# Patient Record
Sex: Male | Born: 1980
Health system: Southern US, Community
[De-identification: ages and names within clinical notes are randomized; demographics above are authoritative.]

## PROBLEM LIST (undated history)

## (undated) DIAGNOSIS — F909 Attention-deficit hyperactivity disorder, unspecified type: Secondary | ICD-10-CM

## (undated) DIAGNOSIS — E785 Hyperlipidemia, unspecified: Secondary | ICD-10-CM

## (undated) DIAGNOSIS — F410 Panic disorder [episodic paroxysmal anxiety] without agoraphobia: Secondary | ICD-10-CM

## (undated) DIAGNOSIS — I83813 Varicose veins of bilateral lower extremities with pain: Secondary | ICD-10-CM

## (undated) DIAGNOSIS — F32A Depression, unspecified: Secondary | ICD-10-CM

## (undated) DIAGNOSIS — E119 Type 2 diabetes mellitus without complications: Secondary | ICD-10-CM

## (undated) DIAGNOSIS — E669 Obesity, unspecified: Secondary | ICD-10-CM

## (undated) DIAGNOSIS — F319 Bipolar disorder, unspecified: Secondary | ICD-10-CM

## (undated) HISTORY — DX: Depression, unspecified: F32.A

## (undated) HISTORY — DX: Varicose veins of bilateral lower extremities with pain: I83.813

## (undated) HISTORY — DX: Type 2 diabetes mellitus without complications: E11.9

## (undated) HISTORY — DX: Hyperlipidemia, unspecified: E78.5

## (undated) HISTORY — PX: OTHER SURGICAL HISTORY: SHX169

---
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
## 1999-01-04 ENCOUNTER — Encounter: Admission: RE | Admit: 1999-01-04 | Discharge: 1999-04-04 | Payer: Self-pay

## 2001-07-16 ENCOUNTER — Emergency Department (HOSPITAL_COMMUNITY): Admission: EM | Admit: 2001-07-16 | Discharge: 2001-07-16 | Payer: Self-pay | Admitting: Emergency Medicine

## 2001-07-19 ENCOUNTER — Emergency Department (HOSPITAL_COMMUNITY): Admission: EM | Admit: 2001-07-19 | Discharge: 2001-07-19 | Payer: Self-pay | Admitting: Emergency Medicine

## 2002-05-29 ENCOUNTER — Emergency Department (HOSPITAL_COMMUNITY): Admission: EM | Admit: 2002-05-29 | Discharge: 2002-05-29 | Payer: Self-pay | Admitting: Emergency Medicine

## 2003-02-08 ENCOUNTER — Emergency Department (HOSPITAL_COMMUNITY): Admission: EM | Admit: 2003-02-08 | Discharge: 2003-02-08 | Payer: Self-pay | Admitting: Emergency Medicine

## 2003-06-30 ENCOUNTER — Emergency Department (HOSPITAL_COMMUNITY): Admission: EM | Admit: 2003-06-30 | Discharge: 2003-06-30 | Payer: Self-pay | Admitting: Emergency Medicine

## 2003-07-28 ENCOUNTER — Emergency Department (HOSPITAL_COMMUNITY): Admission: EM | Admit: 2003-07-28 | Discharge: 2003-07-28 | Payer: Self-pay | Admitting: Emergency Medicine

## 2003-09-18 ENCOUNTER — Emergency Department (HOSPITAL_COMMUNITY): Admission: AD | Admit: 2003-09-18 | Discharge: 2003-09-18 | Payer: Self-pay | Admitting: Family Medicine

## 2003-12-07 ENCOUNTER — Emergency Department (HOSPITAL_COMMUNITY): Admission: EM | Admit: 2003-12-07 | Discharge: 2003-12-07 | Payer: Self-pay | Admitting: Family Medicine

## 2004-12-03 ENCOUNTER — Emergency Department (HOSPITAL_COMMUNITY): Admission: EM | Admit: 2004-12-03 | Discharge: 2004-12-03 | Payer: Self-pay | Admitting: Family Medicine

## 2005-02-12 ENCOUNTER — Emergency Department (HOSPITAL_COMMUNITY): Admission: EM | Admit: 2005-02-12 | Discharge: 2005-02-12 | Payer: Self-pay | Admitting: Emergency Medicine

## 2006-05-31 ENCOUNTER — Emergency Department (HOSPITAL_COMMUNITY): Admission: EM | Admit: 2006-05-31 | Discharge: 2006-05-31 | Payer: Self-pay | Admitting: Emergency Medicine

## 2007-08-11 ENCOUNTER — Emergency Department (HOSPITAL_COMMUNITY): Admission: EM | Admit: 2007-08-11 | Discharge: 2007-08-11 | Payer: Self-pay | Admitting: Emergency Medicine

## 2010-02-02 ENCOUNTER — Emergency Department (HOSPITAL_COMMUNITY): Admission: EM | Admit: 2010-02-02 | Discharge: 2010-02-02 | Payer: Self-pay | Admitting: Emergency Medicine

## 2011-08-06 ENCOUNTER — Emergency Department (INDEPENDENT_AMBULATORY_CARE_PROVIDER_SITE_OTHER)
Admission: EM | Admit: 2011-08-06 | Discharge: 2011-08-06 | Disposition: A | Discharge status: Home / Self Care | Payer: Medicare Other | Source: Home / Self Care | Attending: Family Medicine | Admitting: Family Medicine

## 2011-08-06 DIAGNOSIS — M533 Sacrococcygeal disorders, not elsewhere classified: Secondary | ICD-10-CM

## 2011-08-06 DIAGNOSIS — S90129A Contusion of unspecified lesser toe(s) without damage to nail, initial encounter: Secondary | ICD-10-CM

## 2011-08-06 MED ORDER — PREDNISONE (PAK) 10 MG PO TABS: ORAL_TABLET | ORAL | Status: AC

## 2011-08-06 NOTE — ED Notes (Signed)
States he accidentally struck his 5th toe on furniture about 10 days ago, and it is not improving in spite of his home care efforts; also c/o pain in back for past yr after trying to lift a chair, flare up past 6 months

## 2011-08-06 NOTE — ED Provider Notes (Signed)
History     CSN: [id number] Arrival date & time: 08/06/2011  6:12 PM   First MD Initiated Contact with Patient 08/06/11 1846      Chief Complaint  Patient presents with  . Foot Pain    (Consider location/radiation/quality/duration/timing/severity/associated sxs/prior treatment) HPI Comments: Bradley [name] presents for evaluation after stubbing his RIGHT fifth toe on a piece of furniture several days ago; he is just concerned if it is infected or not. He also complains of chronic low back pain. Mostly on his LEFT side, after lifting a recliner a year ago. He states that the ibuprofen no longer helps.   Patient is a 30 y.o. male presenting with foot injury. The history is provided by the patient.  Foot Injury  The incident occurred 2 days ago. The incident occurred at home. The injury mechanism was a direct blow. The pain is present in the right toes. The quality of the pain is described as aching. The pain is mild. Pertinent negatives include no numbness and no tingling. The symptoms are aggravated by nothing.    History reviewed. No pertinent past medical history.  History reviewed. No pertinent past surgical history.  History reviewed. No pertinent family history.  History  Substance Use Topics  . Smoking status: Current Everyday Smoker -- 1.0 packs/day    Types: Cigarettes  . Smokeless tobacco: Not on file  . Alcohol Use: No      Review of Systems  Constitutional: Negative.   HENT: Negative.   Eyes: Negative.   Respiratory: Negative.   Gastrointestinal: Negative.   Musculoskeletal:       RIGHT fifth toe pain  Neurological: Negative for tingling and numbness.    Allergies  Review of patient's allergies indicates no known allergies.  Home Medications   Current Outpatient Rx  Name Route Sig Dispense Refill  . ATOMOXETINE HCL 10 MG PO CAPS Oral Take 10 mg by mouth daily.      . TOPIRAMATE 100 MG PO TABS Oral Take 100 mg by mouth 2 (two) times daily.        BP 118/70   Pulse 99  Temp(Src) 98 F (36.7 C) (Oral)  Resp 20  SpO2 100%  Physical Exam  Constitutional: He is oriented to person, place, and time. He appears well-developed and well-nourished.  HENT:  Head: Normocephalic and atraumatic.  Eyes: EOM are normal.  Neck: Normal range of motion.  Pulmonary/Chest: Effort normal and breath sounds normal.  Musculoskeletal:       Lumbar back: He exhibits tenderness and pain.       Right foot: He exhibits tenderness. He exhibits no bony tenderness, normal capillary refill and no laceration.       Feet:       Tenderness to palpation of RIGHT fifth digit on foot, with nail avulsion, but no signs of infection or deformity  Tenderness to palpation over LEFT SI joint  Neurological: He is alert and oriented to person, place, and time.  Skin: Skin is warm and dry.    ED Course  Procedures (including critical care time)  Labs Reviewed - No data to display No results found.   No diagnosis found.    MDM          Richardo Priest, MD 08/06/11 [phone]

## 2012-02-03 ENCOUNTER — Emergency Department (HOSPITAL_COMMUNITY)
Admission: EM | Admit: 2012-02-03 | Discharge: 2012-02-04 | Disposition: A | Discharge status: Home / Self Care | Payer: Medicare Other | Attending: Emergency Medicine | Admitting: Emergency Medicine

## 2012-02-03 ENCOUNTER — Encounter (HOSPITAL_COMMUNITY): Payer: Self-pay | Admitting: *Deleted

## 2012-02-03 DIAGNOSIS — M549 Dorsalgia, unspecified: Secondary | ICD-10-CM | POA: Diagnosis not present

## 2012-02-03 NOTE — ED Notes (Signed)
Back pain since Saturday am .  No known injury.  History of back problems

## 2012-02-04 ENCOUNTER — Encounter (HOSPITAL_COMMUNITY): Payer: Self-pay

## 2012-02-04 ENCOUNTER — Emergency Department (INDEPENDENT_AMBULATORY_CARE_PROVIDER_SITE_OTHER)
Admission: EM | Admit: 2012-02-04 | Discharge: 2012-02-04 | Disposition: A | Discharge status: Home / Self Care | Payer: Medicare Other | Source: Home / Self Care | Attending: Family Medicine | Admitting: Family Medicine

## 2012-02-04 DIAGNOSIS — S39012A Strain of muscle, fascia and tendon of lower back, initial encounter: Secondary | ICD-10-CM

## 2012-02-04 DIAGNOSIS — IMO0002 Reserved for concepts with insufficient information to code with codable children: Secondary | ICD-10-CM | POA: Diagnosis not present

## 2012-02-04 DIAGNOSIS — X58XXXA Exposure to other specified factors, initial encounter: Secondary | ICD-10-CM | POA: Diagnosis not present

## 2012-02-04 MED ORDER — IBUPROFEN 800 MG PO TABS: 800.0000 mg | ORAL_TABLET | Freq: Three times a day (TID) | ORAL | Status: AC

## 2012-02-04 MED ORDER — CYCLOBENZAPRINE HCL 10 MG PO TABS
10.0000 mg | ORAL_TABLET | Freq: Three times a day (TID) | ORAL | Status: AC | PRN
Start: 2012-02-04 — End: 2012-02-14

## 2012-02-04 NOTE — ED Notes (Signed)
Reports pain in mid back w radiation into scapular area and into knees; NAD; seen in main ED yesterday and LWBS after 2.5 H wait

## 2012-02-04 NOTE — ED Provider Notes (Signed)
History     CSN: [id number]  Arrival date & time 02/04/12  7829   First MD Initiated Contact with Patient 02/04/12 1959      Chief Complaint  Patient presents with  . Back Pain    (Consider location/radiation/quality/duration/timing/severity/associated sxs/prior treatment) Patient is a 31 y.o. male presenting with back pain. The history is provided by the patient.  Back Pain  This is a recurrent problem. The current episode started more than 2 days ago. The problem has been gradually worsening. Associated with: spasm getting oob on sat, similar sx prev. The pain is present in the lumbar spine. The quality of the pain is described as stabbing. The pain does not radiate. The pain is moderate. The symptoms are aggravated by certain positions. Pertinent negatives include no chest pain, no fever, no abdominal pain, no bowel incontinence, no bladder incontinence, no dysuria, no leg pain, no paresthesias, no tingling and no weakness. He has tried NSAIDs for the symptoms. The treatment provided mild relief. Risk factors include obesity and lack of exercise.    History reviewed. No pertinent past medical history.  History reviewed. No pertinent past surgical history.  History reviewed. No pertinent family history.  History  Substance Use Topics  . Smoking status: Current Everyday Smoker -- 1.0 packs/day    Types: Cigarettes  . Smokeless tobacco: Not on file  . Alcohol Use: No      Review of Systems  Constitutional: Negative for fever.  Cardiovascular: Negative for chest pain.  Gastrointestinal: Negative.  Negative for abdominal pain and bowel incontinence.  Genitourinary: Negative.  Negative for bladder incontinence and dysuria.  Musculoskeletal: Positive for back pain. Negative for gait problem.  Neurological: Negative for tingling, weakness and paresthesias.    Allergies  Review of patient's allergies indicates no known allergies.  Home Medications   Current Outpatient Rx    Name Route Sig Dispense Refill  . ACETAMINOPHEN 325 MG PO TABS Oral Take 650 mg by mouth every 8 (eight) hours as needed. For pain.    Marland Kitchen ATOMOXETINE HCL 10 MG PO CAPS Oral Take 10 mg by mouth daily.      . IBUPROFEN 200 MG PO TABS Oral Take 400 mg by mouth every 8 (eight) hours as needed. For pain    . TOPIRAMATE 100 MG PO TABS Oral Take 100 mg by mouth 2 (two) times daily.        BP 111/58  Pulse 102  Temp(Src) 97.6 F (36.4 C) (Oral)  Resp 24  SpO2 99%  Physical Exam  Nursing note and vitals reviewed. Constitutional: He is oriented to person, place, and time. He appears well-developed and well-nourished.  Abdominal: Bowel sounds are normal. There is no tenderness.  Musculoskeletal: He exhibits tenderness.       Arms: Neurological: He is alert and oriented to person, place, and time.  Skin: Skin is warm and dry.    ED Course  Procedures (including critical care time)  Labs Reviewed - No data to display No results found.   No diagnosis found.    MDM          Linna Hoff, MD 02/04/12 [phone]

## 2012-02-04 NOTE — Discharge Instructions (Signed)
Heat stretch medications as needed, exercise as tolerated.

## 2013-04-09 ENCOUNTER — Emergency Department (HOSPITAL_COMMUNITY)
Admission: EM | Admit: 2013-04-09 | Discharge: 2013-04-09 | Disposition: A | Discharge status: Home / Self Care | Payer: Medicare Other | Attending: Emergency Medicine | Admitting: Emergency Medicine

## 2013-04-09 ENCOUNTER — Encounter (HOSPITAL_COMMUNITY): Payer: Self-pay | Admitting: *Deleted

## 2013-04-09 ENCOUNTER — Emergency Department (INDEPENDENT_AMBULATORY_CARE_PROVIDER_SITE_OTHER)
Admission: EM | Admit: 2013-04-09 | Discharge: 2013-04-09 | Disposition: A | Discharge status: Nursing Home (Includes ICF) | Payer: Medicare Other | Source: Home / Self Care | Attending: Emergency Medicine | Admitting: Emergency Medicine

## 2013-04-09 DIAGNOSIS — R3589 Other polyuria: Secondary | ICD-10-CM

## 2013-04-09 DIAGNOSIS — R369 Urethral discharge, unspecified: Secondary | ICD-10-CM | POA: Diagnosis not present

## 2013-04-09 DIAGNOSIS — L03119 Cellulitis of unspecified part of limb: Secondary | ICD-10-CM | POA: Diagnosis not present

## 2013-04-09 DIAGNOSIS — E119 Type 2 diabetes mellitus without complications: Secondary | ICD-10-CM

## 2013-04-09 DIAGNOSIS — F909 Attention-deficit hyperactivity disorder, unspecified type: Secondary | ICD-10-CM | POA: Diagnosis not present

## 2013-04-09 DIAGNOSIS — R631 Polydipsia: Secondary | ICD-10-CM

## 2013-04-09 DIAGNOSIS — B3749 Other urogenital candidiasis: Secondary | ICD-10-CM | POA: Diagnosis not present

## 2013-04-09 DIAGNOSIS — E1169 Type 2 diabetes mellitus with other specified complication: Secondary | ICD-10-CM

## 2013-04-09 DIAGNOSIS — R000 Tachycardia, unspecified: Secondary | ICD-10-CM | POA: Insufficient documentation

## 2013-04-09 DIAGNOSIS — R350 Frequency of micturition: Secondary | ICD-10-CM | POA: Insufficient documentation

## 2013-04-09 DIAGNOSIS — B372 Candidiasis of skin and nail: Secondary | ICD-10-CM

## 2013-04-09 DIAGNOSIS — R7309 Other abnormal glucose: Secondary | ICD-10-CM

## 2013-04-09 DIAGNOSIS — L02419 Cutaneous abscess of limb, unspecified: Secondary | ICD-10-CM | POA: Diagnosis not present

## 2013-04-09 DIAGNOSIS — R5381 Other malaise: Secondary | ICD-10-CM | POA: Diagnosis not present

## 2013-04-09 DIAGNOSIS — L03116 Cellulitis of left lower limb: Secondary | ICD-10-CM

## 2013-04-09 DIAGNOSIS — F319 Bipolar disorder, unspecified: Secondary | ICD-10-CM | POA: Diagnosis not present

## 2013-04-09 DIAGNOSIS — R739 Hyperglycemia, unspecified: Secondary | ICD-10-CM

## 2013-04-09 DIAGNOSIS — Z79899 Other long term (current) drug therapy: Secondary | ICD-10-CM | POA: Insufficient documentation

## 2013-04-09 DIAGNOSIS — E669 Obesity, unspecified: Secondary | ICD-10-CM | POA: Insufficient documentation

## 2013-04-09 DIAGNOSIS — F17200 Nicotine dependence, unspecified, uncomplicated: Secondary | ICD-10-CM | POA: Diagnosis not present

## 2013-04-09 DIAGNOSIS — L0390 Cellulitis, unspecified: Secondary | ICD-10-CM

## 2013-04-09 DIAGNOSIS — R5383 Other fatigue: Secondary | ICD-10-CM | POA: Insufficient documentation

## 2013-04-09 DIAGNOSIS — R358 Other polyuria: Secondary | ICD-10-CM

## 2013-04-09 DIAGNOSIS — B3742 Candidal balanitis: Secondary | ICD-10-CM

## 2013-04-09 DIAGNOSIS — R300 Dysuria: Secondary | ICD-10-CM | POA: Insufficient documentation

## 2013-04-09 DIAGNOSIS — B356 Tinea cruris: Secondary | ICD-10-CM | POA: Insufficient documentation

## 2013-04-09 HISTORY — DX: Morbid (severe) obesity due to excess calories: E66.01

## 2013-04-09 HISTORY — DX: Attention-deficit hyperactivity disorder, unspecified type: F90.9

## 2013-04-09 HISTORY — DX: Bipolar disorder, unspecified: F31.9

## 2013-04-09 LAB — COMPREHENSIVE METABOLIC PANEL
ALT: 68 U/L — ABNORMAL HIGH (ref 0–53)
BUN: 7 mg/dL (ref 6–23)
CO2: 23 mEq/L (ref 19–32)
Calcium: 9.1 mg/dL (ref 8.4–10.5)
Creatinine, Ser: 0.61 mg/dL (ref 0.50–1.35)
GFR calc Af Amer: >90 mL/min (ref >90)
GFR calc non Af Amer: >90 mL/min (ref >90)
Glucose, Bld: 534 mg/dL — ABNORMAL HIGH (ref 70–99)
Sodium: 127 mEq/L — ABNORMAL LOW (ref 135–145)
Total Protein: 7.3 g/dL (ref 6.0–8.3)

## 2013-04-09 LAB — POCT URINALYSIS DIP (DEVICE)
Bilirubin Urine: NEGATIVE
Hgb urine dipstick: NEGATIVE
Leukocytes, UA: NEGATIVE
Nitrite: NEGATIVE
Protein, ur: NEGATIVE mg/dL
Urobilinogen, UA: 0.2 mg/dL (ref 0.0–1.0)
pH: 5 (ref 5.0–8.0)

## 2013-04-09 LAB — CBC
HCT: 45.5 % (ref 39.0–52.0)
Hemoglobin: 16.1 g/dL (ref 13.0–17.0)
MCHC: 35.4 g/dL (ref 30.0–36.0)
WBC: 8.4 10*3/uL (ref 4.0–10.5)

## 2013-04-09 LAB — URINALYSIS, ROUTINE W REFLEX MICROSCOPIC
Bilirubin Urine: NEGATIVE
Glucose, UA: >1000 mg/dL — AB
Hgb urine dipstick: NEGATIVE
Ketones, ur: NEGATIVE mg/dL
Protein, ur: NEGATIVE mg/dL
Urobilinogen, UA: 0.2 mg/dL (ref 0.0–1.0)

## 2013-04-09 LAB — URINE MICROSCOPIC-ADD ON

## 2013-04-09 LAB — GLUCOSE, CAPILLARY: Glucose-Capillary: 285 mg/dL — ABNORMAL HIGH (ref 70–99)

## 2013-04-09 MED ORDER — CLINDAMYCIN HCL 300 MG PO CAPS: 300.0000 mg | ORAL_CAPSULE | Freq: Four times a day (QID) | ORAL | Status: DC

## 2013-04-09 MED ORDER — INSULIN ASPART 100 UNIT/ML ~~LOC~~ SOLN
10.0000 [IU] | Freq: Once | SUBCUTANEOUS | Status: AC
  Administered 2013-04-09: 10 [IU] via INTRAVENOUS
  Filled 2013-04-09: qty 1

## 2013-04-09 MED ORDER — CLINDAMYCIN PHOSPHATE 600 MG/50ML IV SOLN
600.0000 mg | Freq: Once | INTRAVENOUS | Status: AC
  Administered 2013-04-09: 600 mg via INTRAVENOUS
  Filled 2013-04-09: qty 50

## 2013-04-09 MED ORDER — SODIUM CHLORIDE 0.9 % IV BOLUS (SEPSIS)
2000.0000 mL | Freq: Once | INTRAVENOUS | Status: AC
  Administered 2013-04-09: 2000 mL via INTRAVENOUS

## 2013-04-09 MED ORDER — CLOTRIMAZOLE 1 % EX CREA: TOPICAL_CREAM | CUTANEOUS | Status: DC

## 2013-04-09 MED ORDER — METFORMIN HCL 500 MG PO TABS: 500.0000 mg | ORAL_TABLET | Freq: Two times a day (BID) | ORAL | Status: DC

## 2013-04-09 NOTE — ED Notes (Signed)
Report called to Lupita Leash, ED First Nurse.

## 2013-04-09 NOTE — ED Notes (Signed)
The pt does not have ketones on his breath.  He has cellulitis of his rt thigh also.  Just diagnosed today

## 2013-04-09 NOTE — ED Provider Notes (Signed)
Medical screening examination/treatment/procedure(s) were performed by non-physician practitioner and as supervising physician I was immediately available for consultation/collaboration.  Nickalaus Crooke   Sharnell Knight, MD 04/09/13 2041 

## 2013-04-09 NOTE — ED Provider Notes (Signed)
History    CSN: [id number] Arrival date & time 04/09/13  1843  First MD Initiated Contact with Patient 04/09/13 1907     Chief Complaint  Patient presents with  . Penile Discharge  . Flank Pain  . Leg Pain   (Consider location/radiation/quality/duration/timing/severity/associated sxs/prior Treatment) HPI Comments: Morbidly obese 32 year old male presents with persistent itching in the groin as well as a scant penile discharge. Is also complaining of urinary frequency and dysuria. 4 days ago he noticed erythema to the right lower extremity below the knee in approximately 12 x 6 cm. It is exquisitely tender. Following that within 24 hours he developed a small annular erythematous nodule to his right lateral thigh. Over the past couple of days the erythematous as migrated in all directions of the right lateral and anterior proximal thigh. The area is exquisitely tender with underlying induration. Most of the erythema is cutaneous.  Past Medical History  Diagnosis Date  . Morbid obesity   . ADHD (attention deficit hyperactivity disorder)   . Bipolar disorder    Past Surgical History  Procedure Laterality Date  . Surgery on meatus as a child     No family history on file. History  Substance Use Topics  . Smoking status: Current Every Day Smoker -- 1.00 packs/day    Types: Cigarettes  . Smokeless tobacco: Not on file  . Alcohol Use: Yes     Comment: Rarely    Review of Systems  Constitutional: Negative.   HENT: Negative.   Respiratory: Positive for shortness of breath.        States has chronic, intermittent mild shortness of breath due to severe obesity and smoking.  Cardiovascular: Negative for chest pain.  Gastrointestinal: Negative.   Genitourinary:       See history of present illness  Neurological: Negative.     Allergies  Review of patient's allergies indicates no known allergies.  Home Medications   Current Outpatient Rx  Name  Route  Sig  Dispense  Refill   . atomoxetine (STRATTERA) 10 MG capsule   Oral   Take 10 mg by mouth daily.           Marland Kitchen topiramate (TOPAMAX) 100 MG tablet   Oral   Take 100 mg by mouth 2 (two) times daily.           Marland Kitchen acetaminophen (TYLENOL) 325 MG tablet   Oral   Take 650 mg by mouth every 8 (eight) hours as needed. For pain.         Marland Kitchen ibuprofen (ADVIL,MOTRIN) 200 MG tablet   Oral   Take 400 mg by mouth every 8 (eight) hours as needed. For pain          BP 140/87  Pulse 115  Temp(Src) 98.2 F (36.8 C) (Oral)  Resp 27  SpO2 96% Physical Exam  Nursing note and vitals reviewed. Constitutional: He is oriented to person, place, and time.  Severely obese.  Eyes: Conjunctivae and EOM are normal.  Neck: Normal range of motion. Neck supple.  Cardiovascular: Normal rate and regular rhythm.   Pulmonary/Chest: Effort normal and breath sounds normal. No respiratory distress.  Genitourinary: Penile tenderness present.  There is a red rash involving the penis and the foreskin. It also involves the scrotum and inguinal folds. The testicles descended and nontender. No tenderness in the upper dental apparatus.  Musculoskeletal: He exhibits tenderness.  Neurological: He is alert and oriented to person, place, and time.  Skin: Skin is warm  and dry. There is erythema.  Erythema with induration to the right proximal thigh as well as erythema without induration to the right lower extremity.    ED Course  Procedures (including critical care time) Labs Reviewed  GLUCOSE, CAPILLARY - Abnormal; Notable for the following:    Glucose-Capillary 557 (*)    All other components within normal limits  POCT URINALYSIS DIP (DEVICE) - Abnormal; Notable for the following:    Glucose, UA 500 (*)    All other components within normal limits   No results found. 1. Diabetes mellitus type 2 in obese   2. Hyperglycemia   3. Cellulitis of leg, left   4. Candidal balanitis   5. Candidal dermatitis   6. Polyuria   7. Polydipsia    8. Morbid obesity, BMI unknown     MDM  This patient has newly diagnosed diabetes with hyperglycemia. Also has cellulitis in 2 areas of the right lower extremity. He is being transferred to the emergency department primarily for these 2 reasons however he does have other diagnoses for this visit to include morbid obesity, Candida balanitis, polyuria, polydipsia and suspected electrolyte imbalance.     Hayden Rasmussen, NP 04/09/13 2000

## 2013-04-09 NOTE — ED Notes (Signed)
The pt was sent down here from [facility] with high blood sugar and a yeats infection.  He has been drinking  Gallons of liquid for a long time.  His bld sugar was 500 at [facility]

## 2013-04-09 NOTE — ED Notes (Signed)
C/O meatal irritation and pruritis x 1 month - felt he may initially have a UTI, so has been drinking cranberry juice; now c/o right flank pain x 1 wk while feeling "very hot", but unsure if fevers.  Decreased appetite per S.O.; c/o nausea.  C/O crusted yellow/brown discharge when scratching his penis.  C/O frequent urination and nocturia.  Also c/o very painful, red, large lump to right lateral thigh, and slightly tender discoloration to right medial lower leg.  Ambulating with limp.  Pt appears SOB - states this is occasionally normal for him with exertion.  Instructed on providing both a dirty and clean catch urine specimen.

## 2013-04-09 NOTE — ED Provider Notes (Signed)
History    CSN: [id number] Arrival date & time 04/09/13  2013  First MD Initiated Contact with Patient 04/09/13 2107     Chief Complaint  Patient presents with  . Hyperglycemia   (Consider location/radiation/quality/duration/timing/severity/associated sxs/prior Treatment) The history is provided by the patient.  Bradley [name] is a 32 y.o. male history of obesity, bipolar here presenting with cellulitis and urinary frequency for weakness. Urinary frequency for the last month as well as some dysuria. Also penile discharge but no history of STD and not sexually active. He also feels diffusely weak as well. Those noticed some white rash in his groin area. The last 4 days he noticed some erythema in his right thigh. Went to urgent care was diagnosed with yeast infection in his groin as well as cellulitis of his leg and had a CBG of 500 was sent in for evaluation for new onset diabetes. Denies fever chills or vomiting or abdominal pain.   Past Medical History  Diagnosis Date  . Morbid obesity   . ADHD (attention deficit hyperactivity disorder)   . Bipolar disorder    Past Surgical History  Procedure Laterality Date  . Surgery on meatus as a child     No family history on file. History  Substance Use Topics  . Smoking status: Current Every Day Smoker -- 1.00 packs/day    Types: Cigarettes  . Smokeless tobacco: Not on file  . Alcohol Use: Yes     Comment: Rarely    Review of Systems  Genitourinary: Positive for frequency and discharge.  Neurological: Positive for weakness.  All other systems reviewed and are negative.    Allergies  Review of patient's allergies indicates no known allergies.  Home Medications   Current Outpatient Rx  Name  Route  Sig  Dispense  Refill  . acetaminophen (TYLENOL) 325 MG tablet   Oral   Take 650 mg by mouth every 8 (eight) hours as needed. For pain.         Marland Kitchen atomoxetine (STRATTERA) 10 MG capsule   Oral   Take 10 mg by mouth at  bedtime.          . topiramate (TOPAMAX) 100 MG tablet   Oral   Take 100 mg by mouth 2 (two) times daily.           . traMADol (ULTRAM) 50 MG tablet   Oral   Take 50 mg by mouth every 6 (six) hours as needed for pain.          BP 149/94  Pulse 88  Temp(Src) 98.4 F (36.9 C)  Resp 14  SpO2 100% Physical Exam  Nursing note and vitals reviewed. Constitutional: He is oriented to person, place, and time.  Obese, NAD   HENT:  Head: Normocephalic.  Mouth/Throat: Oropharynx is clear and moist.  Eyes: Conjunctivae are normal. Pupils are equal, round, and reactive to light.  Neck: Normal range of motion. Neck supple.  Cardiovascular: Regular rhythm and normal heart sounds.   tachy  Pulmonary/Chest: Effort normal and breath sounds normal. No respiratory distress. He has no wheezes. He has no rales.  Abdominal: Soft. Bowel sounds are normal. He exhibits no distension. There is no tenderness. There is no rebound and no guarding.  Erythema involving penis and foreskin and below pannus, testicles nontender  Musculoskeletal: Normal range of motion.  Neurological: He is alert and oriented to person, place, and time.  Skin: Skin is warm and dry.  Erythema and warmth R  thigh area and R anterior shin. No fluctuance   Psychiatric: He has a normal mood and affect. His behavior is normal. Judgment and thought content normal.    ED Course  Procedures (including critical care time) Labs Reviewed  URINALYSIS, ROUTINE W REFLEX MICROSCOPIC - Abnormal; Notable for the following:    Color, Urine STRAW (*)    Specific Gravity, Urine 1.040 (*)    Glucose, UA >1000 (*)    All other components within normal limits  COMPREHENSIVE METABOLIC PANEL - Abnormal; Notable for the following:    Sodium 127 (*)    Chloride 94 (*)    Glucose, Bld 534 (*)    Albumin 3.4 (*)    AST 60 (*)    ALT 68 (*)    All other components within normal limits  CBC - Abnormal; Notable for the following:    Platelets  121 (*)    All other components within normal limits  URINE MICROSCOPIC-ADD ON - Abnormal; Notable for the following:    Squamous Epithelial / LPF FEW (*)    All other components within normal limits  GLUCOSE, CAPILLARY - Abnormal; Notable for the following:    Glucose-Capillary 285 (*)    All other components within normal limits   No results found. No diagnosis found.  MDM  Bradley [name] is a 32 y.o. male here with new onset diabetes with yeast infection and R leg cellulitis. Will get labs and control blood sugar. Will get UA to r/o infection.   11:11 PM UA showed no UTI. CBG 285 on d/c after IVf and insulin. No AG on CMP. Will d/c on metformin and clinda and clotrimazole.    Bradley Canal, MD 04/09/13 [phone]

## 2013-04-12 LAB — POCT URINALYSIS DIP (DEVICE)
Glucose, UA: 500 mg/dL — AB
Hgb urine dipstick: NEGATIVE
Nitrite: NEGATIVE
Protein, ur: NEGATIVE mg/dL
Specific Gravity, Urine: <=1.005 (ref 1.005–1.030)
Urobilinogen, UA: 0.2 mg/dL (ref 0.0–1.0)

## 2013-04-26 ENCOUNTER — Ambulatory Visit: Payer: Medicare Other | Attending: Family Medicine | Admitting: Internal Medicine

## 2013-04-26 ENCOUNTER — Encounter: Payer: Self-pay | Admitting: Family Medicine

## 2013-04-26 VITALS — BP 156/89 | HR 98 | Temp 97.8°F | Resp 17 | Wt 393.8 lb

## 2013-04-26 DIAGNOSIS — E119 Type 2 diabetes mellitus without complications: Secondary | ICD-10-CM | POA: Diagnosis not present

## 2013-04-26 DIAGNOSIS — E1310 Other specified diabetes mellitus with ketoacidosis without coma: Secondary | ICD-10-CM

## 2013-04-26 DIAGNOSIS — I10 Essential (primary) hypertension: Secondary | ICD-10-CM | POA: Diagnosis not present

## 2013-04-26 DIAGNOSIS — F319 Bipolar disorder, unspecified: Secondary | ICD-10-CM | POA: Diagnosis not present

## 2013-04-26 DIAGNOSIS — E1110 Type 2 diabetes mellitus with ketoacidosis without coma: Secondary | ICD-10-CM

## 2013-04-26 LAB — GLUCOSE, POCT (MANUAL RESULT ENTRY): POC Glucose: 169 mg/dl — AB (ref 70–99)

## 2013-04-26 LAB — CBC
HCT: 45 % (ref 39.0–52.0)
Hemoglobin: 15.6 g/dL (ref 13.0–17.0)
MCH: 28.2 pg (ref 26.0–34.0)
MCHC: 34.7 g/dL (ref 30.0–36.0)
RDW: 13.9 % (ref 11.5–15.5)

## 2013-04-26 MED ORDER — LISINOPRIL 10 MG PO TABS: 10.0000 mg | ORAL_TABLET | Freq: Every day | ORAL | Status: DC

## 2013-04-26 MED ORDER — FREESTYLE SYSTEM KIT: 1.0000 | PACK | Freq: Three times a day (TID) | Status: DC

## 2013-04-26 MED ORDER — METFORMIN HCL 1000 MG PO TABS: 1000.0000 mg | ORAL_TABLET | Freq: Two times a day (BID) | ORAL | Status: DC

## 2013-04-26 NOTE — Patient Instructions (Signed)
Accuchecks 4 times/day, Once in AM empty stomach and then before each meal. Log in all results and show them to your Prim.MD in 3 days. If any glucose reading is under 80 or above 300 call your Prim MD immidiately. Follow Low glucose instructions for glucose under 80 as instructed.   Exercise 30 minutes a day 5-6 times a week. Come back in a month with your glucose check logbook

## 2013-04-26 NOTE — Progress Notes (Signed)
Patient here to establish care Recently diagnosed with DM

## 2013-04-26 NOTE — Progress Notes (Signed)
Patient ID: LAVERT MATOUSEK, male   DOB: [birth year], 33 y.o.   MRN: [id number] Patient Demographics  Wesson Stith, is a 32 y.o. male  CSN: [id number]  MRN: [id number]  DOB - April 30, 1981  Outpatient Primary MD for the patient is Standley Dakins, MD   With History of -  Past Medical History  Diagnosis Date  . Morbid obesity   . ADHD (attention deficit hyperactivity disorder)   . Bipolar disorder   . Diabetes mellitus without complication       Past Surgical History  Procedure Laterality Date  . Surgery on meatus as a child      in for   Chief Complaint  Patient presents with  . Diabetes     HPI  Hiroki Wint  is a 32 y.o. male, history of bipolar disorder, ADHD, morbid obesity was recently diagnosed with type 2 diabetes mellitus and was started on Glucophage few weeks ago comes here to establish care. He currently has no subjective complaints, he has lost about 30 pounds of weight, he is been exercising on regular basis since his new diagnosis of diabetes mellitus. His mental issues are stable he follows with her local mental health clinic on a regular basis.    Review of Systems    In addition to the HPI above,   No Fever-chills, No Headache, No changes with Vision or hearing, No problems swallowing food or Liquids, No Chest pain, Cough or Shortness of Breath, No Abdominal pain, No Nausea or Vommitting, Bowel movements are regular, No Blood in stool or Urine, No dysuria, No new skin rashes or bruises, No new joints pains-aches,  No new weakness, tingling, numbness in any extremity, No recent weight gain or loss, No polyuria, polydypsia or polyphagia, No significant Mental Stressors.  A full 10 point Review of Systems was done, except as stated above, all other Review of Systems were negative.   Social History History  Substance Use Topics  . Smoking status: Current Every Day Smoker -- 1.00 packs/day    Types: Cigarettes  . Smokeless tobacco: Not on  file  . Alcohol Use: Yes     Comment: Rarely      Family History Type 2 diabetes mellitus in his father  Prior to Admission medications   Medication Sig Start Date End Date Taking? Authorizing Provider  acetaminophen (TYLENOL) 325 MG tablet Take 650 mg by mouth every 8 (eight) hours as needed. For pain.    Historical Provider, MD  atomoxetine (STRATTERA) 10 MG capsule Take 10 mg by mouth at bedtime.     Historical Provider, MD  clotrimazole (LOTRIMIN) 1 % cream Apply to affected area 2 times daily 04/09/13   Richardean Canal, MD  glucose monitoring kit (FREESTYLE) monitoring kit 1 each by Does not apply route 4 (four) times daily - after meals and at bedtime. 1 month Diabetic Testing Supplies for QAC-QHS accuchecks. 04/26/13   Leroy Sea, MD  metFORMIN (GLUCOPHAGE) 500 MG tablet Take 1 tablet (500 mg total) by mouth 2 (two) times daily with a meal. 04/09/13   Richardean Canal, MD  topiramate (TOPAMAX) 100 MG tablet Take 100 mg by mouth 2 (two) times daily.      Historical Provider, MD  traMADol (ULTRAM) 50 MG tablet Take 50 mg by mouth every 6 (six) hours as needed for pain.    Historical Provider, MD    No Known Allergies  Physical Exam  Vitals  Blood pressure 156/89, pulse 98, temperature 97.8 F (36.6  C), resp. rate 17, weight 393 lb 12.8 oz (178.627 kg), SpO2 99.00%.   1. General Young morbidly obese white male sitting on clinic examination table in no apparent distress,   2. Normal affect and insight, Not Suicidal or Homicidal, Awake Alert, Oriented X 3.  3. No F.N deficits, ALL C.Nerves Intact, Strength 5/5 all 4 extremities, Sensation intact all 4 extremities, Plantars down going.  4. Ears and Eyes appear Normal, Conjunctivae clear, PERRLA. Moist Oral Mucosa.  5. Supple Neck, No JVD, No cervical lymphadenopathy appriciated, No Carotid Bruits.  6. Symmetrical Chest wall movement, Good air movement bilaterally, CTAB.  7. RRR, No Gallops, Rubs or Murmurs, No Parasternal  Heave.  8. Positive Bowel Sounds, Abdomen Soft, Non tender, No organomegaly appriciated,No rebound -guarding or rigidity.  9.  No Cyanosis, Normal Skin Turgor, No Skin Rash or Bruise.  10. Good muscle tone,  joints appear normal , no effusions, Normal ROM.  11. No Palpable Lymph Nodes in Neck or Axillae     Data Review  CBC No results found for this basename: WBC, HGB, HCT, PLT, MCV, MCH, MCHC, RDW, NEUTRABS, LYMPHSABS, MONOABS, EOSABS, BASOSABS, BANDABS, BANDSABD,  in the last 168 hours ------------------------------------------------------------------------------------------------------------------  Chemistries  No results found for this basename: NA, K, CL, CO2, GLUCOSE, BUN, CREATININE, GFRCGP, CALCIUM, MG, AST, ALT, ALKPHOS, BILITOT,  in the last 168 hours ------------------------------------------------------------------------------------------------------------------ CrCl is unknown because there is no height on file for the current visit. ------------------------------------------------------------------------------------------------------------------ No results found for this basename: TSH, T4TOTAL, FREET3, T3FREE, THYROIDAB,  in the last 72 hours   Coagulation profile No results found for this basename: INR, PROTIME,  in the last 168 hours ------------------------------------------------------------------------------------------------------------------- No results found for this basename: DDIMER,  in the last 72 hours -------------------------------------------------------------------------------------------------------------------  Cardiac Enzymes No results found for this basename: CK, CKMB, TROPONINI, MYOGLOBIN,  in the last 168 hours ------------------------------------------------------------------------------------------------------------------ No components found with this basename: POCBNP,     ---------------------------------------------------------------------------------------------------------------    Assessment and plan.   Diabetes mellitus type 2. Recently diagnosed, A1c is 10.1, he's been started on Glucophage few weeks ago, he's taking his sugars 3 times a week and they're running between 120- 160, I will increase his Glucophage to thousand twice a day, have given him testing supplies and requested him to continue checking his sugars a.c. At bedtime and bring his logbook next visit in a month. We'll refer him to ophthalmologist for eye exam.     Hypertension. We'll start him on low-dose lisinopril. He will come back in a month for followup.    Morbid obesity. Counseled on diet and exercise.     Bipolar disorder, ADHD. Continue home medications and close followup with his primary psychiatrist which [facility].      Routine health maintenance.  Screening labs. CBC, CMP, TSH, A1c, lipid panel    Immunizations tetanus shot next visit, clinic is out of tetanus shot        Leroy Sea M.D on 04/26/2013 at 11:29 AM

## 2013-04-27 LAB — COMPLETE METABOLIC PANEL WITH GFR
AST: 32 U/L (ref 0–37)
Alkaline Phosphatase: 64 U/L (ref 39–117)
BUN: 7 mg/dL (ref 6–23)
Creat: 0.65 mg/dL (ref 0.50–1.35)
GFR, Est Non African American: >89 mL/min
Glucose, Bld: 174 mg/dL — ABNORMAL HIGH (ref 70–99)
Total Bilirubin: 0.8 mg/dL (ref 0.3–1.2)

## 2013-04-27 LAB — TSH: TSH: 2.153 u[IU]/mL (ref 0.350–4.500)

## 2013-04-28 ENCOUNTER — Other Ambulatory Visit: Payer: Medicare Other

## 2013-04-29 ENCOUNTER — Ambulatory Visit: Payer: Medicare Other | Attending: Family Medicine

## 2013-04-29 DIAGNOSIS — E785 Hyperlipidemia, unspecified: Secondary | ICD-10-CM

## 2013-04-29 LAB — LIPID PANEL
Cholesterol: 106 mg/dL (ref 0–200)
HDL: 34 mg/dL — ABNORMAL LOW (ref >39)
Triglycerides: 94 mg/dL (ref <150)

## 2013-05-01 NOTE — Progress Notes (Signed)
Quick Note:  Please inform patient that lipid panel came back OK exept that HDL cholesterol is on the low side. Pt can raise HDL levels with regular physical exercise and physical activities.   Rodney Langton, MD, CDE, FAAFP [facility] [place], [place]   ______

## 2013-05-02 ENCOUNTER — Telehealth: Payer: Self-pay | Admitting: *Deleted

## 2013-05-02 NOTE — Telephone Encounter (Signed)
05/02/13 Spoke with patient and made aware that lipid panel came back OK except that HDL cholesterol is on the low side. Patient  Instructed to start regular physical exercise  and physical activities to raise HDL  Per Dr. Laural Benes. P.Manpreet Kemmer,RN BSN [facility]

## 2013-05-12 ENCOUNTER — Telehealth: Payer: Self-pay | Admitting: Family Medicine

## 2013-05-12 NOTE — Telephone Encounter (Signed)
Pt was here on 04/26/13 and says forgot to mention he needs freestyle meter and test strips. Please f/u with pt.

## 2013-05-27 ENCOUNTER — Ambulatory Visit: Payer: Medicare Other | Attending: Internal Medicine | Admitting: Internal Medicine

## 2013-05-27 VITALS — BP 144/91 | HR 78 | Temp 97.8°F | Resp 16 | Wt 378.0 lb

## 2013-05-27 DIAGNOSIS — E119 Type 2 diabetes mellitus without complications: Secondary | ICD-10-CM | POA: Diagnosis not present

## 2013-05-27 NOTE — Patient Instructions (Signed)

## 2013-05-27 NOTE — Progress Notes (Signed)
Patient ID: Bradley [name], male   DOB: [birth year], 32 y.o.   MRN: [id number]   CC: Regular followup  HPI: Patient is 32 year old male with morbid obesity, diabetes that was newly diagnosed a few months ago. He is here for followup, he reports losing over 50 pounds in the past several months by changing his diet to smaller portions and exercising. He denies chest pain or shortness of breath, he reports checking sugar levels regularly and the numbers are from 130 to 170. He continues taking metformin as prescribed. He also reports checking blood pressure regularly and the numbers are less than 130/80.  No Known Allergies Past Medical History  Diagnosis Date  . Morbid obesity   . ADHD (attention deficit hyperactivity disorder)   . Bipolar disorder   . Diabetes mellitus without complication    Current Outpatient Prescriptions on File Prior to Visit  Medication Sig Dispense Refill  . acetaminophen (TYLENOL) 325 MG tablet Take 650 mg by mouth every 8 (eight) hours as needed. For pain.      Marland Kitchen atomoxetine (STRATTERA) 10 MG capsule Take 10 mg by mouth at bedtime.       . clotrimazole (LOTRIMIN) 1 % cream Apply to affected area 2 times daily  15 g  0  . glucose monitoring kit (FREESTYLE) monitoring kit 1 each by Does not apply route 4 (four) times daily - after meals and at bedtime. 1 month Diabetic Testing Supplies for QAC-QHS accuchecks.  1 each  1  . lisinopril (PRINIVIL,ZESTRIL) 10 MG tablet Take 1 tablet (10 mg total) by mouth daily.  30 tablet  0  . metFORMIN (GLUCOPHAGE) 1000 MG tablet Take 1 tablet (1,000 mg total) by mouth 2 (two) times daily with a meal.  60 tablet  3  . topiramate (TOPAMAX) 100 MG tablet Take 100 mg by mouth 2 (two) times daily.        . traMADol (ULTRAM) 50 MG tablet Take 50 mg by mouth every 6 (six) hours as needed for pain.       No current facility-administered medications on file prior to visit.   Family history of diabetes History   Social History  . Marital  Status: Single    Spouse Name: N/A    Number of Children: N/A  . Years of Education: N/A   Occupational History  . Not on file.   Social History Main Topics  . Smoking status: Current Every Day Smoker -- 1.00 packs/day    Types: Cigarettes  . Smokeless tobacco: Not on file  . Alcohol Use: Yes     Comment: Rarely  . Drug Use: No  . Sexual Activity: Not on file   Other Topics Concern  . Not on file   Social History Narrative  . No narrative on file    Review of Systems  Constitutional: Negative for fever, chills, diaphoresis, activity change, appetite change and fatigue.  HENT: Negative for ear pain, nosebleeds, congestion, facial swelling, rhinorrhea, neck pain, neck stiffness and ear discharge.   Eyes: Negative for pain, discharge, redness, itching and visual disturbance.  Respiratory: Negative for cough, choking, chest tightness, shortness of breath, wheezing and stridor.   Cardiovascular: Negative for chest pain, palpitations and leg swelling.  Gastrointestinal: Negative for abdominal distention.  Genitourinary: Negative for dysuria, urgency, frequency, hematuria, flank pain, decreased urine volume, difficulty urinating and dyspareunia.  Musculoskeletal: Negative for back pain, joint swelling, arthralgias and gait problem.  Neurological: Negative for dizziness, tremors, seizures, syncope, facial asymmetry, speech difficulty,  weakness, light-headedness, numbness and headaches.  Hematological: Negative for adenopathy. Does not bruise/bleed easily.  Psychiatric/Behavioral: Negative for hallucinations, behavioral problems, confusion, dysphoric mood, decreased concentration and agitation.    Objective:   Filed Vitals:   05/27/13 1016  BP: 144/91  Pulse: 78  Temp: 97.8 F (36.6 C)  Resp: 16    Physical Exam  Constitutional: Appears well-developed and well-nourished. No distress.  Neck: Normal ROM. Neck supple. No JVD. No tracheal deviation. No thyromegaly.  CVS: RRR,  S1/S2 +, no murmurs, no gallops, no carotid bruit.  Pulmonary: Effort and breath sounds normal, no stridor, rhonchi, wheezes, rales.  Abdominal: Soft. BS +,  no distension, tenderness, rebound or guarding.   Lab Results  Component Value Date   WBC 8.0 04/26/2013   HGB 15.6 04/26/2013   HCT 45.0 04/26/2013   MCV 81.2 04/26/2013   PLT 138* 04/26/2013   Lab Results  Component Value Date   CREATININE 0.65 04/26/2013   BUN 7 04/26/2013   NA 141 04/26/2013   K 4.1 04/26/2013   CL 102 04/26/2013   CO2 28 04/26/2013    Lab Results  Component Value Date   HGBA1C 10.5% 04/26/2013   Lipid Panel     Component Value Date/Time   CHOL 106 04/29/2013 0915   TRIG 94 04/29/2013 0915   HDL 34* 04/29/2013 0915   CHOLHDL 3.1 04/29/2013 0915   VLDL 19 04/29/2013 0915   LDLCALC 53 04/29/2013 0915       Assessment and plan:   Patient Active Problem List   Diagnosis Date Noted  . DM2 (diabetes mellitus, type 2) - I have advised patient to bring the log book to the next visit, next A1c scheduled for 07/27/2013. We have discussed significance of A1c and continuing compliance with recommended diet and exercise. I have also advised patient to continue checking his sugar levels regularly and to perform daily feet examination and to call us back if he notices any changes, ulcers or wounds.  04/26/2013

## 2013-05-27 NOTE — Progress Notes (Signed)
Patient here for follow up appt-DM

## 2013-07-27 ENCOUNTER — Ambulatory Visit: Payer: Medicare Other | Attending: Internal Medicine | Admitting: Internal Medicine

## 2013-07-27 VITALS — BP 119/79 | HR 92 | Temp 98.0°F | Resp 68 | Wt 370.0 lb

## 2013-07-27 DIAGNOSIS — I10 Essential (primary) hypertension: Secondary | ICD-10-CM | POA: Insufficient documentation

## 2013-07-27 DIAGNOSIS — F3162 Bipolar disorder, current episode mixed, moderate: Secondary | ICD-10-CM | POA: Insufficient documentation

## 2013-07-27 DIAGNOSIS — E119 Type 2 diabetes mellitus without complications: Secondary | ICD-10-CM

## 2013-07-27 LAB — POCT GLYCOSYLATED HEMOGLOBIN (HGB A1C): Hemoglobin A1C: 6

## 2013-07-27 MED ORDER — METFORMIN HCL 1000 MG PO TABS: 1000.0000 mg | ORAL_TABLET | Freq: Two times a day (BID) | ORAL | Status: DC

## 2013-07-27 MED ORDER — FREESTYLE SYSTEM KIT: 1.0000 | PACK | Freq: Three times a day (TID) | Status: DC

## 2013-07-27 NOTE — Progress Notes (Unsigned)
Patient here for follow up on DM.

## 2013-07-27 NOTE — Progress Notes (Unsigned)
Patient ID: Bradley [name], male   DOB: 24-Sep-1981, 32 y.o.   MRN: [id number]  Patient Demographics  Bradley [name], is a 32 y.o. male  WUJ:[id number]  [id number]  DOB - 1981/09/27  Chief Complaint  Patient presents with  . Follow-up        Subjective:   Bradley [name] with History of type 2 diabetes mellitus who is on Glucophage is here for routine followup visit, he has no subjective complaints, no headache no chest pain no belly pain, no cough shortness of breath. No focal weakness.  Denies any subjective complaints except as above, no active headache, no chest abdominal pain at this time, not short of breath. No focal weakness which is new.    Objective:    Patient Active Problem List   Diagnosis Date Noted  . HTN (hypertension) 07/27/2013  . Morbid obesity 07/27/2013  . Bipolar 1 disorder, mixed, moderate 07/27/2013  . DM2 (diabetes mellitus, type 2) 04/26/2013     Filed Vitals:   07/27/13 1628  BP: 119/79  Pulse: 92  Temp: 98 F (36.7 C)  Resp: 68  Weight: 370 lb (167.831 kg)  SpO2: 100%     Exam   Awake Alert, Oriented X 3, No new F.N deficits, Normal affect Haring.AT,PERRAL Supple Neck,No JVD, No cervical lymphadenopathy appriciated.  Symmetrical Chest wall movement, Good air movement bilaterally, CTAB RRR,No Gallops,Rubs or new Murmurs, No Parasternal Heave +ve B.Sounds, Abd Soft, Non tender, No organomegaly appriciated, No rebound - guarding or rigidity. No Cyanosis, Clubbing or edema, No new Rash or bruise       Data Review   Lab Results  Component Value Date   WBC 8.0 04/26/2013   HGB 15.6 04/26/2013   HCT 45.0 04/26/2013   MCV 81.2 04/26/2013   PLT 138* 04/26/2013      Chemistry      Component Value Date/Time   NA 141 04/26/2013 1114   K 4.1 04/26/2013 1114   CL 102 04/26/2013 1114   CO2 28 04/26/2013 1114   BUN 7 04/26/2013 1114   CREATININE 0.65 04/26/2013 1114   CREATININE 0.61 04/09/2013 2030      Component Value Date/Time   CALCIUM 9.1 04/26/2013 1114   ALKPHOS 64 04/26/2013 1114   AST 32 04/26/2013 1114   ALT 40 04/26/2013 1114   BILITOT 0.8 04/26/2013 1114       Lab Results  Component Value Date   HGBA1C 10.5% 04/26/2013    Lab Results  Component Value Date   CHOL 106 04/29/2013   HDL 34* 04/29/2013   LDLCALC 53 04/29/2013   TRIG 94 04/29/2013   CHOLHDL 3.1 04/29/2013    Lab Results  Component Value Date   TSH 2.153 04/26/2013    No results found for this basename: PSA      Prior to Admission medications   Medication Sig Start Date End Date Taking? Authorizing Provider  acetaminophen (TYLENOL) 325 MG tablet Take 650 mg by mouth every 8 (eight) hours as needed. For pain.    Historical Provider, MD  atomoxetine (STRATTERA) 10 MG capsule Take 10 mg by mouth at bedtime.     Historical Provider, MD  clotrimazole (LOTRIMIN) 1 % cream Apply to affected area 2 times daily 04/09/13   Richardean Canal, MD  glucose monitoring kit (FREESTYLE) monitoring kit 1 each by Does not apply route 4 (four) times daily - after meals and at bedtime. 1 month Diabetic Testing Supplies for QAC-QHS accuchecks. 04/26/13   Kataleyah Carducci K  Thedore Mins, MD  lisinopril (PRINIVIL,ZESTRIL) 10 MG tablet Take 1 tablet (10 mg total) by mouth daily. 04/26/13   Leroy Sea, MD  metFORMIN (GLUCOPHAGE) 1000 MG tablet Take 1 tablet (1,000 mg total) by mouth 2 (two) times daily with a meal. 04/26/13   Leroy Sea, MD  topiramate (TOPAMAX) 100 MG tablet Take 100 mg by mouth 2 (two) times daily.      Historical Provider, MD  traMADol (ULTRAM) 50 MG tablet Take 50 mg by mouth every 6 (six) hours as needed for pain.    Historical Provider, MD     Assessment & Plan    Diabetes mellitus type 2. Glycemic control is stable on Glucophage, currently CBG is 102,   Hypertension stable on present dose lisinopril   Morbid obesity. Counseled on diet and exercise   Bipolar disorder stable on present regimen continue. Not suicidal homicidal.     Routine health  maintenance.  Is up-to-date on flu and tetanus shot per patient   Leroy Sea M.D on 07/27/2013 at 5:21 PM

## 2013-10-03 ENCOUNTER — Ambulatory Visit: Payer: Medicare Other | Attending: Internal Medicine | Admitting: Internal Medicine

## 2013-10-03 ENCOUNTER — Encounter: Payer: Self-pay | Admitting: Internal Medicine

## 2013-10-03 VITALS — BP 142/81 | HR 105 | Temp 98.0°F | Resp 18 | Ht 72.0 in | Wt 379.0 lb

## 2013-10-03 DIAGNOSIS — E119 Type 2 diabetes mellitus without complications: Secondary | ICD-10-CM

## 2013-10-03 LAB — POCT GLYCOSYLATED HEMOGLOBIN (HGB A1C): HEMOGLOBIN A1C: 6.2

## 2013-10-03 MED ORDER — LISINOPRIL 10 MG PO TABS: 10.0000 mg | ORAL_TABLET | Freq: Every day | ORAL | Status: DC

## 2013-10-03 MED ORDER — METFORMIN HCL 1000 MG PO TABS: 1000.0000 mg | ORAL_TABLET | Freq: Two times a day (BID) | ORAL | Status: DC

## 2013-10-03 MED ORDER — HYDROCHLOROTHIAZIDE 25 MG PO TABS: 25.0000 mg | ORAL_TABLET | Freq: Every day | ORAL | Status: DC

## 2013-10-03 NOTE — Progress Notes (Unsigned)
Pt here to f/u diabetes with oral medication Need refill Metformin Not taking bp med since June 2014 due to side effects Bp 142/81 105  CBG this am-146

## 2013-10-03 NOTE — Progress Notes (Unsigned)
Patient ID: Bradley [name], male   DOB: May 08, 1981, 33 y.o.   MRN: [id number]   CC:  HPI:  33 year old male here for followup of his type 2 diabetes, hypertension. The patient has not taken his lisinopril since July. It just made him feel abnormal no specific complaints. Her CBGs have been running from 100 -180. No other focal symptoms or complaints . He is on disability for mental health issues He has gained about 10 pounds and the holidays  No Known Allergies Past Medical History  Diagnosis Date  . Morbid obesity   . ADHD (attention deficit hyperactivity disorder)   . Bipolar disorder   . Diabetes mellitus without complication    Current Outpatient Prescriptions on File Prior to Visit  Medication Sig Dispense Refill  . topiramate (TOPAMAX) 100 MG tablet Take 100 mg by mouth 2 (two) times daily.        . traMADol (ULTRAM) 50 MG tablet Take 50 mg by mouth every 6 (six) hours as needed for pain.      Marland Kitchen acetaminophen (TYLENOL) 325 MG tablet Take 650 mg by mouth every 8 (eight) hours as needed. For pain.      Marland Kitchen atomoxetine (STRATTERA) 10 MG capsule Take 10 mg by mouth at bedtime.       . clotrimazole (LOTRIMIN) 1 % cream Apply to affected area 2 times daily  15 g  0  . glucose monitoring kit (FREESTYLE) monitoring kit 1 each by Does not apply route 4 (four) times daily - after meals and at bedtime. 1 month Diabetic Testing Supplies for QAC-QHS accuchecks.  1 each  1   No current facility-administered medications on file prior to visit.   History reviewed. No pertinent family history. History   Social History  . Marital Status: Single    Spouse Name: N/A    Number of Children: N/A  . Years of Education: N/A   Occupational History  . Not on file.   Social History Main Topics  . Smoking status: Current Every Day Smoker -- 1.00 packs/day    Types: Cigarettes  . Smokeless tobacco: Not on file  . Alcohol Use: Yes     Comment: Rarely  . Drug Use: No  . Sexual Activity: Not on  file   Other Topics Concern  . Not on file   Social History Narrative  . No narrative on file    Review of Systems  Constitutional: As in history of present illness HENT: Negative for ear pain, nosebleeds, congestion, facial swelling, rhinorrhea, neck pain, neck stiffness and ear discharge.   Eyes: Negative for pain, discharge, redness, itching and visual disturbance.  Respiratory: Negative for cough, choking, chest tightness, shortness of breath, wheezing and stridor.   Cardiovascular: Negative for chest pain, palpitations and leg swelling.  Gastrointestinal: Negative for abdominal distention.  Genitourinary: Negative for dysuria, urgency, frequency, hematuria, flank pain, decreased urine volume, difficulty urinating and dyspareunia.  Musculoskeletal: Negative for back pain, joint swelling, arthralgias and gait problem.  Neurological: Negative for dizziness, tremors, seizures, syncope, facial asymmetry, speech difficulty, weakness, light-headedness, numbness and headaches.  Hematological: Negative for adenopathy. Does not bruise/bleed easily.  Psychiatric/Behavioral: Negative for hallucinations, behavioral problems, confusion, dysphoric mood, decreased concentration and agitation.    Objective:   Filed Vitals:   10/03/13 1745  BP: 142/81  Pulse: 105  Temp: 98 F (36.7 C)  Resp: 18    Physical Exam  Constitutional: Appears well-developed and well-nourished. No distress.  HENT: Normocephalic. External right and left  ear normal. Oropharynx is clear and moist.  Eyes: Conjunctivae and EOM are normal. PERRLA, no scleral icterus.  Neck: Normal ROM. Neck supple. No JVD. No tracheal deviation. No thyromegaly.  CVS: RRR, S1/S2 +, no murmurs, no gallops, no carotid bruit.  Pulmonary: Effort and breath sounds normal, no stridor, rhonchi, wheezes, rales.  Abdominal: Soft. BS +,  no distension, tenderness, rebound or guarding.  Musculoskeletal: Normal range of motion. No edema and no  tenderness.  Lymphadenopathy: No lymphadenopathy noted, cervical, inguinal. Neuro: Alert. Normal reflexes, muscle tone coordination. No cranial nerve deficit. Skin: Skin is warm and dry. No rash noted. Not diaphoretic. No erythema. No pallor.  Psychiatric: Normal mood and affect. Behavior, judgment, thought content normal.   Lab Results  Component Value Date   WBC 8.0 04/26/2013   HGB 15.6 04/26/2013   HCT 45.0 04/26/2013   MCV 81.2 04/26/2013   PLT 138* 04/26/2013   Lab Results  Component Value Date   CREATININE 0.65 04/26/2013   BUN 7 04/26/2013   NA 141 04/26/2013   K 4.1 04/26/2013   CL 102 04/26/2013   CO2 28 04/26/2013    Lab Results  Component Value Date   HGBA1C 6.0 07/27/2013   Lipid Panel     Component Value Date/Time   CHOL 106 04/29/2013 0915   TRIG 94 04/29/2013 0915   HDL 34* 04/29/2013 0915   CHOLHDL 3.1 04/29/2013 0915   VLDL 19 04/29/2013 0915   LDLCALC 53 04/29/2013 0915       Assessment and plan:   Patient Active Problem List   Diagnosis Date Noted  . HTN (hypertension) 07/27/2013  . Morbid obesity 07/27/2013  . Bipolar 1 disorder, mixed, moderate 07/27/2013  . DM2 (diabetes mellitus, type 2) 04/26/2013       Diabetes type 2 Metformin refill A1c today Lipid panel   Hypertension Patient started on HCTZ as he did not tolerate lisinopril Renal panel  Follow up in 3 months  The patient was given clear instructions to go to ER or return to medical center if symptoms don't improve, worsen or new problems develop. The patient verbalized understanding. The patient was told to call to get any lab results if not heard anything in the next week.

## 2013-10-04 LAB — COMPLETE METABOLIC PANEL WITH GFR
ALT: 22 U/L (ref 0–53)
AST: 19 U/L (ref 0–37)
Albumin: 4 g/dL (ref 3.5–5.2)
Alkaline Phosphatase: 51 U/L (ref 39–117)
BILIRUBIN TOTAL: 0.6 mg/dL (ref 0.3–1.2)
BUN: 8 mg/dL (ref 6–23)
CO2: 29 mEq/L (ref 19–32)
CREATININE: 0.75 mg/dL (ref 0.50–1.35)
Calcium: 8.7 mg/dL (ref 8.4–10.5)
Chloride: 102 mEq/L (ref 96–112)
GFR, Est Non African American: >89 mL/min
Glucose, Bld: 139 mg/dL — ABNORMAL HIGH (ref 70–99)
Potassium: 3.8 mEq/L (ref 3.5–5.3)
Sodium: 138 mEq/L (ref 135–145)
Total Protein: 6.6 g/dL (ref 6.0–8.3)

## 2013-10-04 LAB — LIPID PANEL
CHOLESTEROL: 116 mg/dL (ref 0–200)
HDL: 35 mg/dL — AB (ref >39)
LDL CALC: 57 mg/dL (ref 0–99)
TRIGLYCERIDES: 118 mg/dL (ref <150)
Total CHOL/HDL Ratio: 3.3 Ratio
VLDL: 24 mg/dL (ref 0–40)

## 2013-10-04 LAB — TSH: TSH: 1.675 u[IU]/mL (ref 0.350–4.500)

## 2013-10-05 ENCOUNTER — Telehealth: Payer: Self-pay | Admitting: *Deleted

## 2013-10-05 NOTE — Telephone Encounter (Signed)
Message copied by Alabama Doig, [place] on Wed Oct 05, 2013  3:37 PM ------      Message from: Susie CassetteABROL MD, [facility]NAYANA      Created: Wed Oct 05, 2013  3:22 PM       Currently notify patient of the patient's labs were normal with the exception of hemoglobin A1c which is 6.2. ------

## 2013-10-05 NOTE — Telephone Encounter (Signed)
Left a voicemail for pt to give us a call back. 

## 2014-01-02 ENCOUNTER — Ambulatory Visit: Payer: Medicare Other | Admitting: Internal Medicine

## 2014-01-12 ENCOUNTER — Ambulatory Visit: Payer: Medicare Other | Admitting: Pharmacist

## 2014-08-17 ENCOUNTER — Encounter (HOSPITAL_COMMUNITY): Payer: Self-pay | Admitting: Emergency Medicine

## 2014-08-17 ENCOUNTER — Emergency Department (INDEPENDENT_AMBULATORY_CARE_PROVIDER_SITE_OTHER)
Admission: EM | Admit: 2014-08-17 | Discharge: 2014-08-17 | Disposition: A | Discharge status: Home / Self Care | Payer: Medicare Other | Source: Home / Self Care | Attending: Emergency Medicine | Admitting: Emergency Medicine

## 2014-08-17 DIAGNOSIS — L03116 Cellulitis of left lower limb: Secondary | ICD-10-CM | POA: Diagnosis not present

## 2014-08-17 MED ORDER — CLINDAMYCIN HCL 300 MG PO CAPS: 300.0000 mg | ORAL_CAPSULE | Freq: Four times a day (QID) | ORAL | Status: DC

## 2014-08-17 MED ORDER — CEFTRIAXONE SODIUM 1 G IJ SOLR
1.0000 g | Freq: Once | INTRAMUSCULAR | Status: AC
  Administered 2014-08-17: 1 g via INTRAMUSCULAR

## 2014-08-17 MED ORDER — CEFTRIAXONE SODIUM 1 G IJ SOLR
INTRAMUSCULAR | Status: AC
  Filled 2014-08-17: qty 10

## 2014-08-17 MED ORDER — LIDOCAINE HCL (PF) 1 % IJ SOLN
INTRAMUSCULAR | Status: AC
  Filled 2014-08-17: qty 5

## 2014-08-17 NOTE — Discharge Instructions (Signed)
Monitor the cellulitis areas. If they are getting much bigger, go to the ER for more help. Otherwise, follow up with your doctor to make sure it is getting better and your blood sugar is well controlled.    Cellulitis Cellulitis is an infection of the skin and the tissue beneath it. The infected area is usually red and tender. Cellulitis occurs most often in the arms and lower legs.  CAUSES  Cellulitis is caused by bacteria that enter the skin through cracks or cuts in the skin. The most common types of bacteria that cause cellulitis are staphylococci and streptococci. SIGNS AND SYMPTOMS   Redness and warmth.  Swelling.  Tenderness or pain.  Fever. DIAGNOSIS  Your health care provider can usually determine what is wrong based on a physical exam. Blood tests may also be done. TREATMENT  Treatment usually involves taking an antibiotic medicine. HOME CARE INSTRUCTIONS   Take your antibiotic medicine as directed by your health care provider. Finish the antibiotic even if you start to feel better.  Keep the infected arm or leg elevated to reduce swelling.  Apply a warm cloth to the affected area up to 4 times per day to relieve pain.  Take medicines only as directed by your health care provider.  Keep all follow-up visits as directed by your health care provider. SEEK MEDICAL CARE IF:   You notice red streaks coming from the infected area.  Your red area gets larger or turns dark in color.  Your bone or joint underneath the infected area becomes painful after the skin has healed.  Your infection returns in the same area or another area.  You notice a swollen bump in the infected area.  You develop new symptoms.  You have a fever. SEEK IMMEDIATE MEDICAL CARE IF:   You feel very sleepy.  You develop vomiting or diarrhea.  You have a general ill feeling (malaise) with muscle aches and pains. MAKE SURE YOU:   Understand these instructions.  Will watch your  condition.  Will get help right away if you are not doing well or get worse. Document Released: 06/25/2005 Document Revised: 01/30/2014 Document Reviewed: 12/01/2011 [facility] Patient Information 2015 [place], [place]. This information is not intended to replace advice given to you by your health care provider. Make sure you discuss any questions you have with your health care provider.

## 2014-08-17 NOTE — ED Provider Notes (Signed)
CSN: [id number]     Arrival date & time 08/17/14  1920 History   First MD Initiated Contact with Patient 08/17/14 2027     Chief Complaint  Patient presents with  . Leg Pain   (Consider location/radiation/quality/duration/timing/severity/associated sxs/prior Treatment) HPI Comments: Pt reports he has cellulitis. Had similar sx in 2014, treated in ER. Sx started only about 5 hours ago. He reports his blood sugars have been "good" in 120-140's range, but that he did eat a large chocolate bar today.   Patient is a 33 y.o. male presenting with leg pain. The history is provided by the patient.  Leg Pain Location:  Leg Leg location:  L upper leg Pain details:    Quality:  Aching   Radiates to:  Does not radiate   Severity:  Moderate   Onset quality:  Gradual   Duration:  5 hours   Timing:  Constant   Progression:  Worsening Chronicity:  New Relieved by:  None tried Worsened by:  Nothing tried Ineffective treatments:  None tried Associated symptoms: no fever     Past Medical History  Diagnosis Date  . Morbid obesity   . ADHD (attention deficit hyperactivity disorder)   . Bipolar disorder   . Diabetes mellitus without complication    Past Surgical History  Procedure Laterality Date  . Surgery on meatus as a child     History reviewed. No pertinent family history. History  Substance Use Topics  . Smoking status: Current Every Day Smoker -- 1.00 packs/day    Types: Cigarettes  . Smokeless tobacco: Not on file  . Alcohol Use: Yes     Comment: Rarely    Review of Systems  Constitutional: Negative for fever and chills.  Skin: Positive for rash.    Allergies  Review of patient's allergies indicates no known allergies.  Home Medications   Prior to Admission medications   Medication Sig Start Date End Date Taking? Authorizing Provider  acetaminophen (TYLENOL) 325 MG tablet Take 650 mg by mouth every 8 (eight) hours as needed. For pain.   Yes Historical Provider, MD    atomoxetine (STRATTERA) 10 MG capsule Take 10 mg by mouth at bedtime.    Yes Historical Provider, MD  hydrochlorothiazide (HYDRODIURIL) 25 MG tablet Take 1 tablet (25 mg total) by mouth daily. 10/03/13  Yes Reyne Dumas, MD  metFORMIN (GLUCOPHAGE) 1000 MG tablet Take 1 tablet (1,000 mg total) by mouth 2 (two) times daily with a meal. 10/03/13  Yes Reyne Dumas, MD  topiramate (TOPAMAX) 100 MG tablet Take 100 mg by mouth 2 (two) times daily.     Yes Historical Provider, MD  traMADol (ULTRAM) 50 MG tablet Take 50 mg by mouth every 6 (six) hours as needed for pain.   Yes Historical Provider, MD  clindamycin (CLEOCIN) 300 MG capsule Take 1 capsule (300 mg total) by mouth 4 (four) times daily. 08/17/14   Carvel Getting, NP  clotrimazole (LOTRIMIN) 1 % cream Apply to affected area 2 times daily 04/09/13   Wandra Arthurs, MD  glucose monitoring kit (FREESTYLE) monitoring kit 1 each by Does not apply route 4 (four) times daily - after meals and at bedtime. 1 month Diabetic Testing Supplies for QAC-QHS accuchecks. 07/27/13   Thurnell Lose, MD   BP 142/91 mmHg  Pulse 101  Temp(Src) 98.1 F (36.7 C) (Oral)  Resp 20  SpO2 96% Physical Exam  Constitutional: He appears well-developed and well-nourished. No distress.  Musculoskeletal:  Legs: Skin: Skin is warm, dry and intact. There is erythema.    ED Course  Procedures (including critical care time) Labs Review Labs Reviewed - No data to display  Imaging Review No results found.   MDM   1. Cellulitis of left lower extremity   Pt given rocephin 1gm IM here at [facility]. Rx clindamycin 36m QID #28. Margins of cellulitis marked and pt to monitor. If progresses and becomes worse, pt to go to ER for help. Otherwise to f/u with pcp.     ACarvel Getting NP 08/17/14 2036

## 2014-08-17 NOTE — ED Notes (Signed)
C/o left leg pain.  On set about 3 to 4 hours ago.  Upper left and mid thigh.  Two red raised areas noted.  States pain gradually getting worse.  Pt has a hx of recurrent cellulitis.

## 2015-06-08 ENCOUNTER — Emergency Department (INDEPENDENT_AMBULATORY_CARE_PROVIDER_SITE_OTHER)
Admission: EM | Admit: 2015-06-08 | Discharge: 2015-06-08 | Disposition: A | Discharge status: Home / Self Care | Payer: Medicare Other | Source: Home / Self Care

## 2015-06-08 ENCOUNTER — Encounter (HOSPITAL_COMMUNITY): Payer: Self-pay | Admitting: Emergency Medicine

## 2015-06-08 DIAGNOSIS — S838X2A Sprain of other specified parts of left knee, initial encounter: Secondary | ICD-10-CM

## 2015-06-08 DIAGNOSIS — S8392XA Sprain of unspecified site of left knee, initial encounter: Secondary | ICD-10-CM

## 2015-06-08 NOTE — ED Notes (Signed)
C/o left knee pain onset this am  Reports he was walking his dog and he felt a "crunch" Pain increases when he bends knee Alert and oriented x4... No acute distress.

## 2015-06-08 NOTE — Discharge Instructions (Signed)
You have likely injured the meniscus of your left leg. This was hopefully a minimal injury and your symptoms will resolve over the next several days to weeks. Please remain active with regular aerobic exercise. Please use ibuprofen 600 mg to 800 mg every 6-8 hours. Please consider using a neoprene compression sleeve for your knee to provide additional support. Please consider following with orthopedic surgery if her symptoms do not improve as you may need surgical intervention to repair the cartilage.

## 2015-06-08 NOTE — ED Provider Notes (Signed)
CSN: [id number]     Arrival date & time 06/08/15  1923 History   None    Chief Complaint  Patient presents with  . Knee Pain   (Consider location/radiation/quality/duration/timing/severity/associated sxs/prior Treatment) HPI   L knee pain. Started this morning.  Heard a crunch Minimally painful. Getting worse Now feels like on fire Advil 400 w/o improvement.  Ice w/ iminiam improvement.  Minimal swelling   Past Medical History  Diagnosis Date  . Morbid obesity   . ADHD (attention deficit hyperactivity disorder)   . Bipolar disorder   . Diabetes mellitus without complication    Past Surgical History  Procedure Laterality Date  . Surgery on meatus as a child     No family history on file. Social History  Substance Use Topics  . Smoking status: Current Every Day Smoker -- 1.00 packs/day    Types: Cigarettes  . Smokeless tobacco: None  . Alcohol Use: Yes     Comment: Rarely    Review of Systems Per HPI with all other pertinent systems negative.   Allergies  Review of patient's allergies indicates no known allergies.  Home Medications   Prior to Admission medications   Medication Sig Start Date End Date Taking? Authorizing Provider  atomoxetine (STRATTERA) 10 MG capsule Take 10 mg by mouth at bedtime.    Yes Historical Provider, MD  metFORMIN (GLUCOPHAGE) 1000 MG tablet Take 1 tablet (1,000 mg total) by mouth 2 (two) times daily with a meal. 10/03/13  Yes Reyne Dumas, MD  topiramate (TOPAMAX) 100 MG tablet Take 100 mg by mouth 2 (two) times daily.     Yes Historical Provider, MD  acetaminophen (TYLENOL) 325 MG tablet Take 650 mg by mouth every 8 (eight) hours as needed. For pain.    Historical Provider, MD  clindamycin (CLEOCIN) 300 MG capsule Take 1 capsule (300 mg total) by mouth 4 (four) times daily. 08/17/14   Carvel Getting, NP  clotrimazole (LOTRIMIN) 1 % cream Apply to affected area 2 times daily 04/09/13   Wandra Arthurs, MD  glucose monitoring kit (FREESTYLE)  monitoring kit 1 each by Does not apply route 4 (four) times daily - after meals and at bedtime. 1 month Diabetic Testing Supplies for QAC-QHS accuchecks. 07/27/13   Thurnell Lose, MD  hydrochlorothiazide (HYDRODIURIL) 25 MG tablet Take 1 tablet (25 mg total) by mouth daily. 10/03/13   Reyne Dumas, MD  traMADol (ULTRAM) 50 MG tablet Take 50 mg by mouth every 6 (six) hours as needed for pain.    Historical Provider, MD   Meds Ordered and Administered this Visit  Medications - No data to display  BP 125/81 mmHg  Pulse 88  Temp(Src) 97.3 F (36.3 C) (Oral)  Resp 16  SpO2 96% No data found.   Physical Exam Physical Exam  Constitutional: oriented to person, place, and time. appears well-developed and well-nourished. No distress.  HENT:  Head: Normocephalic and atraumatic.  Eyes: EOMI. PERRL.  Neck: Normal range of motion.  Cardiovascular: RRR, no m/r/g, 2+ distal pulses,  Pulmonary/Chest: Effort normal and breath sounds normal. No respiratory distress.  Abdominal: Soft. Bowel sounds are normal. NonTTP, no distension.  Musculoskeletal: Left knee with full range of motion, no effusion, Lachman's negative, valgus stresses with tenderness. Varus stresses without tenderness. Medial joint line tenderness. No crepitus.  Neurological: alert and oriented to person, place, and time.  Skin: Skin is warm. No rash noted. non diaphoretic.  Psychiatric: normal mood and affect. behavior is normal. Judgment  and thought content normal.   ED Course  Procedures (including critical care time)  Labs Review Labs Reviewed - No data to display  Imaging Review No results found.   Visual Acuity Review  Right Eye Distance:   Left Eye Distance:   Bilateral Distance:    Right Eye Near:   Left Eye Near:    Bilateral Near:         MDM   1. Meniscal injury, left, initial encounter    Likely with meniscal injury of the left knee. Start Advil 800 mg every 8 hours, decompression sleeve for  comfort and protection, leg strengthening exercises, follow-up with Ortho if not improving.    Waldemar Dickens, MD 06/08/15 2157

## 2016-02-23 ENCOUNTER — Encounter (HOSPITAL_COMMUNITY): Payer: Self-pay | Admitting: Emergency Medicine

## 2016-02-23 ENCOUNTER — Ambulatory Visit (HOSPITAL_COMMUNITY)
Admission: EM | Admit: 2016-02-23 | Discharge: 2016-02-23 | Disposition: A | Discharge status: Home / Self Care | Payer: Medicare Other | Attending: Emergency Medicine | Admitting: Emergency Medicine

## 2016-02-23 DIAGNOSIS — J45909 Unspecified asthma, uncomplicated: Secondary | ICD-10-CM

## 2016-02-23 DIAGNOSIS — R739 Hyperglycemia, unspecified: Secondary | ICD-10-CM | POA: Diagnosis not present

## 2016-02-23 LAB — POCT I-STAT, CHEM 8
BUN: 5 mg/dL — ABNORMAL LOW (ref 6–20)
CALCIUM ION: 1.08 mmol/L — AB (ref 1.12–1.23)
Chloride: 98 mmol/L — ABNORMAL LOW (ref 101–111)
Creatinine, Ser: 0.6 mg/dL — ABNORMAL LOW (ref 0.61–1.24)
GLUCOSE: 409 mg/dL — AB (ref 65–99)
HCT: 50 % (ref 39.0–52.0)
Hemoglobin: 17 g/dL (ref 13.0–17.0)
Potassium: 4.1 mmol/L (ref 3.5–5.1)
SODIUM: 137 mmol/L (ref 135–145)
TCO2: 25 mmol/L (ref 0–100)

## 2016-02-23 LAB — GLUCOSE, CAPILLARY
GLUCOSE-CAPILLARY: 431 mg/dL — AB (ref 65–99)
Glucose-Capillary: 427 mg/dL — ABNORMAL HIGH (ref 65–99)

## 2016-02-23 LAB — POCT URINALYSIS DIP (DEVICE)
Bilirubin Urine: NEGATIVE
GLUCOSE, UA: 500 mg/dL — AB
Hgb urine dipstick: NEGATIVE
Ketones, ur: NEGATIVE mg/dL
LEUKOCYTES UA: NEGATIVE
NITRITE: NEGATIVE
PROTEIN: NEGATIVE mg/dL
Specific Gravity, Urine: <=1.005 (ref 1.005–1.030)
Urobilinogen, UA: 0.2 mg/dL (ref 0.0–1.0)
pH: 5.5 (ref 5.0–8.0)

## 2016-02-23 MED ORDER — INSULIN ASPART 100 UNIT/ML ~~LOC~~ SOLN
SUBCUTANEOUS | Status: AC
  Filled 2016-02-23: qty 1

## 2016-02-23 MED ORDER — CEPHALEXIN 500 MG PO CAPS: 500.0000 mg | ORAL_CAPSULE | Freq: Four times a day (QID) | ORAL | Status: DC

## 2016-02-23 MED ORDER — INSULIN ASPART 100 UNIT/ML ~~LOC~~ SOLN
30.0000 [IU] | Freq: Once | SUBCUTANEOUS | Status: AC
  Administered 2016-02-23: 30 [IU] via SUBCUTANEOUS

## 2016-02-23 MED ORDER — ALBUTEROL SULFATE HFA 108 (90 BASE) MCG/ACT IN AERS: 1.0000 | INHALATION_SPRAY | Freq: Four times a day (QID) | RESPIRATORY_TRACT | Status: DC | PRN

## 2016-02-23 MED ORDER — ALBUTEROL SULFATE (2.5 MG/3ML) 0.083% IN NEBU
INHALATION_SOLUTION | RESPIRATORY_TRACT | Status: AC
  Filled 2016-02-23: qty 3

## 2016-02-23 MED ORDER — ALBUTEROL SULFATE (2.5 MG/3ML) 0.083% IN NEBU
2.5000 mg | INHALATION_SOLUTION | Freq: Once | RESPIRATORY_TRACT | Status: AC
  Administered 2016-02-23: 2.5 mg via RESPIRATORY_TRACT

## 2016-02-23 NOTE — ED Provider Notes (Signed)
CSN: [id number]     Arrival date & time 02/23/16  1644 History   First MD Initiated Contact with Patient 02/23/16 1735     Chief Complaint  Patient presents with  . URI  . Hyperglycemia   (Consider location/radiation/quality/duration/timing/severity/associated sxs/prior Treatment) HPI History obtained from patient:  Pt presents with the cc of:  Wheezing Duration of symptoms: Several days Treatment prior to arrival: None Context: Patient has been ill for several days with wheezing Other symptoms include: Elevated blood sugar Pain score: None FAMILY HISTORY: Diabetes in family    Past Medical History  Diagnosis Date  . Morbid obesity ([place])   . ADHD (attention deficit hyperactivity disorder)   . Bipolar disorder ([place])   . Diabetes mellitus without complication [facility])    Past Surgical History  Procedure Laterality Date  . Surgery on meatus as a child     No family history on file. Social History  Substance Use Topics  . Smoking status: Current Every Day Smoker -- 1.00 packs/day    Types: Cigarettes  . Smokeless tobacco: None  . Alcohol Use: Yes     Comment: Rarely    Review of Systems  Denies: HEADACHE, NAUSEA, ABDOMINAL PAIN, CHEST PAIN, CONGESTION, DYSURIA, SHORTNESS OF BREATH  Allergies  Review of patient's allergies indicates no known allergies.  Home Medications   Prior to Admission medications   Medication Sig Start Date End Date Taking? Authorizing Provider  atomoxetine (STRATTERA) 10 MG capsule Take 10 mg by mouth at bedtime.    Yes Historical Provider, MD  metFORMIN (GLUCOPHAGE) 1000 MG tablet Take 1 tablet (1,000 mg total) by mouth 2 (two) times daily with a meal. 10/03/13  Yes Reyne Dumas, MD  acetaminophen (TYLENOL) 325 MG tablet Take 650 mg by mouth every 8 (eight) hours as needed. For pain.    Historical Provider, MD  albuterol (PROVENTIL HFA;VENTOLIN HFA) 108 (90 Base) MCG/ACT inhaler Inhale 1-2 puffs into the lungs every 6 (six) hours as needed for  wheezing or shortness of breath. 02/23/16   Konrad Felix, PA  cephALEXin (KEFLEX) 500 MG capsule Take 1 capsule (500 mg total) by mouth 4 (four) times daily. 02/23/16   Konrad Felix, PA  clindamycin (CLEOCIN) 300 MG capsule Take 1 capsule (300 mg total) by mouth 4 (four) times daily. 08/17/14   Carvel Getting, NP  clotrimazole (LOTRIMIN) 1 % cream Apply to affected area 2 times daily 04/09/13   Wandra Arthurs, MD  glucose monitoring kit (FREESTYLE) monitoring kit 1 each by Does not apply route 4 (four) times daily - after meals and at bedtime. 1 month Diabetic Testing Supplies for QAC-QHS accuchecks. 07/27/13   Thurnell Lose, MD  hydrochlorothiazide (HYDRODIURIL) 25 MG tablet Take 1 tablet (25 mg total) by mouth daily. 10/03/13   Reyne Dumas, MD  topiramate (TOPAMAX) 100 MG tablet Take 100 mg by mouth 2 (two) times daily.      Historical Provider, MD  traMADol (ULTRAM) 50 MG tablet Take 50 mg by mouth every 6 (six) hours as needed for pain.    Historical Provider, MD   Meds Ordered and Administered this Visit   Medications  albuterol (PROVENTIL) (2.5 MG/3ML) 0.083% nebulizer solution 2.5 mg (2.5 mg Nebulization Given 02/23/16 1831)  insulin aspart (novoLOG) injection 30 Units (30 Units Subcutaneous Given 02/23/16 1916)  Improved breathing after nebulizer treatment to the point the patient states he is ready to go smoke again. Also glucose did fall just a bit with regular insulin  30 units subcutaneous.  BP 134/87 mmHg  Pulse 91  Temp(Src) 98 F (36.7 C) (Oral)  Resp 18  SpO2 97% No data found.   Physical Exam NURSES NOTES AND VITAL SIGNS REVIEWED. CONSTITUTIONAL: Well developed, well nourished, no acute distress HEENT: normocephalic, atraumatic EYES: Conjunctiva normal NECK:normal ROM, supple, no adenopathy PULMONARY:No respiratory distress, normal effort, diffuse wheezing throughout the lung fields. No crackles or evidence of consolidation noted. ABDOMINAL: Soft, ND, NT BS+, No  CVAT MUSCULOSKELETAL: Normal ROM of all extremities,  SKIN: warm and dry without rash PSYCHIATRIC: Mood and affect, behavior are normal  ED Course  Procedures (including critical care time)  Labs Review Labs Reviewed  GLUCOSE, CAPILLARY - Abnormal; Notable for the following:    Glucose-Capillary 431 (*)    All other components within normal limits  GLUCOSE, CAPILLARY - Abnormal; Notable for the following:    Glucose-Capillary 427 (*)    All other components within normal limits  POCT I-STAT, CHEM 8 - Abnormal; Notable for the following:    Chloride 98 (*)    BUN 5 (*)    Creatinine, Ser 0.60 (*)    Glucose, Bld 409 (*)    Calcium, Ion 1.08 (*)    All other components within normal limits  POCT URINALYSIS DIP (DEVICE) - Abnormal; Notable for the following:    Glucose, UA 500 (*)    All other components within normal limits  Laboratory data has been evaluated and discussed with patient.  Imaging Review No results found.   Visual Acuity Review  Right Eye Distance:   Left Eye Distance:   Bilateral Distance:    Right Eye Near:   Left Eye Near:    Bilateral Near:      Prescriptions for albuterol and Keflex provided to the patient. Patient is advised that he needs to follow-up his hyperglycemia with his primary care provider first part of the week. There is no emergent indication at this time the patient needs to start insulin. He does not have any ketones in his urine. I-STAT 8 is normal.   MDM   1. Hyperglycemia   2. Asthmatic bronchitis, unspecified asthma severity, uncomplicated     Patient is reassured that there are no issues that require transfer to higher level of care at this time or additional tests. Patient is advised to continue home symptomatic treatment. Patient is advised that if there are new or worsening symptoms to attend the emergency department, contact primary care provider, or return to UC. Instructions of care provided discharged home in stable  condition.    THIS NOTE WAS GENERATED USING A VOICE RECOGNITION SOFTWARE PROGRAM. ALL REASONABLE EFFORTS  WERE MADE TO PROOFREAD THIS DOCUMENT FOR ACCURACY.  I have verbally reviewed the discharge instructions with the patient. A printed AVS was given to the patient.  All questions were answered prior to discharge.      Konrad Felix, PA 02/23/16 2047

## 2016-02-23 NOTE — ED Notes (Signed)
Pt c/o cold sx onset x3 days associated w/cough, congestion, wheezing, and SOB Also reports hyperglycemia... Sugar level last night was 450 and this am was 329 A&O x4... No acute distress.. Playing on his phone.... Friend at bedside

## 2016-02-23 NOTE — Discharge Instructions (Signed)

## 2016-04-21 ENCOUNTER — Encounter (HOSPITAL_COMMUNITY): Payer: Self-pay

## 2016-04-21 ENCOUNTER — Encounter (HOSPITAL_COMMUNITY): Payer: Self-pay | Admitting: Emergency Medicine

## 2016-04-21 ENCOUNTER — Ambulatory Visit (HOSPITAL_COMMUNITY)
Admission: EM | Admit: 2016-04-21 | Discharge: 2016-04-21 | Disposition: A | Discharge status: Nursing Home (Includes ICF) | Payer: Medicare Other | Attending: Emergency Medicine | Admitting: Emergency Medicine

## 2016-04-21 DIAGNOSIS — L03116 Cellulitis of left lower limb: Secondary | ICD-10-CM | POA: Diagnosis not present

## 2016-04-21 DIAGNOSIS — I10 Essential (primary) hypertension: Secondary | ICD-10-CM | POA: Insufficient documentation

## 2016-04-21 DIAGNOSIS — Z79899 Other long term (current) drug therapy: Secondary | ICD-10-CM | POA: Insufficient documentation

## 2016-04-21 DIAGNOSIS — E119 Type 2 diabetes mellitus without complications: Secondary | ICD-10-CM | POA: Diagnosis not present

## 2016-04-21 DIAGNOSIS — F17210 Nicotine dependence, cigarettes, uncomplicated: Secondary | ICD-10-CM | POA: Insufficient documentation

## 2016-04-21 DIAGNOSIS — M79605 Pain in left leg: Secondary | ICD-10-CM | POA: Diagnosis not present

## 2016-04-21 DIAGNOSIS — M7989 Other specified soft tissue disorders: Secondary | ICD-10-CM | POA: Diagnosis not present

## 2016-04-21 DIAGNOSIS — Z7984 Long term (current) use of oral hypoglycemic drugs: Secondary | ICD-10-CM | POA: Insufficient documentation

## 2016-04-21 LAB — CBC WITH DIFFERENTIAL/PLATELET
BASOS ABS: 0 10*3/uL (ref 0.0–0.1)
Basophils Relative: 0 %
Eosinophils Absolute: 0.5 10*3/uL (ref 0.0–0.7)
Eosinophils Relative: 4 %
HCT: 50.5 % (ref 39.0–52.0)
Hemoglobin: 17.3 g/dL — ABNORMAL HIGH (ref 13.0–17.0)
Lymphocytes Relative: 23 %
Lymphs Abs: 2.7 10*3/uL (ref 0.7–4.0)
MCH: 28.4 pg (ref 26.0–34.0)
MCHC: 34.3 g/dL (ref 30.0–36.0)
MCV: 82.9 fL (ref 78.0–100.0)
MONO ABS: 0.6 10*3/uL (ref 0.1–1.0)
MONOS PCT: 5 %
NEUTROS ABS: 8.1 10*3/uL — AB (ref 1.7–7.7)
Neutrophils Relative %: 68 %
Platelets: 132 10*3/uL — ABNORMAL LOW (ref 150–400)
RBC: 6.09 MIL/uL — ABNORMAL HIGH (ref 4.22–5.81)
RDW: 12.8 % (ref 11.5–15.5)
WBC: 12 10*3/uL — ABNORMAL HIGH (ref 4.0–10.5)

## 2016-04-21 LAB — BASIC METABOLIC PANEL
Anion gap: 10 (ref 5–15)
BUN: 5 mg/dL — ABNORMAL LOW (ref 6–20)
CALCIUM: 9.1 mg/dL (ref 8.9–10.3)
CO2: 25 mmol/L (ref 22–32)
Chloride: 99 mmol/L — ABNORMAL LOW (ref 101–111)
Creatinine, Ser: 0.78 mg/dL (ref 0.61–1.24)
GFR calc Af Amer: >60 mL/min (ref >60)
GFR calc non Af Amer: >60 mL/min (ref >60)
Glucose, Bld: 356 mg/dL — ABNORMAL HIGH (ref 65–99)
POTASSIUM: 4 mmol/L (ref 3.5–5.1)
Sodium: 134 mmol/L — ABNORMAL LOW (ref 135–145)

## 2016-04-21 LAB — PROTIME-INR
INR: 1.1 (ref 0.00–1.49)
Prothrombin Time: 14.4 s (ref 11.6–15.2)

## 2016-04-21 MED ORDER — MORPHINE SULFATE (PF) 2 MG/ML IV SOLN
2.0000 mg | Freq: Once | INTRAVENOUS | Status: AC
  Administered 2016-04-21: 2 mg via INTRAMUSCULAR

## 2016-04-21 MED ORDER — MORPHINE SULFATE (PF) 2 MG/ML IV SOLN
INTRAVENOUS | Status: AC
  Filled 2016-04-21: qty 1

## 2016-04-21 NOTE — ED Notes (Signed)
Called report to [place], rn in the emergency department

## 2016-04-21 NOTE — ED Triage Notes (Signed)
Pt complaining of L lower leg pain. States feels swollen. Seen by urgent care told to come here for possible clot. Left leg markedly swollen. No obvious redness. Pt complaining of pain behind L knee.

## 2016-04-21 NOTE — ED Provider Notes (Signed)
[place]    CSN: [id number] Arrival date & time: 04/21/16  1646  First Provider Contact:  None       History   Chief Complaint Chief Complaint  Patient presents with  . Leg Pain    HPI Bradley [name] is a 35 y.o. male.   He is a 35 year old man here for evaluation of left leg pain. This started about 2 days ago after doing some mild to moderate walking or playing Pokmon go. It has gradually worsened until today when it has been quite unbearable. Initially, it would respond to Advil, but doesn't anymore. His girlfriend denies any change in swelling. No change in skin color. He does not remember injuring it in any way. It tends to feel better when it is propped up. He describes the pain as a tightness. It is present and has calf, but most prominent in the anterior and medial thigh. He does have a history of diabetes and obesity. He also has a history of cellulitis in his legs.    Past Medical History:  Diagnosis Date  . ADHD (attention deficit hyperactivity disorder)   . Bipolar disorder ([place])   . Diabetes mellitus without complication ([place])   . Morbid obesity [facility])     Patient Active Problem List   Diagnosis Date Noted  . HTN (hypertension) 07/27/2013  . Morbid obesity ([place]) 07/27/2013  . Bipolar 1 disorder, mixed, moderate ([place]) 07/27/2013  . DM2 (diabetes mellitus, type 2) ([place]) 04/26/2013    Past Surgical History:  Procedure Laterality Date  . surgery on meatus as a child         Home Medications    Prior to Admission medications   Medication Sig Start Date End Date Taking? Authorizing Provider  acetaminophen (TYLENOL) 325 MG tablet Take 650 mg by mouth every 8 (eight) hours as needed. For pain.    Historical Provider, MD  albuterol (PROVENTIL HFA;VENTOLIN HFA) 108 (90 Base) MCG/ACT inhaler Inhale 1-2 puffs into the lungs every 6 (six) hours as needed for wheezing or shortness of breath. 02/23/16   Konrad Felix, PA  atomoxetine (STRATTERA)  10 MG capsule Take 10 mg by mouth at bedtime.     Historical Provider, MD  cephALEXin (KEFLEX) 500 MG capsule Take 1 capsule (500 mg total) by mouth 4 (four) times daily. Patient not taking: Reported on 04/21/2016 02/23/16   Konrad Felix, PA  clindamycin (CLEOCIN) 300 MG capsule Take 1 capsule (300 mg total) by mouth 4 (four) times daily. Patient not taking: Reported on 04/21/2016 08/17/14   Carvel Getting, NP  clotrimazole (LOTRIMIN) 1 % cream Apply to affected area 2 times daily 04/09/13   Drenda Freeze, MD  glucose monitoring kit (FREESTYLE) monitoring kit 1 each by Does not apply route 4 (four) times daily - after meals and at bedtime. 1 month Diabetic Testing Supplies for QAC-QHS accuchecks. 07/27/13   Thurnell Lose, MD  hydrochlorothiazide (HYDRODIURIL) 25 MG tablet Take 1 tablet (25 mg total) by mouth daily. Patient not taking: Reported on 04/21/2016 10/03/13   Reyne Dumas, MD  metFORMIN (GLUCOPHAGE) 1000 MG tablet Take 1 tablet (1,000 mg total) by mouth 2 (two) times daily with a meal. 10/03/13   Reyne Dumas, MD  topiramate (TOPAMAX) 100 MG tablet Take 100 mg by mouth 2 (two) times daily.      Historical Provider, MD  traMADol (ULTRAM) 50 MG tablet Take 50 mg by mouth every 6 (six) hours as needed for pain.  Historical Provider, MD    Family History No family history on file.  Social History Social History  Substance Use Topics  . Smoking status: Current Every Day Smoker    Packs/day: 1.00    Types: Cigarettes  . Smokeless tobacco: Never Used  . Alcohol use Yes     Comment: Rarely     Allergies   Review of patient's allergies indicates no known allergies.   Review of Systems Review of Systems  Constitutional: Negative for fever.  Musculoskeletal: Positive for gait problem and myalgias.  Skin: Negative for color change.     Physical Exam Triage Vital Signs ED Triage Vitals [04/21/16 1834]  Enc Vitals Group     BP      Pulse      Resp      Temp      Temp  src      SpO2      Weight      Height      Head Circumference      Peak Flow      Pain Score 6     Pain Loc      Pain Edu?      Excl. in [place]?    No data found.   Updated Vital Signs BP 123/87 (BP Location: Left Arm)   Pulse 107   Temp 97.5 F (36.4 C) (Oral)   Resp 26   SpO2 98%   Visual Acuity Right Eye Distance:   Left Eye Distance:   Bilateral Distance:    Right Eye Near:   Left Eye Near:    Bilateral Near:     Physical Exam  Constitutional: He is oriented to person, place, and time. He appears well-developed and well-nourished. He appears distressed (looks uncomfortable).  Morbidly obese  Cardiovascular:  Mild tachycardia  Pulmonary/Chest: Effort normal.  Musculoskeletal:  Left leg: He has trace edema to the knee. This is symmetric when compared to the right. He also has superficial varicose veins noted, again this is bilateral. He is exquisitely tender along the shin and the distal medial thigh. No warmth or erythema to suggest infection. No palpable cords. Negative Homans sign. 1+ DP pulse.  Neurological: He is alert and oriented to person, place, and time.     UC Treatments / Results  Labs (all labs ordered are listed, but only abnormal results are displayed) Labs Reviewed - No data to display  EKG  EKG Interpretation None       Radiology No results found.  Procedures Procedures (including critical care time)  Medications Ordered in UC Medications  morphine 2 MG/ML injection 2 mg (2 mg Intramuscular Given 04/21/16 1910)     Initial Impression / Assessment and Plan / UC Course  I have reviewed the triage vital signs and the nursing notes.  Pertinent labs & imaging results that were available during my care of the patient were reviewed by me and considered in my medical decision making (see chart for details).  Clinical Course    Given degree of pain and lack of definitive clinical findings, will transfer to [facility] ED for additional  evaluation. Differential includes musculoskeletal pain, DVT, pain from varicose veins, other vascular etiologies. We'll give morphine 2 mg IM prior to transfer.  Final Clinical Impressions(s) / UC Diagnoses   Final diagnoses:  Left leg pain    New Prescriptions New Prescriptions   No medications on file     Melony Overly, MD 04/21/16 1916

## 2016-04-21 NOTE — ED Triage Notes (Signed)
Left leg pain for 2 days, but has significantly worsened over the past 12 to 14 hours.  Patient reports the pain started after walking a lot .  Lateral lower leg pain that traveled to behind knee and then to upper leg.

## 2016-04-22 ENCOUNTER — Emergency Department (HOSPITAL_COMMUNITY)
Admission: EM | Admit: 2016-04-22 | Discharge: 2016-04-22 | Disposition: A | Discharge status: Home / Self Care | Payer: Medicare Other | Attending: Emergency Medicine | Admitting: Emergency Medicine

## 2016-04-22 DIAGNOSIS — L03116 Cellulitis of left lower limb: Secondary | ICD-10-CM

## 2016-04-22 DIAGNOSIS — M7989 Other specified soft tissue disorders: Secondary | ICD-10-CM

## 2016-04-22 MED ORDER — OXYCODONE-ACETAMINOPHEN 5-325 MG PO TABS
ORAL_TABLET | ORAL | Status: AC
  Filled 2016-04-22: qty 1

## 2016-04-22 MED ORDER — OXYCODONE-ACETAMINOPHEN 5-325 MG PO TABS
1.0000 | ORAL_TABLET | ORAL | Status: DC | PRN
  Administered 2016-04-22: 1 via ORAL

## 2016-04-22 MED ORDER — SULFAMETHOXAZOLE-TRIMETHOPRIM 800-160 MG PO TABS
1.0000 | ORAL_TABLET | Freq: Two times a day (BID) | ORAL | 0 refills | Status: AC
Start: 2016-04-22 — End: 2016-04-29

## 2016-04-22 NOTE — ED Provider Notes (Signed)
[facility]EMERGENCY DEPT Provider Note   CSN: [id number] Arrival date & time: 04/21/16  1947  First Provider Contact:  None     By signing my name below, I, Octavia Heir, attest that this documentation has been prepared under the direction and in the presence of Derwood Kaplan, MD.  Electronically Signed: Octavia Heir, ED Scribe. 04/22/16. 4:08 AM.   History   Chief Complaint Chief Complaint  Patient presents with  . Leg Pain     The history is provided by the patient. No language interpreter was used.   HPI Comments: Bradley [name] is a 35 y.o. male who has a PMhx of DM, cellulitis, DM,  presents to the Emergency Department complaining of intermittent, gradual worsening, moderate left leg pain onset a few days ago. He reports having associated swelling and redness to his left leg. Pt reports he was outside playing a game and later on that night his left leg began to hurt. Pt notes taking advil to alleviate his pain with relief. He states a few days later, he began to have constant left leg pain. He was seen by UC tonight and was told to come to the ED for further evaluation for possible DVT. He denies any other complaints.   Past Medical History:  Diagnosis Date  . ADHD (attention deficit hyperactivity disorder)   . Bipolar disorder (HCC)   . Diabetes mellitus without complication (HCC)   . Morbid obesity [facility])     Patient Active Problem List   Diagnosis Date Noted  . HTN (hypertension) 07/27/2013  . Morbid obesity (HCC) 07/27/2013  . Bipolar 1 disorder, mixed, moderate (HCC) 07/27/2013  . DM2 (diabetes mellitus, type 2) (HCC) 04/26/2013    Past Surgical History:  Procedure Laterality Date  . surgery on meatus as a child         Home Medications    Prior to Admission medications   Medication Sig Start Date End Date Taking? Authorizing Provider  acetaminophen (TYLENOL) 325 MG tablet Take 650 mg by mouth every 8 (eight) hours as needed. For pain.    Historical  Provider, MD  albuterol (PROVENTIL HFA;VENTOLIN HFA) 108 (90 Base) MCG/ACT inhaler Inhale 1-2 puffs into the lungs every 6 (six) hours as needed for wheezing or shortness of breath. 02/23/16   Tharon Aquas, PA  atomoxetine (STRATTERA) 10 MG capsule Take 10 mg by mouth at bedtime.     Historical Provider, MD  cephALEXin (KEFLEX) 500 MG capsule Take 1 capsule (500 mg total) by mouth 4 (four) times daily. Patient not taking: Reported on 04/21/2016 02/23/16   Tharon Aquas, PA  clindamycin (CLEOCIN) 300 MG capsule Take 1 capsule (300 mg total) by mouth 4 (four) times daily. Patient not taking: Reported on 04/21/2016 08/17/14   Cathlyn Parsons, NP  clotrimazole (LOTRIMIN) 1 % cream Apply to affected area 2 times daily 04/09/13   Charlynne Pander, MD  glucose monitoring kit (FREESTYLE) monitoring kit 1 each by Does not apply route 4 (four) times daily - after meals and at bedtime. 1 month Diabetic Testing Supplies for QAC-QHS accuchecks. 07/27/13   Leroy Sea, MD  hydrochlorothiazide (HYDRODIURIL) 25 MG tablet Take 1 tablet (25 mg total) by mouth daily. Patient not taking: Reported on 04/21/2016 10/03/13   Richarda Overlie, MD  metFORMIN (GLUCOPHAGE) 1000 MG tablet Take 1 tablet (1,000 mg total) by mouth 2 (two) times daily with a meal. 10/03/13   Richarda Overlie, MD  sulfamethoxazole-trimethoprim (BACTRIM DS,SEPTRA DS) 800-160 MG tablet  Take 1 tablet by mouth 2 (two) times daily. 04/22/16 04/29/16  Varney Biles, MD  topiramate (TOPAMAX) 100 MG tablet Take 100 mg by mouth 2 (two) times daily.      Historical Provider, MD  traMADol (ULTRAM) 50 MG tablet Take 50 mg by mouth every 6 (six) hours as needed for pain.    Historical Provider, MD    Family History History reviewed. No pertinent family history.  Social History Social History  Substance Use Topics  . Smoking status: Current Every Day Smoker    Packs/day: 1.00    Types: Cigarettes  . Smokeless tobacco: Never Used  . Alcohol use Yes     Comment:  Rarely     Allergies   Review of patient's allergies indicates no known allergies.   Review of Systems Review of Systems  10 Systems reviewed and are negative for acute change except as noted in the HPI.  Physical Exam Updated Vital Signs BP 119/76 (BP Location: Right Arm)   Pulse 108   Temp 98.1 F (36.7 C) (Oral)   Resp 18   Ht 6' (1.829 m)   Wt (!) 310 lb 6.4 oz (140.8 kg)   SpO2 97%   BMI 42.10 kg/m   Physical Exam  Constitutional: He is oriented to person, place, and time. He appears well-developed and well-nourished.  HENT:  Head: Normocephalic.  Eyes: EOM are normal.  Neck: Normal range of motion.  Cardiovascular:  2+ DP pulses  Pulmonary/Chest: Effort normal.  Abdominal: He exhibits no distension.  Musculoskeletal: Normal range of motion.  Edema, erythema, and warmth to touch with calf tenderness in LLE  Neurological: He is alert and oriented to person, place, and time.  Psychiatric: He has a normal mood and affect.  Nursing note and vitals reviewed.    ED Treatments / Results  DIAGNOSTIC STUDIES: Oxygen Saturation is 97% on RA, normal by my interpretation.  COORDINATION OF CARE:  3:50 AM Discussed treatment plan which includes schedule [place] of left leg with pt at bedside and pt agreed to plan.  Labs (all labs ordered are listed, but only abnormal results are displayed) Labs Reviewed  CBC WITH DIFFERENTIAL/PLATELET - Abnormal; Notable for the following:       Result Value   WBC 12.0 (*)    RBC 6.09 (*)    Hemoglobin 17.3 (*)    Platelets 132 (*)    Neutro Abs 8.1 (*)    All other components within normal limits  BASIC METABOLIC PANEL - Abnormal; Notable for the following:    Sodium 134 (*)    Chloride 99 (*)    Glucose, Bld 356 (*)    BUN 5 (*)    All other components within normal limits  PROTIME-INR    EKG  EKG Interpretation None       Radiology No results found.  Procedures Procedures (including critical care  time)  Medications Ordered in ED Medications  oxyCODONE-acetaminophen (PERCOCET/ROXICET) 5-325 MG per tablet 1 tablet (1 tablet Oral Given 04/22/16 0222)  oxyCODONE-acetaminophen (PERCOCET/ROXICET) 5-325 MG per tablet (not administered)     Initial Impression / Assessment and Plan / ED Course  I have reviewed the triage vital signs and the nursing notes.  Pertinent labs & imaging results that were available during my care of the patient were reviewed by me and considered in my medical decision making (see chart for details).  Clinical Course      Final Clinical Impressions(s) / ED Diagnoses   Final diagnoses:  Cellulitis of left lower extremity  Leg swelling   I personally performed the services described in this documentation, which was scribed in my presence. The recorded information has been reviewed and is accurate.  Pt comes in with LLE swelling. There is calf pain, and the leg is swollen, red and warm. Cellulitis vs. DVT - former more likely, but due to calf pain, we have ordered outpatient Korea. Well score > 1, so not a dimer candidate. Pt aware of the plan - if the Korea is neg, he will start antibiotics. If the [place] is + for clots, he will return to the ER.  New Prescriptions New Prescriptions   SULFAMETHOXAZOLE-TRIMETHOPRIM (BACTRIM DS,SEPTRA DS) 800-160 MG TABLET    Take 1 tablet by mouth 2 (two) times daily.     Varney Biles, MD 04/22/16 [phone]

## 2016-04-22 NOTE — Discharge Instructions (Signed)
Expect a call later for an ultrasound in the leg. If the ultrasound is normal  - start antibiotics. If the ultrasound is positive for clot - come back to the ER.  Concerns for cellulitis vs. DVT.

## 2016-04-22 NOTE — ED Notes (Signed)
Patient c/o pain to the left calf and behind the knee.  States he walks a lot playing Pokemon and walking all over the city

## 2016-05-22 ENCOUNTER — Encounter (HOSPITAL_COMMUNITY): Payer: Self-pay | Admitting: Emergency Medicine

## 2016-05-22 ENCOUNTER — Ambulatory Visit (HOSPITAL_COMMUNITY)
Admission: EM | Admit: 2016-05-22 | Discharge: 2016-05-22 | Disposition: A | Discharge status: Nursing Home (Includes ICF) | Payer: Medicare Other | Attending: Emergency Medicine | Admitting: Emergency Medicine

## 2016-05-22 ENCOUNTER — Emergency Department (HOSPITAL_COMMUNITY)
Admission: EM | Admit: 2016-05-22 | Discharge: 2016-05-22 | Disposition: A | Discharge status: Home / Self Care | Payer: Medicare Other | Attending: Emergency Medicine | Admitting: Emergency Medicine

## 2016-05-22 ENCOUNTER — Encounter (HOSPITAL_COMMUNITY): Payer: Self-pay | Admitting: *Deleted

## 2016-05-22 ENCOUNTER — Emergency Department (HOSPITAL_BASED_OUTPATIENT_CLINIC_OR_DEPARTMENT_OTHER): Admit: 2016-05-22 | Discharge: 2016-05-22 | Disposition: A | Discharge status: Home / Self Care | Payer: Medicare Other

## 2016-05-22 DIAGNOSIS — M79609 Pain in unspecified limb: Secondary | ICD-10-CM | POA: Diagnosis not present

## 2016-05-22 DIAGNOSIS — M7989 Other specified soft tissue disorders: Secondary | ICD-10-CM | POA: Diagnosis not present

## 2016-05-22 DIAGNOSIS — Z7984 Long term (current) use of oral hypoglycemic drugs: Secondary | ICD-10-CM | POA: Diagnosis not present

## 2016-05-22 DIAGNOSIS — F17210 Nicotine dependence, cigarettes, uncomplicated: Secondary | ICD-10-CM | POA: Diagnosis not present

## 2016-05-22 DIAGNOSIS — F909 Attention-deficit hyperactivity disorder, unspecified type: Secondary | ICD-10-CM | POA: Insufficient documentation

## 2016-05-22 DIAGNOSIS — M79605 Pain in left leg: Secondary | ICD-10-CM | POA: Diagnosis not present

## 2016-05-22 DIAGNOSIS — I10 Essential (primary) hypertension: Secondary | ICD-10-CM | POA: Insufficient documentation

## 2016-05-22 DIAGNOSIS — E119 Type 2 diabetes mellitus without complications: Secondary | ICD-10-CM | POA: Diagnosis not present

## 2016-05-22 DIAGNOSIS — Z7901 Long term (current) use of anticoagulants: Secondary | ICD-10-CM | POA: Insufficient documentation

## 2016-05-22 DIAGNOSIS — I82412 Acute embolism and thrombosis of left femoral vein: Secondary | ICD-10-CM | POA: Diagnosis not present

## 2016-05-22 MED ORDER — RIVAROXABAN 15 MG PO TABS
15.0000 mg | ORAL_TABLET | Freq: Two times a day (BID) | ORAL | Status: DC
  Administered 2016-05-22: 15 mg via ORAL
  Filled 2016-05-22: qty 1

## 2016-05-22 MED ORDER — RIVAROXABAN (XARELTO) EDUCATION KIT FOR DVT/PE PATIENTS
PACK | Freq: Once | Status: AC
  Administered 2016-05-22: 20:00:00
  Filled 2016-05-22: qty 1

## 2016-05-22 MED ORDER — RIVAROXABAN (XARELTO) VTE STARTER PACK (15 & 20 MG): ORAL_TABLET | ORAL | 0 refills | Status: DC

## 2016-05-22 MED ORDER — RIVAROXABAN 20 MG PO TABS: 20.0000 mg | ORAL_TABLET | Freq: Every day | ORAL | Status: DC

## 2016-05-22 NOTE — ED Notes (Signed)
MD at bedside. 

## 2016-05-22 NOTE — ED Triage Notes (Signed)
Pt  Reports  Pain  And  s welling  To  Left  Leg     X  3  Days   With   Pain   And  Increase   Around  The  l  upper  Inner  Thigh   Near  Groin    Pt  denys  Any  specefic  Injury    He  Was   Seen  For  Similar  Episode   approx  1  Month  Ago          Pt   denys  Any  specefic  Traumatic     Event

## 2016-05-22 NOTE — ED Provider Notes (Signed)
[facility] DEPT Provider Note   CSN: [id number] Arrival date & time: 05/22/16  1241     History   Chief Complaint Chief Complaint  Patient presents with  . Leg Pain  . Leg Swelling    HPI Bradley [name] is a 35 y.o. male.  He presents for evaluation of left leg pain, swelling and redness which started 3 days ago. He presents for evaluation from an urgent care, to be screened for DVT. He denies chest pain, shortness of breath, fever, chills, nausea, vomiting, weakness or dizziness. There are no other known modifying factors.  HPI  Past Medical History:  Diagnosis Date  . ADHD (attention deficit hyperactivity disorder)   . Bipolar disorder ([place])   . Diabetes mellitus without complication ([place])   . Morbid obesity [facility])     Patient Active Problem List   Diagnosis Date Noted  . HTN (hypertension) 07/27/2013  . Morbid obesity ([place]) 07/27/2013  . Bipolar 1 disorder, mixed, moderate ([place]) 07/27/2013  . DM2 (diabetes mellitus, type 2) ([place]) 04/26/2013    Past Surgical History:  Procedure Laterality Date  . surgery on meatus as a child         Home Medications    Prior to Admission medications   Medication Sig Start Date End Date Taking? Authorizing Provider  acetaminophen (TYLENOL) 325 MG tablet Take 650 mg by mouth every 8 (eight) hours as needed. For pain.    Historical Provider, MD  albuterol (PROVENTIL HFA;VENTOLIN HFA) 108 (90 Base) MCG/ACT inhaler Inhale 1-2 puffs into the lungs every 6 (six) hours as needed for wheezing or shortness of breath. 02/23/16   Konrad Felix, PA  atomoxetine (STRATTERA) 10 MG capsule Take 10 mg by mouth at bedtime.     Historical Provider, MD  cephALEXin (KEFLEX) 500 MG capsule Take 1 capsule (500 mg total) by mouth 4 (four) times daily. Patient not taking: Reported on 04/21/2016 02/23/16   Konrad Felix, PA  clindamycin (CLEOCIN) 300 MG capsule Take 1 capsule (300 mg total) by mouth 4 (four) times daily. Patient not taking:  Reported on 04/21/2016 08/17/14   Carvel Getting, NP  clotrimazole (LOTRIMIN) 1 % cream Apply to affected area 2 times daily 04/09/13   Drenda Freeze, MD  glucose monitoring kit (FREESTYLE) monitoring kit 1 each by Does not apply route 4 (four) times daily - after meals and at bedtime. 1 month Diabetic Testing Supplies for QAC-QHS accuchecks. 07/27/13   Thurnell Lose, MD  hydrochlorothiazide (HYDRODIURIL) 25 MG tablet Take 1 tablet (25 mg total) by mouth daily. Patient not taking: Reported on 04/21/2016 10/03/13   Reyne Dumas, MD  metFORMIN (GLUCOPHAGE) 1000 MG tablet Take 1 tablet (1,000 mg total) by mouth 2 (two) times daily with a meal. 10/03/13   Reyne Dumas, MD  Rivaroxaban 15 & 20 MG TBPK Take as directed on package: Start with one 65m tablet by mouth twice a day with food. On Day 22, switch to one 257mtablet once a day with food. 05/22/16   ElDaleen BoMD  topiramate (TOPAMAX) 100 MG tablet Take 100 mg by mouth 2 (two) times daily.      Historical Provider, MD  traMADol (ULTRAM) 50 MG tablet Take 50 mg by mouth every 6 (six) hours as needed for pain.    Historical Provider, MD    Family History History reviewed. No pertinent family history.  Social History Social History  Substance Use Topics  . Smoking status: Current Every Day Smoker  Packs/day: 1.00    Types: Cigarettes  . Smokeless tobacco: Never Used  . Alcohol use Yes     Comment: Rarely     Allergies   Review of patient's allergies indicates no known allergies.   Review of Systems Review of Systems  All other systems reviewed and are negative.    Physical Exam Updated Vital Signs BP 110/82   Pulse 93   Temp 97.4 F (36.3 C) (Oral)   Resp 18   Ht 6' (1.829 m)   Wt (!) 310 lb (140.6 kg)   SpO2 100%   BMI 42.04 kg/m   Physical Exam  Constitutional: He is oriented to person, place, and time. He appears well-developed and well-nourished.  HENT:  Head: Normocephalic and atraumatic.  Right Ear:  External ear normal.  Left Ear: External ear normal.  Eyes: Conjunctivae and EOM are normal. Pupils are equal, round, and reactive to light.  Neck: Normal range of motion and phonation normal. Neck supple.  Cardiovascular: Normal rate.   Pulmonary/Chest: Effort normal. He exhibits no bony tenderness.  Abdominal: Soft. There is no tenderness.  Musculoskeletal: Normal range of motion.  Left leg, tender and swollen with mild erythema of the left medial thigh. Normal distal sensation, circulation in the toes of the left foot.  Neurological: He is alert and oriented to person, place, and time. No cranial nerve deficit or sensory deficit. He exhibits normal muscle tone. Coordination normal.  Skin: Skin is warm, dry and intact.  Psychiatric: He has a normal mood and affect. His behavior is normal. Judgment and thought content normal.  Nursing note and vitals reviewed.    ED Treatments / Results  Labs (all labs ordered are listed, but only abnormal results are displayed) Labs Reviewed - No data to display  EKG  EKG Interpretation None       Radiology No results found.  Procedures Procedures (including critical care time)  Medications Ordered in ED Medications  rivaroxaban [employer]) Education Kit for DVT/PE patients ( Does not apply Given 05/22/16 1949)     Initial Impression / Assessment and Plan / ED Course  I have reviewed the triage vital signs and the nursing notes.  Pertinent labs & imaging results that were available during my care of the patient were reviewed by me and considered in my medical decision making (see chart for details).  Clinical Course    Medications  rivaroxaban [employer] Re[employer]) Education Kit for DVT/PE patients ( Does not apply Given 05/22/16 1949)    Patient Vitals for the past 24 hrs:  BP Temp Temp src Pulse Resp SpO2 Height Weight  05/22/16 1915 110/82 - - 93 - 100 % - -  05/22/16 1900 120/73 - - 85 - 100 % - -  05/22/16 1845 120/74 - - 90 - 100 % - -   05/22/16 1830 119/74 - - 92 - 99 % - -  05/22/16 1815 116/80 - - 94 - 100 % - -  05/22/16 1806 126/97 - - 97 18 98 % - -  05/22/16 1706 - - - - - 97 % - -  05/22/16 1511 128/79 - - 97 18 100 % - -  05/22/16 1249 127/82 97.4 F (36.3 C) Oral 100 18 98 % 6' (1.829 m) (!) 310 lb (140.6 kg)    Venous Doppler ordered, performed and reveals left leg clot, femoral-popliteal. Unable to assess for left calf clot. No apparent right-sided clot.  At discharge- Reevaluation with update and discussion. After initial assessment  and treatment, an updated evaluation reveals he is comfortable. He has spoken with pharmacy for teaching of use of xarelto. All questions answered.Daleen Bo L    Final Clinical Impressions(s) / ED Diagnoses   Final diagnoses:  Acute deep vein thrombosis (DVT) of femoral vein of left lower extremity ([place])    New Prescriptions Discharge Medication List as of 05/22/2016  7:33 PM    START taking these medications   Details  Rivaroxaban 15 & 20 MG TBPK Take as directed on package: Start with one 13m tablet by mouth twice a day with food. On Day 22, switch to one 224mtablet once a day with food., Print         ElDaleen BoMD 05/22/16 [phone]

## 2016-05-22 NOTE — ED Notes (Signed)
Patient transported to Ultrasound 

## 2016-05-22 NOTE — ED Provider Notes (Signed)
HPI  SUBJECTIVE:  Bradley [name] is a 35 y.o. male who presents with 3 days of left lower extremity swelling, "poking, sharp" intermittent left hip and left upper thigh pain and he states that he feels a "lump" in his upper inner thigh yesterday, which is new for him. He reports calf pain, which is also new for him also over the past 3 days. Symptoms are worse with palpation, standing up, no alleviating factors. He has tried ice packs, elevation, Advil 400 mg and 2 pills of an unknown antibiotic. He denies chest pain, shortness of breath, wheezing, hemoptysis, syncope. No fevers. No trauma to the leg. No known insect bite. No surgery in the past 4 weeks, prolonged immobilization, trauma, lower extremity paralysis, 3 days of being bedridden.Pt has a  past medical history of cellulitis, hypertension, obesity. He is a diabetic, states his glucose is been running within normal limits for him. He is also a smoker and has varicose veins. No history DVT, PE, cancer, hypercoagulability, MRSA, abscess.  Patient has a past medical history cellulitis of left lower extremity, left meniscal injury. Was seen here at  this facility on 7/24 for left leg pain, was transferred to the ED to rule out DVT and other vascular etiologies. He was thought to have to have cellulitis. An outpatient ultrasound was ordered, but patient states that he was told that in order was not put in, so he never had this done.  he states all of his symptoms resolved with the antibiotics.   Past Medical History:  Diagnosis Date  . ADHD (attention deficit hyperactivity disorder)   . Bipolar disorder ([place])   . Diabetes mellitus without complication ([place])   . Morbid obesity ([place])     Past Surgical History:  Procedure Laterality Date  . surgery on meatus as a child      History reviewed. No pertinent family history.  Social History  Substance Use Topics  . Smoking status: Current Every Day Smoker    Packs/day: 1.00    Types:  Cigarettes  . Smokeless tobacco: Never Used  . Alcohol use Yes     Comment: Rarely    No current facility-administered medications for this encounter.   Current Outpatient Prescriptions:  .  acetaminophen (TYLENOL) 325 MG tablet, Take 650 mg by mouth every 8 (eight) hours as needed. For pain., Disp: , Rfl:  .  albuterol (PROVENTIL HFA;VENTOLIN HFA) 108 (90 Base) MCG/ACT inhaler, Inhale 1-2 puffs into the lungs every 6 (six) hours as needed for wheezing or shortness of breath., Disp: 1 Inhaler, Rfl: 0 .  atomoxetine (STRATTERA) 10 MG capsule, Take 10 mg by mouth at bedtime. , Disp: , Rfl:  .  cephALEXin (KEFLEX) 500 MG capsule, Take 1 capsule (500 mg total) by mouth 4 (four) times daily. (Patient not taking: Reported on 04/21/2016), Disp: 20 capsule, Rfl: 0 .  clindamycin (CLEOCIN) 300 MG capsule, Take 1 capsule (300 mg total) by mouth 4 (four) times daily. (Patient not taking: Reported on 04/21/2016), Disp: 28 capsule, Rfl: 0 .  clotrimazole (LOTRIMIN) 1 % cream, Apply to affected area 2 times daily, Disp: 15 g, Rfl: 0 .  glucose monitoring kit (FREESTYLE) monitoring kit, 1 each by Does not apply route 4 (four) times daily - after meals and at bedtime. 1 month Diabetic Testing Supplies for QAC-QHS accuchecks., Disp: 1 each, Rfl: 1 .  hydrochlorothiazide (HYDRODIURIL) 25 MG tablet, Take 1 tablet (25 mg total) by mouth daily. (Patient not taking: Reported on 04/21/2016), Disp:  90 tablet, Rfl: 3 .  metFORMIN (GLUCOPHAGE) 1000 MG tablet, Take 1 tablet (1,000 mg total) by mouth 2 (two) times daily with a meal., Disp: 60 tablet, Rfl: 6 .  topiramate (TOPAMAX) 100 MG tablet, Take 100 mg by mouth 2 (two) times daily.  , Disp: , Rfl:  .  traMADol (ULTRAM) 50 MG tablet, Take 50 mg by mouth every 6 (six) hours as needed for pain., Disp: , Rfl:   No Known Allergies   ROS  As noted in HPI.   Physical Exam  BP 123/76 (BP Location: Right Arm)   Pulse 92   Temp 98.3 F (36.8 C) (Oral)   Resp 16    SpO2 99%   Constitutional: Well developed, well nourished, no acute distress Eyes:  EOMI, conjunctiva normal bilaterally HENT: Normocephalic, atraumatic,mucus membranes moist Respiratory: Normal inspiratory effort Cardiovascular: Normal rate GI: nondistended skin: No rash, skin intact Musculoskeletal:Left calf 58 cm, right calf 48 cm. 2+ edema left lower extremity. None RLE.  Positive L calf, popliteal, medial thigh tenderness. No appreciable area of increased temperature or induration. Skin intact. Neurologic: Alert & oriented x 3, no focal neuro deficits Psychiatric: Speech and behavior appropriate   ED Course   Medications - No data to display  No orders of the defined types were placed in this encounter.   No results found for this or any previous visit (from the past 24 hour(s)). No results found.  ED Clinical Impression  Left leg swelling  Pain of left lower extremity   ED Assessment/Plan  Previous records reviewed. As noted in history of present illness.  Wells score 2-4 out of 9.  Presentation most concerning for DVT. Also in differential is cellulitis versus another vascular process.  Patient declined pain medications prior to transfer. Discussed rationale for transfer with patient. He agrees with plan.   *This clinic note was created using Dragon dictation software. Therefore, there may be occasional mistakes despite careful proofreading.  ?   Melynda Ripple, MD 05/22/16 1234

## 2016-05-22 NOTE — Progress Notes (Signed)
VASCULAR LAB PRELIMINARY  PRELIMINARY  PRELIMINARY  PRELIMINARY  Bilateral lower extremity venous duplex completed.    Preliminary report:  There is acute, occlusive DVT noted in the left common femoral, femoral, and popliteal veins as well as thrombus noted at the saphenofemoral junction and superficial thrombosis noted in the proximal greater saphenous vein.  I am unable to insonate the left calf veins secondary to extreme, tight edema.  There is no propagation of DVT to the right lower extremity.   Called report to Dr. Georgana CurioWentz  Eidan Muellner, [facility] CenterCANDACE, RVT 05/22/2016, 5:43 PM

## 2016-05-22 NOTE — Discharge Instructions (Signed)
Information on my medicine - XARELTO (rivaroxaban)  This medication education was reviewed with me or my healthcare representative as part of my discharge preparation.    WHY WAS XARELTO PRESCRIBED FOR YOU? Xarelto was prescribed to treat blood clots that may have been found in the veins of your legs (deep vein thrombosis) or in your lungs (pulmonary embolism) and to reduce the risk of them occurring again.  What do you need to know about Xarelto? The starting dose is one 15 mg tablet taken TWICE daily with food for the FIRST 21 DAYS then on 06/13/2016 the dose is changed to one 20 mg tablet taken ONCE A DAY with your evening meal.  DO NOT stop taking Xarelto without talking to the health care provider who prescribed the medication.  Refill your prescription for 20 mg tablets before you run out.  After discharge, you should have regular check-up appointments with your healthcare provider that is prescribing your Xarelto.  In the future your dose may need to be changed if your kidney function changes by a significant amount.  What do you do if you miss a dose? If you are taking Xarelto TWICE DAILY and you miss a dose, take it as soon as you remember. You may take two 15 mg tablets (total 30 mg) at the same time then resume your regularly scheduled 15 mg twice daily the next day.  If you are taking Xarelto ONCE DAILY and you miss a dose, take it as soon as you remember on the same day then continue your regularly scheduled once daily regimen the next day. Do not take two doses of Xarelto at the same time.   Important Safety Information Xarelto is a blood thinner medicine that can cause bleeding. You should call your healthcare provider right away if you experience any of the following: ? Bleeding from an injury or your nose that does not stop. ? Unusual colored urine (red or dark brown) or unusual colored stools (red or black). ? Unusual bruising for unknown reasons. ? A serious fall  or if you hit your head (even if there is no bleeding).  Some medicines may interact with Xarelto and might increase your risk of bleeding while on Xarelto. To help avoid this, consult your healthcare provider or pharmacist prior to using any new prescription or non-prescription medications, including herbals, vitamins, non-steroidal anti-inflammatory drugs (NSAIDs) and supplements.  This website has more information on Xarelto: [email or web address].xarelto.com.

## 2016-05-22 NOTE — ED Notes (Signed)
Pharmacy tech at bedside reviewing the new medication, Bradley [name], that pt is being d/c home with

## 2016-05-22 NOTE — ED Notes (Signed)
Report  Phoned  To    Nurse  First

## 2016-05-22 NOTE — ED Triage Notes (Signed)
Pt here with significant left leg swelling and "large knot" on inner thigh. [facility] and was sent here for r/o DVT. Pt reports pain to same leg. HX diabetes, denies hx of blood clots.

## 2016-05-22 NOTE — ED Notes (Signed)
Pt d/c home via w/c.

## 2016-06-19 ENCOUNTER — Ambulatory Visit: Payer: Medicare Other | Attending: Internal Medicine | Admitting: Internal Medicine

## 2016-06-19 ENCOUNTER — Encounter: Payer: Self-pay | Admitting: Internal Medicine

## 2016-06-19 VITALS — BP 112/70 | HR 84 | Temp 97.9°F | Resp 16 | Wt 320.6 lb

## 2016-06-19 DIAGNOSIS — M7989 Other specified soft tissue disorders: Secondary | ICD-10-CM | POA: Diagnosis not present

## 2016-06-19 DIAGNOSIS — F17200 Nicotine dependence, unspecified, uncomplicated: Secondary | ICD-10-CM | POA: Insufficient documentation

## 2016-06-19 DIAGNOSIS — F319 Bipolar disorder, unspecified: Secondary | ICD-10-CM | POA: Insufficient documentation

## 2016-06-19 DIAGNOSIS — I82402 Acute embolism and thrombosis of unspecified deep veins of left lower extremity: Secondary | ICD-10-CM | POA: Diagnosis not present

## 2016-06-19 DIAGNOSIS — Z7901 Long term (current) use of anticoagulants: Secondary | ICD-10-CM | POA: Diagnosis not present

## 2016-06-19 DIAGNOSIS — Z7984 Long term (current) use of oral hypoglycemic drugs: Secondary | ICD-10-CM | POA: Insufficient documentation

## 2016-06-19 DIAGNOSIS — I824Z2 Acute embolism and thrombosis of unspecified deep veins of left distal lower extremity: Secondary | ICD-10-CM | POA: Diagnosis not present

## 2016-06-19 DIAGNOSIS — Z6841 Body Mass Index (BMI) 40.0 and over, adult: Secondary | ICD-10-CM | POA: Diagnosis not present

## 2016-06-19 DIAGNOSIS — F909 Attention-deficit hyperactivity disorder, unspecified type: Secondary | ICD-10-CM | POA: Insufficient documentation

## 2016-06-19 DIAGNOSIS — F17210 Nicotine dependence, cigarettes, uncomplicated: Secondary | ICD-10-CM | POA: Insufficient documentation

## 2016-06-19 DIAGNOSIS — Z9119 Patient's noncompliance with other medical treatment and regimen: Secondary | ICD-10-CM | POA: Insufficient documentation

## 2016-06-19 DIAGNOSIS — I10 Essential (primary) hypertension: Secondary | ICD-10-CM | POA: Diagnosis not present

## 2016-06-19 DIAGNOSIS — E119 Type 2 diabetes mellitus without complications: Secondary | ICD-10-CM | POA: Diagnosis not present

## 2016-06-19 LAB — POCT GLYCOSYLATED HEMOGLOBIN (HGB A1C): HEMOGLOBIN A1C: 12.3

## 2016-06-19 LAB — GLUCOSE, POCT (MANUAL RESULT ENTRY): POC Glucose: 310 mg/dl — AB (ref 70–99)

## 2016-06-19 MED ORDER — RIVAROXABAN 20 MG PO TABS: 20.0000 mg | ORAL_TABLET | Freq: Every day | ORAL | 4 refills | Status: DC

## 2016-06-19 MED ORDER — METFORMIN HCL 1000 MG PO TABS: 1000.0000 mg | ORAL_TABLET | Freq: Two times a day (BID) | ORAL | 3 refills | Status: DC

## 2016-06-19 NOTE — Progress Notes (Signed)
Bradley [name], is a 35 y.o. male  FHL:[id number]  HTD:[id number]  DOB - March 28, 1981  Chief Complaint  Patient presents with  . Hospitalization Follow-up  . Diabetes      Subjective:   Bradley [name] is a 35 y.o. male with history of Hypertension and type 2 diabetes mellitus with poor follow-up, lost to follow-up since January 2015, recently diagnosed with DVT and started on anticoagulant here today for a hospital discharge follow up visit. Patient has not been compliant with any of his medications and even exercise and diet regimen. He has not been on any medication for over 6 months. Today he did not want to talk about his diabetes or hypertension, he said he is just here for follow-up of his DVT and needs prescription assistance for Xarelto. He request free samples because he has no income and will not be able to afford any prescription. Luckily blood pressure is controlled without medications. He also has a history of ADHD and bipolar disorder as well as morbid obesity. He smokes about one pack of cigarettes per day. He is mostly sedentary and this may have been the trigger for his DVT because he has no recent air travel or prolonged sitting on a road travel. No known family history of clotting disorder. His left leg swelling is much improved, no redness. No fever. Patient has No headache, No chest pain, No abdominal pain - No Nausea, No new weakness tingling or numbness, No Cough - SOB.  Problem  Dvt (Deep Venous Thrombosis), Left  Smoking Addiction   ALLERGIES: No Known Allergies  PAST MEDICAL HISTORY: Past Medical History:  Diagnosis Date  . ADHD (attention deficit hyperactivity disorder)   . Bipolar disorder ([place])   . Diabetes mellitus without complication ([place])   . Morbid obesity ([place])     MEDICATIONS AT HOME: Prior to Admission medications   Medication Sig Start Date End Date Taking? Authorizing Provider  acetaminophen (TYLENOL) 325 MG tablet Take 650 mg by mouth every  8 (eight) hours as needed. For pain.   Yes Historical Provider, MD  albuterol (PROVENTIL HFA;VENTOLIN HFA) 108 (90 Base) MCG/ACT inhaler Inhale 1-2 puffs into the lungs every 6 (six) hours as needed for wheezing or shortness of breath. 02/23/16  Yes Konrad Felix, PA  atomoxetine (STRATTERA) 10 MG capsule Take 10 mg by mouth at bedtime.    Yes Historical Provider, MD  clotrimazole (LOTRIMIN) 1 % cream Apply to affected area 2 times daily 04/09/13  Yes Drenda Freeze, MD  glucose monitoring kit (FREESTYLE) monitoring kit 1 each by Does not apply route 4 (four) times daily - after meals and at bedtime. 1 month Diabetic Testing Supplies for QAC-QHS accuchecks. 07/27/13  Yes Thurnell Lose, MD  metFORMIN (GLUCOPHAGE) 1000 MG tablet Take 1 tablet (1,000 mg total) by mouth 2 (two) times daily with a meal. 06/19/16  Yes Narmeen Kerper E Doreene Burke, MD  topiramate (TOPAMAX) 100 MG tablet Take 100 mg by mouth 2 (two) times daily.     Yes Historical Provider, MD  traMADol (ULTRAM) 50 MG tablet Take 50 mg by mouth every 6 (six) hours as needed for pain.   Yes Historical Provider, MD  cephALEXin (KEFLEX) 500 MG capsule Take 1 capsule (500 mg total) by mouth 4 (four) times daily. 02/23/16   Konrad Felix, PA  clindamycin (CLEOCIN) 300 MG capsule Take 1 capsule (300 mg total) by mouth 4 (four) times daily. Patient not taking: Reported on 04/21/2016 08/17/14   Dionne Bucy  Kabbe, NP  hydrochlorothiazide (HYDRODIURIL) 25 MG tablet Take 1 tablet (25 mg total) by mouth daily. Patient not taking: Reported on 06/19/2016 10/03/13   Reyne Dumas, MD  rivaroxaban (XARELTO) 20 MG TABS tablet Take 1 tablet (20 mg total) by mouth daily with supper. 06/19/16   Tresa Garter, MD     Objective:   Vitals:   06/19/16 1543  BP: 112/70  Pulse: 84  Resp: 16  Temp: 97.9 F (36.6 C)  TempSrc: Oral  SpO2: 99%  Weight: (!) 320 lb 9.6 oz (145.4 kg)   Exam General appearance : Awake, alert, not in any distress. Speech Clear. Not  toxic looking, morbidly obese HEENT: Atraumatic and Normocephalic, pupils equally reactive to light and accomodation Neck: Supple, no JVD. No cervical lymphadenopathy.  Chest: Good air entry bilaterally, no added sounds  CVS: S1 S2 regular, no murmurs.  Abdomen: Bowel sounds present, Non tender and not distended with no gaurding, rigidity or rebound. Extremities: Swollen and edematous left leg up to the thigh. Both legs are warm to touch, pedal pulses ++ Neurology: Awake alert, and oriented X 3, CN II-XII intact, Non focal  Data Review Lab Results  Component Value Date   HGBA1C 12.3 06/19/2016   HGBA1C 6.2 10/03/2013   HGBA1C 6.0 07/27/2013     Assessment & Plan   1. Type 2 diabetes mellitus without complication, without long-term current use of insulin (HCC) Patient is non-adherent with medication, has not taken metformin in over 6 months - Glucose (CBG) - HgB A1c - Microalbumin/Creatinine Ratio, Urine Restart - metFORMIN (GLUCOPHAGE) 1000 MG tablet; Take 1 tablet (1,000 mg total) by mouth 2 (two) times daily with a meal.  Dispense: 180 tablet; Refill: 3  Aim for 30 minutes of exercise most days. Rethink what you drink. Water is great! Aim for 2-3 Carb Choices per meal (30-45 grams) +/- 1 either way  Aim for 0-15 Carbs per snack if hungry  Include protein in moderation with your meals and snacks  Consider reading food labels for Total Carbohydrate and Fat Grams of foods  Consider checking BG at alternate times per day  Continue taking medication as directed Be mindful about how much sugar you are adding to beverages and other foods. Fruit Punch - find one with no sugar  Measure and decrease portions of carbohydrate foods  Make your plate and don't go back for seconds  2. DVT (deep venous thrombosis), left: He will need anticoagulation for 6 months total.  - rivaroxaban (XARELTO) 20 MG TABS tablet; Take 1 tablet (20 mg total) by mouth daily with supper.  Dispense: 30  tablet; Refill: 4  3. Smoking addiction  Bradley [name] was counseled on the dangers of tobacco use, and was advised to quit. Reviewed strategies to maximize success, including removing cigarettes and smoking materials from environment, stress management and support of family/friends.  Patient have been counseled extensively about nutrition and exercise  Return in about 3 months (around 09/18/2016) for Hemoglobin A1C and Follow up, DM, DVT.  The patient was given clear instructions to go to ER or return to medical center if symptoms don't improve, worsen or new problems develop. The patient verbalized understanding. The patient was told to call to get lab results if they haven't heard anything in the next week.   This note has been created with Surveyor, quantity. Any transcriptional errors are unintentional.    Tehya Leath, MD, MHA, FACP, FAAP, [place] [facility]  [place], [place]   06/19/2016, 4:42 PM

## 2016-06-19 NOTE — Progress Notes (Signed)
Pt is in today for a hospital follow up  Pt has blood clot in left leg around the upper inner thigh  Pt has not eaten today  Pt has had medications today  Pt declines flu and tdap today

## 2016-06-19 NOTE — Patient Instructions (Signed)
Deep Vein Thrombosis A deep vein thrombosis (DVT) is a blood clot (thrombus) that usually occurs in a deep, larger vein of the lower leg or the pelvis, or in an upper extremity such as the arm. These are dangerous and can lead to serious and even life-threatening complications if the clot travels to the lungs. A DVT can damage the valves in your leg veins so that instead of flowing upward, the blood pools in the lower leg. This is called post-thrombotic syndrome, and it can result in pain, swelling, discoloration, and sores on the leg. CAUSES A DVT is caused by the formation of a blood clot in your leg, pelvis, or arm. Usually, several things contribute to the formation of blood clots. A clot may develop when:  Your blood flow slows down.  Your vein becomes damaged in some way.  You have a condition that makes your blood clot more easily. RISK FACTORS A DVT is more likely to develop in:  People who are older, especially over 57 years of age.  People who are overweight (obese).  People who sit or lie still for a long time, such as during long-distance travel (over 4 hours), bed rest, hospitalization, or during recovery from certain medical conditions like a stroke.  People who do not engage in much physical activity (sedentary lifestyle).  People who have chronic breathing disorders.  People who have a personal or family history of blood clots or blood clotting disease.  People who have peripheral vascular disease (PVD), diabetes, or some types of cancer.  People who have heart disease, especially if the person had a recent heart attack or has congestive heart failure.  People who have neurological diseases that affect the legs (leg paresis).  People who have had a traumatic injury, such as breaking a hip or leg.  People who have recently had major or lengthy surgery, especially on the hip, knee, or abdomen.  People who have had a central line placed inside a large vein.  People  who take medicines that contain the hormone estrogen. These include birth control pills and hormone replacement therapy.  Pregnancy or during childbirth or the postpartum period.  Long plane flights (over 8 hours). SIGNS AND SYMPTOMS Symptoms of a DVT can include:   Swelling of your leg or arm, especially if one side is much worse.  Warmth and redness of your leg or arm, especially if one side is much worse.  Pain in your arm or leg. If the clot is in your leg, symptoms may be more noticeable or worse when you stand or walk.  A feeling of pins and needles, if the clot is in the arm. The symptoms of a DVT that has traveled to the lungs (pulmonary embolism, PE) usually start suddenly and include:  Shortness of breath while active or at rest.  Coughing or coughing up blood or blood-tinged mucus.  Chest pain that is often worse with deep breaths.  Rapid or irregular heartbeat.  Feeling light-headed or dizzy.  Fainting.  Feeling anxious.  Sweating. There may also be pain and swelling in a leg if that is where the blood clot started. These symptoms may represent a serious problem that is an emergency. Do not wait to see if the symptoms will go away. Get medical help right away. Call your local emergency services (911 in the [place] Do not drive yourself to the hospital. DIAGNOSIS Your health care provider will take a medical history and perform a physical exam. You may also  have other tests, including:  Blood tests to assess the clotting properties of your blood.  Imaging tests, such as CT, ultrasound, MRI, X-ray, and other tests to see if you have clots anywhere in your body. TREATMENT After a DVT is identified, it can be treated. The type of treatment that you receive depends on many factors, such as the cause of your DVT, your risk for bleeding or developing more clots, and other medical conditions that you have. Sometimes, a combination of treatments is necessary. Treatment  options may be combined and include:  Monitoring the blood clot with ultrasound.  Taking medicines by mouth, such as newer blood thinners (anticoagulants), thrombolytics, or warfarin.  Taking anticoagulant medicine by injection or through an IV tube.  Wearing compression stockings or using different types ofdevices.  Surgery (rare) to remove the blood clot or to place a filter in your abdomen to stop the blood clot from traveling to your lungs. Treatments for a DVT are often divided into immediate treatment and long-term treatment (up to 3 months after DVT). You can work with your health care provider to choose the treatment program that is best for you. HOME CARE INSTRUCTIONS If you are taking a newer oral anticoagulant:  Take the medicine every single day at the same time each day.  Understand what foods and drugs interact with this medicine.  Understand that there are no regular blood tests required when using this medicine.  Understand the side effects of this medicine, including excessive bruising or bleeding. Ask your health care provider or pharmacist about other possible side effects. If you are taking warfarin:  Understand how to take warfarin and know which foods can affect how warfarin works in Veterinary surgeon.  Understand that it is dangerous to take too much or too little warfarin. Too much warfarin increases the risk of bleeding. Too little warfarin continues to allow the risk for blood clots.  Follow your PT and INR blood testing schedule. The PT and INR results allow your health care provider to adjust your dose of warfarin. It is very important that you have your PT and INR tested as often as told by your health care provider.  Avoid major changes in your diet, or tell your health care provider before you change your diet. Arrange a visit with a registered dietitian to answer your questions. Many foods, especially foods that are high in vitamin K, can interfere with warfarin  and affect the PT and INR results. Eat a consistent amount of foods that are high in vitamin K, such as:  Spinach, kale, broccoli, cabbage, collard greens, turnip greens, Brussels sprouts, peas, cauliflower, seaweed, and parsley.  Beef liver and pork liver.  Green tea.  Soybean oil.  Tell your health care provider about any and all medicines, vitamins, and supplements that you take, including aspirin and other over-the-counter anti-inflammatory medicines. Be especially cautious with aspirin and anti-inflammatory medicines. Do not take those before you ask your health care provider if it is safe to do so. This is important because many medicines can interfere with warfarin and affect the PT and INR results.  Do not start or stop taking any over-the-counter or prescription medicine unless your health care provider or pharmacist tells you to do so. If you take warfarin, you will also need to do these things:  Hold pressure over cuts for longer than usual.  Tell your dentist and other health care providers that you are taking warfarin before you have any procedures in which  bleeding may occur.  Avoid alcohol or drink very small amounts. Tell your health care provider if you change your alcohol intake.  Do not use tobacco products, including cigarettes, chewing tobacco, and e-cigarettes. If you need help quitting, ask your health care provider.  Avoid contact sports. General Instructions  Take over-the-counter and prescription medicines only as told by your health care provider. Anticoagulant medicines can have side effects, including easy bruising and difficulty stopping bleeding. If you are prescribed an anticoagulant, you will also need to do these things:  Hold pressure over cuts for longer than usual.  Tell your dentist and other health care providers that you are taking anticoagulants before you have any procedures in which bleeding may occur.  Avoid contact sports.  Wear a medical  alert bracelet or carry a medical alert card that says you have had a PE.  Ask your health care provider how soon you can go back to your normal activities. Stay active to prevent new blood clots from forming.  Make sure to exercise while traveling or when you have been sitting or standing for a long period of time. It is very important to exercise. Exercise your legs by walking or by tightening and relaxing your leg muscles often. Take frequent walks.  Wear compression stockings as told by your health care provider to help prevent more blood clots from forming.  Do not use tobacco products, including cigarettes, chewing tobacco, and e-cigarettes. If you need help quitting, ask your health care provider.  Keep all follow-up appointments with your health care provider. This is important. PREVENTION Take these actions to decrease your risk of developing another DVT:  Exercise regularly. For at least 30 minutes every day, engage in:  Activity that involves moving your arms and legs.  Activity that encourages good blood flow through your body by increasing your heart rate.  Exercise your arms and legs every hour during long-distance travel (over 4 hours). Drink plenty of water and avoid drinking alcohol while traveling.  Avoid sitting or lying in bed for long periods of time without moving your legs.  Maintain a weight that is appropriate for your height. Ask your health care provider what weight is healthy for you.  If you are a woman who is over 11 years of age, avoid unnecessary use of medicines that contain estrogen. These include birth control pills.  Do not smoke, especially if you take estrogen medicines. If you need help quitting, ask your health care provider. If you are hospitalized, prevention measures may include:  Early walking after surgery, as soon as your health care provider says that it is safe.  Receiving anticoagulants to prevent blood clots.If you cannot take  anticoagulants, other options may be available, such as wearing compression stockings or using different types of devices. SEEK IMMEDIATE MEDICAL CARE IF:  You have new or increased pain, swelling, or redness in an arm or leg.  You have numbness or tingling in an arm or leg.  You have shortness of breath while active or at rest.  You have chest pain.  You have a rapid or irregular heartbeat.  You feel light-headed or dizzy.  You cough up blood.  You notice blood in your vomit, bowel movement, or urine. These symptoms may represent a serious problem that is an emergency. Do not wait to see if the symptoms will go away. Get medical help right away. Call your local emergency services (911 in the [place] Do not drive yourself to the hospital.  This information is not intended to replace advice given to you by your health care provider. Make sure you discuss any questions you have with your health care provider.   Document Released: 09/15/2005 Document Revised: 06/06/2015 Document Reviewed: 01/10/2015 Elsevier Interactive Patient Education 2016 [employer]. Diabetes and Exercise Exercising regularly is important. It is not just about losing weight. It has many health benefits, such as:  Improving your overall fitness, flexibility, and endurance.  Increasing your bone density.  Helping with weight control.  Decreasing your body fat.  Increasing your muscle strength.  Reducing stress and tension.  Improving your overall health. People with diabetes who exercise gain additional benefits because exercise:  Reduces appetite.  Improves the body's use of blood sugar (glucose).  Helps lower or control blood glucose.  Decreases blood pressure.  Helps control blood lipids (such as cholesterol and triglycerides).  Improves the body's use of the hormone insulin by:  Increasing the body's insulin sensitivity.  Reducing the body's insulin needs.  Decreases the risk for heart  disease because exercising:  Lowers cholesterol and triglycerides levels.  Increases the levels of good cholesterol (such as high-density lipoproteins [HDL]) in the body.  Lowers blood glucose levels. YOUR ACTIVITY PLAN  Choose an activity that you enjoy, and set realistic goals. To exercise safely, you should begin practicing any new physical activity slowly, and gradually increase the intensity of the exercise over time. Your health care provider or diabetes educator can help create an activity plan that works for you. General recommendations include:  Encouraging children to engage in at least 60 minutes of physical activity each day.  Stretching and performing strength training exercises, such as yoga or weight lifting, at least 2 times per week.  Performing a total of at least 150 minutes of moderate-intensity exercise each week, such as brisk walking or water aerobics.  Exercising at least 3 days per week, making sure you allow no more than 2 consecutive days to pass without exercising.  Avoiding long periods of inactivity (90 minutes or more). When you have to spend an extended period of time sitting down, take frequent breaks to walk or stretch. RECOMMENDATIONS FOR EXERCISING WITH TYPE 1 OR TYPE 2 DIABETES   Check your blood glucose before exercising. If blood glucose levels are greater than 240 mg/dL, check for urine ketones. Do not exercise if ketones are present.  Avoid injecting insulin into areas of the body that are going to be exercised. For example, avoid injecting insulin into:  The arms when playing tennis.  The legs when jogging.  Keep a record of:  Food intake before and after you exercise.  Expected peak times of insulin action.  Blood glucose levels before and after you exercise.  The type and amount of exercise you have done.  Review your records with your health care provider. Your health care provider will help you to develop guidelines for adjusting  food intake and insulin amounts before and after exercising.  If you take insulin or oral hypoglycemic agents, watch for signs and symptoms of hypoglycemia. They include:  Dizziness.  Shaking.  Sweating.  Chills.  Confusion.  Drink plenty of water while you exercise to prevent dehydration or heat stroke. Body water is lost during exercise and must be replaced.  Talk to your health care provider before starting an exercise program to make sure it is safe for you. Remember, almost any type of activity is better than none.   This information is not intended to replace  advice given to you by your health care provider. Make sure you discuss any questions you have with your health care provider.   Document Released: 12/06/2003 Document Revised: 01/30/2015 Document Reviewed: 02/22/2013 Elsevier Interactive Patient Education 2016 Elsevier Inc. Basic Carbohydrate Counting for Diabetes Mellitus Carbohydrate counting is a method for keeping track of the amount of carbohydrates you eat. Eating carbohydrates naturally increases the level of sugar (glucose) in your blood, so it is important for you to know the amount that is okay for you to have in every meal. Carbohydrate counting helps keep the level of glucose in your blood within normal limits. The amount of carbohydrates allowed is different for every person. A dietitian can help you calculate the amount that is right for you. Once you know the amount of carbohydrates you can have, you can count the carbohydrates in the foods you want to eat. Carbohydrates are found in the following foods:  Grains, such as breads and cereals.  Dried beans and soy products.  Starchy vegetables, such as potatoes, peas, and corn.  Fruit and fruit juices.  Milk and yogurt.  Sweets and snack foods, such as cake, cookies, candy, chips, soft drinks, and fruit drinks. CARBOHYDRATE COUNTING There are two ways to count the carbohydrates in your food. You can use  either of the methods or a combination of both. Reading the "Nutrition Facts" on Packaged Food The "Nutrition Facts" is an area that is included on the labels of almost all packaged food and beverages in the [place]. It includes the serving size of that food or beverage and information about the nutrients in each serving of the food, including the grams (g) of carbohydrate per serving.  Decide the number of servings of this food or beverage that you will be able to eat or drink. Multiply that number of servings by the number of grams of carbohydrate that is listed on the label for that serving. The total will be the amount of carbohydrates you will be having when you eat or drink this food or beverage. Learning Standard Serving Sizes of Food When you eat food that is not packaged or does not include "Nutrition Facts" on the label, you need to measure the servings in order to count the amount of carbohydrates.A serving of most carbohydrate-rich foods contains about 15 g of carbohydrates. The following list includes serving sizes of carbohydrate-rich foods that provide 15 g ofcarbohydrate per serving:   1 slice of bread (1 oz) or 1 six-inch tortilla.    of a hamburger bun or English muffin.  4-6 crackers.   cup unsweetened dry cereal.    cup hot cereal.   cup rice or pasta.    cup mashed potatoes or  of a large baked potato.  1 cup fresh fruit or one small piece of fruit.    cup canned or frozen fruit or fruit juice.  1 cup milk.   cup plain fat-free yogurt or yogurt sweetened with artificial sweeteners.   cup cooked dried beans or starchy vegetable, such as peas, corn, or potatoes.  Decide the number of standard-size servings that you will eat. Multiply that number of servings by 15 (the grams of carbohydrates in that serving). For example, if you eat 2 cups of strawberries, you will have eaten 2 servings and 30 g of carbohydrates (2 servings x 15 g = 30 g). For  foods such as soups and casseroles, in which more than one food is mixed in, you will need to  count the carbohydrates in each food that is included. EXAMPLE OF CARBOHYDRATE COUNTING Sample Dinner  3 oz chicken breast.   cup of brown rice.   cup of corn.  1 cup milk.   1 cup strawberries with sugar-free whipped topping.  Carbohydrate Calculation Step 1: Identify the foods that contain carbohydrates:   Rice.   Corn.   Milk.   Strawberries. Step 2:Calculate the number of servings eaten of each:   2 servings of rice.   1 serving of corn.   1 serving of milk.   1 serving of strawberries. Step 3: Multiply each of those number of servings by 15 g:   2 servings of rice x 15 g = 30 g.   1 serving of corn x 15 g = 15 g.   1 serving of milk x 15 g = 15 g.   1 serving of strawberries x 15 g = 15 g. Step 4: Add together all of the amounts to find the total grams of carbohydrates eaten: 30 g + 15 g + 15 g + 15 g = 75 g.   This information is not intended to replace advice given to you by your health care provider. Make sure you discuss any questions you have with your health care provider.   Document Released: 09/15/2005 Document Revised: 10/06/2014 Document Reviewed: 08/12/2013 Elsevier Interactive Patient Education [employer] Inc[employer]

## 2016-06-20 LAB — MICROALBUMIN / CREATININE URINE RATIO
Creatinine, Urine: 79 mg/dL (ref 20–370)
Microalb Creat Ratio: 6 mcg/mg creat (ref <30)
Microalb, Ur: 0.5 mg/dL

## 2016-06-20 MED FILL — metFORMIN HCL 1000 MG TABS: 1000 | 30 days supply | Qty: 60 | Fill #0

## 2016-06-20 MED FILL — XARELTO 20 MG TABLET: 20 | 30 days supply | Qty: 30 | Fill #0

## 2016-07-17 MED FILL — XARELTO 20 MG TABLET: 20 | 30 days supply | Qty: 30 | Fill #1

## 2016-08-19 MED FILL — XARELTO 20 MG TABLET: 20 | 30 days supply | Qty: 30 | Fill #2

## 2016-08-20 ENCOUNTER — Telehealth: Payer: Self-pay | Admitting: Internal Medicine

## 2016-08-20 MED ORDER — RIVAROXABAN 20 MG PO TABS
20.0000 mg | ORAL_TABLET | Freq: Every day | ORAL | 2 refills | Status: DC
Start: 2016-08-20 — End: 2016-08-25

## 2016-08-20 NOTE — Telephone Encounter (Signed)
[facility]

## 2016-08-20 NOTE — Telephone Encounter (Signed)
Patient called the pharmacy to have his prescription, rivaroxaban (XARELTO) 20 MG TABS tablet, refilled. Because our pharmacy will be closed on Friday patient is [facility] [address]   Thank you.

## 2016-08-25 ENCOUNTER — Other Ambulatory Visit: Payer: Self-pay | Admitting: Pharmacist

## 2016-08-25 MED ORDER — RIVAROXABAN 20 MG PO TABS: 20.0000 mg | ORAL_TABLET | Freq: Every day | ORAL | 2 refills | Status: DC

## 2016-08-29 ENCOUNTER — Ambulatory Visit: Payer: Medicare Other

## 2016-09-18 MED FILL — XARELTO 20 MG TABLET: 20 | 30 days supply | Qty: 30 | Fill #3

## 2016-10-21 MED FILL — XARELTO 20 MG TABLET: 20 | 30 days supply | Qty: 30 | Fill #4

## 2016-11-12 ENCOUNTER — Encounter: Payer: Self-pay | Admitting: Internal Medicine

## 2016-11-12 ENCOUNTER — Ambulatory Visit: Payer: Medicare Other | Attending: Internal Medicine | Admitting: Internal Medicine

## 2016-11-12 VITALS — BP 96/69 | HR 96 | Temp 98.1°F | Resp 18 | Ht 72.0 in | Wt 290.0 lb

## 2016-11-12 DIAGNOSIS — E119 Type 2 diabetes mellitus without complications: Secondary | ICD-10-CM

## 2016-11-12 DIAGNOSIS — F17210 Nicotine dependence, cigarettes, uncomplicated: Secondary | ICD-10-CM | POA: Diagnosis not present

## 2016-11-12 DIAGNOSIS — I10 Essential (primary) hypertension: Secondary | ICD-10-CM | POA: Insufficient documentation

## 2016-11-12 DIAGNOSIS — I825Y2 Chronic embolism and thrombosis of unspecified deep veins of left proximal lower extremity: Secondary | ICD-10-CM | POA: Diagnosis not present

## 2016-11-12 DIAGNOSIS — F17200 Nicotine dependence, unspecified, uncomplicated: Secondary | ICD-10-CM

## 2016-11-12 DIAGNOSIS — Z7901 Long term (current) use of anticoagulants: Secondary | ICD-10-CM | POA: Insufficient documentation

## 2016-11-12 DIAGNOSIS — F909 Attention-deficit hyperactivity disorder, unspecified type: Secondary | ICD-10-CM | POA: Diagnosis not present

## 2016-11-12 DIAGNOSIS — Z7984 Long term (current) use of oral hypoglycemic drugs: Secondary | ICD-10-CM | POA: Insufficient documentation

## 2016-11-12 DIAGNOSIS — E1165 Type 2 diabetes mellitus with hyperglycemia: Secondary | ICD-10-CM

## 2016-11-12 DIAGNOSIS — F319 Bipolar disorder, unspecified: Secondary | ICD-10-CM | POA: Diagnosis not present

## 2016-11-12 LAB — POCT GLYCOSYLATED HEMOGLOBIN (HGB A1C): Hemoglobin A1C: 12.7

## 2016-11-12 LAB — TSH: TSH: 2.06 m[IU]/L (ref 0.40–4.50)

## 2016-11-12 LAB — GLUCOSE, POCT (MANUAL RESULT ENTRY)
POC GLUCOSE: 405 mg/dL — AB (ref 70–99)
POC GLUCOSE: 362 mg/dL — AB (ref 70–99)

## 2016-11-12 LAB — POCT URINALYSIS DIPSTICK
Bilirubin, UA: NEGATIVE
Blood, UA: NEGATIVE
Glucose, UA: 500
KETONES UA: NEGATIVE
LEUKOCYTES UA: NEGATIVE
Nitrite, UA: NEGATIVE
PH UA: 5
Protein, UA: NEGATIVE
Spec Grav, UA: <=1.005
Urobilinogen, UA: 0.2

## 2016-11-12 MED ORDER — METFORMIN HCL ER (OSM) 1000 MG PO TB24: 1000.0000 mg | ORAL_TABLET | Freq: Every day | ORAL | 3 refills | Status: DC

## 2016-11-12 MED ORDER — TRAMADOL HCL 50 MG PO TABS: 50.0000 mg | ORAL_TABLET | Freq: Four times a day (QID) | ORAL | 0 refills | Status: DC | PRN

## 2016-11-12 MED ORDER — INSULIN ASPART 100 UNIT/ML ~~LOC~~ SOLN
20.0000 [IU] | Freq: Once | SUBCUTANEOUS | Status: AC
  Administered 2016-11-12: 20 [IU] via SUBCUTANEOUS

## 2016-11-12 MED ORDER — RIVAROXABAN 20 MG PO TABS: 20.0000 mg | ORAL_TABLET | Freq: Every day | ORAL | 2 refills | Status: DC

## 2016-11-12 MED FILL — traMADol HCL 50 MG TABS: 50 | 15 days supply | Qty: 60 | Fill #0

## 2016-11-12 NOTE — Progress Notes (Signed)
Patient is here for FU DM  Patient complains of left leg pain from previous DVT.  Patient has taken medication today. Patient has eaten today.  Patient declined the flu vaccine today.

## 2016-11-12 NOTE — Progress Notes (Signed)
Subjective:    Patient ID: Bradley [name], male    DOB: 1981/02/20, 36 y.o.   MRN: [id number]  HPI Bradley [name] is a 36 y.o. male with history of Hypertension and type 2 diabetes mellitus (A1C 12.3) , diagnosed with DVT and started on anticoagulant here today for follow up on his medical problems. Patient has not been compliant with his diabetes medications and even exercise and diet regimen. He reports stopping his Metformin because of diarrhea and bowel incontinience. He also has a history of ADHD and bipolar disorder as well as morbid obesity. He smokes about one pack of cigarettes per day, he is mostly sedentary. He is not an every day alcohol drinker. He complained of left leg swelling and pain from the DVT but swelling has reduced significantly, no warmth, no redness. No fever. He currently reports a 6/10 pain in the left leg. Denies headache, chest pain, or nausea.   Past Medical History:  Diagnosis Date  . ADHD (attention deficit hyperactivity disorder)   . Bipolar disorder ([place])   . Diabetes mellitus without complication ([place])   . Morbid obesity ([place])    Current Outpatient Prescriptions on File Prior to Visit  Medication Sig Dispense Refill  . acetaminophen (TYLENOL) 325 MG tablet Take 650 mg by mouth every 8 (eight) hours as needed. For pain.    Marland Kitchen albuterol (PROVENTIL HFA;VENTOLIN HFA) 108 (90 Base) MCG/ACT inhaler Inhale 1-2 puffs into the lungs every 6 (six) hours as needed for wheezing or shortness of breath. 1 Inhaler 0  . atomoxetine (STRATTERA) 10 MG capsule Take 10 mg by mouth at bedtime.     . clotrimazole (LOTRIMIN) 1 % cream Apply to affected area 2 times daily 15 g 0  . glucose monitoring kit (FREESTYLE) monitoring kit 1 each by Does not apply route 4 (four) times daily - after meals and at bedtime. 1 month Diabetic Testing Supplies for QAC-QHS accuchecks. 1 each 1  . metFORMIN (GLUCOPHAGE) 1000 MG tablet Take 1 tablet (1,000 mg total) by mouth 2 (two) times daily  with a meal. 180 tablet 3  . rivaroxaban (XARELTO) 20 MG TABS tablet Take 1 tablet (20 mg total) by mouth daily with supper. 30 tablet 2  . topiramate (TOPAMAX) 100 MG tablet Take 100 mg by mouth 2 (two) times daily.      . traMADol (ULTRAM) 50 MG tablet Take 50 mg by mouth every 6 (six) hours as needed for pain.     No current facility-administered medications on file prior to visit.    Review of Systems  Constitutional: Negative for appetite change and unexpected weight change.  HENT: Positive for congestion.   Eyes: Negative for photophobia and visual disturbance.  Respiratory: Negative for cough, chest tightness and shortness of breath.   Cardiovascular: Negative for palpitations and leg swelling.  Gastrointestinal: Negative for abdominal pain, blood in stool, constipation and diarrhea.  Endocrine: Negative for polydipsia, polyphagia and polyuria.  Genitourinary: Negative for dysuria.  Musculoskeletal: Negative for back pain, joint swelling and myalgias.  Skin: Negative.       Objective:  BP 96/69 (BP Location: Right Arm, Patient Position: Sitting, Cuff Size: Large)   Pulse 96   Temp 98.1 F (36.7 C) (Oral)   Resp 18   Ht 6' (1.829 m)   Wt 290 lb (131.5 kg)   SpO2 97%   BMI 39.33 kg/m   HgbA1C 12.7 up from 12.3 at last visit Random CBG 406, Recheck after  20 units of  Novolog 362   Physical Exam  Constitutional: He is oriented to person, place, and time. He appears well-developed and well-nourished. No distress.  HENT:  Head: Normocephalic.  Mouth/Throat: Oropharynx is clear and moist.  Poor dental hygeine  Eyes: Conjunctivae and EOM are normal. Pupils are equal, round, and reactive to light.  Neck: Normal range of motion. Neck supple. No thyromegaly present.  Cardiovascular: Normal rate, regular rhythm, normal heart sounds and intact distal pulses.   No murmur heard. Pulmonary/Chest: Effort normal and breath sounds normal. No respiratory distress.  Abdominal: There  is no tenderness.  Abdomen is obese  Musculoskeletal: Normal range of motion. He exhibits tenderness (left thigh). He exhibits no edema or deformity.  Lymphadenopathy:    He has no cervical adenopathy.  Neurological: He is alert and oriented to person, place, and time.  Skin: Skin is warm and dry.  Varicose veins on bilateral legs      Assessment & Plan:   1. Type 2 diabetes mellitus without complication, without long-term current use of insulin (HCC)  Uncontrolled, A1C 12.7 today, up from 12.3 five months ago Patient not taking previous prescription of Metformin due to side effect, med changed to extended release. Patient to call if symptoms persisit Low car diet discussed and encouraged  - POCT A1C - Glucose (CBG) - Urinalysis Dipstick - metformin (FORTAMET) 1000 MG (OSM) 24 hr tablet; Take 1 tablet (1,000 mg total) by mouth daily with breakfast.  Dispense: 30 tablet; Refill: 3 - COMPLETE METABOLIC PANEL WITH GFR - Lipid panel - TSH  Aim for 30 minutes of exercise most days. Rethink what you drink. Water is great! Aim for 2-3 Carb Choices per meal (30-45 grams) +/- 1 either way  Aim for 0-15 Carbs per snack if hungry  Include protein in moderation with your meals and snacks  Consider reading food labels for Total Carbohydrate and Fat Grams of foods  Consider checking BG at alternate times per day  Continue taking medication as directed Be mindful about how much sugar you are adding to beverages and other foods. Fruit Punch - find one with no sugar  Measure and decrease portions of carbohydrate foods  Make your plate and don't go back for seconds  2. Smoking addiction  Smoking caseation discussed, barriers discussed The patient is sincerely urged to quit smoking. The numerous direct health benefits are discussed. If he decides to quit, there are a number of helpful adjunctive aids, and he can see me to discuss nicotine replacement therapy and bupropion anytime in the  future. Refused information at this time, wants to quit on his own   3. Chronic deep vein thrombosis (DVT) of proximal vein of left lower extremity (HCC)  - rivaroxaban (XARELTO) 20 MG TABS tablet; Take 1 tablet (20 mg total) by mouth daily with supper.  Dispense: 30 tablet; Refill: 2 - traMADol (ULTRAM) 50 MG tablet; Take 1 tablet (50 mg total) by mouth every 6 (six) hours as needed.  Dispense: 60 tablet; Refill: 0  Labs pending Health maintenance reviewed Diet and exercise encouraged Continue all meds Follow up  in 3 months  Jari Favre, RN, BSN, AGNP-Student  Evaluation and management procedures were performed by me with DNP Student in attendance, note written by DNP student under my supervision and collaboration. I have reviewed the note and I agree with the management and plan.   Angelica Chessman, MD, [place], [place], [place], [place] and [place], [place]   11/13/2016, 12:53  PM

## 2016-11-12 NOTE — Patient Instructions (Signed)
Diabetes and Foot Care Diabetes may cause you to have problems because of poor blood supply (circulation) to your feet and legs. This may cause the skin on your feet to become thinner, break easier, and heal more slowly. Your skin may become dry, and the skin may peel and crack. You may also have nerve damage in your legs and feet causing decreased feeling in them. You may not notice minor injuries to your feet that could lead to infections or more serious problems. Taking care of your feet is one of the most important things you can do for yourself. Follow these instructions at home:  Wear shoes at all times, even in the house. Do not go barefoot. Bare feet are easily injured.  Check your feet daily for blisters, cuts, and redness. If you cannot see the bottom of your feet, use a mirror or ask someone for help.  Wash your feet with warm water (do not use hot water) and mild soap. Then pat your feet and the areas between your toes until they are completely dry. Do not soak your feet as this can dry your skin.  Apply a moisturizing lotion or petroleum jelly (that does not contain alcohol and is unscented) to the skin on your feet and to dry, brittle toenails. Do not apply lotion between your toes.  Trim your toenails straight across. Do not dig under them or around the cuticle. File the edges of your nails with an emery board or nail file.  Do not cut corns or calluses or try to remove them with medicine.  Wear clean socks or stockings every day. Make sure they are not too tight. Do not wear knee-high stockings since they may decrease blood flow to your legs.  Wear shoes that fit properly and have enough cushioning. To break in new shoes, wear them for just a few hours a day. This prevents you from injuring your feet. Always look in your shoes before you put them on to be sure there are no objects inside.  Do not cross your legs. This may decrease the blood flow to your feet.  If you find a  minor scrape, cut, or break in the skin on your feet, keep it and the skin around it clean and dry. These areas may be cleansed with mild soap and water. Do not cleanse the area with peroxide, alcohol, or iodine.  When you remove an adhesive bandage, be sure not to damage the skin around it.  If you have a wound, look at it several times a day to make sure it is healing.  Do not use heating pads or hot water bottles. They may burn your skin. If you have lost feeling in your feet or legs, you may not know it is happening until it is too late.  Make sure your health care provider performs a complete foot exam at least annually or more often if you have foot problems. Report any cuts, sores, or bruises to your health care provider immediately. Contact a health care provider if:  You have an injury that is not healing.  You have cuts or breaks in the skin.  You have an ingrown nail.  You notice redness on your legs or feet.  You feel burning or tingling in your legs or feet.  You have pain or cramps in your legs and feet.  Your legs or feet are numb.  Your feet always feel cold. Get help right away if:  There is increasing   redness, swelling, or pain in or around a wound.  There is a red line that goes up your leg.  Pus is coming from a wound.  You develop a fever or as directed by your health care provider.  You notice a bad smell coming from an ulcer or wound. This information is not intended to replace advice given to you by your health care provider. Make sure you discuss any questions you have with your health care provider. Document Released: 09/12/2000 Document Revised: 02/21/2016 Document Reviewed: 02/22/2013 Elsevier Interactive Patient Education  2017 [employer] Blood Glucose Monitoring, Adult Monitoring your blood sugar (glucose) helps you manage your diabetes. It also helps you and your health care provider determine how well your diabetes management plan is  working. Blood glucose monitoring involves checking your blood glucose as often as directed, and keeping a record (log) of your results over time. Why should I monitor my blood glucose? Checking your blood glucose regularly can:  Help you understand how food, exercise, illnesses, and medicines affect your blood glucose.  Let you know what your blood glucose is at any time. You can quickly tell if you are having low blood glucose (hypoglycemia) or high blood glucose (hyperglycemia).  Help you and your health care provider adjust your medicines as needed. When should I check my blood glucose? Follow instructions from your health care provider about how often to check your blood glucose. This may depend on:  The type of diabetes you have.  How well-controlled your diabetes is.  Medicines you are taking. If you have type 1 diabetes:  Check your blood glucose at least 2 times a day.  Also check your blood glucose:  Before every insulin injection.  Before and after exercise.  Between meals.  2 hours after a meal.  Occasionally between 2:00 a.m. and 3:00 a.m., as directed.  Before potentially dangerous tasks, like driving or using heavy machinery.  At bedtime.  You may need to check your blood glucose more often, up to 6-10 times a day:  If you use an insulin pump.  If you need multiple daily injections (MDI).  If your diabetes is not well-controlled.  If you are ill.  If you have a history of severe hypoglycemia.  If you have a history of not knowing when your blood glucose is getting low (hypoglycemia unawareness). If you have type 2 diabetes:  If you take insulin or other diabetes medicines, check your blood glucose at least 2 times a day.  If you are on intensive insulin therapy, check your blood glucose at least 4 times a day. Occasionally, you may also need to check between 2:00 a.m. and 3:00 a.m., as directed.  Also check your blood glucose:  Before and after  exercise.  Before potentially dangerous tasks, like driving or using heavy machinery.  You may need to check your blood glucose more often if:  Your medicine is being adjusted.  Your diabetes is not well-controlled.  You are ill. What is a blood glucose log?  A blood glucose log is a record of your blood glucose readings. It helps you and your health care provider:  Look for patterns in your blood glucose over time.  Adjust your diabetes management plan as needed.  Every time you check your blood glucose, write down your result and notes about things that may be affecting your blood glucose, such as your diet and exercise for the day.  Most glucose meters store a record of glucose readings in   the meter. Some meters allow you to download your records to a computer. How do I check my blood glucose? Follow these steps to get accurate readings of your blood glucose: Supplies needed   Blood glucose meter.  Test strips for your meter. Each meter has its own strips. You must use the strips that come with your meter.  A needle to prick your finger (lancet). Do not use lancets more than once.  A device that holds the lancet (lancing device).  A journal or log book to write down your results. Procedure  Wash your hands with soap and water.  Prick the side of your finger (not the tip) with the lancet. Use a different finger each time.  Gently rub the finger until a small drop of blood appears.  Follow instructions that come with your meter for inserting the test strip, applying blood to the strip, and using your blood glucose meter.  Write down your result and any notes. Alternative testing sites  Some meters allow you to use areas of your body other than your finger (alternative sites) to test your blood.  If you think you may have hypoglycemia, or if you have hypoglycemia unawareness, do not use alternative sites. Use your finger instead.  Alternative sites may not be as  accurate as the fingers, because blood flow is slower in these areas. This means that the result you get may be delayed, and it may be different from the result that you would get from your finger.  The most common alternative sites are:  Forearm.  Thigh.  Palm of the hand. Additional tips  Always keep your supplies with you.  If you have questions or need help, all blood glucose meters have a 24-hour "hotline" number that you can call. You may also contact your health care provider.  After you use a few boxes of test strips, adjust (calibrate) your blood glucose meter by following instructions that came with your meter. This information is not intended to replace advice given to you by your health care provider. Make sure you discuss any questions you have with your health care provider. Document Released: 09/18/2003 Document Revised: 04/04/2016 Document Reviewed: 02/25/2016 Elsevier Interactive Patient Education  2017 [employer] Inc[employer]  

## 2016-11-13 LAB — LIPID PANEL
Cholesterol: 153 mg/dL (ref <200)
HDL: 38 mg/dL — ABNORMAL LOW (ref >40)
LDL CALC: 87 mg/dL (ref <100)
Total CHOL/HDL Ratio: 4 Ratio (ref <5.0)
Triglycerides: 142 mg/dL (ref <150)
VLDL: 28 mg/dL (ref <30)

## 2016-11-13 LAB — COMPLETE METABOLIC PANEL WITH GFR
ALT: 29 U/L (ref 9–46)
AST: 22 U/L (ref 10–40)
Albumin: 4.3 g/dL (ref 3.6–5.1)
Alkaline Phosphatase: 69 U/L (ref 40–115)
BUN: 11 mg/dL (ref 7–25)
CHLORIDE: 98 mmol/L (ref 98–110)
CO2: 26 mmol/L (ref 20–31)
CREATININE: 0.82 mg/dL (ref 0.60–1.35)
Calcium: 9.6 mg/dL (ref 8.6–10.3)
GFR, Est African American: >89 mL/min (ref >=60)
GFR, Est Non African American: >89 mL/min (ref >=60)
Glucose, Bld: 373 mg/dL — ABNORMAL HIGH (ref 65–99)
Potassium: 4.2 mmol/L (ref 3.5–5.3)
Sodium: 135 mmol/L (ref 135–146)
TOTAL PROTEIN: 7.2 g/dL (ref 6.1–8.1)
Total Bilirubin: 1 mg/dL (ref 0.2–1.2)

## 2016-11-13 MED ORDER — METFORMIN HCL ER 500 MG PO TB24: ORAL_TABLET | ORAL | 3 refills | Status: DC

## 2016-11-13 MED ORDER — METFORMIN HCL ER (OSM) 500 MG PO TB24: 1000.0000 mg | ORAL_TABLET | Freq: Every day | ORAL | 3 refills | Status: DC

## 2016-11-13 MED FILL — METFORMIN HCL ER 500 MG TAB: 500 | 30 days supply | Qty: 60 | Fill #0

## 2016-11-14 MED FILL — XARELTO 20 MG TABLET: 20 | 30 days supply | Qty: 30 | Fill #0

## 2016-11-17 ENCOUNTER — Telehealth: Payer: Self-pay | Admitting: *Deleted

## 2016-11-17 NOTE — Telephone Encounter (Signed)
MA informed patient via voicemail of his lab results being normal except for DM. Patient advised to take medications as prescribed and to implement a low carb diet as well as exercise 3 times a week with 30 minute intervals. [facility] provided [facility] for any questions.

## 2016-11-17 NOTE — Telephone Encounter (Signed)
-----   Message from Quentin Angstlugbemiga E Jegede, MD sent at 11/13/2016  9:07 AM EST ----- Lab results are normal except for blood sugar. Please adhere with medications as prescribed, check blood sugar regularly and follow up as scheduled. Low carbohydrate diet and regular physical exercise.

## 2016-11-18 ENCOUNTER — Encounter (HOSPITAL_COMMUNITY): Payer: Self-pay

## 2016-11-18 DIAGNOSIS — I10 Essential (primary) hypertension: Secondary | ICD-10-CM | POA: Diagnosis not present

## 2016-11-18 DIAGNOSIS — F909 Attention-deficit hyperactivity disorder, unspecified type: Secondary | ICD-10-CM | POA: Insufficient documentation

## 2016-11-18 DIAGNOSIS — R079 Chest pain, unspecified: Secondary | ICD-10-CM | POA: Diagnosis not present

## 2016-11-18 DIAGNOSIS — Z7901 Long term (current) use of anticoagulants: Secondary | ICD-10-CM | POA: Diagnosis not present

## 2016-11-18 DIAGNOSIS — Z7984 Long term (current) use of oral hypoglycemic drugs: Secondary | ICD-10-CM | POA: Diagnosis not present

## 2016-11-18 DIAGNOSIS — F410 Panic disorder [episodic paroxysmal anxiety] without agoraphobia: Secondary | ICD-10-CM | POA: Insufficient documentation

## 2016-11-18 DIAGNOSIS — E1165 Type 2 diabetes mellitus with hyperglycemia: Secondary | ICD-10-CM | POA: Insufficient documentation

## 2016-11-18 DIAGNOSIS — R0789 Other chest pain: Secondary | ICD-10-CM | POA: Insufficient documentation

## 2016-11-18 DIAGNOSIS — Z79899 Other long term (current) drug therapy: Secondary | ICD-10-CM | POA: Insufficient documentation

## 2016-11-18 DIAGNOSIS — F17210 Nicotine dependence, cigarettes, uncomplicated: Secondary | ICD-10-CM | POA: Diagnosis not present

## 2016-11-18 NOTE — ED Triage Notes (Signed)
Pt states he started having right to center chest pain today; pt states pain was pressure with dizziness and lightheadedness; pt hs hx of DM; pt states hx panic attacks; pt state he believes he had panic attack due to some family stressors today; pt a&ox 4 on arrival. Pt c/o pain at 2/10

## 2016-11-19 ENCOUNTER — Emergency Department (HOSPITAL_COMMUNITY)
Admission: EM | Admit: 2016-11-19 | Discharge: 2016-11-19 | Disposition: A | Discharge status: Home / Self Care | Payer: Medicare Other | Attending: Emergency Medicine | Admitting: Emergency Medicine

## 2016-11-19 ENCOUNTER — Emergency Department (HOSPITAL_COMMUNITY): Payer: Medicare Other

## 2016-11-19 DIAGNOSIS — R079 Chest pain, unspecified: Secondary | ICD-10-CM | POA: Diagnosis not present

## 2016-11-19 DIAGNOSIS — R739 Hyperglycemia, unspecified: Secondary | ICD-10-CM

## 2016-11-19 DIAGNOSIS — R0789 Other chest pain: Secondary | ICD-10-CM

## 2016-11-19 DIAGNOSIS — F410 Panic disorder [episodic paroxysmal anxiety] without agoraphobia: Secondary | ICD-10-CM

## 2016-11-19 HISTORY — DX: Panic disorder (episodic paroxysmal anxiety): F41.0

## 2016-11-19 LAB — BASIC METABOLIC PANEL
Anion gap: 10 (ref 5–15)
BUN: 5 mg/dL — ABNORMAL LOW (ref 6–20)
CHLORIDE: 102 mmol/L (ref 101–111)
CO2: 26 mmol/L (ref 22–32)
CREATININE: 0.72 mg/dL (ref 0.61–1.24)
Calcium: 9.1 mg/dL (ref 8.9–10.3)
GFR calc non Af Amer: >60 mL/min (ref >60)
Glucose, Bld: 352 mg/dL — ABNORMAL HIGH (ref 65–99)
Potassium: 4.1 mmol/L (ref 3.5–5.1)
SODIUM: 138 mmol/L (ref 135–145)

## 2016-11-19 LAB — CBC
HCT: 48.2 % (ref 39.0–52.0)
Hemoglobin: 17 g/dL (ref 13.0–17.0)
MCH: 29.2 pg (ref 26.0–34.0)
MCHC: 35.3 g/dL (ref 30.0–36.0)
MCV: 82.7 fL (ref 78.0–100.0)
PLATELETS: 139 10*3/uL — AB (ref 150–400)
RBC: 5.83 MIL/uL — AB (ref 4.22–5.81)
RDW: 12.8 % (ref 11.5–15.5)
WBC: 9.8 10*3/uL (ref 4.0–10.5)

## 2016-11-19 LAB — I-STAT TROPONIN, ED: TROPONIN I, POC: 0 ng/mL (ref 0.00–0.08)

## 2016-11-19 NOTE — ED Provider Notes (Signed)
[facility] DEPT Provider Note   CSN: [id number] Arrival date & time: 11/18/16  2313  By signing my name below, I, Arianna Nassar and Margit Banda, attest that this documentation has been prepared under the direction and in the presence of Merryl Hacker, MD.  Electronically Signed: Julien Nordmann, ED Scribe. 11/19/16. 2:44 AM.    History   Chief Complaint Chief Complaint  Patient presents with  . Chest Pain  . Panic Attack    HPI HPI Comments: Bradley [name] is a 36 y.o. male who has a PMHx of panic attack, DVT, ADHD, HTN and NIDDM presents to the Emergency Department complaining of acute onset generalized chest pain that began ~ 10:10 pm this evening (approx. 5 hours PTA). Pt says he began to have associated dizziness and bilateral upper extremity numbness. He expresses that he does not have any current chest pain. He further expresses shortness of breath at baseline due to smoking one pack per day. He has had similar symptoms in the past that relate to him having a panic attack. He states he has been under a lot of family stress for about 4 months and he has had his feelings built up inside of him. He expresses feeling more angry this evening at the time of this panic attack. Pt currently has a DVT in his left lower extremity and has been compliant with his Xarelto for the past 6 months. He is also currently on Metformin. He expresses a family hx of cardiac problems before the age of 77 but notes no one in his family has had an MI at a young age. He denies increased leg swelling, fever, and cough.   The history is provided by the patient. No language interpreter was used.    Past Medical History:  Diagnosis Date  . ADHD (attention deficit hyperactivity disorder)   . Bipolar disorder ([place])   . Diabetes mellitus without complication ([place])   . Morbid obesity ([place])   . Panic attack     Patient Active Problem List   Diagnosis Date Noted  . Chronic deep vein thrombosis  (DVT) of proximal vein of left lower extremity ([place]) 11/12/2016  . DVT (deep venous thrombosis), left 06/19/2016  . Smoking addiction 06/19/2016  . HTN (hypertension) 07/27/2013  . Morbid obesity ([place]) 07/27/2013  . Bipolar 1 disorder, mixed, moderate ([place]) 07/27/2013  . DM2 (diabetes mellitus, type 2) ([place]) 04/26/2013    Past Surgical History:  Procedure Laterality Date  . surgery on meatus as a child         Home Medications    Prior to Admission medications   Medication Sig Start Date End Date Taking? Authorizing Provider  acetaminophen (TYLENOL) 325 MG tablet Take 650 mg by mouth every 8 (eight) hours as needed. For pain.    Historical Provider, MD  albuterol (PROVENTIL HFA;VENTOLIN HFA) 108 (90 Base) MCG/ACT inhaler Inhale 1-2 puffs into the lungs every 6 (six) hours as needed for wheezing or shortness of breath. 02/23/16   Konrad Felix, PA  atomoxetine (STRATTERA) 10 MG capsule Take 10 mg by mouth at bedtime.     Historical Provider, MD  clotrimazole (LOTRIMIN) 1 % cream Apply to affected area 2 times daily 04/09/13   Drenda Freeze, MD  glucose monitoring kit (FREESTYLE) monitoring kit 1 each by Does not apply route 4 (four) times daily - after meals and at bedtime. 1 month Diabetic Testing Supplies for QAC-QHS accuchecks. 07/27/13   Thurnell Lose, MD  metformin (  FORTAMET) 500 MG (OSM) 24 hr tablet Take 2 tablets (1,000 mg total) by mouth daily with breakfast. 11/13/16   Tresa Garter, MD  metFORMIN (GLUCOPHAGE XR) 500 MG 24 hr tablet TAKE 2 TABLETS WITH BREAKFAST 11/13/16   Tresa Garter, MD  rivaroxaban (XARELTO) 20 MG TABS tablet Take 1 tablet (20 mg total) by mouth daily with supper. 11/12/16   Tresa Garter, MD  topiramate (TOPAMAX) 100 MG tablet Take 100 mg by mouth 2 (two) times daily.      Historical Provider, MD  traMADol (ULTRAM) 50 MG tablet Take 1 tablet (50 mg total) by mouth every 6 (six) hours as needed. 11/12/16   Tresa Garter, MD     Family History No family history on file.  Social History Social History  Substance Use Topics  . Smoking status: Current Every Day Smoker    Packs/day: 1.00    Types: Cigarettes  . Smokeless tobacco: Never Used  . Alcohol use Yes     Comment: Rarely     Allergies   Patient has no known allergies.   Review of Systems Review of Systems  Constitutional: Negative for fever.  Respiratory: Positive for shortness of breath and wheezing. Negative for cough.   Cardiovascular: Positive for chest pain (resolved). Negative for leg swelling.  Gastrointestinal: Negative for abdominal pain, nausea and vomiting.  Psychiatric/Behavioral: The patient is nervous/anxious.   All other systems reviewed and are negative.    Physical Exam Updated Vital Signs BP 133/87 (BP Location: Right Arm)   Pulse 99   Temp 98 F (36.7 C) (Oral)   Resp 13   SpO2 100%   Physical Exam  Constitutional: He is oriented to person, place, and time. He appears well-developed and well-nourished. No distress.  Overweight  HENT:  Head: Normocephalic and atraumatic.  Cardiovascular: Normal rate, regular rhythm and normal heart sounds.   No murmur heard. Pulmonary/Chest: Effort normal. No respiratory distress. He has wheezes.  Good air movement, diffuse expiratory wheezing  Abdominal: Soft. Bowel sounds are normal. There is no tenderness. There is no rebound.  Musculoskeletal: He exhibits edema.  1+ symmetric bilateral lower extremity edema  Lymphadenopathy:    He has no cervical adenopathy.  Neurological: He is alert and oriented to person, place, and time.  Skin: Skin is warm and dry.  Venous stasis changes bilaterally  Psychiatric: He has a normal mood and affect.  Nursing note and vitals reviewed.    ED Treatments / Results  DIAGNOSTIC STUDIES: Oxygen Saturation is 100% on RA, normal by my interpretation.  COORDINATION OF CARE: 2:55 AM Discussed treatment plan with pt at bedside and pt  agreed to plan.   Labs (all labs ordered are listed, but only abnormal results are displayed) Labs Reviewed  BASIC METABOLIC PANEL - Abnormal; Notable for the following:       Result Value   Glucose, Bld 352 (*)    BUN 5 (*)    All other components within normal limits  CBC - Abnormal; Notable for the following:    RBC 5.83 (*)    Platelets 139 (*)    All other components within normal limits  I-STAT TROPOININ, ED    EKG  EKG Interpretation  Date/Time:  Tuesday November 18 2016 23:24:55 EST Ventricular Rate:  107 PR Interval:  136 QRS Duration: 98 QT Interval:  346 QTC Calculation: 461 R Axis:   17 Text Interpretation:  Sinus tachycardia Otherwise normal ECG Confirmed by Cecilee Rosner  MD, Fannye Myer (  [id number]) on 11/19/2016 2:35:36 AM       Radiology Dg Chest 2 View  Result Date: 11/19/2016 CLINICAL DATA:  Chest pain EXAM: CHEST  2 VIEW COMPARISON:  None. FINDINGS: The lungs are well inflated. Cardiomediastinal contours are normal. No pneumothorax or pleural effusion. No focal airspace consolidation or pulmonary edema. IMPRESSION: Clear lungs. Electronically Signed   By: Ulyses Jarred M.D.   On: 11/19/2016 00:32    Procedures Procedures (including critical care time)  Medications Ordered in ED Medications - No data to display   Initial Impression / Assessment and Plan / ED Course  I have reviewed the triage vital signs and the nursing notes.  Pertinent labs & imaging results that were available during my care of the patient were reviewed by me and considered in my medical decision making (see chart for details).     Patient presents with chest pain. This was in the setting of feeling angry and anxious. Chest pain has since resolved. History of the same. EKG and troponin negative. Chest x-ray reassuring. Patient does have history of DVT but reports compliance with Xarelto. Low suspicion at this time for PE. He does have wheezing on exam but states this is his baseline. Noted  to have hyperglycemia without evidence of anion gap. States that he just had his metformin adjusted in clinic. He will continue to monitor at home. I have encouraged him to hydrate as his heart rate did increase with standing but not technically orthostatic. He denies dizziness at this time.  After history, exam, and medical workup I feel the patient has been appropriately medically screened and is safe for discharge home. Pertinent diagnoses were discussed with the patient. Patient was given return precautions.   Final Clinical Impressions(s) / ED Diagnoses   Final diagnoses:  Other chest pain  Panic attack  Hyperglycemia    New Prescriptions New Prescriptions   No medications on file   I personally performed the services described in this documentation, which was scribed in my presence. The recorded information has been reviewed and is accurate.    Merryl Hacker, MD 11/19/16 [phone]

## 2016-11-19 NOTE — ED Notes (Signed)
Pt going out to smoke

## 2016-11-19 NOTE — Discharge Instructions (Signed)
Follow-up with your primary doctor. Make sure to take your medications as prescribed. Monitor blood sugars at home.

## 2016-12-10 ENCOUNTER — Other Ambulatory Visit: Payer: Self-pay | Admitting: Internal Medicine

## 2016-12-10 DIAGNOSIS — I825Y2 Chronic embolism and thrombosis of unspecified deep veins of left proximal lower extremity: Secondary | ICD-10-CM

## 2016-12-10 MED FILL — METFORMIN HCL ER 500 MG TAB: 500 | 30 days supply | Qty: 60 | Fill #1

## 2016-12-10 NOTE — Telephone Encounter (Signed)
Pt is out of Tramadol and would like to know if he can have a refill. Please f/u.

## 2016-12-12 NOTE — Telephone Encounter (Signed)
Pt calling to f/u on request for refill of Tramadol. Please f/u with pt.

## 2016-12-12 NOTE — Telephone Encounter (Signed)
Patient request refill on tramadol.

## 2016-12-16 NOTE — Telephone Encounter (Signed)
Patient is calling again following up on request for tramadol....   Please follow up

## 2016-12-16 NOTE — Telephone Encounter (Signed)
Patient is requesting a refill on tramadol which was last filled on 11/12/16.

## 2016-12-17 ENCOUNTER — Other Ambulatory Visit: Payer: Self-pay | Admitting: Internal Medicine

## 2016-12-17 DIAGNOSIS — I825Y2 Chronic embolism and thrombosis of unspecified deep veins of left proximal lower extremity: Secondary | ICD-10-CM

## 2016-12-17 MED ORDER — TRAMADOL HCL 50 MG PO TABS: 50.0000 mg | ORAL_TABLET | Freq: Four times a day (QID) | ORAL | 0 refills | Status: DC | PRN

## 2016-12-17 MED FILL — XARELTO 20 MG TABLET: 20 | 30 days supply | Qty: 30 | Fill #1

## 2016-12-17 MED FILL — traMADol HCL 50 MG TABS: 50 | 15 days supply | Qty: 60 | Fill #0

## 2016-12-17 NOTE — Telephone Encounter (Signed)
Patient is aware of refill being approved with a signed Controlled Substance Agreement form.  Patient is aware of this being a protocol for all patients who receive more than one prescription for pain medication. Patient will review the agreement and sign and date. He will also receive a copy. Patient is also aware of PCP wanting to evaluate the patient again for the chronic need. Medical Assistant left message on patient's home and cell voicemail. Voicemail states to give [facility] [place] [facility] [place] [facility] [phone].

## 2016-12-17 NOTE — Telephone Encounter (Signed)
Refilled

## 2017-01-12 ENCOUNTER — Other Ambulatory Visit: Payer: Self-pay | Admitting: Internal Medicine

## 2017-01-12 ENCOUNTER — Telehealth: Payer: Self-pay | Admitting: Internal Medicine

## 2017-01-12 DIAGNOSIS — I825Y2 Chronic embolism and thrombosis of unspecified deep veins of left proximal lower extremity: Secondary | ICD-10-CM

## 2017-01-12 MED ORDER — TRAMADOL HCL 50 MG PO TABS: 50.0000 mg | ORAL_TABLET | Freq: Four times a day (QID) | ORAL | 0 refills | Status: DC | PRN

## 2017-01-12 MED FILL — METFORMIN HCL ER 500 MG TAB: 500 | 30 days supply | Qty: 60 | Fill #2

## 2017-01-12 NOTE — Telephone Encounter (Signed)
Patient called the office to request medication refill for traMADol (ULTRAM) 50 MG tablet. ° °Thank you.  °

## 2017-01-12 NOTE — Telephone Encounter (Signed)
Refilled

## 2017-01-14 ENCOUNTER — Other Ambulatory Visit: Payer: Self-pay | Admitting: *Deleted

## 2017-01-14 DIAGNOSIS — I825Y2 Chronic embolism and thrombosis of unspecified deep veins of left proximal lower extremity: Secondary | ICD-10-CM

## 2017-01-14 MED ORDER — TRAMADOL HCL 50 MG PO TABS: 50.0000 mg | ORAL_TABLET | Freq: Four times a day (QID) | ORAL | 0 refills | Status: DC | PRN

## 2017-01-14 MED FILL — traMADol HCL 50 MG TABS: 50 | 15 days supply | Qty: 60 | Fill #0

## 2017-01-22 MED FILL — XARELTO 20 MG TABLET: 20 | 30 days supply | Qty: 30 | Fill #2

## 2017-02-11 ENCOUNTER — Telehealth: Payer: Self-pay | Admitting: Internal Medicine

## 2017-02-11 ENCOUNTER — Other Ambulatory Visit: Payer: Self-pay | Admitting: Internal Medicine

## 2017-02-11 DIAGNOSIS — I825Y2 Chronic embolism and thrombosis of unspecified deep veins of left proximal lower extremity: Secondary | ICD-10-CM

## 2017-02-11 MED ORDER — TRAMADOL HCL 50 MG PO TABS: 50.0000 mg | ORAL_TABLET | Freq: Four times a day (QID) | ORAL | 0 refills | Status: DC | PRN

## 2017-02-11 NOTE — Telephone Encounter (Signed)
Pt is calling to request a refill of Tramadol. Pt requests a call to advise if script will be written. Please f/u with pt. Thank you.

## 2017-02-12 ENCOUNTER — Other Ambulatory Visit: Payer: Self-pay | Admitting: Internal Medicine

## 2017-02-12 MED FILL — METFORMIN HCL ER 500 MG TAB: 500 | 30 days supply | Qty: 60 | Fill #3

## 2017-02-12 NOTE — Telephone Encounter (Signed)
PT called back to check on his prescription refill

## 2017-02-12 NOTE — Telephone Encounter (Signed)
Yes. Placed at the front desk

## 2017-02-18 ENCOUNTER — Encounter: Payer: Self-pay | Admitting: Internal Medicine

## 2017-02-18 ENCOUNTER — Ambulatory Visit: Payer: Medicare Other | Attending: Internal Medicine | Admitting: Internal Medicine

## 2017-02-18 VITALS — BP 138/84 | HR 83 | Temp 98.4°F | Resp 18 | Ht 72.0 in | Wt 293.0 lb

## 2017-02-18 DIAGNOSIS — Z86718 Personal history of other venous thrombosis and embolism: Secondary | ICD-10-CM | POA: Diagnosis not present

## 2017-02-18 DIAGNOSIS — E119 Type 2 diabetes mellitus without complications: Secondary | ICD-10-CM

## 2017-02-18 DIAGNOSIS — F319 Bipolar disorder, unspecified: Secondary | ICD-10-CM | POA: Insufficient documentation

## 2017-02-18 DIAGNOSIS — Z79899 Other long term (current) drug therapy: Secondary | ICD-10-CM | POA: Insufficient documentation

## 2017-02-18 DIAGNOSIS — F410 Panic disorder [episodic paroxysmal anxiety] without agoraphobia: Secondary | ICD-10-CM | POA: Diagnosis not present

## 2017-02-18 DIAGNOSIS — Z7984 Long term (current) use of oral hypoglycemic drugs: Secondary | ICD-10-CM | POA: Insufficient documentation

## 2017-02-18 DIAGNOSIS — I83813 Varicose veins of bilateral lower extremities with pain: Secondary | ICD-10-CM | POA: Insufficient documentation

## 2017-02-18 DIAGNOSIS — I825Y2 Chronic embolism and thrombosis of unspecified deep veins of left proximal lower extremity: Secondary | ICD-10-CM | POA: Diagnosis not present

## 2017-02-18 DIAGNOSIS — I10 Essential (primary) hypertension: Secondary | ICD-10-CM | POA: Diagnosis not present

## 2017-02-18 DIAGNOSIS — Z6839 Body mass index (BMI) 39.0-39.9, adult: Secondary | ICD-10-CM | POA: Diagnosis not present

## 2017-02-18 DIAGNOSIS — F909 Attention-deficit hyperactivity disorder, unspecified type: Secondary | ICD-10-CM | POA: Insufficient documentation

## 2017-02-18 DIAGNOSIS — Z7901 Long term (current) use of anticoagulants: Secondary | ICD-10-CM | POA: Diagnosis not present

## 2017-02-18 LAB — GLUCOSE, POCT (MANUAL RESULT ENTRY): POC GLUCOSE: 281 mg/dL — AB (ref 70–99)

## 2017-02-18 LAB — POCT GLYCOSYLATED HEMOGLOBIN (HGB A1C): Hemoglobin A1C: 10.6

## 2017-02-18 MED ORDER — GLIPIZIDE 5 MG PO TABS: 5.0000 mg | ORAL_TABLET | Freq: Two times a day (BID) | ORAL | 3 refills | Status: DC

## 2017-02-18 MED ORDER — TOPIRAMATE 100 MG PO TABS: 100.0000 mg | ORAL_TABLET | Freq: Two times a day (BID) | ORAL | 3 refills | Status: DC

## 2017-02-18 MED ORDER — METFORMIN HCL ER 500 MG PO TB24: ORAL_TABLET | ORAL | 3 refills | Status: DC

## 2017-02-18 MED ORDER — GABAPENTIN 100 MG PO CAPS: 100.0000 mg | ORAL_CAPSULE | Freq: Three times a day (TID) | ORAL | 3 refills | Status: DC

## 2017-02-18 MED ORDER — RIVAROXABAN 20 MG PO TABS: 20.0000 mg | ORAL_TABLET | Freq: Every day | ORAL | 2 refills | Status: DC

## 2017-02-18 MED FILL — glipiZIDE 5 MG TABS: 5 | 30 days supply | Qty: 60 | Fill #0

## 2017-02-18 MED FILL — traMADol HCL 50 MG TABS: 50 | 15 days supply | Qty: 60 | Fill #0

## 2017-02-18 MED FILL — TOPIRAMATE 100 MG TABLET: 100 | 30 days supply | Qty: 60 | Fill #0

## 2017-02-18 MED FILL — XARELTO 20 MG TABLET: 20 | 30 days supply | Qty: 30 | Fill #0

## 2017-02-18 MED FILL — GABAPENTIN 100 MG CAPSULE: 100 | 30 days supply | Qty: 90 | Fill #0

## 2017-02-18 NOTE — Progress Notes (Signed)
Patient is here for FU DM   Patient complains of bilateral leg pain which is described as burning and tenderness.  Pain is scaled at a 7 currently.  Patient has not taken medication today. Patient has eaten today.  Patient request a refill on his inhaler.

## 2017-02-18 NOTE — Progress Notes (Signed)
 Bradley [name], is a 36 y.o. male  CSN:[id number]  MRN:[id number]  DOB - 04/24/1981  Chief Complaint  Patient presents with  . Diabetes      Subjective:   Bradley [name] is a 36 y.o. male with history of Hypertension and type 2 diabetes mellitus (A1C 12.3) , diagnosed with DVT and started on anticoagulant recently here today for a follow up visit and medication refills. Patient is complaining of pain in her legs bilaterally especially around her distended leg veins. He rates his pain at 7 out of 10. Nonradiating but feels like burning and tingling. Patient also requests refill of her medications today. Patient is working really hard to lose weight as part of her diabetes management. He is also trying to adhere to take his medications. His ADHD and bipolar disorder are stable. Patient has No headache, No chest pain, No abdominal pain - No Nausea, No new weakness tingling or numbness, No Cough - SOB.  Problem  Varicose Veins of Both Lower Extremities With Pain    ALLERGIES: No Known Allergies  PAST MEDICAL HISTORY: Past Medical History:  Diagnosis Date  . ADHD (attention deficit hyperactivity disorder)   . Bipolar disorder ([facility])   . Diabetes mellitus without complication ([facility])   . Morbid obesity ([facility])   . Panic attack     MEDICATIONS AT HOME: Prior to Admission medications   Medication Sig Start Date End Date Taking? Authorizing Provider  albuterol (PROVENTIL HFA;VENTOLIN HFA) 108 (90 Base) MCG/ACT inhaler Inhale 1-2 puffs into the lungs every 6 (six) hours as needed for wheezing or shortness of breath. 02/23/16  Yes Patrick, Frank C, PA  atomoxetine (STRATTERA) 10 MG capsule Take 10 mg by mouth at bedtime.    Yes [provider]  clotrimazole (LOTRIMIN) 1 % cream Apply to affected area 2 times daily 04/09/13  Yes Yao, David Hsienta, MD  glucose monitoring kit (FREESTYLE) monitoring kit 1 each by Does not apply route 4 (four) times daily - after meals and at bedtime. 1  month Diabetic Testing Supplies for QAC-QHS accuchecks. 07/27/13  Yes Singh, Prashant K, MD  metFORMIN (GLUCOPHAGE-XR) 500 MG 24 hr tablet TAKE 2 TABLETS WITH BREAKFAST 02/18/17  Yes ,  E, MD  rivaroxaban (XARELTO) 20 MG TABS tablet Take 1 tablet (20 mg total) by mouth daily with supper. 02/18/17  Yes ,  E, MD  topiramate (TOPAMAX) 100 MG tablet Take 1 tablet (100 mg total) by mouth 2 (two) times daily. 02/18/17  Yes ,  E, MD  traMADol (ULTRAM) 50 MG tablet Take 1 tablet (50 mg total) by mouth every 6 (six) hours as needed. 02/11/17  Yes ,  E, MD  gabapentin (NEURONTIN) 100 MG capsule Take 1 capsule (100 mg total) by mouth 3 (three) times daily. 02/18/17   ,  E, MD  glipiZIDE (GLUCOTROL) 5 MG tablet Take 1 tablet (5 mg total) by mouth 2 (two) times daily before a meal. 02/18/17   ,  E, MD    Objective:   Vitals:   02/18/17 1408  BP: 138/84  Pulse: 83  Resp: 18  Temp: 98.4 F (36.9 C)  TempSrc: Oral  SpO2: 98%  Weight: 293 lb (132.9 kg)  Height: 6' (1.829 m)   Exam General appearance : Awake, alert, not in any distress. Speech Clear. Not toxic looking, obese HEENT: Atraumatic and Normocephalic, pupils equally reactive to light and accomodation Neck: Supple, no JVD. No cervical lymphadenopathy.  Chest: Good air entry bilaterally, no added sounds    CVS: S1 S2 regular, no murmurs.  Abdomen: Bowel sounds present, Non tender and not distended with no gaurding, rigidity or rebound. Extremities: B/L Lower Ext shows no edema, ++ Varicose vein bilaterally on lower extremities, both legs are warm to touch Neurology: Awake alert, and oriented X 3, CN II-XII intact, Non focal Skin: No Rash  Data Review Lab Results  Component Value Date   HGBA1C 10.6 02/18/2017   HGBA1C 12.7 11/12/2016   HGBA1C 12.3 06/19/2016    Assessment & Plan   1. Type 2 diabetes mellitus without complication, without long-term  current use of insulin ([facility])  - POCT A1C - Glucose (CBG)  - metFORMIN (GLUCOPHAGE-XR) 500 MG 24 hr tablet; TAKE 2 TABLETS WITH BREAKFAST  Dispense: 180 tablet; Refill: 3 - glipiZIDE (GLUCOTROL) 5 MG tablet; Take 1 tablet (5 mg total) by mouth 2 (two) times daily before a meal.  Dispense: 60 tablet; Refill: 3 - gabapentin (NEURONTIN) 100 MG capsule; Take 1 capsule (100 mg total) by mouth 3 (three) times daily.  Dispense: 90 capsule; Refill: 3  2. Chronic deep vein thrombosis (DVT) of proximal vein of left lower extremity ([facility])  - rivaroxaban (XARELTO) 20 MG TABS tablet; Take 1 tablet (20 mg total) by mouth daily with supper.  Dispense: 30 tablet; Refill: 2  3. Varicose veins of both lower extremities with pain  - topiramate (TOPAMAX) 100 MG tablet; Take 1 tablet (100 mg total) by mouth 2 (two) times daily.  Dispense: 60 tablet; Refill: 3 - Ambulatory referral to Vascular Surgery  Patient have been counseled extensively about nutrition and exercise. Other issues discussed during this visit include: low cholesterol diet, weight control and daily exercise, foot care, annual eye examinations at Ophthalmology, importance of adherence with medications and regular follow-up. We also discussed long term complications of uncontrolled diabetes and hypertension.   Return in about 3 months (around 05/21/2017) for Hemoglobin A1C and Follow up, DM, Follow up HTN.  The patient was given clear instructions to go to ER or return to medical center if symptoms don't improve, worsen or new problems develop. The patient verbalized understanding. The patient was told to call to get lab results if they haven't heard anything in the next week.   This note has been created with Dragon speech recognition software and smart phrase technology. Any transcriptional errors are unintentional.    , , MD, MHA, FACP, FAAP, CPE [facility] [place], [place] [phone]     02/18/2017, 2:25 PM 

## 2017-02-18 NOTE — Patient Instructions (Signed)
Diabetes Mellitus and Food It is important for you to manage your blood sugar (glucose) level. Your blood glucose level can be greatly affected by what you eat. Eating healthier foods in the appropriate amounts throughout the day at about the same time each day will help you control your blood glucose level. It can also help slow or prevent worsening of your diabetes mellitus. Healthy eating may even help you improve the level of your blood pressure and reach or maintain a healthy weight. General recommendations for healthful eating and cooking habits include:  Eating meals and snacks regularly. Avoid going long periods of time without eating to lose weight.  Eating a diet that consists mainly of plant-based foods, such as fruits, vegetables, nuts, legumes, and whole grains.  Using low-heat cooking methods, such as baking, instead of high-heat cooking methods, such as deep frying. Work with your dietitian to make sure you understand how to use the Nutrition Facts information on food labels. How can food affect me? Carbohydrates  Carbohydrates affect your blood glucose level more than any other type of food. Your dietitian will help you determine how many carbohydrates to eat at each meal and teach you how to count carbohydrates. Counting carbohydrates is important to keep your blood glucose at a healthy level, especially if you are using insulin or taking certain medicines for diabetes mellitus. Alcohol  Alcohol can cause sudden decreases in blood glucose (hypoglycemia), especially if you use insulin or take certain medicines for diabetes mellitus. Hypoglycemia can be a life-threatening condition. Symptoms of hypoglycemia (sleepiness, dizziness, and disorientation) are similar to symptoms of having too much alcohol. If your health care provider has given you approval to drink alcohol, do so in moderation and use the following guidelines:  Women should not have more than one drink per day, and men  should not have more than two drinks per day. One drink is equal to:  12 oz of beer.  5 oz of wine.  1 oz of hard liquor.  Do not drink on an empty stomach.  Keep yourself hydrated. Have water, diet soda, or unsweetened iced tea.  Regular soda, juice, and other mixers might contain a lot of carbohydrates and should be counted. What foods are not recommended? As you make food choices, it is important to remember that all foods are not the same. Some foods have fewer nutrients per serving than other foods, even though they might have the same number of calories or carbohydrates. It is difficult to get your body what it needs when you eat foods with fewer nutrients. Examples of foods that you should avoid that are high in calories and carbohydrates but low in nutrients include:  Trans fats (most processed foods list trans fats on the Nutrition Facts label).  Regular soda.  Juice.  Candy.  Sweets, such as cake, pie, doughnuts, and cookies.  Fried foods. What foods can I eat? Eat nutrient-rich foods, which will nourish your body and keep you healthy. The food you should eat also will depend on several factors, including:  The calories you need.  The medicines you take.  Your weight.  Your blood glucose level.  Your blood pressure level.  Your cholesterol level. You should eat a variety of foods, including:  Protein.  Lean cuts of meat.  Proteins low in saturated fats, such as fish, egg whites, and beans. Avoid processed meats.  Fruits and vegetables.  Fruits and vegetables that may help control blood glucose levels, such as apples, mangoes, and   yams.  Dairy products.  Choose fat-free or low-fat dairy products, such as milk, yogurt, and cheese.  Grains, bread, pasta, and rice.  Choose whole grain products, such as multigrain bread, whole oats, and brown rice. These foods may help control blood pressure.  Fats.  Foods containing healthful fats, such as nuts,  avocado, olive oil, canola oil, and fish. Does everyone with diabetes mellitus have the same meal plan? Because every person with diabetes mellitus is different, there is not one meal plan that works for everyone. It is very important that you meet with a dietitian who will help you create a meal plan that is just right for you. This information is not intended to replace advice given to you by your health care provider. Make sure you discuss any questions you have with your health care provider. Document Released: 06/12/2005 Document Revised: 02/21/2016 Document Reviewed: 08/12/2013 Elsevier Interactive Patient Education  2017 [employer] Diabetes Mellitus and Exercise Exercising regularly is important for your overall health, especially when you have diabetes (diabetes mellitus). Exercising is not only about losing weight. It has many health benefits, such as increasing muscle strength and bone density and reducing body fat and stress. This leads to improved fitness, flexibility, and endurance, all of which result in better overall health. Exercise has additional benefits for people with diabetes, including:  Reducing appetite.  Helping to lower and control blood glucose.  Lowering blood pressure.  Helping to control amounts of fatty substances (lipids) in the blood, such as cholesterol and triglycerides.  Helping the body to respond better to insulin (improving insulin sensitivity).  Reducing how much insulin the body needs.  Decreasing the risk for heart disease by:  Lowering cholesterol and triglyceride levels.  Increasing the levels of good cholesterol.  Lowering blood glucose levels. What is my activity plan? Your health care provider or certified diabetes educator can help you make a plan for the type and frequency of exercise (activity plan) that works for you. Make sure that you:  Do at least 150 minutes of moderate-intensity or vigorous-intensity exercise each week. This  could be brisk walking, biking, or water aerobics.  Do stretching and strength exercises, such as yoga or weightlifting, at least 2 times a week.  Spread out your activity over at least 3 days of the week.  Get some form of physical activity every day.  Do not go more than 2 days in a row without some kind of physical activity.  Avoid being inactive for more than 90 minutes at a time. Take frequent breaks to walk or stretch.  Choose a type of exercise or activity that you enjoy, and set realistic goals.  Start slowly, and gradually increase the intensity of your exercise over time. What do I need to know about managing my diabetes?  Check your blood glucose before and after exercising.  If your blood glucose is higher than 240 mg/dL (13.3 mmol/L) before you exercise, check your urine for ketones. If you have ketones in your urine, do not exercise until your blood glucose returns to normal.  Know the symptoms of low blood glucose (hypoglycemia) and how to treat it. Your risk for hypoglycemia increases during and after exercise. Common symptoms of hypoglycemia can include:  Hunger.  Anxiety.  Sweating and feeling clammy.  Confusion.  Dizziness or feeling light-headed.  Increased heart rate or palpitations.  Blurry vision.  Tingling or numbness around the mouth, lips, or tongue.  Tremors or shakes.  Irritability.  Keep a rapid-acting   carbohydrate snack available before, during, and after exercise to help prevent or treat hypoglycemia.  Avoid injecting insulin into areas of the body that are going to be exercised. For example, avoid injecting insulin into:  The arms, when playing tennis.  The legs, when jogging.  Keep records of your exercise habits. Doing this can help you and your health care provider adjust your diabetes management plan as needed. Write down:  Food that you eat before and after you exercise.  Blood glucose levels before and after you  exercise.  The type and amount of exercise you have done.  When your insulin is expected to peak, if you use insulin. Avoid exercising at times when your insulin is peaking.  When you start a new exercise or activity, work with your health care provider to make sure the activity is safe for you, and to adjust your insulin, medicines, or food intake as needed.  Drink plenty of water while you exercise to prevent dehydration or heat stroke. Drink enough fluid to keep your urine clear or pale yellow. This information is not intended to replace advice given to you by your health care provider. Make sure you discuss any questions you have with your health care provider. Document Released: 12/06/2003 Document Revised: 04/04/2016 Document Reviewed: 02/25/2016 Elsevier Interactive Patient Education  2017 [employer] Blood Glucose Monitoring, Adult Monitoring your blood sugar (glucose) helps you manage your diabetes. It also helps you and your health care provider determine how well your diabetes management plan is working. Blood glucose monitoring involves checking your blood glucose as often as directed, and keeping a record (log) of your results over time. Why should I monitor my blood glucose? Checking your blood glucose regularly can:  Help you understand how food, exercise, illnesses, and medicines affect your blood glucose.  Let you know what your blood glucose is at any time. You can quickly tell if you are having low blood glucose (hypoglycemia) or high blood glucose (hyperglycemia).  Help you and your health care provider adjust your medicines as needed. When should I check my blood glucose? Follow instructions from your health care provider about how often to check your blood glucose. This may depend on:  The type of diabetes you have.  How well-controlled your diabetes is.  Medicines you are taking. If you have type 1 diabetes:   Check your blood glucose at least 2 times a  day.  Also check your blood glucose:  Before every insulin injection.  Before and after exercise.  Between meals.  2 hours after a meal.  Occasionally between 2:00 a.m. and 3:00 a.m., as directed.  Before potentially dangerous tasks, like driving or using heavy machinery.  At bedtime.  You may need to check your blood glucose more often, up to 6-10 times a day:  If you use an insulin pump.  If you need multiple daily injections (MDI).  If your diabetes is not well-controlled.  If you are ill.  If you have a history of severe hypoglycemia.  If you have a history of not knowing when your blood glucose is getting low (hypoglycemia unawareness). If you have type 2 diabetes:   If you take insulin or other diabetes medicines, check your blood glucose at least 2 times a day.  If you are on intensive insulin therapy, check your blood glucose at least 4 times a day. Occasionally, you may also need to check between 2:00 a.m. and 3:00 a.m., as directed.  Also check your blood glucose:    Before and after exercise.  Before potentially dangerous tasks, like driving or using heavy machinery.  You may need to check your blood glucose more often if:  Your medicine is being adjusted.  Your diabetes is not well-controlled.  You are ill. What is a blood glucose log?  A blood glucose log is a record of your blood glucose readings. It helps you and your health care provider:  Look for patterns in your blood glucose over time.  Adjust your diabetes management plan as needed.  Every time you check your blood glucose, write down your result and notes about things that may be affecting your blood glucose, such as your diet and exercise for the day.  Most glucose meters store a record of glucose readings in the meter. Some meters allow you to download your records to a computer. How do I check my blood glucose? Follow these steps to get accurate readings of your blood  glucose: Supplies needed    Blood glucose meter.  Test strips for your meter. Each meter has its own strips. You must use the strips that come with your meter.  A needle to prick your finger (lancet). Do not use lancets more than once.  A device that holds the lancet (lancing device).  A journal or log book to write down your results. Procedure   Wash your hands with soap and water.  Prick the side of your finger (not the tip) with the lancet. Use a different finger each time.  Gently rub the finger until a small drop of blood appears.  Follow instructions that come with your meter for inserting the test strip, applying blood to the strip, and using your blood glucose meter.  Write down your result and any notes. Alternative testing sites   Some meters allow you to use areas of your body other than your finger (alternative sites) to test your blood.  If you think you may have hypoglycemia, or if you have hypoglycemia unawareness, do not use alternative sites. Use your finger instead.  Alternative sites may not be as accurate as the fingers, because blood flow is slower in these areas. This means that the result you get may be delayed, and it may be different from the result that you would get from your finger.  The most common alternative sites are:  Forearm.  Thigh.  Palm of the hand. Additional tips   Always keep your supplies with you.  If you have questions or need help, all blood glucose meters have a 24-hour "hotline" number that you can call. You may also contact your health care provider.  After you use a few boxes of test strips, adjust (calibrate) your blood glucose meter by following instructions that came with your meter. This information is not intended to replace advice given to you by your health care provider. Make sure you discuss any questions you have with your health care provider. Document Released: 09/18/2003 Document Revised: 04/04/2016 Document  Reviewed: 02/25/2016 Elsevier Interactive Patient Education  2017 [employer]  

## 2017-02-20 ENCOUNTER — Telehealth: Payer: Self-pay | Admitting: Internal Medicine

## 2017-02-20 NOTE — Telephone Encounter (Signed)
Patient called the office asking to speak with nurse or provider in regards to the side effects that he is experiencing since taking gabapentin (NEURONTIN) 100 MG capsule. Pt stated that he has experienced mood swings (from being happy to sad to paranoid). Pt doesn't like it and is concerned. Please follow up.  Thank you.

## 2017-02-24 NOTE — Telephone Encounter (Signed)
Please advise on this concern regarding mood swings with gabapentin.

## 2017-02-24 NOTE — Telephone Encounter (Signed)
Please inform patient to hold the medication for few days and observe the difference. Call back if symptoms persist or resolved

## 2017-02-25 ENCOUNTER — Other Ambulatory Visit: Payer: Self-pay | Admitting: Vascular Surgery

## 2017-02-25 DIAGNOSIS — I83813 Varicose veins of bilateral lower extremities with pain: Secondary | ICD-10-CM

## 2017-02-25 NOTE — Telephone Encounter (Signed)
Patient verified DOB Patient states he feels normal since stopping the medication on Thursday.

## 2017-02-25 NOTE — Telephone Encounter (Signed)
Patient took medication Wednesday and Thursday and states he cried for 30 minutes and then became paranoid, shortly after he became very "getty" to the point of mania. Patient states even with his bipolar he could tell something was different and "he didn't feel comfortable in his own skin". Please advise. Patient states he has not taken since Thursday and has since leveled back out.

## 2017-02-25 NOTE — Telephone Encounter (Signed)
Please schedule an appointment with Dr. Hyman HopesJEGEDE to discuss the medication concern.

## 2017-02-25 NOTE — Telephone Encounter (Signed)
Stop the medication all together. Schedule appointment to discuss

## 2017-03-04 ENCOUNTER — Encounter: Payer: Self-pay | Admitting: Internal Medicine

## 2017-03-04 ENCOUNTER — Ambulatory Visit: Payer: Medicare Other | Attending: Internal Medicine | Admitting: Internal Medicine

## 2017-03-04 VITALS — BP 95/56 | HR 100 | Temp 97.4°F | Resp 18 | Ht 72.0 in | Wt 297.0 lb

## 2017-03-04 DIAGNOSIS — E119 Type 2 diabetes mellitus without complications: Secondary | ICD-10-CM | POA: Diagnosis not present

## 2017-03-04 DIAGNOSIS — Z7984 Long term (current) use of oral hypoglycemic drugs: Secondary | ICD-10-CM | POA: Insufficient documentation

## 2017-03-04 DIAGNOSIS — F3162 Bipolar disorder, current episode mixed, moderate: Secondary | ICD-10-CM

## 2017-03-04 DIAGNOSIS — Z7901 Long term (current) use of anticoagulants: Secondary | ICD-10-CM | POA: Insufficient documentation

## 2017-03-04 DIAGNOSIS — I82409 Acute embolism and thrombosis of unspecified deep veins of unspecified lower extremity: Secondary | ICD-10-CM | POA: Diagnosis not present

## 2017-03-04 DIAGNOSIS — I10 Essential (primary) hypertension: Secondary | ICD-10-CM | POA: Diagnosis not present

## 2017-03-04 DIAGNOSIS — Z79899 Other long term (current) drug therapy: Secondary | ICD-10-CM | POA: Diagnosis not present

## 2017-03-04 LAB — GLUCOSE, POCT (MANUAL RESULT ENTRY): POC Glucose: 204 mg/dl — AB (ref 70–99)

## 2017-03-04 MED ORDER — ACETAMINOPHEN-CODEINE #3 300-30 MG PO TABS: 1.0000 | ORAL_TABLET | ORAL | 0 refills | Status: DC | PRN

## 2017-03-04 MED FILL — ACETAMINOPHEN/COD #3 TABLET: 300-30 | 10 days supply | Qty: 60 | Fill #0

## 2017-03-04 NOTE — Patient Instructions (Signed)
Blood Glucose Monitoring, Adult Monitoring your blood sugar (glucose) helps you manage your diabetes. It also helps you and your health care provider determine how well your diabetes management plan is working. Blood glucose monitoring involves checking your blood glucose as often as directed, and keeping a record (log) of your results over time. Why should I monitor my blood glucose? Checking your blood glucose regularly can:  Help you understand how food, exercise, illnesses, and medicines affect your blood glucose.  Let you know what your blood glucose is at any time. You can quickly tell if you are having low blood glucose (hypoglycemia) or high blood glucose (hyperglycemia).  Help you and your health care provider adjust your medicines as needed.  When should I check my blood glucose? Follow instructions from your health care provider about how often to check your blood glucose. This may depend on:  The type of diabetes you have.  How well-controlled your diabetes is.  Medicines you are taking.  If you have type 1 diabetes:  Check your blood glucose at least 2 times a day.  Also check your blood glucose: ? Before every insulin injection. ? Before and after exercise. ? Between meals. ? 2 hours after a meal. ? Occasionally between 2:00 a.m. and 3:00 a.m., as directed. ? Before potentially dangerous tasks, like driving or using heavy machinery. ? At bedtime.  You may need to check your blood glucose more often, up to 6-10 times a day: ? If you use an insulin pump. ? If you need multiple daily injections (MDI). ? If your diabetes is not well-controlled. ? If you are ill. ? If you have a history of severe hypoglycemia. ? If you have a history of not knowing when your blood glucose is getting low (hypoglycemia unawareness). If you have type 2 diabetes:  If you take insulin or other diabetes medicines, check your blood glucose at least 2 times a day.  If you are on intensive  insulin therapy, check your blood glucose at least 4 times a day. Occasionally, you may also need to check between 2:00 a.m. and 3:00 a.m., as directed.  Also check your blood glucose: ? Before and after exercise. ? Before potentially dangerous tasks, like driving or using heavy machinery.  You may need to check your blood glucose more often if: ? Your medicine is being adjusted. ? Your diabetes is not well-controlled. ? You are ill. What is a blood glucose log?  A blood glucose log is a record of your blood glucose readings. It helps you and your health care provider: ? Look for patterns in your blood glucose over time. ? Adjust your diabetes management plan as needed.  Every time you check your blood glucose, write down your result and notes about things that may be affecting your blood glucose, such as your diet and exercise for the day.  Most glucose meters store a record of glucose readings in the meter. Some meters allow you to download your records to a computer. How do I check my blood glucose? Follow these steps to get accurate readings of your blood glucose: Supplies needed   Blood glucose meter.  Test strips for your meter. Each meter has its own strips. You must use the strips that come with your meter.  A needle to prick your finger (lancet). Do not use lancets more than once.  A device that holds the lancet (lancing device).  A journal or log book to write down your results. Procedure  Wash your hands with soap and water.  Prick the side of your finger (not the tip) with the lancet. Use a different finger each time.  Gently rub the finger until a small drop of blood appears.  Follow instructions that come with your meter for inserting the test strip, applying blood to the strip, and using your blood glucose meter.  Write down your result and any notes. Alternative testing sites  Some meters allow you to use areas of your body other than your finger  (alternative sites) to test your blood.  If you think you may have hypoglycemia, or if you have hypoglycemia unawareness, do not use alternative sites. Use your finger instead.  Alternative sites may not be as accurate as the fingers, because blood flow is slower in these areas. This means that the result you get may be delayed, and it may be different from the result that you would get from your finger.  The most common alternative sites are: ? Forearm. ? Thigh. ? Palm of the hand. Additional tips  Always keep your supplies with you.  If you have questions or need help, all blood glucose meters have a 24-hour "hotline" number that you can call. You may also contact your health care provider.  After you use a few boxes of test strips, adjust (calibrate) your blood glucose meter by following instructions that came with your meter. This information is not intended to replace advice given to you by your health care provider. Make sure you discuss any questions you have with your health care provider. Document Released: 09/18/2003 Document Revised: 04/04/2016 Document Reviewed: 02/25/2016 Elsevier Interactive Patient Education  2017 Elsevier Inc. Bipolar 1 Disorder Bipolar 1 disorder is a mental health disorder in which a person has episodes of emotional highs (mania), and may also have episodes of emotional lows (depression) in addition to highs. Bipolar 1 disorder is different from other bipolar disorders because it involves extreme manic episodes. These episodes last at least one week or involve symptoms that are so severe that hospitalization is needed to keep the person safe. What increases the risk? The cause of this condition is not known. However, certain factors make you more likely to have bipolar disorder, such as:  Having a family member with the disorder.  An imbalance of certain chemicals in the brain (neurotransmitters).  Stress, such as illness, financial problems, or a  death.  Certain conditions that affect the brain or spinal cord (neurologic conditions).  Brain injury (trauma).  Having another mental health disorder, such as: ? Obsessive compulsive disorder. ? Schizophrenia.  What are the signs or symptoms? Symptoms of mania include:  Very high self-esteem or self-confidence.  Decreased need for sleep.  Unusual talkativeness or feeling a need to keep talking. Speech may be very fast. It may seem like you cannot stop talking.  Racing thoughts or constant talking, with quick shifts between topics that may or may not be related (flight of ideas).  Decreased ability to focus or concentrate.  Increased purposeful activity, such as work, studies, or social activity.  Increased nonproductive activity. This could be pacing, squirming and fidgeting, or finger and toe tapping.  Impulsive behavior and poor judgment. This may result in high-risk activities, such as having unprotected sex or spending a lot of money.  Symptoms of depression include:  Feeling sad, hopeless, or helpless.  Frequent or uncontrollable crying.  Lack of feeling or caring about anything.  Sleeping too much.  Moving more slowly than usual.  Not being able to enjoy things you used to enjoy.  Wanting to be alone all the time.  Feeling guilty or worthless.  Lack of energy or motivation.  Trouble concentrating or remembering.  Trouble making decisions.  Increased appetite.  Thoughts of death, or the desire to harm yourself.  Sometimes, you may have a mixed mood. This means having symptoms of depression and mania. Stress can make symptoms worse. How is this diagnosed? To diagnose bipolar disorder, your health care provider may ask about your:  Emotional episodes.  Medical history.  Alcohol and drug use. This includes prescription medicines. Certain medical conditions and substances can cause symptoms that seem like bipolar disorder (secondary bipolar  disorder).  How is this treated? Bipolar disorder is a long-term (chronic) illness. It is best controlled with ongoing (continuous) treatment rather than treatment only when symptoms occur. Treatment may include:  Medicine. Medicine can be prescribed by a provider who specializes in treating mental disorders (psychiatrist). ? Medicines called mood stabilizers are usually prescribed. ? If symptoms occur even while taking a mood stabilizer, other medicines may be added.  Psychotherapy. Some forms of talk therapy, such as cognitive-behavioral therapy (CBT), can provide support, education, and guidance.  Coping methods, such as journaling or relaxation exercises. These may include: ? Yoga. ? Meditation. ? Deep breathing.  Lifestyle changes, such as: ? Limiting alcohol and drug use. ? Exercising regularly. ? Getting plenty of sleep. ? Making healthy eating choices.  A combination of medicine, talk therapy, and coping methods is best. A procedure in which electricity is applied to the brain through the scalp (electroconvulsive therapy) may be used in cases of severe mania when medicine and psychotherapy work too slowly or do not work. Follow these instructions at home: Activity   Return to your normal activities as told by your health care provider.  Find activities that you enjoy, and make time to do them.  Exercise regularly as told by your health care provider. Lifestyle  Limit alcohol intake to no more than 1 drink a day for nonpregnant women and 2 drinks a day for men. One drink equals 12 oz of beer, 5 oz of wine, or 1 oz of hard liquor.  Follow a set schedule for eating and sleeping.  Eat a balanced diet that includes fresh fruits and vegetables, whole grains, low-fat dairy, and lean meat.  Get 7-8 hours of sleep each night. General instructions  Take over-the-counter and prescription medicines only as told by your health care provider.  Think about joining a support  group. Your health care provider may be able to recommend a support group.  Talk with your family and loved ones about your treatment goals and how they can help.  Keep all follow-up visits as told by your health care provider. This is important. Where to find more information: For more information about bipolar disorder, visit the following websites:  [employer] on Mental Illness: www.nami.org  [employer] of Mental Health: http://www.maynard.net/  Contact a health care provider if:  Your symptoms get worse.  You have side effects from your medicine, and they get worse.  You have trouble sleeping.  You have trouble doing daily activities.  You feel unsafe in your surroundings.  You are dealing with substance abuse. Get help right away if:  You have new symptoms.  You have thoughts about harming yourself.  You self-harm. This information is not intended to replace advice given to you by your health care provider. Make sure you  discuss any questions you have with your health care provider. Document Released: 12/22/2000 Document Revised: 05/11/2016 Document Reviewed: 05/15/2016 Elsevier Interactive Patient Education  [employer]2018 Elsevier Inc.

## 2017-03-04 NOTE — Progress Notes (Signed)
Bradley [name], is a 36 y.o. male  [id number]  [id number]  DOB - 1980/11/01  Chief Complaint  Patient presents with  . Medication Problem       Subjective:   Bradley [name] is a 36 y.o. male with history of Hypertension and type 2 diabetes mellitus (A1C 12.3) , diagnosed with DVT and started on anticoagulant here today for a sick visit. He said when he took Gabapentin, he cried for 30 min, felt depressed and gloomy, but 1 hour later, euphoric and extremely happy and elated. He began to get so worried that "what's going on?". He denies any suicidal ideation or thought. Wife claimed he goes in cycle of euphoria and extreme depression, no balance. Patient has No headache, No chest pain, No abdominal pain - No Nausea, No new weakness tingling or numbness, No Cough - SOB.  Problem  Bipolar Disorder, Current Episode Mixed, Moderate (Hcc)    ALLERGIES: No Known Allergies  PAST MEDICAL HISTORY: Past Medical History:  Diagnosis Date  . ADHD (attention deficit hyperactivity disorder)   . Bipolar disorder ([place])   . Diabetes mellitus without complication ([place])   . Morbid obesity ([place])   . Panic attack     MEDICATIONS AT HOME: Prior to Admission medications   Medication Sig Start Date End Date Taking? Authorizing Provider  acetaminophen-codeine (TYLENOL #3) 300-30 MG tablet Take 1 tablet by mouth every 4 (four) hours as needed. 03/04/17   Tresa Garter, MD  albuterol (PROVENTIL HFA;VENTOLIN HFA) 108 (90 Base) MCG/ACT inhaler Inhale 1-2 puffs into the lungs every 6 (six) hours as needed for wheezing or shortness of breath. 02/23/16   Konrad Felix, PA  atomoxetine (STRATTERA) 10 MG capsule Take 10 mg by mouth at bedtime.     [provider]  clotrimazole (LOTRIMIN) 1 % cream Apply to affected area 2 times daily 04/09/13   Drenda Freeze, MD  gabapentin (NEURONTIN) 100 MG capsule Take 1 capsule (100 mg total) by mouth 3 (three) times daily. 02/18/17   Tresa Garter, MD  glipiZIDE (GLUCOTROL) 5 MG tablet Take 1 tablet (5 mg total) by mouth 2 (two) times daily before a meal. 02/18/17   Yaron Grasse E, MD  glucose monitoring kit (FREESTYLE) monitoring kit 1 each by Does not apply route 4 (four) times daily - after meals and at bedtime. 1 month Diabetic Testing Supplies for QAC-QHS accuchecks. 07/27/13   Thurnell Lose, MD  metFORMIN (GLUCOPHAGE-XR) 500 MG 24 hr tablet TAKE 2 TABLETS WITH BREAKFAST 02/18/17   Tresa Garter, MD  rivaroxaban (XARELTO) 20 MG TABS tablet Take 1 tablet (20 mg total) by mouth daily with supper. 02/18/17   Tresa Garter, MD  topiramate (TOPAMAX) 100 MG tablet Take 1 tablet (100 mg total) by mouth 2 (two) times daily. 02/18/17   Tresa Garter, MD  traMADol (ULTRAM) 50 MG tablet Take 1 tablet (50 mg total) by mouth every 6 (six) hours as needed. 02/11/17   Tresa Garter, MD    Objective:   Vitals:   03/04/17 1108  BP: (!) 95/56  Pulse: 100  Resp: 18  Temp: 97.4 F (36.3 C)  TempSrc: Oral  SpO2: 100%  Weight: 297 lb (134.7 kg)  Height: 6' (1.829 m)   Exam General appearance : Awake, alert, not in any distress. Speech Clear. Not toxic looking, obese, unkempt HEENT: Atraumatic and Normocephalic, pupils equally reactive to light and accomodation Neck: Supple, no JVD. No cervical lymphadenopathy.  Chest:  Good air entry bilaterally, no added sounds  CVS: S1 S2 regular, no murmurs.  Abdomen: Bowel sounds present, Non tender and not distended with no gaurding, rigidity or rebound. Extremities: B/L Lower Ext shows no edema, both legs are warm to touch Neurology: Awake alert, and oriented X 3, CN II-XII intact, Non focal Skin: No Rash  Data Review Lab Results  Component Value Date   HGBA1C 10.6 02/18/2017   HGBA1C 12.7 11/12/2016   HGBA1C 12.3 06/19/2016    Assessment & Plan   1. Type 2 diabetes mellitus without complication, without long-term current use of insulin (HCC)  -  Glucose (CBG)  - acetaminophen-codeine (TYLENOL #3) 300-30 MG tablet; Take 1 tablet by mouth every 4 (four) hours as needed.  Dispense: 60 tablet; Refill: 0  2. Bipolar disorder, current episode mixed, moderate ([place])  - Urgent Ambulatory referral to Psychiatry, appointment scheduled  Patient have been counseled extensively about nutrition and exercise. Other issues discussed during this visit include: low cholesterol diet, weight control and daily exercise, foot care, annual eye examinations at Ophthalmology, importance of adherence with medications and regular follow-up. We also discussed long term complications of uncontrolled diabetes and hypertension.   Return in about 3 months (around 06/04/2017) for Hemoglobin A1C and Follow up, DM.  The patient was given clear instructions to go to ER or return to medical center if symptoms don't improve, worsen or new problems develop. The patient verbalized understanding. The patient was told to call to get lab results if they haven't heard anything in the next week.   This note has been created with Surveyor, quantity. Any transcriptional errors are unintentional.    Angelica Chessman, MD, [place], Karilyn Cota, [place] and [facility] [place], [place]   03/04/2017, 11:21 AM

## 2017-03-16 ENCOUNTER — Other Ambulatory Visit: Payer: Self-pay | Admitting: Internal Medicine

## 2017-03-16 ENCOUNTER — Telehealth: Payer: Self-pay | Admitting: Internal Medicine

## 2017-03-16 DIAGNOSIS — I825Y2 Chronic embolism and thrombosis of unspecified deep veins of left proximal lower extremity: Secondary | ICD-10-CM

## 2017-03-16 MED ORDER — TRAMADOL HCL 50 MG PO TABS: 50.0000 mg | ORAL_TABLET | Freq: Four times a day (QID) | ORAL | 0 refills | Status: DC | PRN

## 2017-03-16 NOTE — Telephone Encounter (Signed)
Refilled

## 2017-03-16 NOTE — Telephone Encounter (Signed)
PT called to request a refill for traMADol (ULTRAM) 50 MG tablet   please follow up

## 2017-03-18 MED FILL — traMADol HCL 50 MG TABS: 50 | 15 days supply | Qty: 60 | Fill #0

## 2017-04-02 ENCOUNTER — Telehealth: Payer: Self-pay | Admitting: Internal Medicine

## 2017-04-02 ENCOUNTER — Other Ambulatory Visit: Payer: Self-pay | Admitting: Internal Medicine

## 2017-04-02 NOTE — Telephone Encounter (Signed)
Pt. Called requesting a refill on Tylenol # 3. Please f/u with pt.  °

## 2017-04-06 ENCOUNTER — Other Ambulatory Visit: Payer: Self-pay | Admitting: Internal Medicine

## 2017-04-06 MED ORDER — ACETAMINOPHEN-CODEINE #3 300-30 MG PO TABS: 1.0000 | ORAL_TABLET | ORAL | 0 refills | Status: DC | PRN

## 2017-04-08 ENCOUNTER — Encounter: Payer: Self-pay | Admitting: Vascular Surgery

## 2017-04-08 MED FILL — ACETAMINOPHEN/COD #3 TABLET: 300-30 | 15 days supply | Qty: 60 | Fill #0

## 2017-04-08 MED FILL — XARELTO 20 MG TABLET: 20 | 30 days supply | Qty: 30 | Fill #1

## 2017-04-15 ENCOUNTER — Telehealth: Payer: Self-pay | Admitting: Internal Medicine

## 2017-04-15 NOTE — Telephone Encounter (Signed)
Patient requesting prescription refill for Tramadol... Pt. Was advised it was discontinued.  Please call and advised

## 2017-04-16 ENCOUNTER — Ambulatory Visit (HOSPITAL_COMMUNITY)
Admission: RE | Admit: 2017-04-16 | Discharge: 2017-04-16 | Disposition: A | Discharge status: Home / Self Care | Payer: Medicare Other | Source: Ambulatory Visit | Attending: Vascular Surgery | Admitting: Vascular Surgery

## 2017-04-16 ENCOUNTER — Encounter: Payer: Self-pay | Admitting: Vascular Surgery

## 2017-04-16 ENCOUNTER — Ambulatory Visit (INDEPENDENT_AMBULATORY_CARE_PROVIDER_SITE_OTHER): Payer: Medicare Other | Admitting: Vascular Surgery

## 2017-04-16 VITALS — BP 124/82 | HR 101 | Temp 97.5°F | Resp 18 | Ht 72.0 in | Wt 293.0 lb

## 2017-04-16 DIAGNOSIS — I83813 Varicose veins of bilateral lower extremities with pain: Secondary | ICD-10-CM | POA: Diagnosis not present

## 2017-04-16 NOTE — Progress Notes (Signed)
Referring Physician: Dr Doreene Burke  Patient name: Bradley [name] MRN: [id number] DOB: [birth year] Sex: male  REASON FOR CONSULT: Symptomatic varicose veins with pain  HPI: [name] is a 36 y.o. male with history of varicosities especially in his right leg for greater than a year. He did have a left leg DVT in June 2017. He is currently on Xarelto for this. He has now been treated for 13 months. He describes pain aching and burning in his lower extremities right leg is worse in the left. He has worn some compression stockings in the past but states that he lost them. He has not worn them in several months. His family history is remarkable for his mother who had a DVT in her 48s. He has never had any open wounds or skin ulcerations. He currently smokes about a pack of cigarettes per day. He has never had a pulmonary embolus. He has never had any prior DVTs. Other medical problems include ADHD and bipolar disorder for which she is on disability. He also has diabetes which is currently controlled.  Past Medical History:  Diagnosis Date  . ADHD (attention deficit hyperactivity disorder)   . Bipolar disorder ([place])   . Diabetes mellitus without complication ([place])   . Morbid obesity ([place])   . Panic attack   . Varicose veins of both lower extremities with pain    Past Surgical History:  Procedure Laterality Date  . surgery on meatus as a child      No family history on file.  SOCIAL HISTORY: Social History   Social History  . Marital status: Single    Spouse name: N/A  . Number of children: N/A  . Years of education: N/A   Occupational History  . Not on file.   Social History Main Topics  . Smoking status: Current Every Day Smoker    Packs/day: 1.00    Types: Cigarettes  . Smokeless tobacco: Never Used  . Alcohol use Yes     Comment: Rarely  . Drug use: No  . Sexual activity: Not on file   Other Topics Concern  . Not on file   Social History Narrative  . No  narrative on file    No Known Allergies  Current Outpatient Prescriptions  Medication Sig Dispense Refill  . acetaminophen-codeine (TYLENOL #3) 300-30 MG tablet Take 1 tablet by mouth every 4 (four) hours as needed. 60 tablet 0  . atomoxetine (STRATTERA) 10 MG capsule Take 10 mg by mouth at bedtime.     . gabapentin (NEURONTIN) 100 MG capsule Take 1 capsule (100 mg total) by mouth 3 (three) times daily. 90 capsule 3  . glipiZIDE (GLUCOTROL) 5 MG tablet Take 1 tablet (5 mg total) by mouth 2 (two) times daily before a meal. 60 tablet 3  . glucose monitoring kit (FREESTYLE) monitoring kit 1 each by Does not apply route 4 (four) times daily - after meals and at bedtime. 1 month Diabetic Testing Supplies for QAC-QHS accuchecks. 1 each 1  . metFORMIN (GLUCOPHAGE-XR) 500 MG 24 hr tablet TAKE 2 TABLETS WITH BREAKFAST 180 tablet 3  . rivaroxaban (XARELTO) 20 MG TABS tablet Take 1 tablet (20 mg total) by mouth daily with supper. 30 tablet 2  . topiramate (TOPAMAX) 100 MG tablet Take 1 tablet (100 mg total) by mouth 2 (two) times daily. 60 tablet 3  . albuterol (PROVENTIL HFA;VENTOLIN HFA) 108 (90 Base) MCG/ACT inhaler Inhale 1-2 puffs into the lungs every 6 (six) hours  as needed for wheezing or shortness of breath. (Patient not taking: Reported on 04/16/2017) 1 Inhaler 0  . traMADol (ULTRAM) 50 MG tablet Take 50 mg by mouth every 6 (six) hours as needed.  0   No current facility-administered medications for this visit.     ROS:   General:  No weight loss, Fever, chills  HEENT: No recent headaches, no nasal bleeding, no visual changes, no sore throat  Neurologic: No dizziness, blackouts, seizures. No recent symptoms of stroke or mini- stroke. No recent episodes of slurred speech, or temporary blindness.  Cardiac: No recent episodes of chest pain/pressure, no shortness of breath at rest.  No shortness of breath with exertion.  Denies history of atrial fibrillation or irregular heartbeat  Vascular:  No history of rest pain in feet.  No history of claudication.  No history of non-healing ulcer, + history of DVT   Pulmonary: No home oxygen, no productive cough, no hemoptysis,  No asthma or wheezing  Musculoskeletal:  '[ ]'$  Arthritis, '[ ]'$  Low back pain,  '[ ]'$  Joint pain  Hematologic:No history of hypercoagulable state.  No history of easy bleeding.  No history of anemia  Gastrointestinal: No hematochezia or melena,  No gastroesophageal reflux, no trouble swallowing  Urinary: '[ ]'$  chronic Kidney disease, '[ ]'$  on HD - '[ ]'$  MWF or '[ ]'$  TTHS, '[ ]'$  Burning with urination, '[ ]'$  Frequent urination, '[ ]'$  Difficulty urinating;   Skin: No rashes  Psychological: No history of anxiety,  No history of depression   Physical Examination  Vitals:   04/16/17 1509  BP: 124/82  Pulse: (!) 101  Resp: 18  Temp: (!) 97.5 F (36.4 C)  SpO2: 98%  Weight: 293 lb (132.9 kg)  Height: 6' (1.829 m)    Body mass index is 39.74 kg/m.  General:  Alert and oriented, no acute distress, very pressured rapid speech HEENT: Normal Neck: No bruit or JVD Pulmonary: Clear to auscultation bilaterally Cardiac: Regular Rate and Rhythm without murmur Abdomen: Soft, non-tender, non-distended, no mass, obese Skin: No rash, large varicosities crossing over the right thigh greater saphenous vein approximately 8-10 mm in diameter. Some hemosiderin staining calf area bilaterally and scattered smaller varicosities in the gaiter area bilaterally Extremity Pulses:  2+ radial, brachial, femoral, dorsalis pedis pulses bilaterally Musculoskeletal: No deformity trace pretibial edema  Neurologic: Upper and lower extremity motor 5/5 and symmetric  DATA:  Patient had a venous duplex ultrasound today. This showed evidence of chronic DVT in the left femoral popliteal vein. He had reflux in the saphenofemoral junction and greater saphenous vein on the left side. Vein diameter was 5-6 mm. In the right leg he had no evidence of DVT. He did  have reflux in the deep system. He also had reflux in the greater saphenous and saphenofemoral junction vein diameter 4-7 mm.  ASSESSMENT:  #1 bilateral symptomatic varicose veins. I discussed with the patient today that varicose veins or a nuisance type problem and that these do not put him at increased risk for pulmonary embolus or DVT. I believe the best option initially will be lower extremity compression stockings for symptomatically relief. If he has continued problems or has evidence of worsening skin breakdown or receives no relief with nonsteroidals and compression we could consider a laser ablation especially in the right leg. Laser ablation in the left leg might not be in the patient's best interest due to the previous scar issues from DVT.  #2 prior left leg DVT. The patient  has been treated for greater than a year with anticoagulation. I do not necessarily see any reason that he should be on this long-term. However, since his mother had a DVT at age 100 might be worthwhile to take him off of anticoagulation and do hypercoagulable testing before making a final decision to stop his any coagulation. I will leave this at the discretion of his primary care physician.  The patient will follow-up with [place] on as-needed basis if after 6 months to a year he has had no relief of his symptoms with compression alone.   PLAN:  See above   Ruta Hinds, MD [facility] Office: [phone] Pager: [phone]

## 2017-04-17 ENCOUNTER — Other Ambulatory Visit: Payer: Self-pay | Admitting: Internal Medicine

## 2017-04-17 NOTE — Telephone Encounter (Signed)
Patent got Tylenol #3 on 04/06/2017. Cannot get both tramadol and Tylenol #3 at the same time

## 2017-04-17 NOTE — Telephone Encounter (Signed)
Medical Assistant left message on patient's home and cell voicemail. Voicemail states to give [facility] back [facility] [place] [facility] [phone]. Patient is aware of tylenol 3 being filled on 04/06/17. Patient is aware of both refill request not being appropriate at this time. Patient is not due for any type of pain refill until 05/07/17

## 2017-04-22 ENCOUNTER — Ambulatory Visit: Payer: Medicare Other | Attending: Internal Medicine | Admitting: Pharmacist

## 2017-04-22 DIAGNOSIS — Z79899 Other long term (current) drug therapy: Secondary | ICD-10-CM | POA: Diagnosis not present

## 2017-04-22 DIAGNOSIS — Z760 Encounter for issue of repeat prescription: Secondary | ICD-10-CM | POA: Diagnosis not present

## 2017-04-22 DIAGNOSIS — F909 Attention-deficit hyperactivity disorder, unspecified type: Secondary | ICD-10-CM | POA: Insufficient documentation

## 2017-04-22 DIAGNOSIS — G8929 Other chronic pain: Secondary | ICD-10-CM | POA: Diagnosis not present

## 2017-04-22 NOTE — Patient Instructions (Addendum)
Thanks for coming to see me  You can take ibuprofen 800 mg 3-4 times daily (at least 6 hours apart)  Start the topiramate  Follow up with Dr. Hyman HopesJegede in August

## 2017-04-22 NOTE — Progress Notes (Signed)
S:    Patient arrives in good spirits with his girlfriend.  Presents for medication management. He reports that he has been in pain and wants additional pain medications.  He has ibuprofen 200 mg at home and would like to take 800 mg for the pain. He would also like a muscle relaxer.   Patient denies adherence with medications. He has not picked up his topiramate. He takes Strattera for ADHD but probably needs a refill on it. Denies any other behavioral health medications.  O:    A/P: 1. Medication Adherence: Patient has known adherence challenges (did not start topiramate). Barriers include lack of knowledge,  lack of belief in necessity of treatment, cost.  2. Medication Reconciliation: medication list reviewed and updated. The following issues were noted: patient does not take albuterol (it was from a bronchitis episode years ago) or gabapentin (adverse effects on mood). Patient was provided with a printed medication list.   3. Pain: pain uncontrolled on ibuprofen 400 mg 3 times daily - instructed patient to increase to 800 mg 3-4 times daily after discussing with Dr. Hyman HopesJegede. Renal function good. Will continue to monitor. Patient to pick up topiramate that has already been prescribed and see if that helps with some of the pain too. Can discuss other options with Dr. Hyman HopesJegede after he has tried topiramate. Patient verbalizes understanding.    Written patient instructions provided.  Total time in face to face counseling 15 minutes.   Follow up in Pharmacist Clinic Visit PRN, next visit with Dr. Hyman HopesJegede.

## 2017-04-24 ENCOUNTER — Telehealth: Payer: Self-pay | Admitting: Internal Medicine

## 2017-04-24 NOTE — Telephone Encounter (Signed)
Please advise further

## 2017-04-24 NOTE — Telephone Encounter (Signed)
Dr Hyman HopesJegede patient he saw Kennyth ArnoldStacy on Wednesday for Meds Management patient still having severe pain and he can't sleep and if is something you can do to help him . Please, call him

## 2017-04-28 NOTE — Telephone Encounter (Signed)
Please schedule an office visit. Thank you

## 2017-05-01 NOTE — Telephone Encounter (Signed)
Called pt. To schedule an OV for pain management. LVM to return call and schedule appt.

## 2017-05-04 ENCOUNTER — Encounter (HOSPITAL_COMMUNITY): Payer: Self-pay | Admitting: Nurse Practitioner

## 2017-05-04 ENCOUNTER — Ambulatory Visit (HOSPITAL_COMMUNITY)
Admission: EM | Admit: 2017-05-04 | Discharge: 2017-05-04 | Disposition: A | Discharge status: Home / Self Care | Payer: Medicare Other | Attending: Internal Medicine | Admitting: Internal Medicine

## 2017-05-04 ENCOUNTER — Ambulatory Visit (HOSPITAL_COMMUNITY)
Admission: EM | Admit: 2017-05-04 | Discharge: 2017-05-04 | Disposition: A | Discharge status: Other Acute Inpatient Hospital | Payer: Self-pay

## 2017-05-04 DIAGNOSIS — B029 Zoster without complications: Secondary | ICD-10-CM

## 2017-05-04 MED ORDER — FAMCICLOVIR 500 MG PO TABS: 500.0000 mg | ORAL_TABLET | Freq: Three times a day (TID) | ORAL | 0 refills | Status: AC

## 2017-05-04 MED ORDER — PREDNISONE 50 MG PO TABS: 50.0000 mg | ORAL_TABLET | Freq: Every day | ORAL | 0 refills | Status: DC

## 2017-05-04 NOTE — ED Provider Notes (Signed)
[facility]    CSN: [id number] Arrival date & time: 05/04/17  0300     History   Chief Complaint Chief Complaint  Patient presents with  . Rash    HPI Bradley [name] is a 36 y.o. male. He presents today with several days history of pain in the right flank, with blistery rash erupting starting about 48 hours ago. He is still getting new patches of rash emerging. Rash is itchy and painful. No prior history of this symptom.    HPI  Past Medical History:  Diagnosis Date  . ADHD (attention deficit hyperactivity disorder)   . Bipolar disorder ([place])   . Diabetes mellitus without complication ([place])   . Morbid obesity ([place])   . Panic attack   . Varicose veins of both lower extremities with pain     Patient Active Problem List   Diagnosis Date Noted  . Bipolar disorder, current episode mixed, moderate ([place]) 03/04/2017  . Varicose veins of both lower extremities with pain 02/18/2017  . Chronic deep vein thrombosis (DVT) of proximal vein of left lower extremity ([place]) 11/12/2016  . DVT (deep venous thrombosis), left 06/19/2016  . Smoking addiction 06/19/2016  . HTN (hypertension) 07/27/2013  . Morbid obesity ([place]) 07/27/2013  . Bipolar 1 disorder, mixed, moderate ([place]) 07/27/2013  . DM2 (diabetes mellitus, type 2) ([place]) 04/26/2013    Past Surgical History:  Procedure Laterality Date  . surgery on meatus as a child         Home Medications    Prior to Admission medications   Medication Sig Start Date End Date Taking? Authorizing Provider  acetaminophen-codeine (TYLENOL #3) 300-30 MG tablet Take 1 tablet by mouth every 4 (four) hours as needed. 04/06/17  Yes Tresa Garter, MD  atomoxetine (STRATTERA) 10 MG capsule Take 10 mg by mouth at bedtime.    Yes [provider]  glipiZIDE (GLUCOTROL) 5 MG tablet Take 1 tablet (5 mg total) by mouth 2 (two) times daily before a meal. 02/18/17  Yes Jegede, Olugbemiga E, MD  glucose monitoring kit (FREESTYLE)  monitoring kit 1 each by Does not apply route 4 (four) times daily - after meals and at bedtime. 1 month Diabetic Testing Supplies for QAC-QHS accuchecks. 07/27/13  Yes Thurnell Lose, MD  metFORMIN (GLUCOPHAGE-XR) 500 MG 24 hr tablet TAKE 2 TABLETS WITH BREAKFAST 02/18/17  Yes Jegede, Olugbemiga E, MD  topiramate (TOPAMAX) 100 MG tablet Take 1 tablet (100 mg total) by mouth 2 (two) times daily. 02/18/17  Yes Tresa Garter, MD  famciclovir (FAMVIR) 500 MG tablet Take 1 tablet (500 mg total) by mouth 3 (three) times daily. 05/04/17 05/14/17  Sherlene Shams, MD  predniSONE (DELTASONE) 50 MG tablet Take 1 tablet (50 mg total) by mouth daily. 05/04/17   Sherlene Shams, MD  rivaroxaban (XARELTO) 20 MG TABS tablet Take 1 tablet (20 mg total) by mouth daily with supper. Patient not taking: Reported on 04/22/2017 02/18/17   Tresa Garter, MD    Family History History reviewed. No pertinent family history.  Social History Social History  Substance Use Topics  . Smoking status: Current Every Day Smoker    Packs/day: 1.00    Types: Cigarettes  . Smokeless tobacco: Never Used  . Alcohol use Yes     Comment: Rarely     Allergies   Patient has no known allergies.   Review of Systems Review of Systems  All other systems reviewed and are negative.  Physical Exam Triage Vital Signs ED Triage Vitals  Enc Vitals Group     BP 05/04/17 1942 118/65     Pulse Rate 05/04/17 1942 81     Resp 05/04/17 1942 17     Temp 05/04/17 1942 98.2 F (36.8 C)     Temp Source 05/04/17 1942 Oral     SpO2 05/04/17 1942 100 %     Weight --      Height --      Pain Score 05/04/17 1943 6     Pain Loc --    Updated Vital Signs BP 118/65   Pulse 81   Temp 98.2 F (36.8 C) (Oral)   Resp 17   SpO2 100%   Physical Exam  Constitutional: He is oriented to person, place, and time. No distress.  Alert, nicely groomed  HENT:  Head: Atraumatic.  Eyes:  Conjugate gaze, no eye redness/drainage    Neck: Neck supple.  Cardiovascular: Normal rate.   Pulmonary/Chest: No respiratory distress.  Abdominal: He exhibits no distension.  Musculoskeletal: Normal range of motion.  Neurological: He is alert and oriented to person, place, and time.  Skin: Skin is warm and dry.  No cyanosis Right abdomen and flank with erythematous patches, vesicles clustered on top, some appear hemorrhagic and one blister is necrotic.  Nursing note and vitals reviewed.    UC Treatments / Results   Procedures Procedures (including critical care time) None today  Final Clinical Impressions(s) / UC Diagnoses   Final diagnoses:  Herpes zoster without complication    New Prescriptions New Prescriptions   FAMCICLOVIR (FAMVIR) 500 MG TABLET    Take 1 tablet (500 mg total) by mouth 3 (three) times daily.   PREDNISONE (DELTASONE) 50 MG TABLET    Take 1 tablet (50 mg total) by mouth daily.   Anticipate gradual improvement in rash/discomfort over the next couple weeks.  You may still get a couple new patches.  Wash gently with soap/water 1-2 times daily, apply ointment and bandage.  Recheck if any increasing redness/swelling/pain/drainage or new fever>100.5, or if not starting to improve in a few days.  Keep appointment with your primary care provider later in the month.  Prescriptions for famciclovir (for shingles virus) and prednisone (for pain) sent to the pharmacy.  Ice for 5-10 minutes several times daily may help with discomfort.  Controlled Substance Prescriptions [place] Controlled Substance Registry consulted? Not Applicable   Sherlene Shams, MD 05/08/17 1018

## 2017-05-04 NOTE — Discharge Instructions (Addendum)
Anticipate gradual improvement in rash/discomfort over the next couple weeks.  You may still get a couple new patches.  Wash gently with soap/water 1-2 times daily, apply ointment and bandage.  Recheck if any increasing redness/swelling/pain/drainage or new fever>100.5, or if not starting to improve in a few days.  Keep appointment with your primary care provider later in the month.  Prescriptions for famciclovir (for shingles virus) and prednisone (for pain) sent to the pharmacy.  Ice for 5-10 minutes several times daily may help with discomfort.

## 2017-05-04 NOTE — ED Triage Notes (Signed)
Pt presents with c/o rash. He has a painful rash to the right side of his trunk since Wednesday. The rash has been increasingly painful since onset and is itchy at times

## 2017-05-11 ENCOUNTER — Ambulatory Visit (HOSPITAL_COMMUNITY)
Admission: AD | Admit: 2017-05-11 | Discharge: 2017-05-11 | Disposition: A | Discharge status: Home / Self Care | Payer: Medicare Other | Attending: Psychiatry | Admitting: Psychiatry

## 2017-05-11 ENCOUNTER — Telehealth: Payer: Self-pay | Admitting: Internal Medicine

## 2017-05-11 DIAGNOSIS — E119 Type 2 diabetes mellitus without complications: Secondary | ICD-10-CM | POA: Diagnosis not present

## 2017-05-11 DIAGNOSIS — F909 Attention-deficit hyperactivity disorder, unspecified type: Secondary | ICD-10-CM | POA: Diagnosis not present

## 2017-05-11 DIAGNOSIS — F17210 Nicotine dependence, cigarettes, uncomplicated: Secondary | ICD-10-CM | POA: Diagnosis not present

## 2017-05-11 DIAGNOSIS — F319 Bipolar disorder, unspecified: Secondary | ICD-10-CM | POA: Diagnosis not present

## 2017-05-11 DIAGNOSIS — F3132 Bipolar disorder, current episode depressed, moderate: Secondary | ICD-10-CM | POA: Diagnosis not present

## 2017-05-11 NOTE — H&P (Signed)
Behavioral Health Medical Screening Exam  Bradley MessierBryon D [name] is a 36 y.o. male who [facility] with his girlfriend. Girlfriend present during exam per patient's request. Patient has a history of Bipolar, ADHD, Borderline Personality Disorder, and Diabetes Mellitus. Reports that he has not taken any psychotropic medications for about 8 years and that he has been fine up until about 3 weeks. He reports a lot of stress due to his mother's health. He is seeking outpatient resources only for medication management. Denies audiovisual hallucinations. No indication that he is responding to internal stimuli.   Total Time spent with patient: 30 minutes  Psychiatric Specialty Exam: Physical Exam  Constitutional: He is oriented to person, place, and time. He appears well-developed and well-nourished. No distress.  HENT:  Head: Normocephalic and atraumatic.  Right Ear: External ear normal.  Left Ear: External ear normal.  Eyes: Conjunctivae are normal. Right eye exhibits no discharge. Left eye exhibits no discharge. No scleral icterus.  Neck: Normal range of motion.  Cardiovascular: Normal rate.   Respiratory: Effort normal. No respiratory distress.  Musculoskeletal: Normal range of motion.  Neurological: He is alert and oriented to person, place, and time.  Skin: Skin is warm and dry. Rash noted. Rash is vesicular. He is not diaphoretic.     Psychiatric: His speech is normal and behavior is normal. His mood appears anxious. His affect is not blunt, not labile and not inappropriate. Thought content is not paranoid and not delusional. Cognition and memory are normal. He does not express impulsivity or inappropriate judgment. He exhibits a depressed mood. He expresses no homicidal and no suicidal ideation.    Review of Systems  Musculoskeletal: Positive for back pain.  Skin: Positive for rash.       Reports shingles right abdomen. Started famcyclovir 6 days ago.   Psychiatric/Behavioral:  Positive for depression and suicidal ideas. Negative for hallucinations, memory loss and substance abuse. The patient is nervous/anxious and has insomnia.   All other systems reviewed and are negative.   Blood pressure 133/84, pulse 84, temperature 98.5 F (36.9 C), temperature source Oral, resp. rate 18, SpO2 100 %.There is no height or weight on file to calculate BMI.  General Appearance: Disheveled  Eye Contact:  Good  Speech:  Clear and Coherent and Normal Rate  Volume:  Normal  Mood:  Anxious, Depressed and Worthless  Affect:  Congruent, Depressed and Full Range  Thought Process:  Coherent and Goal Directed  Orientation:  Full (Time, Place, and Person)  Thought Content:  Logical and Hallucinations: None  Suicidal Thoughts:  Denies current suicidal ideation. Reports history of suicidal ideations with multiple plans, but not intent. States that he is too "coward" to attempt suicide and that he would never want to leave his girlfriend or mother behind.  Homicidal Thoughts:  No  Memory:  Immediate;   Good Recent;   Good Remote;   Good  Judgement:  Good  Insight:  Fair  Psychomotor Activity:  Restlessness  Concentration: Concentration: Good and Attention Span: Good  Recall:  Good  Fund of Knowledge:Good  Language: Good  Akathisia:  No  Handed:  Right  AIMS (if indicated):     Assets:  Communication Skills Desire for Improvement Housing Intimacy Leisure Time Transportation  Sleep:       Musculoskeletal: Strength & Muscle Tone: within normal limits Gait & Station: normal   Blood pressure 133/84, pulse 84, temperature 98.5 F (36.9 C), temperature source Oral, resp. rate 18, SpO2  100 %.  Recommendations:  Based on my evaluation the patient does not appear to have an emergency medical condition.  Jackelyn Poling, NP 05/11/2017, 1:59 AM

## 2017-05-11 NOTE — BH Assessment (Signed)
Tele Assessment Note   Bradley [name] is an 36 y.o. single male who presents to [facility] accompanied by his girlfriend Bradley [name], who participated in assessment at Pt's request. Pt reports he was diagnosed with mental health problems as an adolescent and is currently diagnosed with bipolar disorder, ADHD and borderline personality disorder. Pt reports he has been off psychiatric medications for 8 years and took Strattera and Topamax. He reports he was doing weeks until three weeks ago who his mother left her nursing home and tried unsuccessfully to live in an apartment. Pt's mother has to return to the nursing home and blames Pt for allowing her to live with him and caring for him. Pt describes his mother as very verbally abusive. Pt says he cannot care for her anymore and feels overwhelmed. He says he was recently diagnosed with shingles due to the stress. Pt says he has anxiety and experiences infrequent panic attacks. Pt reports symptoms including crying spells, social withdrawal, loss of interest in usual pleasures, fatigue, irritability, decreased concentration, decreased sleep, decreased appetite and feelings of guilt and hopelessness. Pt states he has experienced recurring suicidal ideation for years with plans including overdosing, designing a machine that would drop a knife on his head or jumping from an overpass. Pt insists her would never act on these thoughts because "I'm a coward and I'm afraid of pain." Pt denies any history of suicide attempts. Pt denies any history of intentional self-injurious behavior. Protective factors against suicide include good support from girlfriend, future orientation, no access to firearms and no prior attempts. Pt denies current auditory or visual hallucinations. Pt denies alcohol or substance abuse.    Pt cannot identify any significant stressors other than his relationship with his mother. He identifies some financial stress. Pt lives with his girlfriend  and identifies her as his primary support. Pt's girlfriend's family is also supportive. Pt says he is on disability due to his mental health problems. He states he was psychiatrically hospitalized three times in the past with his last inpatient treatment at age 80 at [facility]. Pt denies any legal issues.  Pt is casually dressed, alert, oriented x4 with normal speech and normal motor behavior. Eye contact is good and Pt is tearful at times. Pt's mood is depressed and affect is congruent with mood. Thought process is coherent and relevant. There is no indication Pt is currently responding to internal stimuli or experiencing delusional thought content. Pt was cooperative throughout assessment. Pt says he does not want to be psychiatrically hospitalized and came to [facility] seeking someone to talk to tonight and to inquire about outpatient treatment. Pt's girlfriend says she is concerned about Pt's depression but has no concerns Pt would harm himself.   Diagnosis: Bipolar I Disorder, Current Episode Depressed, Moderate  Past Medical History:  Past Medical History:  Diagnosis Date  . ADHD (attention deficit hyperactivity disorder)   . Bipolar disorder (HCC)   . Diabetes mellitus without complication (HCC)   . Morbid obesity (H[facility])   . Panic attack   . Varicose veins of both lower extremities with pain     Past Surgical History:  Procedure Laterality Date  . surgery on meatus as a child      Family History: No family history on file.  Social History:  reports that he has been smoking Cigarettes.  He has been smoking about 1.00 pack per day. He has never used smokeless tobacco. He reports that he drinks alcohol. He reports  that he does not use drugs.  Additional Social History:  Alcohol / Drug Use Pain Medications: None Prescriptions: Metformin, Glipizide Over the Counter: Ibuprofen History of alcohol / drug use?: No history of alcohol / drug abuse Longest period of sobriety  (when/how long): NA  CIWA:   COWS:    PATIENT STRENGTHS: (choose at least two) Ability for insight Average or above average intelligence Capable of independent living MetallurgistCommunication skills Financial means General fund of knowledge Motivation for treatment/growth Special hobby/interest Supportive family/friends  Allergies: No Known Allergies  Home Medications:  (Not in a hospital admission)  OB/GYN Status:  No LMP for male patient.  General Assessment Data [facility] TTS Assessment: In system Is this a Tele or Face-to-Face Assessment?: Face-to-Face Is this an Initial Assessment or a Re-assessment for this encounter?: Initial Assessment Marital status: Single Maiden name: NA Is patient pregnant?: No Pregnancy Status: No Living Arrangements: Spouse/significant other Can pt return to current living arrangement?: Yes Admission Status: Voluntary Is patient capable of signing voluntary admission?: Yes Referral Source: Self/Family/Friend Insurance type: Medicare  Medical Screening Exam [facility] Walk[facility] ONLY) Medical Exam completed: Yes Nira Conn(Bradley Berry, NP)  Crisis Care Plan Living Arrangements: Spouse/significant other Legal Guardian: Other: (Self) Name of Psychiatrist: None Name of Therapist: None  Education Status Is patient currently in school?: No Current Grade: NA Highest grade of school patient has completed: Some college Name of school: NA Contact person: NA  Risk to self with the past 6 months Suicidal Ideation: Yes-Currently Present Has patient been a risk to self within the past 6 months prior to admission? : No Suicidal Intent: No Has patient had any suicidal intent within the past 6 months prior to admission? : No Is patient at risk for suicide?: No Suicidal Plan?: No Has patient had any suicidal plan within the past 6 months prior to admission? : Yes Specify Current Suicidal Plan: OD on Ibuprofen Access to Means:  Yes Specify Access to Suicidal Means: Access to Ibuprofen What has been your use of drugs/alcohol within the last 12 months?: Pt denies Previous Attempts/Gestures: No How many times?: 0 Other Self Harm Risks: None Triggers for Past Attempts: None known Intentional Self Injurious Behavior: None Family Suicide History: Yes (Cousin died by suicide) Recent stressful life event(s): Other (Comment), Conflict (Comment) (Conflict with mother) Persecutory voices/beliefs?: No Depression: Yes Depression Symptoms: Despondent, Tearfulness, Isolating, Fatigue, Guilt, Loss of interest in usual pleasures, Feeling worthless/self pity, Feeling angry/irritable Substance abuse history and/or treatment for substance abuse?: No Suicide prevention information given to non-admitted patients: Not applicable  Risk to Others within the past 6 months Homicidal Ideation: No Does patient have any lifetime risk of violence toward others beyond the six months prior to admission? : No Thoughts of Harm to Others: No Current Homicidal Intent: No Current Homicidal Plan: No Access to Homicidal Means: No Identified Victim: None History of harm to others?: No Assessment of Violence: None Noted Violent Behavior Description: Pt denies history of violence Does patient have access to weapons?: No Criminal Charges Pending?: No Does patient have a court date: No Is patient on probation?: No  Psychosis Hallucinations: None noted Delusions: None noted  Mental Status Report Appearance/Hygiene: Other (Comment) (Casually dressed) Eye Contact: Good Motor Activity: Unremarkable Speech: Logical/coherent Level of Consciousness: Alert Mood: Depressed, Anxious Affect: Depressed, Anxious Anxiety Level: Panic Attacks Panic attack frequency: Infrequent Most recent panic attack: three days ago Thought Processes: Coherent, Relevant Judgement: Unimpaired Orientation: Person, Place, Time, Situation, Appropriate for developmental  age Obsessive Compulsive Thoughts/Behaviors: None  Cognitive Functioning Concentration: Fair Memory: Recent Intact, Remote Intact IQ: Average Insight: Fair Impulse Control: Fair Appetite: Fair Weight Loss: 0 Weight Gain: 0 Sleep: Decreased Total Hours of Sleep: 4 Vegetative Symptoms: None  ADLScreening [facility] Assessment Services) Patient's cognitive ability adequate to safely complete daily activities?: Yes Patient able to express need for assistance with ADLs?: Yes Independently performs ADLs?: Yes (appropriate for developmental age)  Prior Inpatient Therapy Prior Inpatient Therapy: Yes Prior Therapy Dates: Ages 63, 68, 18 Prior Therapy Facilty/Provider(s): [facility], other facilities Reason for Treatment: Bipolar disorder, ADHD  Prior Outpatient Therapy Prior Outpatient Therapy: Yes Prior Therapy Dates: 2010 Prior Therapy Facilty/Provider(s): [facility] Reason for Treatment: Bipolar disorder, ADHD Does patient have an ACCT team?: No Does patient have Intensive In-House Services?  : No Does patient have [facility] services? : No Does patient have P4CC services?: No  ADL Screening (condition at time of admission) Patient's cognitive ability adequate to safely complete daily activities?: Yes Is the patient deaf or have difficulty hearing?: No Does the patient have difficulty seeing, even when wearing glasses/contacts?: No Does the patient have difficulty concentrating, remembering, or making decisions?: No Patient able to express need for assistance with ADLs?: Yes Does the patient have difficulty dressing or bathing?: No Independently performs ADLs?: Yes (appropriate for developmental age) Does the patient have difficulty walking or climbing stairs?: No Weakness of Legs: None Weakness of Arms/Hands: None       Abuse/Neglect Assessment (Assessment to be complete while patient is alone) Physical Abuse: Denies Verbal Abuse: Yes, past (Comment), Yes, present  (Comment) (Pt reports mother is verbally abusive) Sexual Abuse: Denies Exploitation of patient/patient's resources: Denies Self-Neglect: Denies     Merchant navy officer (For Healthcare) Does Patient Have a Medical Advance Directive?: No Would patient like information on creating a medical advance directive?: Yes (ED - Information included in AVS)    Additional Information 1:1 In Past 12 Months?: No CIRT Risk: No Elopement Risk: No Does patient have medical clearance?: No     Disposition: Gave clinical report to Nira Conn, NP who completed MSE and offered inpatient psychiatric treatment. Pt refused and said he wanted to pursue outpatient treatment. Pt agrees not to act on suicidal thoughts and agrees to contact crisis services should he feel unsafe. Pt given outpatient referrals for [facility], [facility] [place] and [facility]. Pt given suicide prevention information and 24-hours crisis numbers.  Disposition Initial Assessment Completed for this Encounter: Yes Disposition of Patient: Outpatient treatment Type of outpatient treatment: Adult   Pamalee Leyden, [facility], [facility], [facility] Triage Specialist [phone]   Patsy Baltimore, Harlin Rain 05/11/2017 1:50 AM

## 2017-05-11 NOTE — Telephone Encounter (Signed)
Pt called requesting referral to [facility], please f/up

## 2017-05-13 DIAGNOSIS — F3132 Bipolar disorder, current episode depressed, moderate: Secondary | ICD-10-CM | POA: Diagnosis not present

## 2017-05-14 NOTE — Telephone Encounter (Signed)
Patient is already being seen by psychiatry.

## 2017-05-20 ENCOUNTER — Encounter: Payer: Self-pay | Admitting: Internal Medicine

## 2017-05-20 ENCOUNTER — Ambulatory Visit: Payer: Medicare Other | Attending: Internal Medicine | Admitting: Internal Medicine

## 2017-05-20 VITALS — BP 110/69 | HR 75 | Temp 98.2°F | Resp 18 | Ht 72.0 in | Wt 305.0 lb

## 2017-05-20 DIAGNOSIS — G894 Chronic pain syndrome: Secondary | ICD-10-CM | POA: Insufficient documentation

## 2017-05-20 DIAGNOSIS — Z7984 Long term (current) use of oral hypoglycemic drugs: Secondary | ICD-10-CM | POA: Insufficient documentation

## 2017-05-20 DIAGNOSIS — Z79899 Other long term (current) drug therapy: Secondary | ICD-10-CM | POA: Diagnosis not present

## 2017-05-20 DIAGNOSIS — Z79891 Long term (current) use of opiate analgesic: Secondary | ICD-10-CM | POA: Diagnosis not present

## 2017-05-20 DIAGNOSIS — Z6841 Body Mass Index (BMI) 40.0 and over, adult: Secondary | ICD-10-CM | POA: Insufficient documentation

## 2017-05-20 DIAGNOSIS — F3162 Bipolar disorder, current episode mixed, moderate: Secondary | ICD-10-CM | POA: Diagnosis not present

## 2017-05-20 DIAGNOSIS — F909 Attention-deficit hyperactivity disorder, unspecified type: Secondary | ICD-10-CM | POA: Diagnosis not present

## 2017-05-20 DIAGNOSIS — F17200 Nicotine dependence, unspecified, uncomplicated: Secondary | ICD-10-CM | POA: Diagnosis not present

## 2017-05-20 DIAGNOSIS — F17210 Nicotine dependence, cigarettes, uncomplicated: Secondary | ICD-10-CM | POA: Insufficient documentation

## 2017-05-20 DIAGNOSIS — E119 Type 2 diabetes mellitus without complications: Secondary | ICD-10-CM | POA: Diagnosis not present

## 2017-05-20 LAB — GLUCOSE, POCT (MANUAL RESULT ENTRY): POC GLUCOSE: 122 mg/dL — AB (ref 70–99)

## 2017-05-20 LAB — POCT GLYCOSYLATED HEMOGLOBIN (HGB A1C): Hemoglobin A1C: 6.9

## 2017-05-20 MED ORDER — METFORMIN HCL ER 500 MG PO TB24: ORAL_TABLET | ORAL | 3 refills | Status: DC

## 2017-05-20 MED ORDER — TOPIRAMATE 100 MG PO TABS: 100.0000 mg | ORAL_TABLET | Freq: Two times a day (BID) | ORAL | 3 refills | Status: DC

## 2017-05-20 MED ORDER — GLIPIZIDE 5 MG PO TABS: 5.0000 mg | ORAL_TABLET | Freq: Two times a day (BID) | ORAL | 3 refills | Status: DC

## 2017-05-20 MED ORDER — LURASIDONE HCL 40 MG PO TABS: 40.0000 mg | ORAL_TABLET | Freq: Every day | ORAL | 3 refills | Status: DC

## 2017-05-20 MED ORDER — TRAMADOL HCL 50 MG PO TABS: 50.0000 mg | ORAL_TABLET | Freq: Three times a day (TID) | ORAL | 0 refills | Status: DC | PRN

## 2017-05-20 MED FILL — traMADol HCL 50 MG TABS: 50 | 20 days supply | Qty: 60 | Fill #0

## 2017-05-20 NOTE — Patient Instructions (Signed)
Diabetes Mellitus and Food It is important for you to manage your blood sugar (glucose) level. Your blood glucose level can be greatly affected by what you eat. Eating healthier foods in the appropriate amounts throughout the day at about the same time each day will help you control your blood glucose level. It can also help slow or prevent worsening of your diabetes mellitus. Healthy eating may even help you improve the level of your blood pressure and reach or maintain a healthy weight. General recommendations for healthful eating and cooking habits include:  Eating meals and snacks regularly. Avoid going long periods of time without eating to lose weight.  Eating a diet that consists mainly of plant-based foods, such as fruits, vegetables, nuts, legumes, and whole grains.  Using low-heat cooking methods, such as baking, instead of high-heat cooking methods, such as deep frying.  Work with your dietitian to make sure you understand how to use the Nutrition Facts information on food labels. How can food affect me? Carbohydrates Carbohydrates affect your blood glucose level more than any other type of food. Your dietitian will help you determine how many carbohydrates to eat at each meal and teach you how to count carbohydrates. Counting carbohydrates is important to keep your blood glucose at a healthy level, especially if you are using insulin or taking certain medicines for diabetes mellitus. Alcohol Alcohol can cause sudden decreases in blood glucose (hypoglycemia), especially if you use insulin or take certain medicines for diabetes mellitus. Hypoglycemia can be a life-threatening condition. Symptoms of hypoglycemia (sleepiness, dizziness, and disorientation) are similar to symptoms of having too much alcohol. If your health care provider has given you approval to drink alcohol, do so in moderation and use the following guidelines:  Women should not have more than one drink per day, and men  should not have more than two drinks per day. One drink is equal to: ? 12 oz of beer. ? 5 oz of wine. ? 1 oz of hard liquor.  Do not drink on an empty stomach.  Keep yourself hydrated. Have water, diet soda, or unsweetened iced tea.  Regular soda, juice, and other mixers might contain a lot of carbohydrates and should be counted.  What foods are not recommended? As you make food choices, it is important to remember that all foods are not the same. Some foods have fewer nutrients per serving than other foods, even though they might have the same number of calories or carbohydrates. It is difficult to get your body what it needs when you eat foods with fewer nutrients. Examples of foods that you should avoid that are high in calories and carbohydrates but low in nutrients include:  Trans fats (most processed foods list trans fats on the Nutrition Facts label).  Regular soda.  Juice.  Candy.  Sweets, such as cake, pie, doughnuts, and cookies.  Fried foods.  What foods can I eat? Eat nutrient-rich foods, which will nourish your body and keep you healthy. The food you should eat also will depend on several factors, including:  The calories you need.  The medicines you take.  Your weight.  Your blood glucose level.  Your blood pressure level.  Your cholesterol level.  You should eat a variety of foods, including:  Protein. ? Lean cuts of meat. ? Proteins low in saturated fats, such as fish, egg whites, and beans. Avoid processed meats.  Fruits and vegetables. ? Fruits and vegetables that may help control blood glucose levels, such as apples,   mangoes, and yams.  Dairy products. ? Choose fat-free or low-fat dairy products, such as milk, yogurt, and cheese.  Grains, bread, pasta, and rice. ? Choose whole grain products, such as multigrain bread, whole oats, and brown rice. These foods may help control blood pressure.  Fats. ? Foods containing healthful fats, such as  nuts, avocado, olive oil, canola oil, and fish.  Does everyone with diabetes mellitus have the same meal plan? Because every person with diabetes mellitus is different, there is not one meal plan that works for everyone. It is very important that you meet with a dietitian who will help you create a meal plan that is just right for you. This information is not intended to replace advice given to you by your health care provider. Make sure you discuss any questions you have with your health care provider. Document Released: 06/12/2005 Document Revised: 02/21/2016 Document Reviewed: 08/12/2013 Elsevier Interactive Patient Education  2017 Else[employer] Inc. Diabetes Mellitus and Exercise Exercising regularly is important for your overall health, especially when you have diabetes (diabetes mellitus). Exercising is not only about losing weight. It has many health benefits, such as increasing muscle strength and bone density and reducing body fat and stress. This leads to improved fitness, flexibility, and endurance, all of which result in better overall health. Exercise has additional benefits for people with diabetes, including:  Reducing appetite.  Helping to lower and control blood glucose.  Lowering blood pressure.  Helping to control amounts of fatty substances (lipids) in the blood, such as cholesterol and triglycerides.  Helping the body to respond better to insulin (improving insulin sensitivity).  Reducing how much insulin the body needs.  Decreasing the risk for heart disease by: ? Lowering cholesterol and triglyceride levels. ? Increasing the levels of good cholesterol. ? Lowering blood glucose levels.  What is my activity plan? Your health care provider or certified diabetes educator can help you make a plan for the type and frequency of exercise (activity plan) that works for you. Make sure that you:  Do at least 150 minutes of moderate-intensity or vigorous-intensity exercise each  week. This could be brisk walking, biking, or water aerobics. ? Do stretching and strength exercises, such as yoga or weightlifting, at least 2 times a week. ? Spread out your activity over at least 3 days of the week.  Get some form of physical activity every day. ? Do not go more than 2 days in a row without some kind of physical activity. ? Avoid being inactive for more than 90 minutes at a time. Take frequent breaks to walk or stretch.  Choose a type of exercise or activity that you enjoy, and set realistic goals.  Start slowly, and gradually increase the intensity of your exercise over time.  What do I need to know about managing my diabetes?  Check your blood glucose before and after exercising. ? If your blood glucose is higher than 240 mg/dL (13.3 mmol/L) before you exercise, check your urine for ketones. If you have ketones in your urine, do not exercise until your blood glucose returns to normal.  Know the symptoms of low blood glucose (hypoglycemia) and how to treat it. Your risk for hypoglycemia increases during and after exercise. Common symptoms of hypoglycemia can include: ? Hunger. ? Anxiety. ? Sweating and feeling clammy. ? Confusion. ? Dizziness or feeling light-headed. ? Increased heart rate or palpitations. ? Blurry vision. ? Tingling or numbness around the mouth, lips, or tongue. ? Tremors or shakes. ?   Irritability.  Keep a rapid-acting carbohydrate snack available before, during, and after exercise to help prevent or treat hypoglycemia.  Avoid injecting insulin into areas of the body that are going to be exercised. For example, avoid injecting insulin into: ? The arms, when playing tennis. ? The legs, when jogging.  Keep records of your exercise habits. Doing this can help you and your health care provider adjust your diabetes management plan as needed. Write down: ? Food that you eat before and after you exercise. ? Blood glucose levels before and after you  exercise. ? The type and amount of exercise you have done. ? When your insulin is expected to peak, if you use insulin. Avoid exercising at times when your insulin is peaking.  When you start a new exercise or activity, work with your health care provider to make sure the activity is safe for you, and to adjust your insulin, medicines, or food intake as needed.  Drink plenty of water while you exercise to prevent dehydration or heat stroke. Drink enough fluid to keep your urine clear or pale yellow. This information is not intended to replace advice given to you by your health care provider. Make sure you discuss any questions you have with your health care provider. Document Released: 12/06/2003 Document Revised: 04/04/2016 Document Reviewed: 02/25/2016 Elsevier Interactive Patient Education  2018 Else[employer] Inc. Blood Glucose Monitoring, Adult Monitoring your blood sugar (glucose) helps you manage your diabetes. It also helps you and your health care provider determine how well your diabetes management plan is working. Blood glucose monitoring involves checking your blood glucose as often as directed, and keeping a record (log) of your results over time. Why should I monitor my blood glucose? Checking your blood glucose regularly can:  Help you understand how food, exercise, illnesses, and medicines affect your blood glucose.  Let you know what your blood glucose is at any time. You can quickly tell if you are having low blood glucose (hypoglycemia) or high blood glucose (hyperglycemia).  Help you and your health care provider adjust your medicines as needed.  When should I check my blood glucose? Follow instructions from your health care provider about how often to check your blood glucose. This may depend on:  The type of diabetes you have.  How well-controlled your diabetes is.  Medicines you are taking.  If you have type 1 diabetes:  Check your blood glucose at least 2 times a  day.  Also check your blood glucose: ? Before every insulin injection. ? Before and after exercise. ? Between meals. ? 2 hours after a meal. ? Occasionally between 2:00 a.m. and 3:00 a.m., as directed. ? Before potentially dangerous tasks, like driving or using heavy machinery. ? At bedtime.  You may need to check your blood glucose more often, up to 6-10 times a day: ? If you use an insulin pump. ? If you need multiple daily injections (MDI). ? If your diabetes is not well-controlled. ? If you are ill. ? If you have a history of severe hypoglycemia. ? If you have a history of not knowing when your blood glucose is getting low (hypoglycemia unawareness). If you have type 2 diabetes:  If you take insulin or other diabetes medicines, check your blood glucose at least 2 times a day.  If you are on intensive insulin therapy, check your blood glucose at least 4 times a day. Occasionally, you may also need to check between 2:00 a.m. and 3:00 a.m., as directed.    Also check your blood glucose: ? Before and after exercise. ? Before potentially dangerous tasks, like driving or using heavy machinery.  You may need to check your blood glucose more often if: ? Your medicine is being adjusted. ? Your diabetes is not well-controlled. ? You are ill. What is a blood glucose log?  A blood glucose log is a record of your blood glucose readings. It helps you and your health care provider: ? Look for patterns in your blood glucose over time. ? Adjust your diabetes management plan as needed.  Every time you check your blood glucose, write down your result and notes about things that may be affecting your blood glucose, such as your diet and exercise for the day.  Most glucose meters store a record of glucose readings in the meter. Some meters allow you to download your records to a computer. How do I check my blood glucose? Follow these steps to get accurate readings of your blood  glucose: Supplies needed   Blood glucose meter.  Test strips for your meter. Each meter has its own strips. You must use the strips that come with your meter.  A needle to prick your finger (lancet). Do not use lancets more than once.  A device that holds the lancet (lancing device).  A journal or log book to write down your results. Procedure  Wash your hands with soap and water.  Prick the side of your finger (not the tip) with the lancet. Use a different finger each time.  Gently rub the finger until a small drop of blood appears.  Follow instructions that come with your meter for inserting the test strip, applying blood to the strip, and using your blood glucose meter.  Write down your result and any notes. Alternative testing sites  Some meters allow you to use areas of your body other than your finger (alternative sites) to test your blood.  If you think you may have hypoglycemia, or if you have hypoglycemia unawareness, do not use alternative sites. Use your finger instead.  Alternative sites may not be as accurate as the fingers, because blood flow is slower in these areas. This means that the result you get may be delayed, and it may be different from the result that you would get from your finger.  The most common alternative sites are: ? Forearm. ? Thigh. ? Palm of the hand. Additional tips  Always keep your supplies with you.  If you have questions or need help, all blood glucose meters have a 24-hour "hotline" number that you can call. You may also contact your health care provider.  After you use a few boxes of test strips, adjust (calibrate) your blood glucose meter by following instructions that came with your meter. This information is not intended to replace advice given to you by your health care provider. Make sure you discuss any questions you have with your health care provider. Document Released: 09/18/2003 Document Revised: 04/04/2016 Document  Reviewed: 02/25/2016 Elsevier Interactive Patient Education  2017 Elsevier Inc.  

## 2017-05-20 NOTE — Progress Notes (Signed)
Bradley [name], is a 36 y.o. male  LKG:[id number]  GUY:[id number]  DOB - 13-Oct-1980  Chief Complaint  Patient presents with  . Diabetes       Subjective:   Bradley [name] is a 36 y.o. male with history of Hypertension, type 2 diabetes mellitus, ADHD, Bipolar Disorder and major depressive disorder who presents here today for ED follow up visit. Patient was recently seen at the urgent care for complaints of pain in the right flank with blistering rash eruption, itchy and painful. Diagnosed herpes zoster and treated with famciclovir and prednisone, discharged to be followed up today. Patient is still on his medications, pain is much improved but in need of some pain medications. Blood sugar is better controlled per patient. Hemoglobin A1c today is 7.0% as a gas 10.6% in May this year. Patient needs refill of all medications and a referral to a psychiatrist or behavioral therapist for ongoing care of his ADHD and bipolar disorder (already referred). He denies any suicidal ideations or thoughts. Patient has No headache, No chest pain, No Nausea, No new weakness tingling or numbness, No Cough - SOB.  Problem  Chronic Pain Syndrome    ALLERGIES: No Known Allergies  PAST MEDICAL HISTORY: Past Medical History:  Diagnosis Date  . ADHD (attention deficit hyperactivity disorder)   . Bipolar disorder ([place])   . Diabetes mellitus without complication ([place])   . Morbid obesity ([place])   . Panic attack   . Varicose veins of both lower extremities with pain     MEDICATIONS AT HOME: Prior to Admission medications   Medication Sig Start Date End Date Taking? Authorizing Provider  atomoxetine (STRATTERA) 10 MG capsule Take 10 mg by mouth at bedtime.    Yes [provider]  glipiZIDE (GLUCOTROL) 5 MG tablet Take 1 tablet (5 mg total) by mouth 2 (two) times daily before a meal. 05/20/17  Yes Neha Waight E, MD  glucose monitoring kit (FREESTYLE) monitoring kit 1 each by Does not apply  route 4 (four) times daily - after meals and at bedtime. 1 month Diabetic Testing Supplies for QAC-QHS accuchecks. 07/27/13  Yes Thurnell Lose, MD  lurasidone (LATUDA) 40 MG TABS tablet Take 1 tablet (40 mg total) by mouth daily with breakfast. 05/20/17  Yes Orchid Glassberg E, MD  metFORMIN (GLUCOPHAGE-XR) 500 MG 24 hr tablet TAKE 2 TABLETS WITH BREAKFAST 05/20/17  Yes  Paone E, MD  predniSONE (DELTASONE) 50 MG tablet Take 1 tablet (50 mg total) by mouth daily. 05/04/17  Yes Sherlene Shams, MD  topiramate (TOPAMAX) 100 MG tablet Take 1 tablet (100 mg total) by mouth 2 (two) times daily. 05/20/17  Yes Tresa Garter, MD  traMADol (ULTRAM) 50 MG tablet Take 1 tablet (50 mg total) by mouth every 8 (eight) hours as needed. 05/20/17   Tresa Garter, MD    Objective:   Vitals:   05/20/17 1454  BP: 110/69  Pulse: 75  Resp: 18  Temp: 98.2 F (36.8 C)  TempSrc: Oral  SpO2: 100%  Weight: (!) 305 lb (138.3 kg)  Height: 6' (1.829 m)   Exam General appearance : Awake, alert, not in any distress. Speech Clear. Not toxic looking, obese, poor dentition HEENT: Atraumatic and Normocephalic, pupils equally reactive to light and accomodation Neck: Supple, no JVD. No cervical lymphadenopathy.  Chest: Good air entry bilaterally, no added sounds  CVS: S1 S2 regular, no murmurs.  Abdomen: Bowel sounds present, Non tender and not distended with no gaurding, rigidity  or rebound. Extremities: B/L Lower Ext shows no edema, both legs are warm to touch Neurology: Awake alert, and oriented X 3, CN II-XII intact, Non focal Skin: No Rash  Data Review Lab Results  Component Value Date   HGBA1C 10.6 02/18/2017   HGBA1C 12.7 11/12/2016   HGBA1C 12.3 06/19/2016    Assessment & Plan   1. Type 2 diabetes mellitus without complication, without long-term current use of insulin (HCC)  - POCT A1C - Glucose (CBG)  - glipiZIDE (GLUCOTROL) 5 MG tablet; Take 1 tablet (5 mg total) by mouth  2 (two) times daily before a meal.  Dispense: 60 tablet; Refill: 3 - metFORMIN (GLUCOPHAGE-XR) 500 MG 24 hr tablet; TAKE 2 TABLETS WITH BREAKFAST  Dispense: 180 tablet; Refill: 3  2. Bipolar disorder, current episode mixed, moderate (HCC)  - lurasidone (LATUDA) 40 MG TABS tablet; Take 1 tablet (40 mg total) by mouth daily with breakfast.  Dispense: 30 tablet; Refill: 3 - topiramate (TOPAMAX) 100 MG tablet; Take 1 tablet (100 mg total) by mouth 2 (two) times daily.  Dispense: 60 tablet; Refill: 3  3. Smoking addiction Shmiel was counseled on the dangers of tobacco use, and was advised to quit. Reviewed strategies to maximize success, including removing cigarettes and smoking materials from environment, stress management and support of family/friends.  4. Chronic pain syndrome  - traMADol (ULTRAM) 50 MG tablet; Take 1 tablet (50 mg total) by mouth every 8 (eight) hours as needed.  Dispense: 60 tablet; Refill: 0  Patient have been counseled extensively about nutrition and exercise. Other issues discussed during this visit include: low cholesterol diet, weight control and daily exercise, importance of adherence with medications and regular follow-up.   Return in about 3 months (around 08/20/2017) for Hemoglobin A1C and Follow up, DM, Follow up Pain and comorbidities.  The patient was given clear instructions to go to ER or return to medical center if symptoms don't improve, worsen or new problems develop. The patient verbalized understanding. The patient was told to call to get lab results if they haven't heard anything in the next week.   This note has been created with Surveyor, quantity. Any transcriptional errors are unintentional.    Angelica Chessman, MD, [place], Karilyn Cota, [place] and [place], [place]   05/20/2017, 3:38 PM

## 2017-05-21 LAB — CBC WITH DIFFERENTIAL/PLATELET
BASOS ABS: 0 10*3/uL (ref 0.0–0.2)
Basos: 0 %
EOS (ABSOLUTE): 0.2 10*3/uL (ref 0.0–0.4)
EOS: 2 %
HEMATOCRIT: 48.5 % (ref 37.5–51.0)
HEMOGLOBIN: 16.5 g/dL (ref 13.0–17.7)
IMMATURE GRANS (ABS): 0 10*3/uL (ref 0.0–0.1)
Immature Granulocytes: 0 %
LYMPHS ABS: 2.5 10*3/uL (ref 0.7–3.1)
LYMPHS: 30 %
MCH: 28.7 pg (ref 26.6–33.0)
MCHC: 34 g/dL (ref 31.5–35.7)
MCV: 85 fL (ref 79–97)
MONOCYTES: 5 %
Monocytes Absolute: 0.4 10*3/uL (ref 0.1–0.9)
Neutrophils Absolute: 5.3 10*3/uL (ref 1.4–7.0)
Neutrophils: 63 %
Platelets: 140 10*3/uL — ABNORMAL LOW (ref 150–379)
RBC: 5.74 x10E6/uL (ref 4.14–5.80)
RDW: 14.6 % (ref 12.3–15.4)
WBC: 8.4 10*3/uL (ref 3.4–10.8)

## 2017-05-21 LAB — HEMOGLOBIN A1C
ESTIMATED AVERAGE GLUCOSE: 154 mg/dL
HEMOGLOBIN A1C: 7 % — AB (ref 4.8–5.6)

## 2017-05-28 ENCOUNTER — Other Ambulatory Visit: Payer: Self-pay | Admitting: *Deleted

## 2017-05-28 DIAGNOSIS — G894 Chronic pain syndrome: Secondary | ICD-10-CM

## 2017-05-28 MED ORDER — TRAMADOL HCL 50 MG PO TABS: 50.0000 mg | ORAL_TABLET | Freq: Three times a day (TID) | ORAL | 0 refills | Status: DC | PRN
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
## 2017-05-28 NOTE — Telephone Encounter (Signed)
ERROR. PATIENTS CHARTS NEED TO BE MERGED. IT IS NOTIFIED

## 2017-05-28 NOTE — Telephone Encounter (Signed)
-----   Message from Quentin Angstlugbemiga E Jegede, MD sent at 05/26/2017 12:37 PM EDT ----- Normal lab results

## 2017-05-29 NOTE — Telephone Encounter (Signed)
-----   Message from Olugbemiga E Jegede, MD sent at 05/26/2017 12:37 PM EDT ----- Normal lab results 

## 2017-06-02 ENCOUNTER — Telehealth: Payer: Self-pay | Admitting: Internal Medicine

## 2017-06-02 NOTE — Telephone Encounter (Signed)
Pt. Called requesting to speak with his nurse regarding an updated he discussed with his nurse on Friday. Please f/u with pt.
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
## 2017-06-10 ENCOUNTER — Emergency Department (HOSPITAL_COMMUNITY)
Admission: EM | Admit: 2017-06-10 | Discharge: 2017-06-10 | Discharge status: Against Medical Advice | Payer: Medicare Other | Attending: Emergency Medicine | Admitting: Emergency Medicine

## 2017-06-10 ENCOUNTER — Encounter (HOSPITAL_COMMUNITY): Payer: Self-pay | Admitting: Emergency Medicine

## 2017-06-10 DIAGNOSIS — M79604 Pain in right leg: Secondary | ICD-10-CM | POA: Diagnosis not present

## 2017-06-10 DIAGNOSIS — M79605 Pain in left leg: Secondary | ICD-10-CM | POA: Insufficient documentation

## 2017-06-10 HISTORY — DX: Obesity, unspecified: E66.9

## 2017-06-10 HISTORY — DX: Attention-deficit hyperactivity disorder, unspecified type: F90.9

## 2017-06-10 HISTORY — DX: Bipolar disorder, unspecified: F31.9

## 2017-06-10 MED ORDER — OXYCODONE-ACETAMINOPHEN 5-325 MG PO TABS
1.0000 | ORAL_TABLET | ORAL | Status: DC | PRN
  Administered 2017-06-10: 1 via ORAL

## 2017-06-10 MED ORDER — OXYCODONE-ACETAMINOPHEN 5-325 MG PO TABS
ORAL_TABLET | ORAL | Status: AC
  Filled 2017-06-10: qty 1

## 2017-06-10 NOTE — ED Triage Notes (Signed)
Pt reports bilateral leg pain ongoing x48 hours similar to neuropathy pain in the past. Reprots burning sensation to bilateral legs from ankles to knees. Denies injury. Tramadol not effective for pain. Unable to sleep d/t pain

## 2017-06-10 NOTE — ED Notes (Signed)
Pt given narcotic pain medicine in triage. Advised of side effects and instructed to avoid driving for a minimum of four hours.  

## 2017-06-10 NOTE — ED Notes (Signed)
No answer for VS 

## 2017-06-10 NOTE — ED Notes (Signed)
Pt called for room x2. No answer.  

## 2017-06-14 ENCOUNTER — Emergency Department (HOSPITAL_COMMUNITY)
Admission: EM | Admit: 2017-06-14 | Discharge: 2017-06-14 | Disposition: A | Discharge status: Home / Self Care | Payer: Medicare Other | Attending: Emergency Medicine | Admitting: Emergency Medicine

## 2017-06-14 ENCOUNTER — Encounter (HOSPITAL_COMMUNITY): Payer: Self-pay | Admitting: Emergency Medicine

## 2017-06-14 DIAGNOSIS — F17210 Nicotine dependence, cigarettes, uncomplicated: Secondary | ICD-10-CM | POA: Insufficient documentation

## 2017-06-14 DIAGNOSIS — F909 Attention-deficit hyperactivity disorder, unspecified type: Secondary | ICD-10-CM | POA: Diagnosis not present

## 2017-06-14 DIAGNOSIS — F1123 Opioid dependence with withdrawal: Secondary | ICD-10-CM | POA: Diagnosis not present

## 2017-06-14 DIAGNOSIS — E119 Type 2 diabetes mellitus without complications: Secondary | ICD-10-CM | POA: Diagnosis not present

## 2017-06-14 DIAGNOSIS — Z7984 Long term (current) use of oral hypoglycemic drugs: Secondary | ICD-10-CM | POA: Insufficient documentation

## 2017-06-14 DIAGNOSIS — F1193 Opioid use, unspecified with withdrawal: Secondary | ICD-10-CM

## 2017-06-14 DIAGNOSIS — I10 Essential (primary) hypertension: Secondary | ICD-10-CM | POA: Diagnosis not present

## 2017-06-14 DIAGNOSIS — Z79899 Other long term (current) drug therapy: Secondary | ICD-10-CM | POA: Diagnosis not present

## 2017-06-14 DIAGNOSIS — R197 Diarrhea, unspecified: Secondary | ICD-10-CM | POA: Diagnosis present

## 2017-06-14 LAB — COMPREHENSIVE METABOLIC PANEL
ALK PHOS: 44 U/L (ref 38–126)
ALT: 21 U/L (ref 17–63)
AST: 21 U/L (ref 15–41)
Albumin: 3.7 g/dL (ref 3.5–5.0)
Anion gap: 8 (ref 5–15)
BUN: 17 mg/dL (ref 6–20)
CHLORIDE: 105 mmol/L (ref 101–111)
CO2: 25 mmol/L (ref 22–32)
Calcium: 8.8 mg/dL — ABNORMAL LOW (ref 8.9–10.3)
Creatinine, Ser: 0.66 mg/dL (ref 0.61–1.24)
GFR calc Af Amer: >60 mL/min (ref >60)
GFR calc non Af Amer: >60 mL/min (ref >60)
GLUCOSE: 93 mg/dL (ref 65–99)
Potassium: 3.9 mmol/L (ref 3.5–5.1)
SODIUM: 138 mmol/L (ref 135–145)
Total Bilirubin: 0.7 mg/dL (ref 0.3–1.2)
Total Protein: 6.5 g/dL (ref 6.5–8.1)

## 2017-06-14 LAB — CBC
HEMATOCRIT: 45.7 % (ref 39.0–52.0)
Hemoglobin: 16 g/dL (ref 13.0–17.0)
MCH: 29.3 pg (ref 26.0–34.0)
MCHC: 35 g/dL (ref 30.0–36.0)
MCV: 83.7 fL (ref 78.0–100.0)
Platelets: 117 10*3/uL — ABNORMAL LOW (ref 150–400)
RBC: 5.46 MIL/uL (ref 4.22–5.81)
RDW: 13.6 % (ref 11.5–15.5)
WBC: 10.5 10*3/uL (ref 4.0–10.5)

## 2017-06-14 LAB — ETHANOL: Alcohol, Ethyl (B): <5 mg/dL (ref <5)

## 2017-06-14 LAB — SALICYLATE LEVEL: Salicylate Lvl: <7 mg/dL (ref 2.8–30.0)

## 2017-06-14 LAB — ACETAMINOPHEN LEVEL

## 2017-06-14 MED ORDER — DICYCLOMINE HCL 20 MG PO TABS: 20.0000 mg | ORAL_TABLET | Freq: Three times a day (TID) | ORAL | 0 refills | Status: DC | PRN

## 2017-06-14 MED ORDER — LOPERAMIDE HCL 2 MG PO CAPS: 2.0000 mg | ORAL_CAPSULE | Freq: Four times a day (QID) | ORAL | 0 refills | Status: DC | PRN

## 2017-06-14 MED ORDER — ONDANSETRON 4 MG PO TBDP: 4.0000 mg | ORAL_TABLET | Freq: Three times a day (TID) | ORAL | 0 refills | Status: DC | PRN

## 2017-06-14 NOTE — Discharge Instructions (Signed)
Trying keep yourself hydrated.

## 2017-06-14 NOTE — ED Triage Notes (Signed)
Patient is feeling crazy. Patient asked did he want to hurt hisself. Patient stated he did not what he wanted to do. Patient states that he was on pain medication and is coming off of. He also states he has stressors in his life that is making him feel this way.

## 2017-06-14 NOTE — ED Notes (Signed)
ED Provider at bedside. 

## 2017-06-14 NOTE — ED Notes (Signed)
Bed: WTR5 Expected date:  Expected time:  Means of arrival:  Comments: 

## 2017-06-14 NOTE — ED Provider Notes (Signed)
[facility] DEPT Provider Note   CSN: [id number] Arrival date & time: 06/14/17  0735     History   Chief Complaint Chief Complaint  Patient presents with  . Medical Clearance    HPI Bradley [name] is a 36 y.o. male.  HPI Patient presents with diarrhea. States he is coming off his tramadol. He is on it for neuropathy. States he's been off it for several days now because he could not get a refill. States that since then he has had diarrhea nausea and feeling bad. Had told staff that he felt a little "crazy". He states that he called behavioral health and was told to come over to [facility]. For me he does not suicide or homicidal thoughts. States she just feels kind of bad all over due to withdrawal. History of bipolar disorder ADHD and diabetes. States he is having around 8 bowel movements a day. Slight diffuse abdominal pain. No lightheadedness or dizziness. Past Medical History:  Diagnosis Date  . ADHD (attention deficit hyperactivity disorder)   . Bipolar disorder ([place])   . Diabetes mellitus without complication ([place])   . Morbid obesity ([place])   . Panic attack   . Varicose veins of both lower extremities with pain     Patient Active Problem List   Diagnosis Date Noted  . Chronic pain syndrome 05/20/2017  . Bipolar disorder, current episode mixed, moderate ([place]) 03/04/2017  . Varicose veins of both lower extremities with pain 02/18/2017  . Chronic deep vein thrombosis (DVT) of proximal vein of left lower extremity ([place]) 11/12/2016  . DVT (deep venous thrombosis), left 06/19/2016  . Smoking addiction 06/19/2016  . HTN (hypertension) 07/27/2013  . Morbid obesity ([place]) 07/27/2013  . Bipolar 1 disorder, mixed, moderate ([place]) 07/27/2013  . DM2 (diabetes mellitus, type 2) ([place]) 04/26/2013    Past Surgical History:  Procedure Laterality Date  . surgery on meatus as a child         Home Medications    Prior to Admission medications   Medication Sig Start Date End  Date Taking? Authorizing Provider  glipiZIDE (GLUCOTROL) 5 MG tablet Take 1 tablet (5 mg total) by mouth 2 (two) times daily before a meal. 05/20/17  Yes Jegede, Olugbemiga E, MD  lurasidone (LATUDA) 40 MG TABS tablet Take 1 tablet (40 mg total) by mouth daily with breakfast. 05/20/17  Yes Jegede, Olugbemiga E, MD  metFORMIN (GLUCOPHAGE-XR) 500 MG 24 hr tablet TAKE 2 TABLETS WITH BREAKFAST 05/20/17  Yes Jegede, Olugbemiga E, MD  naproxen sodium (ANAPROX) 220 MG tablet Take 440 mg by mouth 2 (two) times daily with a meal.   Yes [provider]  traMADol (ULTRAM) 50 MG tablet Take 1 tablet (50 mg total) by mouth every 8 (eight) hours as needed. 05/28/17  Yes Tresa Garter, MD  dicyclomine (BENTYL) 20 MG tablet Take 1 tablet (20 mg total) by mouth 3 (three) times daily as needed for spasms. 06/14/17   Davonna Belling, MD  glucose monitoring kit (FREESTYLE) monitoring kit 1 each by Does not apply route 4 (four) times daily - after meals and at bedtime. 1 month Diabetic Testing Supplies for QAC-QHS accuchecks. 07/27/13   Thurnell Lose, MD  loperamide (IMODIUM) 2 MG capsule Take 1 capsule (2 mg total) by mouth 4 (four) times daily as needed for diarrhea or loose stools. 06/14/17   Davonna Belling, MD  ondansetron (ZOFRAN-ODT) 4 MG disintegrating tablet Take 1 tablet (4 mg total) by mouth every 8 (eight) hours  as needed for nausea or vomiting. 06/14/17   Davonna Belling, MD    Family History History reviewed. No pertinent family history.  Social History Social History  Substance Use Topics  . Smoking status: Current Every Day Smoker    Packs/day: 1.00    Types: Cigarettes  . Smokeless tobacco: Never Used  . Alcohol use Yes     Comment: Rarely     Allergies   Patient has no known allergies.   Review of Systems Review of Systems  Constitutional: Positive for appetite change and chills. Negative for fever.  HENT: Negative for congestion.   Respiratory: Negative for  shortness of breath.   Cardiovascular: Negative for chest pain.  Gastrointestinal: Positive for abdominal pain, diarrhea and nausea.  Genitourinary: Negative for flank pain.  Musculoskeletal: Negative for back pain.  Neurological: Negative for syncope.  Psychiatric/Behavioral: Negative for confusion and suicidal ideas. The patient is not nervous/anxious.      Physical Exam Updated Vital Signs BP 122/82 (BP Location: Right Arm)   Pulse 93   Temp 97.6 F (36.4 C) (Oral)   Resp 18   Ht 6' (1.829 m)   Wt (!) 138.8 kg (306 lb)   SpO2 100%   BMI 41.50 kg/m   Physical Exam  Constitutional: He appears well-developed.  HENT:  Head: Atraumatic.  Eyes: Pupils are equal, round, and reactive to light.  Cardiovascular: Normal rate.   Pulmonary/Chest: Effort normal.  Abdominal: There is tenderness.  Mild diffuse tenderness without rebound or guarding.  Musculoskeletal: Normal range of motion.  Neurological: He is alert.  Skin: Skin is warm. Capillary refill takes less than 2 seconds.     ED Treatments / Results  Labs (all labs ordered are listed, but only abnormal results are displayed) Labs Reviewed  COMPREHENSIVE METABOLIC PANEL - Abnormal; Notable for the following:       Result Value   Calcium 8.8 (*)    All other components within normal limits  ACETAMINOPHEN LEVEL - Abnormal; Notable for the following:    Acetaminophen (Tylenol), Serum <10 (*)    All other components within normal limits  CBC - Abnormal; Notable for the following:    Platelets 117 (*)    All other components within normal limits  ETHANOL  SALICYLATE LEVEL  RAPID URINE DRUG SCREEN, HOSP PERFORMED    EKG  EKG Interpretation None       Radiology No results found.  Procedures Procedures (including critical care time)  Medications Ordered in ED Medications - No data to display   Initial Impression / Assessment and Plan / ED Course  I have reviewed the triage vital signs and the nursing  notes.  Pertinent labs & imaging results that were available during my care of the patient were reviewed by me and considered in my medical decision making (see chart for details).     Patient with opioid withdrawal also some diarrhea and abdominal pain. Does not appear to be in a psychiatric crisis time. We'll give some symptomatic treatment and discharge home. Is not appear to need inpatient psychiatric treatment at this time.  Final Clinical Impressions(s) / ED Diagnoses   Final diagnoses:  Opioid withdrawal (HCC)    New Prescriptions New Prescriptions   DICYCLOMINE (BENTYL) 20 MG TABLET    Take 1 tablet (20 mg total) by mouth 3 (three) times daily as needed for spasms.   LOPERAMIDE (IMODIUM) 2 MG CAPSULE    Take 1 capsule (2 mg total) by mouth 4 (four) times  daily as needed for diarrhea or loose stools.   ONDANSETRON (ZOFRAN-ODT) 4 MG DISINTEGRATING TABLET    Take 1 tablet (4 mg total) by mouth every 8 (eight) hours as needed for nausea or vomiting.     Davonna Belling, MD 06/14/17 1005

## 2017-06-16 ENCOUNTER — Telehealth: Payer: Self-pay | Admitting: Internal Medicine

## 2017-06-16 MED FILL — traMADol HCL 50 MG TABS: 50 | 20 days supply | Qty: 60 | Fill #0

## 2017-06-16 MED FILL — glipiZIDE 5 MG TABS: 5 | 30 days supply | Qty: 60 | Fill #0

## 2017-06-16 MED FILL — METFORMIN HCL ER 500 MG TAB: 500 | 30 days supply | Qty: 60 | Fill #0

## 2017-06-16 NOTE — Telephone Encounter (Signed)
Pt. Returned nurse call. Please f/u with pt.  °

## 2017-06-16 NOTE — Telephone Encounter (Signed)
Patient verified DOB Patient states he located the tramadol prescription in his significant others overnight bag. Patient used his last pill last Friday and was seen in the ED on 06/14/17. Patient is aware of prescription being placed at the front desk for pickup.  Patient las filled prescription on 06/20/17 No further questions at this time.

## 2017-06-16 NOTE — Telephone Encounter (Signed)
Patient called requesting to speak to nurse please f/up

## 2017-06-16 NOTE — Telephone Encounter (Signed)
Medical Assistant left message on patient's home and cell voicemail. Voicemail states to give a call back to Nubia with [facility] at [phone].  

## 2017-06-19 DIAGNOSIS — F3132 Bipolar disorder, current episode depressed, moderate: Secondary | ICD-10-CM | POA: Diagnosis not present

## 2017-06-23 ENCOUNTER — Encounter (HOSPITAL_COMMUNITY): Payer: Self-pay | Admitting: Emergency Medicine

## 2017-07-13 ENCOUNTER — Telehealth: Payer: Self-pay | Admitting: Internal Medicine

## 2017-07-13 DIAGNOSIS — G894 Chronic pain syndrome: Secondary | ICD-10-CM

## 2017-07-13 NOTE — Telephone Encounter (Signed)
Pt called requesting medication refill on traMADol (ULTRAM) 50 MG tablet °

## 2017-07-14 MED ORDER — TRAMADOL HCL 50 MG PO TABS: 50.0000 mg | ORAL_TABLET | Freq: Three times a day (TID) | ORAL | 0 refills | Status: DC | PRN

## 2017-07-14 NOTE — Telephone Encounter (Signed)
Pt called back to check on the statu of the med Tramadol , please call him back when got the prescription

## 2017-07-14 NOTE — Telephone Encounter (Signed)
Refilled

## 2017-07-15 MED FILL — traMADol HCL 50 MG TABS: 50 | 20 days supply | Qty: 60 | Fill #0

## 2017-07-27 ENCOUNTER — Ambulatory Visit (HOSPITAL_COMMUNITY)
Admission: EM | Admit: 2017-07-27 | Discharge: 2017-07-27 | Disposition: A | Discharge status: Home / Self Care | Payer: Medicare Other | Attending: Emergency Medicine | Admitting: Emergency Medicine

## 2017-07-27 ENCOUNTER — Encounter (HOSPITAL_COMMUNITY): Payer: Self-pay | Admitting: Emergency Medicine

## 2017-07-27 DIAGNOSIS — F1123 Opioid dependence with withdrawal: Secondary | ICD-10-CM | POA: Diagnosis not present

## 2017-07-27 DIAGNOSIS — F1193 Opioid use, unspecified with withdrawal: Secondary | ICD-10-CM

## 2017-07-27 MED ORDER — ONDANSETRON 4 MG PO TBDP: 4.0000 mg | ORAL_TABLET | Freq: Three times a day (TID) | ORAL | 0 refills | Status: DC | PRN

## 2017-07-27 MED ORDER — DICYCLOMINE HCL 20 MG PO TABS: 20.0000 mg | ORAL_TABLET | Freq: Three times a day (TID) | ORAL | 0 refills | Status: DC | PRN

## 2017-07-27 NOTE — ED Triage Notes (Signed)
Pt sts withdrawal sx since running out of tramadol 12 hours ago; pt sts takes for neuropathy

## 2017-07-27 NOTE — ED Provider Notes (Signed)
[facility]    CSN: [id number] Arrival date & time: 07/27/17  1728     History   Chief Complaint Chief Complaint  Patient presents with  . Withdrawal    HPI Bradley [name] is a 36 y.o. male.   36 year-old male, presenting today complaining of opiate withdrawal. Patient states that he takes chronic tramadol for diabetic neuropathy. Prescription filled 10/17 and supposed to last for 20 days. He ran out yesterday. Complaining of abdominal cramping, nausea and vomiting. History of same. He admits to taking extra tramadol than how they are prescribed    The history is provided by the patient.  Drug Problem  This is a recurrent problem. The current episode started 12 to 24 hours ago. The problem occurs constantly. The problem has not changed since onset.Associated symptoms include abdominal pain. Pertinent negatives include no chest pain, no headaches and no shortness of breath. Nothing aggravates the symptoms. Nothing relieves the symptoms. He has tried nothing for the symptoms. The treatment provided no relief.    Past Medical History:  Diagnosis Date  . ADHD   . ADHD (attention deficit hyperactivity disorder)   . Bipolar 1 disorder (HCC)   . Bipolar disorder (HCC)   . Diabetes mellitus without complication (HCC)   . Morbid obesity (HCC)   . Obesity   . Panic attack   . Varicose veins of both lower extremities with pain     Patient Active Problem List   Diagnosis Date Noted  . Chronic pain syndrome 05/20/2017  . Bipolar disorder, current episode mixed, moderate (HCC) 03/04/2017  . Varicose veins of both lower extremities with pain 02/18/2017  . Chronic deep vein thrombosis (DVT) of proximal vein of left lower extremity (HCC) 11/12/2016  . DVT (deep venous thrombosis), left 06/19/2016  . Smoking addiction 06/19/2016  . HTN (hypertension) 07/27/2013  . Morbid obesity (HCC) 07/27/2013  . Bipolar 1 disorder, mixed, moderate (HCC) 07/27/2013  . DM2 (diabetes  mellitus, type 2) (HCC) 04/26/2013    Past Surgical History:  Procedure Laterality Date  . surgery on meatus as a child         Home Medications    Prior to Admission medications   Medication Sig Start Date End Date Taking? Authorizing Provider  dicyclomine (BENTYL) 20 MG tablet Take 1 tablet (20 mg total) by mouth 3 (three) times daily as needed for spasms. 07/27/17   Abilene Mcphee C, PA-C  glipiZIDE (GLUCOTROL) 5 MG tablet Take 1 tablet (5 mg total) by mouth 2 (two) times daily before a meal. 05/20/17   Jegede, Olugbemiga E, MD  glucose monitoring kit (FREESTYLE) monitoring kit 1 each by Does not apply route 4 (four) times daily - after meals and at bedtime. 1 month Diabetic Testing Supplies for QAC-QHS accuchecks. 07/27/13   Leroy Sea, MD  loperamide (IMODIUM) 2 MG capsule Take 1 capsule (2 mg total) by mouth 4 (four) times daily as needed for diarrhea or loose stools. 06/14/17   Benjiman Core, MD  lurasidone (LATUDA) 40 MG TABS tablet Take 1 tablet (40 mg total) by mouth daily with breakfast. 05/20/17   Quentin Angst, MD  metFORMIN (GLUCOPHAGE-XR) 500 MG 24 hr tablet TAKE 2 TABLETS WITH BREAKFAST 05/20/17   Quentin Angst, MD  naproxen sodium (ANAPROX) 220 MG tablet Take 440 mg by mouth 2 (two) times daily with a meal.    [provider]  ondansetron (ZOFRAN-ODT) 4 MG disintegrating tablet Take 1 tablet (4 mg total) by  mouth every 8 (eight) hours as needed for nausea or vomiting. 07/27/17   Janaiyah Blackard C, PA-C  traMADol (ULTRAM) 50 MG tablet Take 1 tablet (50 mg total) by mouth every 8 (eight) hours as needed. 07/14/17   Arnoldo Morale, MD    Family History History reviewed. No pertinent family history.  Social History Social History  Substance Use Topics  . Smoking status: Current Every Day Smoker    Packs/day: 1.00    Types: Cigarettes  . Smokeless tobacco: Never Used  . Alcohol use Yes     Comment: Rarely     Allergies   Patient has no  known allergies.   Review of Systems Review of Systems  Constitutional: Negative for chills and fever.  HENT: Negative for ear pain and sore throat.   Eyes: Negative for pain and visual disturbance.  Respiratory: Negative for cough and shortness of breath.   Cardiovascular: Negative for chest pain and palpitations.  Gastrointestinal: Positive for abdominal pain, nausea and vomiting.  Genitourinary: Negative for dysuria and hematuria.  Musculoskeletal: Negative for arthralgias and back pain.  Skin: Negative for color change and rash.  Neurological: Negative for seizures, syncope and headaches.  All other systems reviewed and are negative.    Physical Exam Triage Vital Signs ED Triage Vitals [07/27/17 1810]  Enc Vitals Group     BP 139/83     Pulse Rate 87     Resp 18     Temp 98.2 F (36.8 C)     Temp Source Oral     SpO2 100 %     Weight      Height      Head Circumference      Peak Flow      Pain Score 6     Pain Loc      Pain Edu?      Excl. in [place]?    No data found.   Updated Vital Signs BP 139/83 (BP Location: Right Arm)   Pulse 87   Temp 98.2 F (36.8 C) (Oral)   Resp 18   SpO2 100%   Visual Acuity Right Eye Distance:   Left Eye Distance:   Bilateral Distance:    Right Eye Near:   Left Eye Near:    Bilateral Near:     Physical Exam  Constitutional: He appears well-developed and well-nourished.  HENT:  Head: Normocephalic and atraumatic.  Eyes: Conjunctivae are normal.  Neck: Neck supple.  Cardiovascular: Normal rate and regular rhythm.   No murmur heard. Pulmonary/Chest: Effort normal and breath sounds normal. No respiratory distress.  Abdominal: Soft. There is no tenderness.  Musculoskeletal: He exhibits no edema.  Neurological: He is alert.  Skin: Skin is warm and dry.  Psychiatric: He has a normal mood and affect.  Nursing note and vitals reviewed.    UC Treatments / Results  Labs (all labs ordered are listed, but only abnormal  results are displayed) Labs Reviewed - No data to display  EKG  EKG Interpretation None       Radiology No results found.  Procedures Procedures (including critical care time)  Medications Ordered in UC Medications - No data to display   Initial Impression / Assessment and Plan / UC Course  I have reviewed the triage vital signs and the nursing notes.  Pertinent labs & imaging results that were available during my care of the patient were reviewed by me and considered in my medical decision making (see chart for details).  Patient looked up in the database, received 60 tablets of tramadol on 10/17, lasted him 11 days. He is now eligible for refill until 11/17. He is complaining of abdominal cramping with nausea and vomiting.   Final Clinical Impressions(s) / UC Diagnoses   Final diagnoses:  Narcotic withdrawal [facility])    New Prescriptions Discharge Medication List as of 07/27/2017  6:49 PM       Controlled Substance Prescriptions [place] Controlled Substance Registry consulted? Yes, I have consulted the [place] Controlled Substances Registry for this patient, no additional narcotics will be prescribed   Phebe Colla, [place] 07/27/17 1946

## 2017-08-10 ENCOUNTER — Telehealth: Payer: Self-pay | Admitting: Internal Medicine

## 2017-08-10 MED FILL — METFORMIN HCL ER 500 MG TAB: 500 | 30 days supply | Qty: 60 | Fill #1

## 2017-08-10 NOTE — Telephone Encounter (Signed)
Pt called to request a refill for  °traMADol (ULTRAM) 50 MG tablet  °Please follow up °

## 2017-08-12 ENCOUNTER — Telehealth: Payer: Self-pay | Admitting: Internal Medicine

## 2017-08-12 NOTE — Telephone Encounter (Signed)
Pt called to check the status of his refill for  traMADol (ULTRAM) 50 MG tablet

## 2017-08-13 ENCOUNTER — Other Ambulatory Visit: Payer: Self-pay | Admitting: *Deleted

## 2017-08-13 DIAGNOSIS — G894 Chronic pain syndrome: Secondary | ICD-10-CM

## 2017-08-13 MED ORDER — TRAMADOL HCL 50 MG PO TABS: 50.0000 mg | ORAL_TABLET | Freq: Three times a day (TID) | ORAL | 0 refills | Status: DC | PRN

## 2017-08-13 NOTE — Telephone Encounter (Signed)
Pt called to check on the statu of the refill request for Tramadol, please follow up

## 2017-08-13 NOTE — Telephone Encounter (Signed)
Last filled on 07/10/17. Filled today for 30 day supply.

## 2017-08-14 MED FILL — glipiZIDE 5 MG TABS: 5 | 30 days supply | Qty: 60 | Fill #1

## 2017-08-14 MED FILL — traMADol HCL 50 MG TABS: 50 | 20 days supply | Qty: 60 | Fill #0

## 2017-08-17 ENCOUNTER — Other Ambulatory Visit: Payer: Self-pay | Admitting: Internal Medicine

## 2017-08-17 NOTE — Telephone Encounter (Signed)
Refilled on 08/13/2017

## 2017-08-19 ENCOUNTER — Encounter: Payer: Self-pay | Admitting: Internal Medicine

## 2017-08-19 ENCOUNTER — Ambulatory Visit: Payer: Medicare Other | Attending: Internal Medicine | Admitting: Internal Medicine

## 2017-08-19 VITALS — BP 106/70 | HR 85 | Temp 98.0°F | Resp 18 | Ht 72.0 in | Wt 310.0 lb

## 2017-08-19 DIAGNOSIS — F909 Attention-deficit hyperactivity disorder, unspecified type: Secondary | ICD-10-CM | POA: Diagnosis not present

## 2017-08-19 DIAGNOSIS — Z79899 Other long term (current) drug therapy: Secondary | ICD-10-CM | POA: Insufficient documentation

## 2017-08-19 DIAGNOSIS — E119 Type 2 diabetes mellitus without complications: Secondary | ICD-10-CM | POA: Insufficient documentation

## 2017-08-19 DIAGNOSIS — F3162 Bipolar disorder, current episode mixed, moderate: Secondary | ICD-10-CM

## 2017-08-19 DIAGNOSIS — F17200 Nicotine dependence, unspecified, uncomplicated: Secondary | ICD-10-CM | POA: Diagnosis not present

## 2017-08-19 DIAGNOSIS — Z7984 Long term (current) use of oral hypoglycemic drugs: Secondary | ICD-10-CM | POA: Diagnosis not present

## 2017-08-19 DIAGNOSIS — F410 Panic disorder [episodic paroxysmal anxiety] without agoraphobia: Secondary | ICD-10-CM | POA: Diagnosis not present

## 2017-08-19 DIAGNOSIS — I8393 Asymptomatic varicose veins of bilateral lower extremities: Secondary | ICD-10-CM | POA: Insufficient documentation

## 2017-08-19 DIAGNOSIS — F329 Major depressive disorder, single episode, unspecified: Secondary | ICD-10-CM | POA: Diagnosis not present

## 2017-08-19 DIAGNOSIS — I10 Essential (primary) hypertension: Secondary | ICD-10-CM | POA: Diagnosis not present

## 2017-08-19 LAB — GLUCOSE, POCT (MANUAL RESULT ENTRY): POC GLUCOSE: 187 mg/dL — AB (ref 70–99)

## 2017-08-19 LAB — POCT GLYCOSYLATED HEMOGLOBIN (HGB A1C): Hemoglobin A1C: 6.2

## 2017-08-19 NOTE — Progress Notes (Signed)
62

## 2017-08-19 NOTE — Progress Notes (Signed)
Bradley [name], is a 36 y.o. male  JIR:[id number]  BPZ:[id number]  DOB - [birth year]  Chief Complaint  Patient presents with  . Diabetes      Subjective:   Bradley [name] is a 36 y.o. male with history of Hypertension, type 2 diabetes mellitus, ADHD, Bipolar Disorder and major depressive disorder here today for a follow up visit. He was seen recently in the ED for narcotic withdrawal because he used all his prescribed tramadol and couldn't get refill on time. Today, he has no new complaint.  He follows up with Psychiatrist and behavioral therapist for his Bipolar disorder. He claims adherence with his diabetes medications. No hypoglycemic episode. Patient denies any suicidal ideation or thought. Patient has No headache, No chest pain, No abdominal pain - No Nausea, No new weakness tingling or numbness, No Cough - SOB.  No problems updated.  ALLERGIES: No Known Allergies  PAST MEDICAL HISTORY: Past Medical History:  Diagnosis Date  . ADHD   . ADHD (attention deficit hyperactivity disorder)   . Bipolar 1 disorder ([place])   . Bipolar disorder ([place])   . Diabetes mellitus without complication ([place])   . Morbid obesity ([place])   . Obesity   . Panic attack   . Varicose veins of both lower extremities with pain     MEDICATIONS AT HOME: Prior to Admission medications   Medication Sig Start Date End Date Taking? Authorizing Provider  dicyclomine (BENTYL) 20 MG tablet Take 1 tablet (20 mg total) by mouth 3 (three) times daily as needed for spasms. 07/27/17  Yes Blue, Olivia C, PA-C  glipiZIDE (GLUCOTROL) 5 MG tablet Take 1 tablet (5 mg total) by mouth 2 (two) times daily before a meal. 05/20/17  Yes [place] E, MD  glucose monitoring kit (FREESTYLE) monitoring kit 1 each by Does not apply route 4 (four) times daily - after meals and at bedtime. 1 month Diabetic Testing Supplies for QAC-QHS accuchecks. 07/27/13  Yes Thurnell Lose, MD  loperamide (IMODIUM) 2 MG capsule Take 1  capsule (2 mg total) by mouth 4 (four) times daily as needed for diarrhea or loose stools. 06/14/17  Yes Davonna Belling, MD  lurasidone (LATUDA) 40 MG TABS tablet Take 1 tablet (40 mg total) by mouth daily with breakfast. 05/20/17  Yes Cheetara Hoge E, MD  metFORMIN (GLUCOPHAGE-XR) 500 MG 24 hr tablet TAKE 2 TABLETS WITH BREAKFAST 05/20/17  Yes Jasmarie Coppock E, MD  naproxen sodium (ANAPROX) 220 MG tablet Take 440 mg by mouth 2 (two) times daily with a meal.   Yes [provider]  ondansetron (ZOFRAN-ODT) 4 MG disintegrating tablet Take 1 tablet (4 mg total) by mouth every 8 (eight) hours as needed for nausea or vomiting. 07/27/17  Yes Blue, Olivia C, PA-C  traMADol (ULTRAM) 50 MG tablet Take 1 tablet (50 mg total) every 8 (eight) hours as needed by mouth. 08/13/17  Yes Javonna Balli E, MD    Objective:   Vitals:   08/19/17 1347  BP: 106/70  Pulse: 85  Resp: 18  Temp: 98 F (36.7 C)  TempSrc: Oral  SpO2: 100%  Weight: (!) 310 lb (140.6 kg)  Height: 6' (1.829 m)   Exam General appearance : Awake, alert, not in any distress. Speech Clear. Not toxic looking, obese HEENT: Atraumatic and Normocephalic, pupils equally reactive to light and accomodation Neck: Supple, no JVD. No cervical lymphadenopathy.  Chest: Good air entry bilaterally, no added sounds  CVS: S1 S2 regular, no murmurs.  Abdomen: Bowel  sounds present, Non tender and not distended with no gaurding, rigidity or rebound. Extremities: B/L Lower Ext shows no edema, both legs are warm to touch Neurology: Awake alert, and oriented X 3, CN II-XII intact, Non focal Skin: No Rash  Data Review Lab Results  Component Value Date   HGBA1C 6.2 08/19/2017   HGBA1C 7.0 (H) 05/20/2017   HGBA1C 6.9 05/20/2017    Assessment & Plan   1. Type 2 diabetes mellitus without complication, without long-term current use of insulin (HCC)  - POCT A1C - Glucose (CBG)  Aim for 30 minutes of exercise most days. Rethink  what you drink. Water is great! Aim for 2-3 Carb Choices per meal (30-45 grams) +/- 1 either way  Aim for 0-15 Carbs per snack if hungry  Include protein in moderation with your meals and snacks  Consider reading food labels for Total Carbohydrate and Fat Grams of foods  Consider checking BG at alternate times per day  Continue taking medication as directed Be mindful about how much sugar you are adding to beverages and other foods. Fruit Punch - find one with no sugar  Measure and decrease portions of carbohydrate foods  Make your plate and don't go back for seconds  2. Smoking addiction  Bradley [name] was counseled on the dangers of tobacco use, and was advised to quit. Reviewed strategies to maximize success, including removing cigarettes and smoking materials from environment, stress management and support of family/friends.  3. Bipolar disorder, current episode mixed, moderate ([place])  - Please continue follow up with Psychiatrist and Behavioral Therapist  Patient have been counseled extensively about nutrition and exercise. Other issues discussed during this visit include: low cholesterol diet, weight control and daily exercise, foot care, annual eye examinations at Ophthalmology, importance of adherence with medications and regular follow-up. We also discussed long term complications of uncontrolled diabetes.  Return in about 3 months (around 11/19/2017) for Hemoglobin A1C and Follow up, DM, Follow up Pain and comorbidities.  The patient was given clear instructions to go to ER or return to medical center if symptoms don't improve, worsen or new problems develop. The patient verbalized understanding. The patient was told to call to get lab results if they haven't heard anything in the next week.   This note has been created with Surveyor, quantity. Any transcriptional errors are unintentional.    Angelica Chessman, MD, [place], Karilyn Cota, [place] and [place], [place]   08/19/2017, 2:15 PM

## 2017-08-19 NOTE — Patient Instructions (Signed)
Steps to Quit Smoking Smoking tobacco can be bad for your health. It can also affect almost every organ in your body. Smoking puts you and people around you at risk for many serious long-lasting (chronic) diseases. Quitting smoking is hard, but it is one of the best things that you can do for your health. It is never too late to quit. What are the benefits of quitting smoking? When you quit smoking, you lower your risk for getting serious diseases and conditions. They can include:  Lung cancer or lung disease.  Heart disease.  Stroke.  Heart attack.  Not being able to have children (infertility).  Weak bones (osteoporosis) and broken bones (fractures).  If you have coughing, wheezing, and shortness of breath, those symptoms may get better when you quit. You may also get sick less often. If you are pregnant, quitting smoking can help to lower your chances of having a baby of low birth weight. What can I do to help me quit smoking? Talk with your doctor about what can help you quit smoking. Some things you can do (strategies) include:  Quitting smoking totally, instead of slowly cutting back how much you smoke over a period of time.  Going to in-person counseling. You are more likely to quit if you go to many counseling sessions.  Using resources and support systems, such as: ? Online chats with a counselor. ? Phone quitlines. ? Printed self-help materials. ? Support groups or group counseling. ? Text messaging programs. ? Mobile phone apps or applications.  Taking medicines. Some of these medicines may have nicotine in them. If you are pregnant or breastfeeding, do not take any medicines to quit smoking unless your doctor says it is okay. Talk with your doctor about counseling or other things that can help you.  Talk with your doctor about using more than one strategy at the same time, such as taking medicines while you are also going to in-person counseling. This can help make  quitting easier. What things can I do to make it easier to quit? Quitting smoking might feel very hard at first, but there is a lot that you can do to make it easier. Take these steps:  Talk to your family and friends. Ask them to support and encourage you.  Call phone quitlines, reach out to support groups, or work with a counselor.  Ask people who smoke to not smoke around you.  Avoid places that make you want (trigger) to smoke, such as: ? Bars. ? Parties. ? Smoke-break areas at work.  Spend time with people who do not smoke.  Lower the stress in your life. Stress can make you want to smoke. Try these things to help your stress: ? Getting regular exercise. ? Deep-breathing exercises. ? Yoga. ? Meditating. ? Doing a body scan. To do this, close your eyes, focus on one area of your body at a time from head to toe, and notice which parts of your body are tense. Try to relax the muscles in those areas.  Download or buy apps on your mobile phone or tablet that can help you stick to your quit plan. There are many free apps, such as QuitGuide from the CDC (Centers for Disease Control and Prevention). You can find more support from smokefree.gov and other websites.  This information is not intended to replace advice given to you by your health care provider. Make sure you discuss any questions you have with your health care provider. Document Released: 07/12/2009 Document   Revised: 05/13/2016 Document Reviewed: 01/30/2015 Elsevier Interactive Patient Education  2018 [employer] Diabetes Mellitus and Exercise Exercising regularly is important for your overall health, especially when you have diabetes (diabetes mellitus). Exercising is not only about losing weight. It has many health benefits, such as increasing muscle strength and bone density and reducing body fat and stress. This leads to improved fitness, flexibility, and endurance, all of which result in better overall health. Exercise  has additional benefits for people with diabetes, including:  Reducing appetite.  Helping to lower and control blood glucose.  Lowering blood pressure.  Helping to control amounts of fatty substances (lipids) in the blood, such as cholesterol and triglycerides.  Helping the body to respond better to insulin (improving insulin sensitivity).  Reducing how much insulin the body needs.  Decreasing the risk for heart disease by: ? Lowering cholesterol and triglyceride levels. ? Increasing the levels of good cholesterol. ? Lowering blood glucose levels.  What is my activity plan? Your health care provider or certified diabetes educator can help you make a plan for the type and frequency of exercise (activity plan) that works for you. Make sure that you:  Do at least 150 minutes of moderate-intensity or vigorous-intensity exercise each week. This could be brisk walking, biking, or water aerobics. ? Do stretching and strength exercises, such as yoga or weightlifting, at least 2 times a week. ? Spread out your activity over at least 3 days of the week.  Get some form of physical activity every day. ? Do not go more than 2 days in a row without some kind of physical activity. ? Avoid being inactive for more than 90 minutes at a time. Take frequent breaks to walk or stretch.  Choose a type of exercise or activity that you enjoy, and set realistic goals.  Start slowly, and gradually increase the intensity of your exercise over time.  What do I need to know about managing my diabetes?  Check your blood glucose before and after exercising. ? If your blood glucose is higher than 240 mg/dL (47.813.3 mmol/L) before you exercise, check your urine for ketones. If you have ketones in your urine, do not exercise until your blood glucose returns to normal.  Know the symptoms of low blood glucose (hypoglycemia) and how to treat it. Your risk for hypoglycemia increases during and after exercise. Common  symptoms of hypoglycemia can include: ? Hunger. ? Anxiety. ? Sweating and feeling clammy. ? Confusion. ? Dizziness or feeling light-headed. ? Increased heart rate or palpitations. ? Blurry vision. ? Tingling or numbness around the mouth, lips, or tongue. ? Tremors or shakes. ? Irritability.  Keep a rapid-acting carbohydrate snack available before, during, and after exercise to help prevent or treat hypoglycemia.  Avoid injecting insulin into areas of the body that are going to be exercised. For example, avoid injecting insulin into: ? The arms, when playing tennis. ? The legs, when jogging.  Keep records of your exercise habits. Doing this can help you and your health care provider adjust your diabetes management plan as needed. Write down: ? Food that you eat before and after you exercise. ? Blood glucose levels before and after you exercise. ? The type and amount of exercise you have done. ? When your insulin is expected to peak, if you use insulin. Avoid exercising at times when your insulin is peaking.  When you start a new exercise or activity, work with your health care provider to make sure the activity is safe  for you, and to adjust your insulin, medicines, or food intake as needed.  Drink plenty of water while you exercise to prevent dehydration or heat stroke. Drink enough fluid to keep your urine clear or pale yellow. This information is not intended to replace advice given to you by your health care provider. Make sure you discuss any questions you have with your health care provider. Document Released: 12/06/2003 Document Revised: 04/04/2016 Document Reviewed: 02/25/2016 Elsevier Interactive Patient Education  2018 [employer]

## 2017-09-09 ENCOUNTER — Telehealth: Payer: Self-pay | Admitting: Internal Medicine

## 2017-09-09 NOTE — Telephone Encounter (Signed)
Pt called to request a refill for  °traMADol (ULTRAM) 50 MG tablet  °Please follow up °

## 2017-09-10 ENCOUNTER — Telehealth: Payer: Self-pay | Admitting: Internal Medicine

## 2017-09-10 NOTE — Telephone Encounter (Signed)
Pt called to request an update on his -traMADol (ULTRAM) 50 MG tablet  (his pharmacy preference is [facility]) Please follow up

## 2017-09-10 NOTE — Telephone Encounter (Signed)
Refill for tramadol. Last filled 08/13/17

## 2017-09-11 ENCOUNTER — Telehealth: Payer: Self-pay | Admitting: Internal Medicine

## 2017-09-11 ENCOUNTER — Other Ambulatory Visit: Payer: Self-pay | Admitting: Internal Medicine

## 2017-09-11 DIAGNOSIS — G894 Chronic pain syndrome: Secondary | ICD-10-CM

## 2017-09-11 MED ORDER — TRAMADOL HCL 50 MG PO TABS: 50.0000 mg | ORAL_TABLET | Freq: Three times a day (TID) | ORAL | 0 refills | Status: DC | PRN

## 2017-09-11 MED FILL — traMADol HCL 50 MG TABS: 50 | 20 days supply | Qty: 60 | Fill #0

## 2017-09-11 NOTE — Telephone Encounter (Signed)
[facility]. Thank you.

## 2017-09-11 NOTE — Telephone Encounter (Signed)
Pt called back to check on the statu of his refill since the 15th is Saturday and [facility], can you please auth the refill for tramadol

## 2017-09-11 NOTE — Telephone Encounter (Signed)
[facility]

## 2017-09-11 NOTE — Telephone Encounter (Signed)
Patient called asking for a refill on tramadol please fu with patient

## 2017-09-11 NOTE — Telephone Encounter (Signed)
Refilled to [facility]

## 2017-09-14 MED FILL — METFORMIN HCL ER 500 MG TAB: 500 | 30 days supply | Qty: 60 | Fill #2

## 2017-09-14 NOTE — Telephone Encounter (Signed)
I called Pt to informed him of the medication has been refill

## 2017-10-07 ENCOUNTER — Telehealth: Payer: Self-pay | Admitting: Internal Medicine

## 2017-10-07 NOTE — Telephone Encounter (Signed)
Pt call requesting speak with the nurse about possible early refill for tramadol since he got it on the 14 of the month an he out of medication and he is in a lot of pain, please call him back

## 2017-10-07 NOTE — Telephone Encounter (Signed)
Pt was informed an made an appt to speak with the provider to possible increase the pill

## 2017-10-07 NOTE — Telephone Encounter (Signed)
PER POLICY AND CONTROLLED SUBSTANCE AGREEMENT. Early refills are against our policy.  Please advise if this request is appropriate

## 2017-10-12 NOTE — Telephone Encounter (Signed)
Patient called asking for a medication refill on the tramadol. Please fu

## 2017-10-13 ENCOUNTER — Encounter (HOSPITAL_COMMUNITY): Payer: Self-pay | Admitting: *Deleted

## 2017-10-13 ENCOUNTER — Emergency Department (HOSPITAL_COMMUNITY): Payer: Medicare Other

## 2017-10-13 ENCOUNTER — Emergency Department (HOSPITAL_COMMUNITY)
Admission: EM | Admit: 2017-10-13 | Discharge: 2017-10-13 | Disposition: A | Discharge status: Home / Self Care | Payer: Medicare Other | Attending: Emergency Medicine | Admitting: Emergency Medicine

## 2017-10-13 ENCOUNTER — Other Ambulatory Visit: Payer: Self-pay

## 2017-10-13 DIAGNOSIS — F17210 Nicotine dependence, cigarettes, uncomplicated: Secondary | ICD-10-CM | POA: Diagnosis not present

## 2017-10-13 DIAGNOSIS — R1011 Right upper quadrant pain: Secondary | ICD-10-CM | POA: Insufficient documentation

## 2017-10-13 DIAGNOSIS — R109 Unspecified abdominal pain: Secondary | ICD-10-CM | POA: Diagnosis not present

## 2017-10-13 DIAGNOSIS — M545 Low back pain, unspecified: Secondary | ICD-10-CM

## 2017-10-13 DIAGNOSIS — R197 Diarrhea, unspecified: Secondary | ICD-10-CM | POA: Diagnosis not present

## 2017-10-13 DIAGNOSIS — Z79899 Other long term (current) drug therapy: Secondary | ICD-10-CM | POA: Diagnosis not present

## 2017-10-13 DIAGNOSIS — Z7984 Long term (current) use of oral hypoglycemic drugs: Secondary | ICD-10-CM | POA: Diagnosis not present

## 2017-10-13 DIAGNOSIS — I10 Essential (primary) hypertension: Secondary | ICD-10-CM | POA: Diagnosis not present

## 2017-10-13 DIAGNOSIS — R1110 Vomiting, unspecified: Secondary | ICD-10-CM | POA: Diagnosis not present

## 2017-10-13 DIAGNOSIS — E119 Type 2 diabetes mellitus without complications: Secondary | ICD-10-CM | POA: Insufficient documentation

## 2017-10-13 LAB — COMPREHENSIVE METABOLIC PANEL
ALBUMIN: 4 g/dL (ref 3.5–5.0)
ALT: 22 U/L (ref 17–63)
AST: 19 U/L (ref 15–41)
Alkaline Phosphatase: 55 U/L (ref 38–126)
Anion gap: 11 (ref 5–15)
BUN: 10 mg/dL (ref 6–20)
CHLORIDE: 102 mmol/L (ref 101–111)
CO2: 26 mmol/L (ref 22–32)
CREATININE: 0.87 mg/dL (ref 0.61–1.24)
Calcium: 9.3 mg/dL (ref 8.9–10.3)
GFR calc Af Amer: >60 mL/min (ref >60)
GFR calc non Af Amer: >60 mL/min (ref >60)
GLUCOSE: 115 mg/dL — AB (ref 65–99)
POTASSIUM: 4.8 mmol/L (ref 3.5–5.1)
Sodium: 139 mmol/L (ref 135–145)
Total Bilirubin: 0.9 mg/dL (ref 0.3–1.2)
Total Protein: 7.1 g/dL (ref 6.5–8.1)

## 2017-10-13 LAB — URINALYSIS, ROUTINE W REFLEX MICROSCOPIC
Bilirubin Urine: NEGATIVE
GLUCOSE, UA: NEGATIVE mg/dL
HGB URINE DIPSTICK: NEGATIVE
Ketones, ur: NEGATIVE mg/dL
Leukocytes, UA: NEGATIVE
Nitrite: NEGATIVE
PH: 5 (ref 5.0–8.0)
Protein, ur: NEGATIVE mg/dL
SPECIFIC GRAVITY, URINE: 1.019 (ref 1.005–1.030)

## 2017-10-13 LAB — CBC WITH DIFFERENTIAL/PLATELET
BASOS ABS: 0.1 10*3/uL (ref 0.0–0.1)
BASOS PCT: 0 %
EOS PCT: 2 %
Eosinophils Absolute: 0.3 10*3/uL (ref 0.0–0.7)
HEMATOCRIT: 49.8 % (ref 39.0–52.0)
Hemoglobin: 17.5 g/dL — ABNORMAL HIGH (ref 13.0–17.0)
Lymphocytes Relative: 27 %
Lymphs Abs: 3.9 10*3/uL (ref 0.7–4.0)
MCH: 29.8 pg (ref 26.0–34.0)
MCHC: 35.1 g/dL (ref 30.0–36.0)
MCV: 84.7 fL (ref 78.0–100.0)
Monocytes Absolute: 0.7 10*3/uL (ref 0.1–1.0)
Monocytes Relative: 5 %
NEUTROS ABS: 9.2 10*3/uL — AB (ref 1.7–7.7)
Neutrophils Relative %: 66 %
PLATELETS: 179 10*3/uL (ref 150–400)
RBC: 5.88 MIL/uL — AB (ref 4.22–5.81)
RDW: 13.1 % (ref 11.5–15.5)
WBC: 14.1 10*3/uL — AB (ref 4.0–10.5)

## 2017-10-13 LAB — LIPASE, BLOOD: Lipase: 52 U/L — ABNORMAL HIGH (ref 11–51)

## 2017-10-13 MED ORDER — ONDANSETRON HCL 4 MG/2ML IJ SOLN: 4.0000 mg | Freq: Once | INTRAMUSCULAR | Status: DC

## 2017-10-13 MED ORDER — OXYCODONE-ACETAMINOPHEN 5-325 MG PO TABS
1.0000 | ORAL_TABLET | Freq: Once | ORAL | Status: AC
  Administered 2017-10-13: 1 via ORAL
  Filled 2017-10-13: qty 1

## 2017-10-13 MED ORDER — LIDOCAINE 5 % EX PTCH: 1.0000 | MEDICATED_PATCH | CUTANEOUS | 0 refills | Status: DC

## 2017-10-13 MED ORDER — CYCLOBENZAPRINE HCL 10 MG PO TABS: 10.0000 mg | ORAL_TABLET | Freq: Two times a day (BID) | ORAL | 0 refills | Status: DC | PRN

## 2017-10-13 MED ORDER — SODIUM CHLORIDE 0.9 % IV BOLUS (SEPSIS)
1000.0000 mL | Freq: Once | INTRAVENOUS | Status: AC
  Administered 2017-10-13: 1000 mL via INTRAVENOUS

## 2017-10-13 MED ORDER — KETOROLAC TROMETHAMINE 15 MG/ML IJ SOLN
15.0000 mg | Freq: Once | INTRAMUSCULAR | Status: AC
  Administered 2017-10-13: 15 mg via INTRAVENOUS
  Filled 2017-10-13: qty 1

## 2017-10-13 MED ORDER — NAPROXEN 500 MG PO TABS: 500.0000 mg | ORAL_TABLET | Freq: Two times a day (BID) | ORAL | 0 refills | Status: DC

## 2017-10-13 NOTE — ED Provider Notes (Signed)
[facility] EMERGENCY DEPARTMENT Provider Note   CSN: [id number] Arrival date & time: 10/13/17  0349     History   Chief Complaint Chief Complaint  Patient presents with  . Back Pain  . Flank Pain    HPI Bradley [name] is a 37 y.o. male.  Patient is a 37 year old male with a history of bipolar disease, diabetes, morbid obesity, varicose veins, chronic pain who is presenting today with sudden onset of severe right-sided flank pain that started at 7 PM last night shortly after he woke up from a nap.  He states the pain was 10 out of 10 and would radiate down to the right side of his groin.  Shortly after the pain started he started vomiting.  He had numerous episodes of vomiting throughout the night and some mild diarrhea.  Patient stated also had some mild pain with attempting to urinate.  He denied fever.  The only thing that seemed to make it worse was when he laid down and then try to get back up.  He denies any recent heavy lifting or injury.  He has never had pain like this before.  Patient denies any chest pain, coughing or shortness of breath.   The history is provided by the patient.    Past Medical History:  Diagnosis Date  . ADHD   . ADHD (attention deficit hyperactivity disorder)   . Bipolar 1 disorder ([place])   . Bipolar disorder ([place])   . Diabetes mellitus without complication ([place])   . Morbid obesity ([place])   . Obesity   . Panic attack   . Varicose veins of both lower extremities with pain     Patient Active Problem List   Diagnosis Date Noted  . Chronic pain syndrome 05/20/2017  . Bipolar disorder, current episode mixed, moderate ([place]) 03/04/2017  . Varicose veins of both lower extremities with pain 02/18/2017  . Chronic deep vein thrombosis (DVT) of proximal vein of left lower extremity ([place]) 11/12/2016  . DVT (deep venous thrombosis), left 06/19/2016  . Smoking addiction 06/19/2016  . HTN (hypertension) 07/27/2013  . Morbid obesity  ([place]) 07/27/2013  . Bipolar 1 disorder, mixed, moderate ([place]) 07/27/2013  . DM2 (diabetes mellitus, type 2) ([place]) 04/26/2013    Past Surgical History:  Procedure Laterality Date  . surgery on meatus as a child         Home Medications    Prior to Admission medications   Medication Sig Start Date End Date Taking? Authorizing Provider  dicyclomine (BENTYL) 20 MG tablet Take 1 tablet (20 mg total) by mouth 3 (three) times daily as needed for spasms. 07/27/17   Blue, Olivia C, PA-C  glipiZIDE (GLUCOTROL) 5 MG tablet Take 1 tablet (5 mg total) by mouth 2 (two) times daily before a meal. 05/20/17   Jegede, Olugbemiga E, MD  glucose monitoring kit (FREESTYLE) monitoring kit 1 each by Does not apply route 4 (four) times daily - after meals and at bedtime. 1 month Diabetic Testing Supplies for QAC-QHS accuchecks. 07/27/13   Thurnell Lose, MD  loperamide (IMODIUM) 2 MG capsule Take 1 capsule (2 mg total) by mouth 4 (four) times daily as needed for diarrhea or loose stools. 06/14/17   Davonna Belling, MD  lurasidone (LATUDA) 40 MG TABS tablet Take 1 tablet (40 mg total) by mouth daily with breakfast. 05/20/17   Tresa Garter, MD  metFORMIN (GLUCOPHAGE-XR) 500 MG 24 hr tablet TAKE 2 TABLETS WITH BREAKFAST 05/20/17  Tresa Garter, MD  naproxen sodium (ANAPROX) 220 MG tablet Take 440 mg by mouth 2 (two) times daily with a meal.    [provider]  ondansetron (ZOFRAN-ODT) 4 MG disintegrating tablet Take 1 tablet (4 mg total) by mouth every 8 (eight) hours as needed for nausea or vomiting. 07/27/17   Blue, Olivia C, PA-C  traMADol (ULTRAM) 50 MG tablet Take 1 tablet (50 mg total) by mouth every 8 (eight) hours as needed. 09/11/17   Tresa Garter, MD    Family History No family history on file.  Social History Social History   Tobacco Use  . Smoking status: Current Every Day Smoker    Packs/day: 1.00    Types: Cigarettes  . Smokeless tobacco: Never Used    Substance Use Topics  . Alcohol use: Yes    Comment: Rarely  . Drug use: No     Allergies   Patient has no known allergies.   Review of Systems Review of Systems  All other systems reviewed and are negative.    Physical Exam Updated Vital Signs BP 113/77   Pulse 100   Temp 98.2 F (36.8 C) (Oral)   Resp 17   Ht 6' (1.829 m)   Wt (!) 138.3 kg (305 lb)   SpO2 100%   BMI 41.37 kg/m   Physical Exam  Constitutional: He is oriented to person, place, and time. He appears well-developed and well-nourished. No distress.  HENT:  Head: Normocephalic and atraumatic.  Mouth/Throat: Oropharynx is clear and moist.  Eyes: Conjunctivae and EOM are normal. Pupils are equal, round, and reactive to light.  Neck: Normal range of motion. Neck supple.  Cardiovascular: Regular rhythm and intact distal pulses. Tachycardia present.  No murmur heard. Pulmonary/Chest: Effort normal and breath sounds normal. No respiratory distress. He has no wheezes. He has no rales.  Abdominal: Soft. He exhibits no distension. There is tenderness in the right upper quadrant. There is CVA tenderness and positive Murphy's sign. There is no rebound and no guarding.    Musculoskeletal: Normal range of motion. He exhibits no edema or tenderness.  Neurological: He is alert and oriented to person, place, and time.  Skin: Skin is warm and dry. No rash noted. No erythema.  Psychiatric: He has a normal mood and affect. His behavior is normal.  Nursing note and vitals reviewed.    ED Treatments / Results  Labs (all labs ordered are listed, but only abnormal results are displayed) Labs Reviewed  CBC WITH DIFFERENTIAL/PLATELET - Abnormal; Notable for the following components:      Result Value   WBC 14.1 (*)    RBC 5.88 (*)    Hemoglobin 17.5 (*)    Neutro Abs 9.2 (*)    All other components within normal limits  COMPREHENSIVE METABOLIC PANEL - Abnormal; Notable for the following components:   Glucose, Bld  115 (*)    All other components within normal limits  URINALYSIS, ROUTINE W REFLEX MICROSCOPIC  LIPASE, BLOOD    EKG  EKG Interpretation None       Radiology Ct Renal Stone Study  Result Date: 10/13/2017 CLINICAL DATA:  Onset of RIGHT flank pain last p.m., history diabetes mellitus, smoker EXAM: CT ABDOMEN AND PELVIS WITHOUT CONTRAST TECHNIQUE: Multidetector CT imaging of the abdomen and pelvis was performed following the standard protocol without IV contrast. Sagittal and coronal MPR images reconstructed from axial data set. Oral contrast was not administered for this indication. COMPARISON:  None FINDINGS: Lower  chest: Lung bases clear Hepatobiliary: Slightly nodular appearance of the liver raising question of cirrhosis. No discrete hepatic mass lesion. Gallbladder unremarkable. No biliary dilatation. Pancreas: Normal appearance Spleen: Spleen enlarged, 14.7 x 5.8 x 16.0 cm (volume = 710 cm^3). No focal lesions. Adrenals/Urinary Tract: Adrenal glands normal appearance. Kidneys, ureters, and bladder normal appearance. No urinary tract dilatation or calcification. Stomach/Bowel: Normal appendix. Stomach and bowel loops normal appearance. Vascular/Lymphatic: Atherosclerotic calcifications aorta without aneurysm. No adenopathy. Reproductive: Unremarkable prostate gland and seminal vesicles Other: No free air or free fluid. No hernia or acute inflammatory process. Musculoskeletal: Unremarkable IMPRESSION: Question cirrhotic liver with splenomegaly. No urinary tract calcification or dilatation. No acute intra-abdominal or intrapelvic process otherwise identified. Electronically Signed   By: Lavonia Dana M.D.   On: 10/13/2017 10:02   US Abdomen Limited Ruq  Result Date: 10/13/2017 CLINICAL DATA:  Right upper quadrant pain.  Diabetes, obesity. EXAM: ULTRASOUND ABDOMEN LIMITED RIGHT UPPER QUADRANT COMPARISON:  Abdominal and pelvic CT scan of October 13, 2017 FINDINGS: Gallbladder: Visualization of  gallbladder is limited due to the patient's body habitus and available acoustical window. No stones are observed. The gallbladder wall is not abnormally thickened. There is no pericholecystic fluid or positive sonographic Murphy's sign. Common bile duct: Diameter: 1.8 mm Liver: The hepatic echotexture is increased where visualized. The surface contour of the liver is grossly normal. No focal mass is observed. There is no intrahepatic ductal dilation. Portal vein is patent on color Doppler imaging with normal direction of blood flow towards the liver. IMPRESSION: Limited study secondary to the patient's body habitus which attenuates the ultrasound beam. No gallstones or sonographic evidence of acute cholecystitis. No definite acute abnormality of the liver or common bile duct. Electronically Signed   By: David  Martinique M.D.   On: 10/13/2017 10:53    Procedures Procedures (including critical care time)  Medications Ordered in ED Medications  ondansetron (ZOFRAN) injection 4 mg (not administered)  sodium chloride 0.9 % bolus 1,000 mL (not administered)  oxyCODONE-acetaminophen (PERCOCET/ROXICET) 5-325 MG per tablet 1 tablet (1 tablet Oral Given 10/13/17 0428)     Initial Impression / Assessment and Plan / ED Course  I have reviewed the triage vital signs and the nursing notes.  Pertinent labs & imaging results that were available during my care of the patient were reviewed by me and considered in my medical decision making (see chart for details).     Patient presenting with acute onset of flank and abdominal pain.  Symptoms do sound suspicious for kidney stone however patient's urine without blood or evidence of infection.  Mild leukocytosis of 14,000 which could be related to his repetitive vomiting.  Also having some right upper quadrant pain so concern for possible gallbladder disease.  Patient's CMP without significant findings with normal LFTs.  Lipase is pending.  Patient does have a prior  history of opioid abuse and recently was here for withdrawal from narcotics.  We will be cautious in giving these medications.  Patient given IV fluids and Zofran and he also had Percocet while waiting in the waiting room.  Low suspicion for pyelonephritis as patient's urine is within normal limits.  Patient had a CT scan that showed question of cirrhosis but no other significant findings.  There was no evidence of renal stones or intestinal cause for his symptoms.  We will do a right upper quadrant ultrasound to ensure this is not gallbladder related.  Low suspicion that this is cardiac or respiratory in nature.  11:30 AM Lipase without significant elevation in right upper quadrant ultrasound within normal limits.  Most likely musculoskeletal.  Will treat with muscle relaxers and NSAIDs.  Will have patient follow-up if symptoms do not improve.  Final Clinical Impressions(s) / ED Diagnoses   Final diagnoses:  RUQ abdominal pain  Acute right-sided low back pain without sciatica    ED Discharge Orders        Ordered    cyclobenzaprine (FLEXERIL) 10 MG tablet  2 times daily PRN     10/13/17 1159    naproxen (NAPROSYN) 500 MG tablet  2 times daily     10/13/17 1159    lidocaine (LIDODERM) 5 %  Every 24 hours     10/13/17 1159       Blanchie Dessert, MD 10/13/17 1159

## 2017-10-13 NOTE — ED Triage Notes (Signed)
Patient presents stating earlier tonight he started with pain to the right flank area with pain radiating to the front and to the groin  States he has been urinating more and it burns with urination

## 2017-10-14 ENCOUNTER — Ambulatory Visit: Payer: Medicare Other | Attending: Internal Medicine | Admitting: Internal Medicine

## 2017-10-14 ENCOUNTER — Encounter: Payer: Self-pay | Admitting: Internal Medicine

## 2017-10-14 ENCOUNTER — Other Ambulatory Visit: Payer: Self-pay

## 2017-10-14 VITALS — BP 108/76 | HR 86 | Temp 97.8°F | Resp 16 | Wt 317.0 lb

## 2017-10-14 DIAGNOSIS — M79605 Pain in left leg: Secondary | ICD-10-CM | POA: Diagnosis not present

## 2017-10-14 DIAGNOSIS — Z86718 Personal history of other venous thrombosis and embolism: Secondary | ICD-10-CM | POA: Insufficient documentation

## 2017-10-14 DIAGNOSIS — Z7984 Long term (current) use of oral hypoglycemic drugs: Secondary | ICD-10-CM | POA: Insufficient documentation

## 2017-10-14 DIAGNOSIS — I10 Essential (primary) hypertension: Secondary | ICD-10-CM | POA: Diagnosis not present

## 2017-10-14 DIAGNOSIS — F17210 Nicotine dependence, cigarettes, uncomplicated: Secondary | ICD-10-CM | POA: Diagnosis not present

## 2017-10-14 DIAGNOSIS — F17200 Nicotine dependence, unspecified, uncomplicated: Secondary | ICD-10-CM | POA: Diagnosis not present

## 2017-10-14 DIAGNOSIS — M545 Low back pain: Secondary | ICD-10-CM | POA: Diagnosis not present

## 2017-10-14 DIAGNOSIS — G894 Chronic pain syndrome: Secondary | ICD-10-CM

## 2017-10-14 DIAGNOSIS — Z79899 Other long term (current) drug therapy: Secondary | ICD-10-CM | POA: Diagnosis not present

## 2017-10-14 DIAGNOSIS — F410 Panic disorder [episodic paroxysmal anxiety] without agoraphobia: Secondary | ICD-10-CM | POA: Insufficient documentation

## 2017-10-14 DIAGNOSIS — F3162 Bipolar disorder, current episode mixed, moderate: Secondary | ICD-10-CM | POA: Diagnosis not present

## 2017-10-14 DIAGNOSIS — F909 Attention-deficit hyperactivity disorder, unspecified type: Secondary | ICD-10-CM | POA: Insufficient documentation

## 2017-10-14 DIAGNOSIS — M79604 Pain in right leg: Secondary | ICD-10-CM | POA: Diagnosis not present

## 2017-10-14 DIAGNOSIS — G8929 Other chronic pain: Secondary | ICD-10-CM | POA: Diagnosis present

## 2017-10-14 DIAGNOSIS — E119 Type 2 diabetes mellitus without complications: Secondary | ICD-10-CM

## 2017-10-14 DIAGNOSIS — R202 Paresthesia of skin: Secondary | ICD-10-CM | POA: Insufficient documentation

## 2017-10-14 LAB — POCT CBG (FASTING - GLUCOSE)-MANUAL ENTRY: Glucose Fasting, POC: 100 mg/dL — AB (ref 70–99)

## 2017-10-14 MED ORDER — TRAMADOL HCL 50 MG PO TABS: 50.0000 mg | ORAL_TABLET | Freq: Three times a day (TID) | ORAL | 0 refills | Status: DC | PRN

## 2017-10-14 MED FILL — traMADol HCL 50 MG TABS: 50 | 30 days supply | Qty: 90 | Fill #0

## 2017-10-14 NOTE — Progress Notes (Signed)
F/u back pain, Seen ED on yesterday

## 2017-10-14 NOTE — Patient Instructions (Addendum)
Back Pain, Adult Back pain is very common. The pain often gets better over time. The cause of back pain is usually not dangerous. Most people can learn to manage their back pain on their own. Follow these instructions at home: Watch your back pain for any changes. The following actions may help to lessen any pain you are feeling:  Stay active. Start with short walks on flat ground if you can. Try to walk farther each day.  Exercise regularly as told by your doctor. Exercise helps your back heal faster. It also helps avoid future injury by keeping your muscles strong and flexible.  Do not sit, drive, or stand in one place for more than 30 minutes.  Do not stay in bed. Resting more than 1-2 days can slow down your recovery.  Be careful when you bend or lift an object. Use good form when lifting: ? Bend at your knees. ? Keep the object close to your body. ? Do not twist.  Sleep on a firm mattress. Lie on your side, and bend your knees. If you lie on your back, put a pillow under your knees.  Take medicines only as told by your doctor.  Put ice on the injured area. ? Put ice in a plastic bag. ? Place a towel between your skin and the bag. ? Leave the ice on for 20 minutes, 2-3 times a day for the first 2-3 days. After that, you can switch between ice and heat packs.  Avoid feeling anxious or stressed. Find good ways to deal with stress, such as exercise.  Maintain a healthy weight. Extra weight puts stress on your back.  Contact a doctor if:  You have pain that does not go away with rest or medicine.  You have worsening pain that goes down into your legs or buttocks.  You have pain that does not get better in one week.  You have pain at night.  You lose weight.  You have a fever or chills. Get help right away if:  You cannot control when you poop (bowel movement) or pee (urinate).  Your arms or legs feel weak.  Your arms or legs lose feeling (numbness).  You feel sick  to your stomach (nauseous) or throw up (vomit).  You have belly (abdominal) pain.  You feel like you may pass out (faint). This information is not intended to replace advice given to you by your health care provider. Make sure you discuss any questions you have with your health care provider. Document Released: 03/03/2008 Document Revised: 02/21/2016 Document Reviewed: 01/17/2014 Elsevier Interactive Patient Education  2018 [employer].   Diabetes Mellitus and Standards of Medical Care Managing diabetes (diabetes mellitus) can be complicated. Your diabetes treatment may be managed by a team of health care providers, including: A diet and nutrition specialist (registered dietitian). A nurse. A certified diabetes educator (CDE). A diabetes specialist (endocrinologist). An eye doctor. A primary care provider. A dentist.  Your health care providers follow a schedule in order to help you get the best quality of care. The following schedule is a general guideline for your diabetes management plan. Your health care providers may also give you more specific instructions. HbA1c ( hemoglobin A1c) test This test provides information about blood sugar (glucose) control over the previous 2-3 months. It is used to check whether your diabetes management plan needs to be adjusted. If you are meeting your treatment goals, this test is done at least 2 times a year. If you  are not meeting treatment goals or if your treatment goals have changed, this test is done 4 times a year.  Blood pressure test This test is done at every routine medical visit. For most people, the goal is less than 130/80. Ask your health care provider what your goal blood pressure should be. Dental and eye exams Visit your dentist two times a year. If you have type 1 diabetes, get an eye exam 3-5 years after you are diagnosed, and then once a year after your first exam. If you were diagnosed with type 1 diabetes as a child, get  an eye exam when you are age 87 or older and have had diabetes for 3-5 years. After the first exam, you should get an eye exam once a year. If you have type 2 diabetes, have an eye exam as soon as you are diagnosed, and then once a year after your first exam. Foot care exam Visual foot exams are done at every routine medical visit. The exams check for cuts, bruises, redness, blisters, sores, or other problems with the feet. A complete foot exam is done by your health care provider once a year. This exam includes an inspection of the structure and skin of your feet, and a check of the pulses and sensation in your feet. Type 1 diabetes: Get your first exam 3-5 years after diagnosis. Type 2 diabetes: Get your first exam as soon as you are diagnosed. Check your feet every day for cuts, bruises, redness, blisters, or sores. If you have any of these or other problems that are not healing, contact your health care provider. Kidney function test ( urine microalbumin) This test is done once a year. Type 1 diabetes: Get your first test 5 years after diagnosis. Type 2 diabetes: Get your first test as soon as you are diagnosed. If you have chronic kidney disease (CKD), get a serum creatinine and estimated glomerular filtration rate (eGFR) test once a year. Lipid profile (cholesterol, HDL, LDL, triglycerides) This test should be done when you are diagnosed with diabetes, and every 5 years after the first test. If you are on medicines to lower your cholesterol, you may need to get this test done every year. The goal for LDL is less than 100 mg/dL (5.5 mmol/L). If you are at high risk, the goal is less than 70 mg/dL (3.9 mmol/L). The goal for HDL is 40 mg/dL (2.2 mmol/L) for men and 50 mg/dL(2.8 mmol/L) for women. An HDL cholesterol of 60 mg/dL (3.3 mmol/L) or higher gives some protection against heart disease. The goal for triglycerides is less than 150 mg/dL (8.3 mmol/L). Immunizations The yearly flu  (influenza) vaccine is recommended for everyone 6 months or older who has diabetes. The pneumonia (pneumococcal) vaccine is recommended for everyone 2 years or older who has diabetes. If you are 19 or older, you may get the pneumonia vaccine as a series of two separate shots. The hepatitis B vaccine is recommended for adults shortly after they have been diagnosed with diabetes. The Tdap (tetanus, diphtheria, and pertussis) vaccine should be given: According to normal childhood vaccination schedules, for children. Every 10 years, for adults who have diabetes. The shingles vaccine is recommended for people who have had chicken pox and are 50 years or older. Mental and emotional health Screening for symptoms of eating disorders, anxiety, and depression is recommended at the time of diagnosis and afterward as needed. If your screening shows that you have symptoms (you have a positive screening result),  you may need further evaluation and be referred to a mental health care provider. Diabetes self-management education Education about how to manage your diabetes is recommended at diagnosis and ongoing as needed. Treatment plan Your treatment plan will be reviewed at every medical visit. Summary Managing diabetes (diabetes mellitus) can be complicated. Your diabetes treatment may be managed by a team of health care providers. Your health care providers follow a schedule in order to help you get the best quality of care. Standards of care including having regular physical exams, blood tests, blood pressure monitoring, immunizations, screening tests, and education about how to manage your diabetes. Your health care providers may also give you more specific instructions based on your individual health. This information is not intended to replace advice given to you by your health care provider. Make sure you discuss any questions you have with your health care provider. Document Released: 07/13/2009  Document Revised: 06/13/2016 Document Reviewed: 06/13/2016 Elsevier Interactive Patient Education  [employer].

## 2017-10-14 NOTE — Progress Notes (Signed)
Subjective:  Patient ID: Bradley [name], male    DOB: April 02, 1981  Age: 37 y.o. MRN: [id number]  CC: Chronic pain in legs  HPI Bradley [name] is a 37 y/o Caucasian male with medical history of Type II DM, Bipolar type 1, ADHD, and Obesity. Patient presents today for chronic pain, follow-up for DM and ER visit for LBP.    Chronic Pain Syndrome: Patient reports persistant pain/paresthesia in bilateral lower extremities controlled with tramadol. Requesting a refill on medication.   LBP: Patient reports low back pain from moving furniture on Monday. Patient was seen at local Emergency Department (10/13/17) for muscle spasms and lumbar stiffness. Patient prescribed flexeril, naproxen, and lidocaine patch. Reports controlled pain at this time (1/10). Denies loss of bowels or bladder. Denies increased paresthesia.  DM II: Patient adherent to medication regimen without difficulty. Home blood glucoses have ranged from 100's-120's. Denies polyuria, polyphagia, polydipsia. Denies increased paresthesia. Denies visual changes. Patient needs appointment with Ophthalmology and Podiatry.   Past Medical History:  Diagnosis Date  . ADHD   . ADHD (attention deficit hyperactivity disorder)   . Bipolar 1 disorder ([place])   . Bipolar disorder ([place])   . Diabetes mellitus without complication ([place])   . Morbid obesity ([place])   . Obesity   . Panic attack   . Varicose veins of both lower extremities with pain    Patient Active Problem List   Diagnosis Date Noted  . Chronic pain syndrome 05/20/2017  . Bipolar disorder, current episode mixed, moderate ([place]) 03/04/2017  . Varicose veins of both lower extremities with pain 02/18/2017  . Chronic deep vein thrombosis (DVT) of proximal vein of left lower extremity ([place]) 11/12/2016  . DVT (deep venous thrombosis), left 06/19/2016  . Smoking addiction 06/19/2016  . HTN (hypertension) 07/27/2013  . Morbid obesity ([place]) 07/27/2013  . Bipolar 1 disorder, mixed,  moderate ([place]) 07/27/2013  . DM2 (diabetes mellitus, type 2) ([place]) 04/26/2013   Past Surgical History:  Procedure Laterality Date  . surgery on meatus as a child     Social History   Socioeconomic History  . Marital status: Single    Spouse name: Not on file  . Number of children: Not on file  . Years of education: Not on file  . Highest education level: Not on file  Social Needs  . Financial resource strain: Not on file  . Food insecurity - worry: Not on file  . Food insecurity - inability: Not on file  . Transportation needs - medical: Not on file  . Transportation needs - non-medical: Not on file  Occupational History  . Not on file  Tobacco Use  . Smoking status: Current Every Day Smoker    Packs/day: 1.00    Types: Cigarettes  . Smokeless tobacco: Never Used  Substance and Sexual Activity  . Alcohol use: Yes    Comment: Rarely  . Drug use: No  . Sexual activity: Not on file  Other Topics Concern  . Not on file  Social History Narrative   ** Merged History Encounter **       Outpatient Medications Prior to Visit  Medication Sig Dispense Refill  . cyclobenzaprine (FLEXERIL) 10 MG tablet Take 1 tablet (10 mg total) by mouth 2 (two) times daily as needed for muscle spasms. 20 tablet 0  . glipiZIDE (GLUCOTROL) 5 MG tablet Take 1 tablet (5 mg total) by mouth 2 (two) times daily before a meal. 60 tablet 3  . ibuprofen (ADVIL,MOTRIN) 200  MG tablet Take 800 mg by mouth every 8 (eight) hours as needed for moderate pain.    Marland Kitchen lurasidone (LATUDA) 40 MG TABS tablet Take 1 tablet (40 mg total) by mouth daily with breakfast. 30 tablet 3  . metFORMIN (GLUCOPHAGE-XR) 500 MG 24 hr tablet TAKE 2 TABLETS WITH BREAKFAST 180 tablet 3  . naproxen (NAPROSYN) 500 MG tablet Take 1 tablet (500 mg total) by mouth 2 (two) times daily. 30 tablet 0  . traMADol (ULTRAM) 50 MG tablet Take 1 tablet (50 mg total) by mouth every 8 (eight) hours as needed. 60 tablet 0  . glucose monitoring kit  (FREESTYLE) monitoring kit 1 each by Does not apply route 4 (four) times daily - after meals and at bedtime. 1 month Diabetic Testing Supplies for QAC-QHS accuchecks. 1 each 1  . Menthol (ICY HOT ADVANCED RELIEF) 7.5 % PTCH Apply 1 patch topically as needed (pain).    . naproxen sodium (ANAPROX) 220 MG tablet Take 440 mg by mouth 2 (two) times daily with a meal.    . dicyclomine (BENTYL) 20 MG tablet Take 1 tablet (20 mg total) by mouth 3 (three) times daily as needed for spasms. (Patient not taking: Reported on 10/13/2017) 30 tablet 0  . lidocaine (LIDODERM) 5 % Place 1 patch onto the skin daily. Remove & Discard patch within 12 hours or as directed by MD (Patient not taking: Reported on 10/14/2017) 30 patch 0  . loperamide (IMODIUM) 2 MG capsule Take 1 capsule (2 mg total) by mouth 4 (four) times daily as needed for diarrhea or loose stools. (Patient not taking: Reported on 10/14/2017) 12 capsule 0  . ondansetron (ZOFRAN-ODT) 4 MG disintegrating tablet Take 1 tablet (4 mg total) by mouth every 8 (eight) hours as needed for nausea or vomiting. (Patient not taking: Reported on 10/14/2017) 15 tablet 0   No facility-administered medications prior to visit.    No Known Allergies  ROS Review of Systems  Constitutional: Negative for activity change, appetite change and fatigue.  Eyes: Negative for visual disturbance.  Gastrointestinal: Negative for abdominal pain, nausea and vomiting.  Endocrine: Negative for polydipsia, polyphagia and polyuria.  Musculoskeletal: Positive for back pain and myalgias. Negative for gait problem, neck pain and neck stiffness.  Skin: Negative for color change, rash and wound.  Neurological: Negative for weakness and numbness.  Psychiatric/Behavioral: Negative for agitation, dysphoric mood and sleep disturbance.  All other systems reviewed and are negative.   Objective:  BP 108/76   Pulse 86   Temp 97.8 F (36.6 C) (Oral)   Resp 16   Wt (!) 317 lb (143.8 kg)   SpO2  96%   BMI 42.99 kg/m   BP/Weight 10/14/2017 10/13/2017 38/75/6433  Systolic BP 295 188 416  Diastolic BP 76 75 70  Wt. (Lbs) 317 305 310  BMI 42.99 41.37 42.04  Some encounter information is confidential and restricted. Go to Review Flowsheets activity to see all data.   Lab Results  Component Value Date   HGBA1C 6.2 08/19/2017   Physical Exam  Constitutional: He is oriented to person, place, and time. He appears well-developed and well-nourished. No distress.  HENT:  Head: Normocephalic and atraumatic.  Eyes: EOM are normal.  Neck: Normal range of motion. Neck supple.  Cardiovascular: S1 normal and S2 normal.  Pulses:      Radial pulses are 2+ on the right side, and 2+ on the left side.  Pulmonary/Chest: Effort normal and breath sounds normal.  Abdominal: Soft. Bowel  sounds are normal. There is no tenderness.  Musculoskeletal: He exhibits no edema.       Lumbar back: He exhibits tenderness (mild tenderness on palpitation). He exhibits normal range of motion and no deformity.  Neurological: He is alert and oriented to person, place, and time. He has normal strength.  Skin: Skin is warm, dry and intact. No rash noted.  Psychiatric: He has a normal mood and affect. His behavior is normal. Judgment and thought content normal.  Nursing note and vitals reviewed.  Assessment & Plan:   1. Type 2 diabetes mellitus without complication, without long-term current use of insulin ([place]), controlled  - POC glucose 100 mg/dL today. Last A1c 6.2 on 08/19/17. Continue medication regimen.  - Education provided on DM management and diet.  - Follow-up as previously schedule for DM management.  - Glucose (CBG), Fasting  2. Chronic pain syndrome, controlled - Continue medication regimen.  - Encouraged to increase physical activity to support bone and muscle strength. - traMADol (ULTRAM) 50 MG tablet; Take 1 tablet (50 mg total) by mouth every 8 (eight) hours as needed.  Dispense: 90 tablet;  Refill: 0  3. Smoking addiction, uncontrolled - Encouraged smoking cessation. Patient declined intervention at this time. No stop date made.   4. Medication management - Reviewed scheduled medications.  - Increased Tramadol dispense to 90 tablets.   Meds ordered this encounter  Medications  . traMADol (ULTRAM) 50 MG tablet    Sig: Take 1 tablet (50 mg total) by mouth every 8 (eight) hours as needed.    Dispense:  90 tablet    Refill:  0    Follow-up: Return for A1c and DM management.   Krystle H. Hulan Fray, Student DNP  Evaluation and management procedures were performed by me with DNP Student in attendance, note written by DNP student under my supervision and collaboration. I have reviewed the note and I agree with the management and plan.   Angelica Chessman, MD, [place], [place], Karilyn [facility] and [facility] [place], [place]   10/17/2017, 6:10 PM

## 2017-10-20 MED FILL — glipiZIDE 5 MG TABS: 5 | 30 days supply | Qty: 60 | Fill #2

## 2017-10-20 MED FILL — METFORMIN HCL ER 500 MG TAB: 500 | 30 days supply | Qty: 60 | Fill #3

## 2017-11-11 ENCOUNTER — Encounter: Payer: Self-pay | Admitting: Internal Medicine

## 2017-11-11 ENCOUNTER — Other Ambulatory Visit: Payer: Self-pay

## 2017-11-11 ENCOUNTER — Ambulatory Visit: Payer: Medicare Other | Attending: Internal Medicine | Admitting: Internal Medicine

## 2017-11-11 VITALS — BP 119/84 | HR 100 | Temp 97.5°F | Resp 16 | Wt 324.0 lb

## 2017-11-11 DIAGNOSIS — I825Y2 Chronic embolism and thrombosis of unspecified deep veins of left proximal lower extremity: Secondary | ICD-10-CM | POA: Diagnosis not present

## 2017-11-11 DIAGNOSIS — I83813 Varicose veins of bilateral lower extremities with pain: Secondary | ICD-10-CM | POA: Insufficient documentation

## 2017-11-11 DIAGNOSIS — F410 Panic disorder [episodic paroxysmal anxiety] without agoraphobia: Secondary | ICD-10-CM | POA: Diagnosis not present

## 2017-11-11 DIAGNOSIS — Z7984 Long term (current) use of oral hypoglycemic drugs: Secondary | ICD-10-CM | POA: Diagnosis not present

## 2017-11-11 DIAGNOSIS — F17200 Nicotine dependence, unspecified, uncomplicated: Secondary | ICD-10-CM | POA: Diagnosis not present

## 2017-11-11 DIAGNOSIS — I10 Essential (primary) hypertension: Secondary | ICD-10-CM | POA: Insufficient documentation

## 2017-11-11 DIAGNOSIS — Z79899 Other long term (current) drug therapy: Secondary | ICD-10-CM | POA: Insufficient documentation

## 2017-11-11 DIAGNOSIS — F909 Attention-deficit hyperactivity disorder, unspecified type: Secondary | ICD-10-CM | POA: Diagnosis not present

## 2017-11-11 DIAGNOSIS — F17210 Nicotine dependence, cigarettes, uncomplicated: Secondary | ICD-10-CM | POA: Insufficient documentation

## 2017-11-11 DIAGNOSIS — E119 Type 2 diabetes mellitus without complications: Secondary | ICD-10-CM | POA: Insufficient documentation

## 2017-11-11 DIAGNOSIS — F3162 Bipolar disorder, current episode mixed, moderate: Secondary | ICD-10-CM

## 2017-11-11 DIAGNOSIS — G894 Chronic pain syndrome: Secondary | ICD-10-CM | POA: Insufficient documentation

## 2017-11-11 LAB — POCT CBG (FASTING - GLUCOSE)-MANUAL ENTRY: GLUCOSE FASTING, POC: 127 mg/dL — AB (ref 70–99)

## 2017-11-11 MED ORDER — TRAMADOL HCL 50 MG PO TABS: 50.0000 mg | ORAL_TABLET | Freq: Three times a day (TID) | ORAL | 0 refills | Status: DC | PRN

## 2017-11-11 NOTE — Progress Notes (Signed)
Subjective:  Patient ID: Bradley [name], male    DOB: November 21, 1980  Age: 37 y.o. MRN: [id number]  CC: Leg Pain and Diabetes   HPI Bradley [name] is a 37 y/o male with medical hx of type 1 Bipolar d/o, type 2 DM, and chronic pain syndrome. He presents today for f/u of chronic conditions.   DM: Patient compliant with medication regimen. Reports compliance with a low sugar diet and physical activity with walking every other day. Reports glucose ranges in the 120's. Denies episodes of hypoglycemia or hyperglycemia. Denies polyuria, polyphagia, or polydipsia. Reports paresthesia at baseline with no acute changes in characteristics. Denies changes in vision. He does not see an Ophthalmologist due to the cost in co-pay with Medicaid insurance. He does not see a Podiatrist.   Bipolar d/o: Patient compliant with medication regimen. He is followed by mental health at [facility]. Denies lack of energy, loss of interest, sleep disturbance, unexplained excess in excitement, or SI/HI.  Chronic Pain: Patient reports chronic pain in his lower extremities, bilaterally. Reports pain is well controlled with Tramadol. He is requesting refill in his medications.  Past Medical History:  Diagnosis Date  . ADHD   . ADHD (attention deficit hyperactivity disorder)   . Bipolar 1 disorder ([place])   . Bipolar disorder ([place])   . Diabetes mellitus without complication ([place])   . Morbid obesity ([place])   . Obesity   . Panic attack   . Varicose veins of both lower extremities with pain    Past Surgical History:  Procedure Laterality Date  . surgery on meatus as a child     Patient Active Problem List   Diagnosis Date Noted  . Chronic pain syndrome 05/20/2017  . Bipolar disorder, current episode mixed, moderate ([place]) 03/04/2017  . Varicose veins of both lower extremities with pain 02/18/2017  . Chronic deep vein thrombosis (DVT) of proximal vein of left lower extremity ([place]) 11/12/2016  . DVT (deep venous  thrombosis), left 06/19/2016  . Smoking addiction 06/19/2016  . HTN (hypertension) 07/27/2013  . Morbid obesity ([place]) 07/27/2013  . Bipolar 1 disorder, mixed, moderate ([place]) 07/27/2013  . DM2 (diabetes mellitus, type 2) ([place]) 04/26/2013   Social History   Socioeconomic History  . Marital status: Single    Spouse name: Not on file  . Number of children: Not on file  . Years of education: Not on file  . Highest education level: Not on file  Social Needs  . Financial resource strain: Not on file  . Food insecurity - worry: Not on file  . Food insecurity - inability: Not on file  . Transportation needs - medical: Not on file  . Transportation needs - non-medical: Not on file  Occupational History  . Not on file  Tobacco Use  . Smoking status: Current Every Day Smoker    Packs/day: 1.00    Types: Cigarettes  . Smokeless tobacco: Never Used  Substance and Sexual Activity  . Alcohol use: Yes    Comment: Rarely  . Drug use: No  . Sexual activity: Not on file  Other Topics Concern  . Not on file  Social History Narrative   ** Merged History Encounter **       Outpatient Medications Prior to Visit  Medication Sig Dispense Refill  . cyclobenzaprine (FLEXERIL) 10 MG tablet Take 1 tablet (10 mg total) by mouth 2 (two) times daily as needed for muscle spasms. 20 tablet 0  . glipiZIDE (GLUCOTROL) 5 MG tablet Take  1 tablet (5 mg total) by mouth 2 (two) times daily before a meal. 60 tablet 3  . glucose monitoring kit (FREESTYLE) monitoring kit 1 each by Does not apply route 4 (four) times daily - after meals and at bedtime. 1 month Diabetic Testing Supplies for QAC-QHS accuchecks. 1 each 1  . ibuprofen (ADVIL,MOTRIN) 200 MG tablet Take 800 mg by mouth every 8 (eight) hours as needed for moderate pain.    Marland Kitchen lurasidone (LATUDA) 40 MG TABS tablet Take 1 tablet (40 mg total) by mouth daily with breakfast. 30 tablet 3  . Menthol (ICY HOT ADVANCED RELIEF) 7.5 % PTCH Apply 1 patch topically as  needed (pain).    . metFORMIN (GLUCOPHAGE-XR) 500 MG 24 hr tablet TAKE 2 TABLETS WITH BREAKFAST 180 tablet 3  . naproxen (NAPROSYN) 500 MG tablet Take 1 tablet (500 mg total) by mouth 2 (two) times daily. 30 tablet 0  . naproxen sodium (ANAPROX) 220 MG tablet Take 440 mg by mouth 2 (two) times daily with a meal.    . traMADol (ULTRAM) 50 MG tablet Take 1 tablet (50 mg total) by mouth every 8 (eight) hours as needed. 90 tablet 0   No facility-administered medications prior to visit.    No Known Allergies  ROS Review of Systems  Constitutional: Negative for activity change, appetite change, fatigue, fever and unexpected weight change.  Eyes: Negative for visual disturbance.  Cardiovascular: Negative for chest pain, palpitations and leg swelling.  Gastrointestinal: Negative for abdominal distention, abdominal pain, constipation, diarrhea, nausea and vomiting.  Endocrine: Negative for polydipsia, polyphagia and polyuria.  Skin: Negative for color change, rash and wound.  Neurological: Positive for numbness (at baseline; no changes in characteristics). Negative for dizziness, tremors, weakness, light-headedness and headaches.  Psychiatric/Behavioral: Negative for decreased concentration, dysphoric mood, hallucinations, sleep disturbance and suicidal ideas. The patient is not nervous/anxious and is not hyperactive.   All other systems reviewed and are negative.  Objective:  BP 119/84 (BP Location: Left Arm, Patient Position: Sitting, Cuff Size: Large)   Pulse 100   Temp (!) 97.5 F (36.4 C) (Oral)   Resp 16   Wt (!) 324 lb (147 kg)   SpO2 100%   BMI 43.94 kg/m   BP/Weight 11/11/2017 10/14/2017 2/62/0355  Systolic BP 974 163 845  Diastolic BP 84 76 75  Wt. (Lbs) 324 317 305  BMI 43.94 42.99 41.37  Some encounter information is confidential and restricted. Go to Review Flowsheets activity to see all data.   Lab Results  Component Value Date   HGBA1C 6.2 08/19/2017   Physical Exam    Constitutional: He is oriented to person, place, and time. He appears well-developed and well-nourished. No distress.  Patient is unkempt and morbidly obese with poor dentition.   HENT:  Head: Normocephalic and atraumatic.  Eyes: Conjunctivae and EOM are normal.  Neck: Normal range of motion. Neck supple. No JVD present. Carotid bruit is not present.  Cardiovascular: Normal heart sounds. Exam reveals no gallop and no friction rub.  No murmur heard. Pulses:      Radial pulses are 2+ on the right side, and 2+ on the left side.       Dorsalis pedis pulses are 2+ on the right side, and 2+ on the left side.  Pulmonary/Chest: Effort normal and breath sounds normal. No respiratory distress. He exhibits no tenderness.  Abdominal: Soft. Bowel sounds are normal. He exhibits no distension. There is no tenderness.  Musculoskeletal: Normal range of motion. He  exhibits tenderness (bilateral lower extremities).  Neurological: He is alert and oriented to person, place, and time. He has normal strength and normal reflexes. No sensory deficit.  monofilament +9, bilaterally   Skin: Skin is warm, dry and intact. No rash noted.  Psychiatric: He has a normal mood and affect. His speech is normal and behavior is normal. Judgment and thought content normal.  Nursing note and vitals reviewed.  Assessment & Plan:   1. Type 2 diabetes mellitus without complication, without long-term current use of insulin ([place]), controlled - In clinic poc glucose was 127 mg/dL; A1c sent to lab. Patient is well controlled with medication regimen. Denies hypoglycemic/hyperglycemic episodes.  - Continue medication regimen. - Instructed to maintain low carb, low sugar, and low salt diet.  - Instructed to maintain 150 min/week exercise regimen.  - f/u in 3 months. - Glucose (CBG), Fasting - HgB A1c  2. Chronic pain syndrome, controlled - Patient denies increase pain or changes in characteristics. Refilled Tramadol.   - Continue  medication regimen; f/u as needed.  - traMADol (ULTRAM) 50 MG tablet; Take 1 tablet (50 mg total) by mouth every 8 (eight) hours as needed.  Dispense: 90 tablet; Refill: 0  3. Bipolar disorder, current episode mixed, moderate ([place]), controlled - Patient is well controlled. He managed through mental health services at [facility].  - Continue medication regimen and f/u with [facility].  4. Smoking addiction, uncontrolled - Patient reports reduced cigarette use from 1.5 ppd to 14 cigarrettes per day. Declined assistance with smoking cessation.  - Explained the importance of smoking cessation. Set goal to reduce cigarettes to 7 per day. Will f/u in 3 months.   Meds ordered this encounter  Medications  . traMADol (ULTRAM) 50 MG tablet    Sig: Take 1 tablet (50 mg total) by mouth every 8 (eight) hours as needed.    Dispense:  90 tablet    Refill:  0    Follow-up: Return in about 3 months (around 02/08/2018).   Krystle H. Hulan Fray, AGDNP-student   Evaluation and management procedures were performed by me with DNP Student in attendance, note written by DNP student under my supervision and collaboration. I have reviewed the note and I agree with the management and plan.   Angelica Chessman, MD, [place], [place], [facility] and [facility] [place], [place]   11/12/2017, 2:25 PM

## 2017-11-11 NOTE — Progress Notes (Signed)
Pt here for # month Diabetes f/u [facility] pain RF on pain medication

## 2017-11-11 NOTE — Patient Instructions (Addendum)
Diabetes Mellitus and Nutrition When you have diabetes (diabetes mellitus), it is very important to have healthy eating habits because your blood sugar (glucose) levels are greatly affected by what you eat and drink. Eating healthy foods in the appropriate amounts, at about the same times every day, can help you:  Control your blood glucose.  Lower your risk of heart disease.  Improve your blood pressure.  Reach or maintain a healthy weight.  Every person with diabetes is different, and each person has different needs for a meal plan. Your health care provider may recommend that you work with a diet and nutrition specialist (dietitian) to make a meal plan that is best for you. Your meal plan may vary depending on factors such as:  The calories you need.  The medicines you take.  Your weight.  Your blood glucose, blood pressure, and cholesterol levels.  Your activity level.  Other health conditions you have, such as heart or kidney disease.  How do carbohydrates affect me? Carbohydrates affect your blood glucose level more than any other type of food. Eating carbohydrates naturally increases the amount of glucose in your blood. Carbohydrate counting is a method for keeping track of how many carbohydrates you eat. Counting carbohydrates is important to keep your blood glucose at a healthy level, especially if you use insulin or take certain oral diabetes medicines. It is important to know how many carbohydrates you can safely have in each meal. This is different for every person. Your dietitian can help you calculate how many carbohydrates you should have at each meal and for snack. Foods that contain carbohydrates include:  Bread, cereal, rice, pasta, and crackers.  Potatoes and corn.  Peas, beans, and lentils.  Milk and yogurt.  Fruit and juice.  Desserts, such as cakes, cookies, ice cream, and candy.  How does alcohol affect me? Alcohol can cause a sudden decrease in blood  glucose (hypoglycemia), especially if you use insulin or take certain oral diabetes medicines. Hypoglycemia can be a life-threatening condition. Symptoms of hypoglycemia (sleepiness, dizziness, and confusion) are similar to symptoms of having too much alcohol. If your health care provider says that alcohol is safe for you, follow these guidelines:  Limit alcohol intake to no more than 1 drink per day for nonpregnant women and 2 drinks per day for men. One drink equals 12 oz of beer, 5 oz of wine, or 1 oz of hard liquor.  Do not drink on an empty stomach.  Keep yourself hydrated with water, diet soda, or unsweetened iced tea.  Keep in mind that regular soda, juice, and other mixers may contain a lot of sugar and must be counted as carbohydrates.  What are tips for following this plan? Reading food labels  Start by checking the serving size on the label. The amount of calories, carbohydrates, fats, and other nutrients listed on the label are based on one serving of the food. Many foods contain more than one serving per package.  Check the total grams (g) of carbohydrates in one serving. You can calculate the number of servings of carbohydrates in one serving by dividing the total carbohydrates by 15. For example, if a food has 30 g of total carbohydrates, it would be equal to 2 servings of carbohydrates.  Check the number of grams (g) of saturated and trans fats in one serving. Choose foods that have low or no amount of these fats.  Check the number of milligrams (mg) of sodium in one serving. Most people   should limit total sodium intake to less than 2,300 mg per day.  Always check the nutrition information of foods labeled as "low-fat" or "nonfat". These foods may be higher in added sugar or refined carbohydrates and should be avoided.  Talk to your dietitian to identify your daily goals for nutrients listed on the label. Shopping  Avoid buying canned, premade, or processed foods. These  foods tend to be high in fat, sodium, and added sugar.  Shop around the outside edge of the grocery store. This includes fresh fruits and vegetables, bulk grains, fresh meats, and fresh dairy. Cooking  Use low-heat cooking methods, such as baking, instead of high-heat cooking methods like deep frying.  Cook using healthy oils, such as olive, canola, or sunflower oil.  Avoid cooking with butter, cream, or high-fat meats. Meal planning  Eat meals and snacks regularly, preferably at the same times every day. Avoid going long periods of time without eating.  Eat foods high in fiber, such as fresh fruits, vegetables, beans, and whole grains. Talk to your dietitian about how many servings of carbohydrates you can eat at each meal.  Eat 4-6 ounces of lean protein each day, such as lean meat, chicken, fish, eggs, or tofu. 1 ounce is equal to 1 ounce of meat, chicken, or fish, 1 egg, or 1/4 cup of tofu.  Eat some foods each day that contain healthy fats, such as avocado, nuts, seeds, and fish. Lifestyle   Check your blood glucose regularly.  Exercise at least 30 minutes 5 or more days each week, or as told by your health care provider.  Take medicines as told by your health care provider.  Do not use any products that contain nicotine or tobacco, such as cigarettes and e-cigarettes. If you need help quitting, ask your health care provider.  Work with a Veterinary surgeon or diabetes educator to identify strategies to manage stress and any emotional and social challenges. What are some questions to ask my health care provider?  Do I need to meet with a diabetes educator?  Do I need to meet with a dietitian?  What number can I call if I have questions?  When are the best times to check my blood glucose? Where to find more information:  American Diabetes Association: diabetes.org/food-and-fitness/food  Academy of Nutrition and Dietetics:  https://www.vargas.com/  [employer] of Diabetes and Digestive and Kidney Diseases (NIH): FindJewelers.cz Summary  A healthy meal plan will help you control your blood glucose and maintain a healthy lifestyle.  Working with a diet and nutrition specialist (dietitian) can help you make a meal plan that is best for you.  Keep in mind that carbohydrates and alcohol have immediate effects on your blood glucose levels. It is important to count carbohydrates and to use alcohol carefully. This information is not intended to replace advice given to you by your health care provider. Make sure you discuss any questions you have with your health care provider. Document Released: 06/12/2005 Document Revised: 10/20/2016 Document Reviewed: 10/20/2016 Elsevier Interactive Patient Education  2018 [employer].  Smoking Tobacco Information Smoking tobacco will very likely harm your health. Tobacco contains a poisonous (toxic), colorless chemical called nicotine. Nicotine affects the brain and makes tobacco addictive. This change in your brain can make it hard to stop smoking. Tobacco also has other toxic chemicals that can hurt your body and raise your risk of many cancers. How can smoking tobacco affect me? Smoking tobacco can increase your chances of having serious health conditions, such  as:  Cancer. Smoking is most commonly associated with lung cancer, but can lead to cancer in other parts of the body.  Chronic obstructive pulmonary disease (COPD). This is a long-term lung condition that makes it hard to breathe. It also gets worse over time.  High blood pressure (hypertension), heart disease, stroke, or heart attack.  Lung infections, such as pneumonia.  Cataracts. This is when the lenses in the eyes become clouded.  Digestive problems. This may include peptic ulcers, heartburn,  and gastroesophageal reflux disease (GERD).  Oral health problems, such as gum disease and tooth loss.  Loss of taste and smell.  Smoking can affect your appearance by causing:  Wrinkles.  Yellow or stained teeth, fingers, and fingernails.  Smoking tobacco can also affect your social life.  Many workplaces, [employer]aurants, hotels, and public places are tobacco-free. This means that you may experience challenges in finding places to smoke when away from home.  The cost of a smoking habit can be expensive. Expenses for someone who smokes come in two ways: ? You spend money on a regular basis to buy tobacco. ? Your health care costs in the long-term are higher if you smoke.  Tobacco smoke can also affect the health of those around you. Children of smokers have greater chances of: ? Sudden infant death syndrome (SIDS). ? Ear infections. ? Lung infections.  What lifestyle changes can be made?  Do not start smoking. Quit if you already do.  To quit smoking: ? Make a plan to quit smoking and commit yourself to it. Look for programs to help you and ask your health care provider for recommendations and ideas. ? Talk with your health care provider about using nicotine replacement medicines to help you quit. Medicine replacement medicines include gum, lozenges, patches, sprays, or pills. ? Do not replace cigarette smoking with electronic cigarettes, which are commonly called e-cigarettes. The safety of e-cigarettes is not known, and some may contain harmful chemicals. ? Avoid places, people, or situations that tempt you to smoke. ? If you try to quit but return to smoking, don't give up hope. It is very common for people to try a number of times before they fully succeed. When you feel ready again, give it another try.  Quitting smoking might affect the way you eat as well as your weight. Be prepared to monitor your eating habits. Get support in planning and following a healthy diet.  Ask  your health care provider about having regular tests (screenings) to check for cancer. This may include blood tests, imaging tests, and other tests.  Exercise regularly. Consider taking walks, joining a gym, or doing yoga or exercise classes.  Develop skills to manage your stress. These skills include meditation. What are the benefits of quitting smoking? By quitting smoking, you may:  Lower your risk of getting cancer and other diseases caused by smoking.  Live longer.  Breathe better.  Lower your blood pressure and heart rate.  Stop your addiction to tobacco.  Stop creating secondhand smoke that hurts other people.  Improve your sense of taste and smell.  Look better over time, due to having fewer wrinkles and less staining.  What can happen if changes are not made? If you do not stop smoking, you may:  Get cancer and other diseases.  Develop COPD or other long-term (chronic) lung conditions.  Develop serious problems with your heart and blood vessels (cardiovascular system).  Need more tests to screen for problems caused by smoking.  Have  higher, long-term healthcare costs from medicines or treatments related to smoking.  Continue to have worsening changes in your lungs, mouth, and nose.  Where to find support: To get support to quit smoking, consider:  Asking your health care provider for more information and resources.  Taking classes to learn more about quitting smoking.  Looking for local organizations that offer resources about quitting smoking.  Joining a support group for people who want to quit smoking in your local community.  Where to find more information: You may find more information about quitting smoking from:  HelpGuide.org: www.helpguide.org/articles/addictions/how-to-quit-smoking.htm  BankRights.uy: smokefree.gov  American Lung Association: www.lung.org  Contact a health care provider if:  You have problems breathing.  Your lips,  nose, or fingers turn blue.  You have chest pain.  You are coughing up blood.  You feel faint or you pass out.  You have other noticeable changes that cause you to worry. Summary  Smoking tobacco can negatively affect your health, the health of those around you, your finances, and your social life.  Do not start smoking. Quit if you already do. If you need help quitting, ask your health care provider.  Think about joining a support group for people who want to quit smoking in your local community. There are many effective programs that will help you to quit this behavior. This information is not intended to replace advice given to you by your health care provider. Make sure you discuss any questions you have with your health care provider. Document Released: 09/30/2016 Document Revised: 09/30/2016 Document Reviewed: 09/30/2016 Elsevier Interactive Patient Education  [employer].

## 2017-11-12 LAB — HEMOGLOBIN A1C
ESTIMATED AVERAGE GLUCOSE: 131 mg/dL
Hgb A1c MFr Bld: 6.2 % — ABNORMAL HIGH (ref 4.8–5.6)

## 2017-11-13 MED FILL — traMADol HCL 50 MG TABS: 50 | 30 days supply | Qty: 90 | Fill #0

## 2017-11-20 ENCOUNTER — Telehealth: Payer: Self-pay | Admitting: Family Medicine

## 2017-11-20 MED FILL — METFORMIN HCL ER 500 MG TAB: 500 | 30 days supply | Qty: 60 | Fill #4

## 2017-11-20 NOTE — Telephone Encounter (Signed)
patinet wanted to know lab results. Dr. Hyman HopesJegede has not resulted labs yet. Please follow up when labs are resulted.

## 2017-11-20 NOTE — Telephone Encounter (Signed)
Patient called and requested for an update on his most recent results. Please fu once note is put in.

## 2017-11-30 NOTE — Telephone Encounter (Signed)
Patient verified DOB Patient is aware of A1C being 6.2 which is stable as the previous visit was the same. No further questions.

## 2017-12-09 ENCOUNTER — Telehealth: Payer: Self-pay | Admitting: Internal Medicine

## 2017-12-09 NOTE — Telephone Encounter (Signed)
Pt called to request a refill for traMADol (ULTRAM) 50 MG tablet °Please sent it to [facility] pharmacy, please follow up ° °

## 2017-12-14 ENCOUNTER — Telehealth: Payer: Self-pay | Admitting: Internal Medicine

## 2017-12-14 NOTE — Telephone Encounter (Signed)
Pt called to check on the statu of the refill request for  traMADol (ULTRAM) 50 MG tablet  [facility] Please follow up

## 2017-12-15 ENCOUNTER — Other Ambulatory Visit: Payer: Self-pay | Admitting: Internal Medicine

## 2017-12-15 DIAGNOSIS — G894 Chronic pain syndrome: Secondary | ICD-10-CM

## 2017-12-15 MED ORDER — TRAMADOL HCL 50 MG PO TABS: 50.0000 mg | ORAL_TABLET | Freq: Three times a day (TID) | ORAL | 0 refills | Status: DC | PRN

## 2017-12-15 NOTE — Telephone Encounter (Signed)
Refilled to the Pharmacy 

## 2017-12-16 ENCOUNTER — Other Ambulatory Visit: Payer: Self-pay | Admitting: *Deleted

## 2017-12-16 DIAGNOSIS — G894 Chronic pain syndrome: Secondary | ICD-10-CM

## 2017-12-16 MED ORDER — TRAMADOL HCL 50 MG PO TABS: 50.0000 mg | ORAL_TABLET | Freq: Three times a day (TID) | ORAL | 0 refills | Status: DC | PRN

## 2017-12-16 NOTE — Telephone Encounter (Signed)
[facility]

## 2017-12-22 MED FILL — METFORMIN HCL ER 500 MG TAB: 500 | 30 days supply | Qty: 60 | Fill #5

## 2018-01-05 MED FILL — glipiZIDE 5 MG TABS: 5 | 30 days supply | Qty: 60 | Fill #3

## 2018-01-11 ENCOUNTER — Other Ambulatory Visit: Payer: Self-pay | Admitting: Family Medicine

## 2018-01-11 ENCOUNTER — Telehealth: Payer: Self-pay | Admitting: Family Medicine

## 2018-01-11 DIAGNOSIS — G894 Chronic pain syndrome: Secondary | ICD-10-CM

## 2018-01-11 MED ORDER — TRAMADOL HCL 50 MG PO TABS: 50.0000 mg | ORAL_TABLET | Freq: Two times a day (BID) | ORAL | 0 refills | Status: DC | PRN

## 2018-01-11 NOTE — Telephone Encounter (Signed)
Patient called and requested for listed medications to be refilled. [facility] on [address] or [address]  traMADol (ULTRAM) 50 MG tablet [[id number]]   Please call pt to inform

## 2018-01-11 NOTE — Telephone Encounter (Signed)
Refilled

## 2018-01-11 NOTE — Progress Notes (Signed)
Done

## 2018-01-11 NOTE — Telephone Encounter (Signed)
Patient was informed and verbalized understanding.

## 2018-01-28 ENCOUNTER — Encounter (HOSPITAL_COMMUNITY): Payer: Self-pay | Admitting: Family Medicine

## 2018-01-28 ENCOUNTER — Ambulatory Visit (HOSPITAL_COMMUNITY)
Admission: EM | Admit: 2018-01-28 | Discharge: 2018-01-28 | Disposition: A | Discharge status: Home / Self Care | Payer: Medicare Other | Attending: Family Medicine | Admitting: Family Medicine

## 2018-01-28 DIAGNOSIS — R062 Wheezing: Secondary | ICD-10-CM

## 2018-01-28 DIAGNOSIS — J209 Acute bronchitis, unspecified: Secondary | ICD-10-CM

## 2018-01-28 DIAGNOSIS — R05 Cough: Secondary | ICD-10-CM | POA: Diagnosis not present

## 2018-01-28 MED ORDER — AZITHROMYCIN 250 MG PO TABS: 250.0000 mg | ORAL_TABLET | Freq: Every day | ORAL | 0 refills | Status: DC

## 2018-01-28 MED ORDER — PSEUDOEPH-BROMPHEN-DM 30-2-10 MG/5ML PO SYRP: 5.0000 mL | ORAL_SOLUTION | Freq: Four times a day (QID) | ORAL | 0 refills | Status: DC | PRN

## 2018-01-28 MED ORDER — IPRATROPIUM-ALBUTEROL 0.5-2.5 (3) MG/3ML IN SOLN
3.0000 mL | Freq: Once | RESPIRATORY_TRACT | Status: AC
  Administered 2018-01-28: 3 mL via RESPIRATORY_TRACT

## 2018-01-28 MED ORDER — ALBUTEROL SULFATE HFA 108 (90 BASE) MCG/ACT IN AERS: 1.0000 | INHALATION_SPRAY | Freq: Four times a day (QID) | RESPIRATORY_TRACT | 0 refills | Status: DC | PRN

## 2018-01-28 MED ORDER — IPRATROPIUM-ALBUTEROL 0.5-2.5 (3) MG/3ML IN SOLN
RESPIRATORY_TRACT | Status: AC
  Filled 2018-01-28: qty 3

## 2018-01-28 MED ORDER — PREDNISONE 50 MG PO TABS: 50.0000 mg | ORAL_TABLET | Freq: Every day | ORAL | 0 refills | Status: AC

## 2018-01-28 MED ORDER — PREDNISONE 50 MG PO TABS: 50.0000 mg | ORAL_TABLET | Freq: Every day | ORAL | 0 refills | Status: DC

## 2018-01-28 MED ORDER — CETIRIZINE HCL 10 MG PO CAPS: 10.0000 mg | ORAL_CAPSULE | Freq: Every day | ORAL | 0 refills | Status: DC

## 2018-01-28 NOTE — ED Triage Notes (Signed)
Pt here for 2 weeks of cough and congestion. Hx of bronchitis.

## 2018-01-28 NOTE — ED Provider Notes (Signed)
[place]    CSN: [id number] Arrival date & time: 01/28/18  1652     History   Chief Complaint Chief Complaint  Patient presents with  . Cough    HPI Bradley [name] is a 37 y.o. male history of DM type II, 15-pack-year history presenting today for evaluation of cough and shortness of breath.  Patient states that for the past 2 weeks he has had a persistent cough that has not had any improvement.  He has been taking DayQuil and NyQuil without relief.  He does sleep through the night without waking up to cough.  Is also had some congestion and rhinorrhea.  Denies sore throat and ear pain.  Denies any nausea or vomiting.  Denies fevers.  HPI  Past Medical History:  Diagnosis Date  . ADHD   . ADHD (attention deficit hyperactivity disorder)   . Bipolar 1 disorder ([place])   . Bipolar disorder ([place])   . Diabetes mellitus without complication ([place])   . Morbid obesity ([place])   . Obesity   . Panic attack   . Varicose veins of both lower extremities with pain     Patient Active Problem List   Diagnosis Date Noted  . Chronic pain syndrome 05/20/2017  . Bipolar disorder, current episode mixed, moderate ([place]) 03/04/2017  . Varicose veins of both lower extremities with pain 02/18/2017  . Chronic deep vein thrombosis (DVT) of proximal vein of left lower extremity ([place]) 11/12/2016  . DVT (deep venous thrombosis), left 06/19/2016  . Smoking addiction 06/19/2016  . HTN (hypertension) 07/27/2013  . Morbid obesity ([place]) 07/27/2013  . Bipolar 1 disorder, mixed, moderate ([place]) 07/27/2013  . DM2 (diabetes mellitus, type 2) ([place]) 04/26/2013    Past Surgical History:  Procedure Laterality Date  . surgery on meatus as a child         Home Medications    Prior to Admission medications   Medication Sig Start Date End Date Taking? Authorizing Provider  albuterol (PROVENTIL HFA;VENTOLIN HFA) 108 (90 Base) MCG/ACT inhaler Inhale 1-2 puffs into the lungs every 6 (six) hours as  needed for wheezing or shortness of breath. 01/28/18   Tanisha Lutes C, PA-C  azithromycin (ZITHROMAX) 250 MG tablet Take 1 tablet (250 mg total) by mouth daily. Take first 2 tablets together, then 1 every day until finished. 01/28/18   Derran Sear C, PA-C  brompheniramine-pseudoephedrine-DM 30-2-10 MG/5ML syrup Take 5 mLs by mouth 4 (four) times daily as needed. 01/28/18   Ariyan Sinnett C, PA-C  cyclobenzaprine (FLEXERIL) 10 MG tablet Take 1 tablet (10 mg total) by mouth 2 (two) times daily as needed for muscle spasms. 10/13/17   Blanchie Dessert, MD  glipiZIDE (GLUCOTROL) 5 MG tablet Take 1 tablet (5 mg total) by mouth 2 (two) times daily before a meal. 05/20/17   Jegede, Olugbemiga E, MD  glucose monitoring kit (FREESTYLE) monitoring kit 1 each by Does not apply route 4 (four) times daily - after meals and at bedtime. 1 month Diabetic Testing Supplies for QAC-QHS accuchecks. 07/27/13   Thurnell Lose, MD  ibuprofen (ADVIL,MOTRIN) 200 MG tablet Take 800 mg by mouth every 8 (eight) hours as needed for moderate pain.    [provider]  lurasidone (LATUDA) 40 MG TABS tablet Take 1 tablet (40 mg total) by mouth daily with breakfast. 05/20/17   Tresa Garter, MD  Menthol (ICY HOT ADVANCED RELIEF) 7.5 % PTCH Apply 1 patch topically as needed (pain).    [provider]  metFORMIN (GLUCOPHAGE-XR) 500 MG 24 hr tablet TAKE 2 TABLETS WITH BREAKFAST 05/20/17   Tresa Garter, MD  naproxen (NAPROSYN) 500 MG tablet Take 1 tablet (500 mg total) by mouth 2 (two) times daily. 10/13/17   Blanchie Dessert, MD  naproxen sodium (ANAPROX) 220 MG tablet Take 440 mg by mouth 2 (two) times daily with a meal.    [provider]  predniSONE (DELTASONE) 50 MG tablet Take 1 tablet (50 mg total) by mouth daily for 5 days. 01/28/18 02/02/18  Sammy Cassar C, PA-C  traMADol (ULTRAM) 50 MG tablet Take 1 tablet (50 mg total) by mouth every 12 (twelve) hours as needed. 01/11/18 02/10/18  Charlott Rakes, MD    Family History History reviewed. No pertinent family history.  Social History Social History   Tobacco Use  . Smoking status: Current Every Day Smoker    Packs/day: 1.00    Types: Cigarettes  . Smokeless tobacco: Never Used  Substance Use Topics  . Alcohol use: Yes    Comment: Rarely  . Drug use: No     Allergies   Patient has no known allergies.   Review of Systems Review of Systems  Constitutional: Negative for activity change, appetite change, fatigue and fever.  HENT: Positive for congestion, postnasal drip and rhinorrhea. Negative for ear pain, sinus pressure and sore throat.   Eyes: Negative for pain and itching.  Respiratory: Positive for cough, shortness of breath and wheezing.   Cardiovascular: Negative for chest pain.  Gastrointestinal: Negative for abdominal pain, diarrhea, nausea and vomiting.  Musculoskeletal: Negative for myalgias.  Skin: Negative for rash.  Neurological: Negative for dizziness, light-headedness and headaches.     Physical Exam Triage Vital Signs ED Triage Vitals  Enc Vitals Group     BP 01/28/18 1717 129/68     Pulse Rate 01/28/18 1717 88     Resp 01/28/18 1717 18     Temp 01/28/18 1717 98.1 F (36.7 C)     Temp src --      SpO2 01/28/18 1717 96 %     Weight --      Height --      Head Circumference --      Peak Flow --      Pain Score 01/28/18 1716 0     Pain Loc --      Pain Edu? --      Excl. in [place]? --    No data found.  Updated Vital Signs BP 129/68   Pulse 88   Temp 98.1 F (36.7 C)   Resp 18   SpO2 96%   Visual Acuity Right Eye Distance:   Left Eye Distance:   Bilateral Distance:    Right Eye Near:   Left Eye Near:    Bilateral Near:     Physical Exam  Constitutional: He appears well-developed and well-nourished.  HENT:  Head: Normocephalic and atraumatic.  Bilateral TMs nonerythematous, nasal mucosa erythematous clear rhinorrhea present bilaterally, posterior oropharynx  erythematous, no tonsillar enlargement or exudate.  Eyes: Conjunctivae are normal.  Neck: Neck supple.  Cardiovascular: Normal rate and regular rhythm.  No murmur heard. Pulmonary/Chest: Effort normal and breath sounds normal. No respiratory distress.  Inspiratory wheezing and rhonchi, coarse expiratory rhonchi throughout bilateral lung fields  Abdominal: Soft. There is no tenderness.  Musculoskeletal: He exhibits no edema.  Neurological: He is alert.  Skin: Skin is warm and dry.  Psychiatric: He has a normal mood and affect.  Nursing  note and vitals reviewed.    UC Treatments / Results  Labs (all labs ordered are listed, but only abnormal results are displayed) Labs Reviewed - No data to display  EKG None  Radiology No results found.  Procedures Procedures (including critical care time)  Medications Ordered in UC Medications  ipratropium-albuterol (DUONEB) 0.5-2.5 (3) MG/3ML nebulizer solution 3 mL (has no administration in time range)    Initial Impression / Assessment and Plan / UC Course  I have reviewed the triage vital signs and the nursing notes.  Pertinent labs & imaging results that were available during my care of the patient were reviewed by me and considered in my medical decision making (see chart for details).    Patient appears to have bronchitis, possibly acute on chronic given his smoking history. DuoNeb provided in clinic, will treat with prednisone, albuterol inhaler, azithromycin as well as cough syrup.  Will start on daily Zyrtec for congestion. Discussed strict return precautions. Patient verbalized understanding and is agreeable with plan.  Final Clinical Impressions(s) / UC Diagnoses   Final diagnoses:  Acute bronchitis, unspecified organism     Discharge Instructions     Please use albuterol inhaler as needed for shortness of breath and wheezing  Begin prednisone daily with breakfast for the next 5 days  Begin azithromycin-2 tablets  today, 1 tablet for the following 4 days  Use cough syrup provided as needed for cough.   ED Prescriptions    Medication Sig Dispense Auth. Provider   azithromycin (ZITHROMAX) 250 MG tablet Take 1 tablet (250 mg total) by mouth daily. Take first 2 tablets together, then 1 every day until finished. 6 tablet Kraven Calk C, PA-C   albuterol (PROVENTIL HFA;VENTOLIN HFA) 108 (90 Base) MCG/ACT inhaler Inhale 1-2 puffs into the lungs every 6 (six) hours as needed for wheezing or shortness of breath. 1 Inhaler Joclynn Lumb C, PA-C   predniSONE (DELTASONE) 50 MG tablet Take 1 tablet (50 mg total) by mouth daily for 5 days. 5 tablet Marinda Tyer C, PA-C   brompheniramine-pseudoephedrine-DM 30-2-10 MG/5ML syrup Take 5 mLs by mouth 4 (four) times daily as needed. 120 mL Apoorva Bugay C, PA-C     Controlled Substance Prescriptions [place] Controlled Substance Registry consulted? Not Applicable   Janith Lima, [place] 01/28/18 1750

## 2018-01-28 NOTE — Discharge Instructions (Signed)
Please use albuterol inhaler as needed for shortness of breath and wheezing  Begin prednisone daily with breakfast for the next 5 days  Begin azithromycin-2 tablets today, 1 tablet for the following 4 days  Use cough syrup provided as needed for cough.

## 2018-01-29 MED FILL — METFORMIN HCL ER 500 MG TAB: 500 | 30 days supply | Qty: 60 | Fill #6

## 2018-02-09 ENCOUNTER — Ambulatory Visit: Payer: Medicare Other | Attending: Family Medicine | Admitting: Family Medicine

## 2018-02-09 ENCOUNTER — Encounter: Payer: Self-pay | Admitting: Family Medicine

## 2018-02-09 VITALS — BP 129/84 | HR 86 | Temp 97.9°F | Wt 331.8 lb

## 2018-02-09 DIAGNOSIS — Z7984 Long term (current) use of oral hypoglycemic drugs: Secondary | ICD-10-CM | POA: Insufficient documentation

## 2018-02-09 DIAGNOSIS — Z79899 Other long term (current) drug therapy: Secondary | ICD-10-CM | POA: Diagnosis not present

## 2018-02-09 DIAGNOSIS — E119 Type 2 diabetes mellitus without complications: Secondary | ICD-10-CM | POA: Diagnosis present

## 2018-02-09 DIAGNOSIS — E1140 Type 2 diabetes mellitus with diabetic neuropathy, unspecified: Secondary | ICD-10-CM | POA: Diagnosis not present

## 2018-02-09 DIAGNOSIS — G894 Chronic pain syndrome: Secondary | ICD-10-CM | POA: Insufficient documentation

## 2018-02-09 DIAGNOSIS — R140 Abdominal distension (gaseous): Secondary | ICD-10-CM | POA: Diagnosis not present

## 2018-02-09 DIAGNOSIS — L97509 Non-pressure chronic ulcer of other part of unspecified foot with unspecified severity: Secondary | ICD-10-CM

## 2018-02-09 DIAGNOSIS — F3162 Bipolar disorder, current episode mixed, moderate: Secondary | ICD-10-CM | POA: Insufficient documentation

## 2018-02-09 DIAGNOSIS — E1169 Type 2 diabetes mellitus with other specified complication: Secondary | ICD-10-CM | POA: Insufficient documentation

## 2018-02-09 DIAGNOSIS — F909 Attention-deficit hyperactivity disorder, unspecified type: Secondary | ICD-10-CM | POA: Diagnosis not present

## 2018-02-09 DIAGNOSIS — E1149 Type 2 diabetes mellitus with other diabetic neurological complication: Secondary | ICD-10-CM

## 2018-02-09 DIAGNOSIS — Z6841 Body Mass Index (BMI) 40.0 and over, adult: Secondary | ICD-10-CM | POA: Insufficient documentation

## 2018-02-09 DIAGNOSIS — E11621 Type 2 diabetes mellitus with foot ulcer: Secondary | ICD-10-CM

## 2018-02-09 LAB — POCT GLYCOSYLATED HEMOGLOBIN (HGB A1C): HEMOGLOBIN A1C: 7.4

## 2018-02-09 LAB — GLUCOSE, POCT (MANUAL RESULT ENTRY): POC Glucose: 195 mg/dl — AB (ref 70–99)

## 2018-02-09 MED ORDER — TRAMADOL HCL 50 MG PO TABS: 50.0000 mg | ORAL_TABLET | Freq: Two times a day (BID) | ORAL | 0 refills | Status: AC | PRN

## 2018-02-09 MED ORDER — METFORMIN HCL ER 500 MG PO TB24: ORAL_TABLET | ORAL | 3 refills | Status: DC

## 2018-02-09 MED ORDER — PREGABALIN 75 MG PO CAPS: 75.0000 mg | ORAL_CAPSULE | Freq: Two times a day (BID) | ORAL | 3 refills | Status: DC

## 2018-02-09 MED ORDER — GLIPIZIDE 5 MG PO TABS: 5.0000 mg | ORAL_TABLET | Freq: Two times a day (BID) | ORAL | 3 refills | Status: DC

## 2018-02-09 MED FILL — traMADol HCL 50 MG TABS: 50 | 15 days supply | Qty: 30 | Fill #0

## 2018-02-09 MED FILL — glipiZIDE 5 MG TABS: 5 | 30 days supply | Qty: 60 | Fill #0

## 2018-02-09 NOTE — Patient Instructions (Signed)
Diabetic Neuropathy Diabetic neuropathy is a nerve disease or nerve damage that is caused by diabetes mellitus. About half of all people with diabetes mellitus have some form of nerve damage. Nerve damage is more common in those who have had diabetes mellitus for many years and who generally have not had good control of their blood sugar (glucose) level. Diabetic neuropathy is a common complication of diabetes mellitus. There are three common types of diabetic neuropathy and a fourth type that is less common and less understood:  Peripheral neuropathy-This is the most common type of diabetic neuropathy. It causes damage to the nerves of the feet and legs first and then eventually the hands and arms. The damage affects the ability to sense touch.  Autonomic neuropathy-This type causes damage to the autonomic nervous system, which controls the following functions: ? Heartbeat. ? Body temperature. ? Blood pressure. ? Urination. ? Digestion. ? Sweating. ? Sexual function.  Focal neuropathy-Focal neuropathy can be painful and unpredictable and occurs most often in older adults with diabetes mellitus. It involves a specific nerve or one area and often comes on suddenly. It usually does not cause long-term problems.  Radiculoplexus neuropathy- Sometimes called lumbosacral radiculoplexus neuropathy, radiculoplexus neuropathy affects the nerves of the thighs, hips, buttocks, or legs. It is more common in people with type 2 diabetes mellitus and in older men. It is characterized by debilitating pain, weakness, and atrophy, usually in the thigh muscles.  What are the causes? The cause of peripheral, autonomic, and focal neuropathies is diabetes mellitus that is uncontrolled and high glucose levels. The cause of radiculoplexus neuropathy is unknown. However, it is thought to be caused by inflammation related to uncontrolled glucose levels. What are the signs or symptoms? Peripheral Neuropathy Peripheral  neuropathy develops slowly over time. When the nerves of the feet and legs no longer work there may be:  Burning, stabbing, or aching pain in the legs or feet.  Inability to feel pressure or pain in your feet. This can lead to: ? Thick calluses over pressure areas. ? Pressure sores. ? Ulcers.  Foot deformities.  Reduced ability to feel temperature changes.  Muscle weakness.  Autonomic Neuropathy The symptoms of autonomic neuropathy vary depending on which nerves are affected. Symptoms may include:  Problems with digestion, such as: ? Feeling sick to your stomach (nausea). ? Vomiting. ? Bloating. ? Constipation. ? Diarrhea. ? Abdominal pain.  Difficulty with urination. This occurs if you lose your ability to sense when your bladder is full. Problems include: ? Urine leakage (incontinence). ? Inability to empty your bladder completely (retention).  Rapid or irregular heartbeat (palpitations).  Blood pressure drops when you stand up (orthostatic hypotension). When you stand up you may feel: ? Dizzy. ? Weak. ? Faint.  In men, inability to attain and maintain an erection.  In women, vaginal dryness and problems with decreased sexual desire and arousal.  Problems with body temperature regulation.  Increased or decreased sweating.  Focal Neuropathy  Abnormal eye movements or abnormal alignment of both eyes.  Weakness in the wrist.  Foot drop. This results in an inability to lift the foot properly and abnormal walking or foot movement.  Paralysis on one side of your face (Bell palsy).  Chest or abdominal pain. Radiculoplexus Neuropathy  Sudden, severe pain in your hip, thigh, or buttocks.  Weakness and wasting of thigh muscles.  Difficulty rising from a seated position.  Abdominal swelling.  Unexplained weight loss (usually more than 10 lb [4.5 kg]). How is   this diagnosed? Peripheral Neuropathy Your senses may be tested. Sensory function testing can be  done with:  A light touch using a monofilament.  A vibration with tuning fork.  A sharp sensation with a pin prick.  Other tests that can help diagnose neuropathy are:  Nerve conduction velocity. This test checks the transmission of an electrical current through a nerve.  Electromyography. This shows how muscles respond to electrical signals transmitted by nearby nerves.  Quantitative sensory testing. This is used to assess how your nerves respond to vibrations and changes in temperature.  Autonomic Neuropathy Diagnosis is often based on reported symptoms. Tell your health care provider if you experience:  Dizziness.  Constipation.  Diarrhea.  Inappropriate urination or inability to urinate.  Inability to get or maintain an erection.  Tests that may be done include:  Electrocardiography or Holter monitor. These are tests that can help show problems with the heart rate or heart rhythm.  An X-ray exam may be done.  Focal Neuropathy Diagnosis is made based on your symptoms and what your health care provider finds during your exam. Other tests may be done. They may include:  Nerve conduction velocities. This checks the transmission of electrical current through a nerve.  Electromyography. This shows how muscles respond to electrical signals transmitted by nearby nerves.  Quantitative sensory testing. This test is used to assess how your nerves respond to vibration and changes in temperature.  Radiculoplexus Neuropathy  Often the first thing is to eliminate any other issue or problems that might be the cause, as there is no standard test for diagnosis.  X-ray exam of your spine and lumbar region.  Spinal tap to rule out cancer.  MRI to rule out other lesions. How is this treated? Once nerve damage occurs, it cannot be reversed. The goal of treatment is to keep the disease or nerve damage from getting worse and affecting more nerve fibers. Controlling your blood  glucose level is the key. Most people with radiculoplexus neuropathy see at least a partial improvement over time. You will need to keep your blood glucose and HbA1c levels in the target range determined by your health care provider. Things that help control blood glucose levels include:  Blood glucose monitoring.  Meal planning.  Physical activity.  Diabetes medicine.  Over time, maintaining lower blood glucose levels helps lessen symptoms. Sometimes, prescription pain medicine is needed. Follow these instructions at home:  Do not smoke.  Keep your blood glucose level in the range that you and your health care provider have determined acceptable for you.  Keep your blood pressure level in the range that you and your health care provider have determined acceptable for you.  Eat a well-balanced diet.  Be physically active every day. Include strength training and balance exercises.  Protect your feet. ? Check your feet every day for sores, cuts, blisters, or signs of infection. ? Wear padded socks and supportive shoes. Use orthotic inserts, if necessary. ? Regularly check the insides of your shoes for worn spots. Make sure there are no rocks or other items inside your shoes before you put them on. Contact a health care provider if:  You have burning, stabbing, or aching pain in the legs or feet.  You are unable to feel pressure or pain in your feet.  You develop problems with digestion such as: ? Nausea. ? Vomiting. ? Bloating. ? Constipation. ? Diarrhea. ? Abdominal pain.  You have difficulty with urination, such as: ? Incontinence. ? Retention.    You have palpitations.  You develop orthostatic hypotension. When you stand up you may feel: ? Dizzy. ? Weak. ? Faint.  You cannot attain and maintain an erection (in men).  You have vaginal dryness and problems with decreased sexual desire and arousal (in women).  You have severe pain in your thighs, legs, or  buttocks.  You have unexplained weight loss. This information is not intended to replace advice given to you by your health care provider. Make sure you discuss any questions you have with your health care provider. Document Released: 11/24/2001 Document Revised: 02/21/2016 Document Reviewed: 02/24/2013 [employer] Interactive Patient Education  2017 [employer]  

## 2018-02-09 NOTE — Progress Notes (Signed)
Subjective:  Patient ID: Bradley [name], male    DOB: [birth year]  Age: 37 y.o. MRN: [id number]  CC: Diabetes   HPI Bradley [name] is a 37 year old male with a history of morbid obesity, type 2 diabetes mellitus (A1c 7.4, diabetic neuropathy, disorder who presents today to establish care with me. He complains of severe neuropathy in both lower extremities for which he has been on tramadol.  Review of his chart indicates last year he was placed on gabapentin which he states caused him to have crying spells and paranoid delusions feeling someone was coming to get him.  Symptoms resolved after gabapentin was discontinued and neuropathy has been controlled on tramadol. For his bipolar disorder he was previously managed at [facility] but has not been seen there lately as he has had trouble obtaining an appointment and he currently does not take medications. He complains of abdominal bloating and gassiness but denies pain, constipation, nausea or vomiting, reflux.  Past Medical History:  Diagnosis Date  . ADHD   . ADHD (attention deficit hyperactivity disorder)   . Bipolar 1 disorder ([place])   . Bipolar disorder ([place])   . Diabetes mellitus without complication ([place])   . Morbid obesity ([place])   . Obesity   . Panic attack   . Varicose veins of both lower extremities with pain     Past Surgical History:  Procedure Laterality Date  . surgery on meatus as a child      No Known Allergies   Outpatient Medications Prior to Visit  Medication Sig Dispense Refill  . albuterol (PROVENTIL HFA;VENTOLIN HFA) 108 (90 Base) MCG/ACT inhaler Inhale 1-2 puffs into the lungs every 6 (six) hours as needed for wheezing or shortness of breath. 1 Inhaler 0  . glucose monitoring kit (FREESTYLE) monitoring kit 1 each by Does not apply route 4 (four) times daily - after meals and at bedtime. 1 month Diabetic Testing Supplies for QAC-QHS accuchecks. 1 each 1  . ibuprofen (ADVIL,MOTRIN) 200 MG tablet Take 800 mg by  mouth every 8 (eight) hours as needed for moderate pain.    . Menthol (ICY HOT ADVANCED RELIEF) 7.5 % PTCH Apply 1 patch topically as needed (pain).    . naproxen (NAPROSYN) 500 MG tablet Take 1 tablet (500 mg total) by mouth 2 (two) times daily. 30 tablet 0  . naproxen sodium (ANAPROX) 220 MG tablet Take 440 mg by mouth 2 (two) times daily with a meal.    . glipiZIDE (GLUCOTROL) 5 MG tablet Take 1 tablet (5 mg total) by mouth 2 (two) times daily before a meal. 60 tablet 3  . metFORMIN (GLUCOPHAGE-XR) 500 MG 24 hr tablet TAKE 2 TABLETS WITH BREAKFAST 180 tablet 3  . traMADol (ULTRAM) 50 MG tablet Take 1 tablet (50 mg total) by mouth every 12 (twelve) hours as needed. 60 tablet 0  . azithromycin (ZITHROMAX) 250 MG tablet Take 1 tablet (250 mg total) by mouth daily. Take first 2 tablets together, then 1 every day until finished. (Patient not taking: Reported on 02/09/2018) 6 tablet 0  . brompheniramine-pseudoephedrine-DM 30-2-10 MG/5ML syrup Take 5 mLs by mouth 4 (four) times daily as needed. (Patient not taking: Reported on 02/09/2018) 120 mL 0  . Cetirizine HCl 10 MG CAPS Take 1 capsule (10 mg total) by mouth daily for 15 days. (Patient not taking: Reported on 02/09/2018) 15 capsule 0  . cyclobenzaprine (FLEXERIL) 10 MG tablet Take 1 tablet (10 mg total) by mouth 2 (two) times daily as  needed for muscle spasms. (Patient not taking: Reported on 02/09/2018) 20 tablet 0  . lurasidone (LATUDA) 40 MG TABS tablet Take 1 tablet (40 mg total) by mouth daily with breakfast. (Patient not taking: Reported on 02/09/2018) 30 tablet 3   No facility-administered medications prior to visit.     ROS Review of Systems  Constitutional: Negative for activity change and appetite change.  HENT: Negative for sinus pressure and sore throat.   Eyes: Negative for visual disturbance.  Respiratory: Negative for cough, chest tightness and shortness of breath.   Cardiovascular: Negative for chest pain and leg swelling.    Gastrointestinal:       See hpi  Endocrine: Negative.   Genitourinary: Negative for dysuria.  Musculoskeletal: Negative for joint swelling and myalgias.  Skin: Negative for rash.  Allergic/Immunologic: Negative.   Neurological: Positive for numbness. Negative for weakness and light-headedness.  Psychiatric/Behavioral: Negative for dysphoric mood and suicidal ideas.    Objective:  BP 129/84   Pulse 86   Temp 97.9 F (36.6 C) (Oral)   Wt (!) 331 lb 12.8 oz (150.5 kg)   SpO2 96%   BMI 45.00 kg/m   BP/Weight 02/09/2018 01/28/2018 0/27/2536  Systolic BP 644 034 742  Diastolic BP 84 68 84  Wt. (Lbs) 331.8 - 324  BMI 45 - 43.94  Some encounter information is confidential and restricted. Go to Review Flowsheets activity to see all data.     Physical Exam  Constitutional: He is oriented to person, place, and time. He appears well-developed and well-nourished.  Morbid obesity  Cardiovascular: Normal rate, normal heart sounds and intact distal pulses.  No murmur heard. Pulmonary/Chest: Effort normal and breath sounds normal. He has no wheezes. He has no rales. He exhibits no tenderness.  Abdominal: Soft. Bowel sounds are normal. He exhibits no mass. There is no guarding.  Musculoskeletal: Normal range of motion.  Neurological: He is alert and oriented to person, place, and time.  Hyperesthesia in both lower extremities  Skin: Skin is warm and dry.  Psychiatric: He has a normal mood and affect.     Lab Results  Component Value Date   HGBA1C 7.4 02/09/2018    Assessment & Plan:   1. Type 2 diabetes mellitus with other neurologic complication, without long-term current use of insulin (HCC) Previously placed on gabapentin  which he did not tolerate-had crying spells and paranoid delusions  placed on Lyrica and we will perform prior authorization Advised that tramadol is not the standard of care for treatment of diabetic neuropathy but he has been on  This for the past 1 year  and I will give him a few tablets until Lyrica is approved - POCT glucose (manual entry) - POCT glycosylated hemoglobin (Hb A1C) - traMADol (ULTRAM) 50 MG tablet; Take 1 tablet (50 mg total) by mouth every 12 (twelve) hours as needed.  Dispense: 30 tablet; Refill: 0  2. Type 2 diabetes mellitus with other specified complication, without long-term current use of insulin (HCC) Previously placed on gabapentin  which he did not tolerate-had crying spells and paranoid delusions  placed on Lyrica and will perform prior authorization Advised that tramadol is not the standard of care for treatment of diabetic neuropathy but he has been on  This from by his PCP from the past 1 year and I will give him a few tablets until Lyrica is approved - glipiZIDE (GLUCOTROL) 5 MG tablet; Take 1 tablet (5 mg total) by mouth 2 (two) times daily before a  meal.  Dispense: 60 tablet; Refill: 3 - metFORMIN (GLUCOPHAGE-XR) 500 MG 24 hr tablet; TAKE 2 TABLETS WITH BREAKFAST  Dispense: 180 tablet; Refill: 3  3. Chronic pain syndrome Secondary to diabetic neuropathy  4. Bipolar 1 disorder, mixed, moderate (HCC) Stable Previously followed by [facility] but was lost to follow-up and currently does not take medications  5. Morbid obesity ([place]) Reduce portion sizes, avoid late meals, exercise for 150 minutes of moderate intensity exercise per week  6.  Abdominal bloating We will perform H. Pylori  Meds ordered this encounter  Medications  . glipiZIDE (GLUCOTROL) 5 MG tablet    Sig: Take 1 tablet (5 mg total) by mouth 2 (two) times daily before a meal.    Dispense:  60 tablet    Refill:  3  . metFORMIN (GLUCOPHAGE-XR) 500 MG 24 hr tablet    Sig: TAKE 2 TABLETS WITH BREAKFAST    Dispense:  180 tablet    Refill:  3  . pregabalin (LYRICA) 75 MG capsule    Sig: Take 1 capsule (75 mg total) by mouth 2 (two) times daily.    Dispense:  60 capsule    Refill:  3  . traMADol (ULTRAM) 50 MG tablet    Sig: Take 1 tablet (50 mg  total) by mouth every 12 (twelve) hours as needed.    Dispense:  30 tablet    Refill:  0    Follow-up: Return in about 3 months (around 05/12/2018) for follow up of chronic medical conditions.   Charlott Rakes MD

## 2018-02-10 ENCOUNTER — Encounter: Payer: Self-pay | Admitting: Family Medicine

## 2018-02-10 LAB — H. PYLORI BREATH TEST: H PYLORI BREATH TEST: NEGATIVE

## 2018-02-11 ENCOUNTER — Telehealth: Payer: Self-pay

## 2018-02-11 NOTE — Telephone Encounter (Signed)
Patient was called and informed of lab results. 

## 2018-02-11 NOTE — Telephone Encounter (Signed)
-----   Message from Hoy Register, MD sent at 02/11/2018  2:12 PM EDT ----- Please inform the patient that labs are normal. Thank you.

## 2018-02-23 ENCOUNTER — Telehealth: Payer: Self-pay | Admitting: Internal Medicine

## 2018-02-23 ENCOUNTER — Other Ambulatory Visit: Payer: Self-pay | Admitting: Family Medicine

## 2018-02-23 MED ORDER — PREGABALIN 75 MG PO CAPS: 75.0000 mg | ORAL_CAPSULE | Freq: Two times a day (BID) | ORAL | 3 refills | Status: DC

## 2018-02-23 NOTE — Telephone Encounter (Signed)
Patient called about a PA on Lyrica. Please call the patient back when you get a min.

## 2018-02-26 ENCOUNTER — Telehealth: Payer: Self-pay | Admitting: Internal Medicine

## 2018-02-26 NOTE — Telephone Encounter (Signed)
Patient called stating that the lyrica was causing nausea and muscle weakness. Please fu with patient.

## 2018-03-02 NOTE — Telephone Encounter (Signed)
Will route to PCP 

## 2018-03-02 NOTE — Telephone Encounter (Signed)
Okay to DC Lyrica

## 2018-03-03 ENCOUNTER — Telehealth: Payer: Self-pay | Admitting: Family Medicine

## 2018-03-03 MED FILL — METFORMIN HCL ER 500 MG TAB: 500 | 30 days supply | Qty: 60 | Fill #7

## 2018-03-03 NOTE — Telephone Encounter (Signed)
Will route to PCP 

## 2018-03-03 NOTE — Telephone Encounter (Signed)
Patient was called and informed to DC lyrica.

## 2018-03-03 NOTE — Telephone Encounter (Signed)
Patient said that he feels better and he wants to keep taken the medication.

## 2018-03-04 NOTE — Telephone Encounter (Signed)
Noted  

## 2018-04-05 MED FILL — METFORMIN HCL ER 500 MG TAB: 500 | 30 days supply | Qty: 60 | Fill #8

## 2018-05-04 MED FILL — glipiZIDE 5 MG TABS: 5 | 30 days supply | Qty: 60 | Fill #1

## 2018-05-04 MED FILL — METFORMIN HCL ER 500 MG TAB: 500 | 30 days supply | Qty: 60 | Fill #9

## 2018-05-12 ENCOUNTER — Ambulatory Visit: Payer: Medicare Other | Attending: Family Medicine | Admitting: Family Medicine

## 2018-05-12 ENCOUNTER — Encounter: Payer: Self-pay | Admitting: Family Medicine

## 2018-05-12 VITALS — BP 105/71 | HR 86 | Temp 98.1°F | Ht 72.0 in | Wt 352.6 lb

## 2018-05-12 DIAGNOSIS — Z7984 Long term (current) use of oral hypoglycemic drugs: Secondary | ICD-10-CM | POA: Diagnosis not present

## 2018-05-12 DIAGNOSIS — F410 Panic disorder [episodic paroxysmal anxiety] without agoraphobia: Secondary | ICD-10-CM | POA: Diagnosis not present

## 2018-05-12 DIAGNOSIS — Z888 Allergy status to other drugs, medicaments and biological substances status: Secondary | ICD-10-CM | POA: Insufficient documentation

## 2018-05-12 DIAGNOSIS — F3162 Bipolar disorder, current episode mixed, moderate: Secondary | ICD-10-CM | POA: Diagnosis not present

## 2018-05-12 DIAGNOSIS — E1169 Type 2 diabetes mellitus with other specified complication: Secondary | ICD-10-CM | POA: Insufficient documentation

## 2018-05-12 DIAGNOSIS — Z6841 Body Mass Index (BMI) 40.0 and over, adult: Secondary | ICD-10-CM | POA: Insufficient documentation

## 2018-05-12 DIAGNOSIS — F909 Attention-deficit hyperactivity disorder, unspecified type: Secondary | ICD-10-CM | POA: Diagnosis not present

## 2018-05-12 DIAGNOSIS — Z79899 Other long term (current) drug therapy: Secondary | ICD-10-CM | POA: Diagnosis not present

## 2018-05-12 DIAGNOSIS — E1140 Type 2 diabetes mellitus with diabetic neuropathy, unspecified: Secondary | ICD-10-CM | POA: Diagnosis not present

## 2018-05-12 DIAGNOSIS — E1149 Type 2 diabetes mellitus with other diabetic neurological complication: Secondary | ICD-10-CM | POA: Diagnosis not present

## 2018-05-12 LAB — POCT GLYCOSYLATED HEMOGLOBIN (HGB A1C): HbA1c, POC (controlled diabetic range): 7.7 % — AB (ref 0.0–7.0)

## 2018-05-12 LAB — GLUCOSE, POCT (MANUAL RESULT ENTRY): POC GLUCOSE: 172 mg/dL — AB (ref 70–99)

## 2018-05-12 MED ORDER — TRAMADOL HCL 50 MG PO TABS: 50.0000 mg | ORAL_TABLET | Freq: Two times a day (BID) | ORAL | 2 refills | Status: DC | PRN

## 2018-05-12 MED ORDER — GLIPIZIDE 5 MG PO TABS: 5.0000 mg | ORAL_TABLET | Freq: Two times a day (BID) | ORAL | 3 refills | Status: DC

## 2018-05-12 MED ORDER — METFORMIN HCL ER 500 MG PO TB24: ORAL_TABLET | ORAL | 3 refills | Status: DC

## 2018-05-12 MED FILL — traMADol HCL 50 MG TABS: 50 | 30 days supply | Qty: 60 | Fill #0

## 2018-05-12 NOTE — Progress Notes (Signed)
Subjective:  Patient ID: Bradley [name], male    DOB: 02-12-1981  Age: 37 y.o. MRN: [id number]  CC: Diabetes Mellitus  HPI Bradley [name] is a 37 year old male with a history of morbid obesity, type 2 diabetes mellitus (A1c 7.7), diabetic neuropathy, Bipolar disorder who presents today to establish care with me. His A1c is 7.7 which has trended up from 7.4 previously and he endorses taking glipizide once daily rather than twice daily as prescribed.  Denies blurry vision but suffers from diabetic neuropathy for which he is on Lyrica but he complains of excessive somnolence as an hour after taking it he falls asleep regardless of where he is.  Was previously on Gabapentin which caused crying spells and had to be discontinued and he took tramadol in the past from his previous PCP which controlled his symptoms. He does not exercise regularly but informs me he tries to adhere to a diabetic diet. Currently does not take medications for bipolar disorder.  Past Medical History:  Diagnosis Date  . ADHD   . ADHD (attention deficit hyperactivity disorder)   . Bipolar 1 disorder ([place])   . Bipolar disorder ([place])   . Diabetes mellitus without complication ([place])   . Morbid obesity ([place])   . Obesity   . Panic attack   . Varicose veins of both lower extremities with pain     Past Surgical History:  Procedure Laterality Date  . surgery on meatus as a child      Allergies  Allergen Reactions  . Gabapentin     Crying spells  . Lyrica [Pregabalin]     somnolence     Outpatient Medications Prior to Visit  Medication Sig Dispense Refill  . albuterol (PROVENTIL HFA;VENTOLIN HFA) 108 (90 Base) MCG/ACT inhaler Inhale 1-2 puffs into the lungs every 6 (six) hours as needed for wheezing or shortness of breath. 1 Inhaler 0  . glucose monitoring kit (FREESTYLE) monitoring kit 1 each by Does not apply route 4 (four) times daily - after meals and at bedtime. 1 month Diabetic Testing Supplies for  QAC-QHS accuchecks. 1 each 1  . ibuprofen (ADVIL,MOTRIN) 200 MG tablet Take 800 mg by mouth every 8 (eight) hours as needed for moderate pain.    . Menthol (ICY HOT ADVANCED RELIEF) 7.5 % PTCH Apply 1 patch topically as needed (pain).    Marland Kitchen glipiZIDE (GLUCOTROL) 5 MG tablet Take 1 tablet (5 mg total) by mouth 2 (two) times daily before a meal. 60 tablet 3  . metFORMIN (GLUCOPHAGE-XR) 500 MG 24 hr tablet TAKE 2 TABLETS WITH BREAKFAST 180 tablet 3  . pregabalin (LYRICA) 75 MG capsule Take 1 capsule (75 mg total) by mouth 2 (two) times daily. 60 capsule 3  . azithromycin (ZITHROMAX) 250 MG tablet Take 1 tablet (250 mg total) by mouth daily. Take first 2 tablets together, then 1 every day until finished. (Patient not taking: Reported on 02/09/2018) 6 tablet 0  . brompheniramine-pseudoephedrine-DM 30-2-10 MG/5ML syrup Take 5 mLs by mouth 4 (four) times daily as needed. (Patient not taking: Reported on 02/09/2018) 120 mL 0  . Cetirizine HCl 10 MG CAPS Take 1 capsule (10 mg total) by mouth daily for 15 days. (Patient not taking: Reported on 02/09/2018) 15 capsule 0  . cyclobenzaprine (FLEXERIL) 10 MG tablet Take 1 tablet (10 mg total) by mouth 2 (two) times daily as needed for muscle spasms. (Patient not taking: Reported on 02/09/2018) 20 tablet 0  . lurasidone (LATUDA) 40 MG TABS tablet  Take 1 tablet (40 mg total) by mouth daily with breakfast. (Patient not taking: Reported on 02/09/2018) 30 tablet 3  . naproxen (NAPROSYN) 500 MG tablet Take 1 tablet (500 mg total) by mouth 2 (two) times daily. (Patient not taking: Reported on 05/12/2018) 30 tablet 0  . naproxen sodium (ANAPROX) 220 MG tablet Take 440 mg by mouth 2 (two) times daily with a meal.     No facility-administered medications prior to visit.     ROS Review of Systems  Constitutional: Negative for activity change and appetite change.  HENT: Negative for sinus pressure and sore throat.   Eyes: Negative for visual disturbance.  Respiratory: Negative  for cough, chest tightness and shortness of breath.   Cardiovascular: Negative for chest pain and leg swelling.  Gastrointestinal: Negative for abdominal distention, abdominal pain, constipation and diarrhea.  Endocrine: Negative.   Genitourinary: Negative for dysuria.  Musculoskeletal: Negative for joint swelling and myalgias.  Skin: Negative for rash.  Allergic/Immunologic: Negative.   Neurological: Positive for numbness. Negative for weakness and light-headedness.  Psychiatric/Behavioral: Negative for dysphoric mood and suicidal ideas.    Objective:  BP 105/71   Pulse 86   Temp 98.1 F (36.7 C) (Oral)   Ht 6' (1.829 m)   Wt (!) 352 lb 9.6 oz (159.9 kg)   SpO2 99%   BMI 47.82 kg/m   BP/Weight 05/12/2018 9/56/3875 03/02/3328  Systolic BP 518 841 660  Diastolic BP 71 84 68  Wt. (Lbs) 352.6 331.8 -  BMI 47.82 45 -  Some encounter information is confidential and restricted. Go to Review Flowsheets activity to see all data.      Physical Exam  Constitutional: He is oriented to person, place, and time. He appears well-developed and well-nourished.  Morbidly obese  Cardiovascular: Normal rate, normal heart sounds and intact distal pulses.  No murmur heard. Pulmonary/Chest: Effort normal and breath sounds normal. He has no wheezes. He has no rales. He exhibits no tenderness.  Abdominal: Soft. Bowel sounds are normal. He exhibits no distension and no mass. There is no tenderness.  Musculoskeletal: Normal range of motion.  Neurological: He is alert and oriented to person, place, and time.  Skin: Skin is warm and dry.  Psychiatric: He has a normal mood and affect.    CMP Latest Ref Rng & Units 10/13/2017 06/14/2017 11/19/2016  Glucose 65 - 99 mg/dL 115(H) 93 352(H)  BUN 6 - 20 mg/dL 10 17 5(L)  Creatinine 0.61 - 1.24 mg/dL 0.87 0.66 0.72  Sodium 135 - 145 mmol/L 139 138 138  Potassium 3.5 - 5.1 mmol/L 4.8 3.9 4.1  Chloride 101 - 111 mmol/L 102 105 102  CO2 22 - 32 mmol/L '26 25  26  '$ Calcium 8.9 - 10.3 mg/dL 9.3 8.8(L) 9.1  Total Protein 6.5 - 8.1 g/dL 7.1 6.5 -  Total Bilirubin 0.3 - 1.2 mg/dL 0.9 0.7 -  Alkaline Phos 38 - 126 U/L 55 44 -  AST 15 - 41 U/L 19 21 -  ALT 17 - 63 U/L 22 21 -    Lipid Panel     Component Value Date/Time   CHOL 153 11/12/2016 1613   TRIG 142 11/12/2016 1613   HDL 38 (L) 11/12/2016 1613   CHOLHDL 4.0 11/12/2016 1613   VLDL 28 11/12/2016 1613   LDLCALC 87 11/12/2016 1613    Lab Results  Component Value Date   HGBA1C 7.7 (A) 05/12/2018    Assessment & Plan:   1. Type 2 diabetes mellitus  with other specified complication, without long-term current use of insulin ([place]) Not at goal with A1c of 7.7 which has trended up from 7.4 previously He attributes this to taking glipizide once daily rather than twice daily and has been advised to resume twice daily dosing We have discussed Victoza due to weight loss and cardiovascular benefits however he is not inclined to commencing it at this time. Counseled on Diabetic diet, my plate method, 511 minutes of moderate intensity exercise/week Keep blood sugar logs with fasting goals of 80-120 mg/dl, random of less than 180 and in the event of sugars less than 60 mg/dl or greater than 400 mg/dl please notify the clinic ASAP. It is recommended that you undergo annual eye exams and annual foot exams. Pneumonia vaccine is recommended. - POCT glucose (manual entry) - POCT glycosylated hemoglobin (Hb A1C) - glipiZIDE (GLUCOTROL) 5 MG tablet; Take 1 tablet (5 mg total) by mouth 2 (two) times daily before a meal.  Dispense: 60 tablet; Refill: 3 - metFORMIN (GLUCOPHAGE-XR) 500 MG 24 hr tablet; TAKE 2 TABLETS WITH BREAKFAST  Dispense: 60 tablet; Refill: 3 - Microalbumin/Creatinine Ratio, Urine; Future - CMP14+EGFR; Future - Lipid panel; Future  2. Morbid obesity ([place]) Discussed the need to exercise, reduce portion sizes with the aim of losing weight  3. Bipolar 1 disorder, mixed, moderate  (HCC) Currently not on medications  4. Other diabetic neurological complication associated with type 2 diabetes mellitus ([place]) Uncontrolled Unable to tolerate gabapentin due to crying spells Lyrica helps somewhat but causes excessive somnolence He was previously on tramadol with his previous PCP and experienced no side effects. We have signed a controlled substances agreement today and I will place him back on tramadol. - traMADol (ULTRAM) 50 MG tablet; Take 1 tablet (50 mg total) by mouth every 12 (twelve) hours as needed.  Dispense: 60 tablet; Refill: 2   Meds ordered this encounter  Medications  . traMADol (ULTRAM) 50 MG tablet    Sig: Take 1 tablet (50 mg total) by mouth every 12 (twelve) hours as needed.    Dispense:  60 tablet    Refill:  2    Continue Lyrica  . glipiZIDE (GLUCOTROL) 5 MG tablet    Sig: Take 1 tablet (5 mg total) by mouth 2 (two) times daily before a meal.    Dispense:  60 tablet    Refill:  3  . metFORMIN (GLUCOPHAGE-XR) 500 MG 24 hr tablet    Sig: TAKE 2 TABLETS WITH BREAKFAST    Dispense:  60 tablet    Refill:  3    Follow-up: Return in about 3 months (around 08/12/2018) for Follow-up of chronic medical conditions.   Charlott Rakes MD

## 2018-05-12 NOTE — Patient Instructions (Signed)
Diabetes Mellitus and Nutrition When you have diabetes (diabetes mellitus), it is very important to have healthy eating habits because your blood sugar (glucose) levels are greatly affected by what you eat and drink. Eating healthy foods in the appropriate amounts, at about the same times every day, can help you:  Control your blood glucose.  Lower your risk of heart disease.  Improve your blood pressure.  Reach or maintain a healthy weight.  Every person with diabetes is different, and each person has different needs for a meal plan. Your health care provider may recommend that you work with a diet and nutrition specialist (dietitian) to make a meal plan that is best for you. Your meal plan may vary depending on factors such as:  The calories you need.  The medicines you take.  Your weight.  Your blood glucose, blood pressure, and cholesterol levels.  Your activity level.  Other health conditions you have, such as heart or kidney disease.  How do carbohydrates affect me? Carbohydrates affect your blood glucose level more than any other type of food. Eating carbohydrates naturally increases the amount of glucose in your blood. Carbohydrate counting is a method for keeping track of how many carbohydrates you eat. Counting carbohydrates is important to keep your blood glucose at a healthy level, especially if you use insulin or take certain oral diabetes medicines. It is important to know how many carbohydrates you can safely have in each meal. This is different for every person. Your dietitian can help you calculate how many carbohydrates you should have at each meal and for snack. Foods that contain carbohydrates include:  Bread, cereal, rice, pasta, and crackers.  Potatoes and corn.  Peas, beans, and lentils.  Milk and yogurt.  Fruit and juice.  Desserts, such as cakes, cookies, ice cream, and candy.  How does alcohol affect me? Alcohol can cause a sudden decrease in blood  glucose (hypoglycemia), especially if you use insulin or take certain oral diabetes medicines. Hypoglycemia can be a life-threatening condition. Symptoms of hypoglycemia (sleepiness, dizziness, and confusion) are similar to symptoms of having too much alcohol. If your health care provider says that alcohol is safe for you, follow these guidelines:  Limit alcohol intake to no more than 1 drink per day for nonpregnant women and 2 drinks per day for men. One drink equals 12 oz of beer, 5 oz of wine, or 1 oz of hard liquor.  Do not drink on an empty stomach.  Keep yourself hydrated with water, diet soda, or unsweetened iced tea.  Keep in mind that regular soda, juice, and other mixers may contain a lot of sugar and must be counted as carbohydrates.  What are tips for following this plan? Reading food labels  Start by checking the serving size on the label. The amount of calories, carbohydrates, fats, and other nutrients listed on the label are based on one serving of the food. Many foods contain more than one serving per package.  Check the total grams (g) of carbohydrates in one serving. You can calculate the number of servings of carbohydrates in one serving by dividing the total carbohydrates by 15. For example, if a food has 30 g of total carbohydrates, it would be equal to 2 servings of carbohydrates.  Check the number of grams (g) of saturated and trans fats in one serving. Choose foods that have low or no amount of these fats.  Check the number of milligrams (mg) of sodium in one serving. Most people   should limit total sodium intake to less than 2,300 mg per day.  Always check the nutrition information of foods labeled as "low-fat" or "nonfat". These foods may be higher in added sugar or refined carbohydrates and should be avoided.  Talk to your dietitian to identify your daily goals for nutrients listed on the label. Shopping  Avoid buying canned, premade, or processed foods. These  foods tend to be high in fat, sodium, and added sugar.  Shop around the outside edge of the grocery store. This includes fresh fruits and vegetables, bulk grains, fresh meats, and fresh dairy. Cooking  Use low-heat cooking methods, such as baking, instead of high-heat cooking methods like deep frying.  Cook using healthy oils, such as olive, canola, or sunflower oil.  Avoid cooking with butter, cream, or high-fat meats. Meal planning  Eat meals and snacks regularly, preferably at the same times every day. Avoid going long periods of time without eating.  Eat foods high in fiber, such as fresh fruits, vegetables, beans, and whole grains. Talk to your dietitian about how many servings of carbohydrates you can eat at each meal.  Eat 4-6 ounces of lean protein each day, such as lean meat, chicken, fish, eggs, or tofu. 1 ounce is equal to 1 ounce of meat, chicken, or fish, 1 egg, or 1/4 cup of tofu.  Eat some foods each day that contain healthy fats, such as avocado, nuts, seeds, and fish. Lifestyle   Check your blood glucose regularly.  Exercise at least 30 minutes 5 or more days each week, or as told by your health care provider.  Take medicines as told by your health care provider.  Do not use any products that contain nicotine or tobacco, such as cigarettes and e-cigarettes. If you need help quitting, ask your health care provider.  Work with a counselor or diabetes educator to identify strategies to manage stress and any emotional and social challenges. What are some questions to ask my health care provider?  Do I need to meet with a diabetes educator?  Do I need to meet with a dietitian?  What number can I call if I have questions?  When are the best times to check my blood glucose? Where to find more information:  American Diabetes Association: diabetes.org/food-and-fitness/food  Academy of Nutrition and Dietetics:  www.eatright.org/resources/health/diseases-and-conditions/diabetes  National Institute of Diabetes and Digestive and Kidney Diseases (NIH): www.niddk.nih.gov/health-information/diabetes/overview/diet-eating-physical-activity Summary  A healthy meal plan will help you control your blood glucose and maintain a healthy lifestyle.  Working with a diet and nutrition specialist (dietitian) can help you make a meal plan that is best for you.  Keep in mind that carbohydrates and alcohol have immediate effects on your blood glucose levels. It is important to count carbohydrates and to use alcohol carefully. This information is not intended to replace advice given to you by your health care provider. Make sure you discuss any questions you have with your health care provider. Document Released: 06/12/2005 Document Revised: 10/20/2016 Document Reviewed: 10/20/2016 Elsevier Interactive Patient Education  2018 Elsevier Inc.  

## 2018-05-17 ENCOUNTER — Ambulatory Visit: Payer: Medicare Other | Attending: Family Medicine

## 2018-05-17 DIAGNOSIS — E1169 Type 2 diabetes mellitus with other specified complication: Secondary | ICD-10-CM | POA: Insufficient documentation

## 2018-05-17 NOTE — Progress Notes (Signed)
Patient here for lab visit only 

## 2018-05-18 LAB — CMP14+EGFR
A/G RATIO: 1.4 (ref 1.2–2.2)
ALT: 22 IU/L (ref 0–44)
AST: 20 IU/L (ref 0–40)
Albumin: 3.9 g/dL (ref 3.5–5.5)
Alkaline Phosphatase: 64 IU/L (ref 39–117)
BILIRUBIN TOTAL: 0.6 mg/dL (ref 0.0–1.2)
BUN/Creatinine Ratio: 12 (ref 9–20)
BUN: 9 mg/dL (ref 6–20)
CHLORIDE: 101 mmol/L (ref 96–106)
CO2: 21 mmol/L (ref 20–29)
Calcium: 9.2 mg/dL (ref 8.7–10.2)
Creatinine, Ser: 0.78 mg/dL (ref 0.76–1.27)
GFR calc Af Amer: 133 mL/min/{1.73_m2} (ref >59)
GFR calc non Af Amer: 115 mL/min/{1.73_m2} (ref >59)
GLUCOSE: 131 mg/dL — AB (ref 65–99)
Globulin, Total: 2.7 g/dL (ref 1.5–4.5)
POTASSIUM: 4.1 mmol/L (ref 3.5–5.2)
Sodium: 141 mmol/L (ref 134–144)
Total Protein: 6.6 g/dL (ref 6.0–8.5)

## 2018-05-18 LAB — LIPID PANEL
CHOL/HDL RATIO: 3.8 ratio (ref 0.0–5.0)
Cholesterol, Total: 144 mg/dL (ref 100–199)
HDL: 38 mg/dL — AB (ref >39)
LDL CALC: 79 mg/dL (ref 0–99)
TRIGLYCERIDES: 135 mg/dL (ref 0–149)
VLDL Cholesterol Cal: 27 mg/dL (ref 5–40)

## 2018-05-20 ENCOUNTER — Telehealth: Payer: Self-pay

## 2018-05-20 NOTE — Telephone Encounter (Signed)
Patient was called and informed of lab results. 

## 2018-05-20 NOTE — Telephone Encounter (Signed)
-----   Message from Hoy RegisterEnobong Newlin, MD sent at 05/19/2018  9:42 AM EDT ----- Please inform the patient that labs are normal. Thank you.

## 2018-06-04 MED FILL — METFORMIN HCL ER 500 MG TAB: 500 | 30 days supply | Qty: 60 | Fill #0

## 2018-06-09 MED FILL — traMADol HCL 50 MG TABS: 50 | 30 days supply | Qty: 60 | Fill #1

## 2018-06-17 MED FILL — glipiZIDE 5 MG TABS: 5 | 30 days supply | Qty: 60 | Fill #2

## 2018-07-06 MED FILL — glipiZIDE 5 MG TABS: 5 | 30 days supply | Qty: 60 | Fill #2

## 2018-07-06 MED FILL — METFORMIN HCL ER 500 MG TAB: 500 | 30 days supply | Qty: 60 | Fill #1

## 2018-07-09 MED FILL — traMADol HCL 50 MG TABS: 50 | 30 days supply | Qty: 60 | Fill #2

## 2018-08-10 MED FILL — METFORMIN HCL ER 500 MG TAB: 500 | 30 days supply | Qty: 60 | Fill #2

## 2018-08-12 ENCOUNTER — Ambulatory Visit: Payer: Medicare Other | Attending: Family Medicine | Admitting: Family Medicine

## 2018-08-12 ENCOUNTER — Encounter: Payer: Self-pay | Admitting: Family Medicine

## 2018-08-12 VITALS — BP 137/88 | HR 78 | Temp 97.3°F | Ht 72.0 in | Wt 366.4 lb

## 2018-08-12 DIAGNOSIS — J0110 Acute frontal sinusitis, unspecified: Secondary | ICD-10-CM | POA: Diagnosis not present

## 2018-08-12 DIAGNOSIS — J3489 Other specified disorders of nose and nasal sinuses: Secondary | ICD-10-CM | POA: Diagnosis present

## 2018-08-12 DIAGNOSIS — Z79899 Other long term (current) drug therapy: Secondary | ICD-10-CM | POA: Insufficient documentation

## 2018-08-12 DIAGNOSIS — Z791 Long term (current) use of non-steroidal anti-inflammatories (NSAID): Secondary | ICD-10-CM | POA: Insufficient documentation

## 2018-08-12 DIAGNOSIS — R0981 Nasal congestion: Secondary | ICD-10-CM | POA: Diagnosis present

## 2018-08-12 DIAGNOSIS — E1169 Type 2 diabetes mellitus with other specified complication: Secondary | ICD-10-CM | POA: Diagnosis not present

## 2018-08-12 DIAGNOSIS — Z6841 Body Mass Index (BMI) 40.0 and over, adult: Secondary | ICD-10-CM | POA: Insufficient documentation

## 2018-08-12 DIAGNOSIS — E1149 Type 2 diabetes mellitus with other diabetic neurological complication: Secondary | ICD-10-CM | POA: Diagnosis not present

## 2018-08-12 DIAGNOSIS — Z79891 Long term (current) use of opiate analgesic: Secondary | ICD-10-CM | POA: Diagnosis not present

## 2018-08-12 DIAGNOSIS — E1165 Type 2 diabetes mellitus with hyperglycemia: Secondary | ICD-10-CM | POA: Diagnosis not present

## 2018-08-12 DIAGNOSIS — Z7984 Long term (current) use of oral hypoglycemic drugs: Secondary | ICD-10-CM | POA: Diagnosis not present

## 2018-08-12 DIAGNOSIS — G894 Chronic pain syndrome: Secondary | ICD-10-CM | POA: Diagnosis not present

## 2018-08-12 LAB — GLUCOSE, POCT (MANUAL RESULT ENTRY): POC Glucose: 186 mg/dl — AB (ref 70–99)

## 2018-08-12 LAB — POCT GLYCOSYLATED HEMOGLOBIN (HGB A1C): Hemoglobin A1C: 8.1 % — AB (ref 4.0–5.6)

## 2018-08-12 MED ORDER — TRAMADOL HCL 50 MG PO TABS: 50.0000 mg | ORAL_TABLET | Freq: Two times a day (BID) | ORAL | 2 refills | Status: DC | PRN

## 2018-08-12 MED ORDER — LIRAGLUTIDE 18 MG/3ML ~~LOC~~ SOPN: PEN_INJECTOR | SUBCUTANEOUS | 3 refills | Status: DC

## 2018-08-12 MED ORDER — METFORMIN HCL ER 500 MG PO TB24: ORAL_TABLET | ORAL | 3 refills | Status: DC

## 2018-08-12 MED ORDER — PNEUMOCOCCAL VAC POLYVALENT 25 MCG/0.5ML IJ INJ: 0.5000 mL | INJECTION | Freq: Once | INTRAMUSCULAR | 0 refills | Status: AC

## 2018-08-12 MED FILL — VICTOZA 18 MG/3 ML INJECT P: 18 | 30 days supply | Qty: 9 | Fill #0

## 2018-08-12 MED FILL — traMADol HCL 50 MG TABS: 50 | 30 days supply | Qty: 60 | Fill #0

## 2018-08-12 NOTE — Progress Notes (Signed)
Subjective:  Patient ID: Bradley [name], male    DOB: 10/08/1980  Age: 37 y.o. MRN: [id number]  CC: Diabetes   HPI Bradley [name]  is a 37 year old male with a history of morbid obesity, type 2 diabetes mellitus (A1c 8.1), diabetic neuropathy, Bipolar disorder who presents today for follow-up visit. His A1c is 8.1 which has trended up from 7.7 previously and he had declined initiation of Victoza at his last visit but is willing to consider this today.  He endorses compliance with metformin and glipizide and denies visual concerns (not up-to-date on annual eye exam) but does have diabetic neuropathy which is uncontrolled on current dose of tramadol and he is wondering if he can increase the frequency to 3 times daily.  He tried gabapentin and Lyrica in the past but was unable to tolerate them. He currently is not on medications for his bipolar disorder reports doing well. He he has had nasal congestion at night, rhinorrhea in the mornings, frontal sinus pressure but denies fever and has used DayQuil and NyQuil which she states usually helps his symptoms.  Denies dyspnea, chest pains, wheezing.  Past Medical History:  Diagnosis Date  . ADHD   . ADHD (attention deficit hyperactivity disorder)   . Bipolar 1 disorder ([place])   . Bipolar disorder ([place])   . Diabetes mellitus without complication ([place])   . Morbid obesity ([place])   . Obesity   . Panic attack   . Varicose veins of both lower extremities with pain     Past Surgical History:  Procedure Laterality Date  . surgery on meatus as a child      Allergies  Allergen Reactions  . Gabapentin     Crying spells  . Lyrica [Pregabalin]     somnolence     Outpatient Medications Prior to Visit  Medication Sig Dispense Refill  . albuterol (PROVENTIL HFA;VENTOLIN HFA) 108 (90 Base) MCG/ACT inhaler Inhale 1-2 puffs into the lungs every 6 (six) hours as needed for wheezing or shortness of breath. 1 Inhaler 0  . glucose monitoring kit  (FREESTYLE) monitoring kit 1 each by Does not apply route 4 (four) times daily - after meals and at bedtime. 1 month Diabetic Testing Supplies for QAC-QHS accuchecks. 1 each 1  . ibuprofen (ADVIL,MOTRIN) 200 MG tablet Take 800 mg by mouth every 8 (eight) hours as needed for moderate pain.    . naproxen (NAPROSYN) 500 MG tablet Take 1 tablet (500 mg total) by mouth 2 (two) times daily. 30 tablet 0  . naproxen sodium (ANAPROX) 220 MG tablet Take 440 mg by mouth 2 (two) times daily with a meal.    . glipiZIDE (GLUCOTROL) 5 MG tablet Take 1 tablet (5 mg total) by mouth 2 (two) times daily before a meal. 60 tablet 3  . metFORMIN (GLUCOPHAGE-XR) 500 MG 24 hr tablet TAKE 2 TABLETS WITH BREAKFAST 60 tablet 3  . traMADol (ULTRAM) 50 MG tablet Take 1 tablet (50 mg total) by mouth every 12 (twelve) hours as needed. 60 tablet 2  . brompheniramine-pseudoephedrine-DM 30-2-10 MG/5ML syrup Take 5 mLs by mouth 4 (four) times daily as needed. (Patient not taking: Reported on 02/09/2018) 120 mL 0  . Cetirizine HCl 10 MG CAPS Take 1 capsule (10 mg total) by mouth daily for 15 days. (Patient not taking: Reported on 02/09/2018) 15 capsule 0  . lurasidone (LATUDA) 40 MG TABS tablet Take 1 tablet (40 mg total) by mouth daily with breakfast. (Patient not taking: Reported on  02/09/2018) 30 tablet 3  . Menthol (ICY HOT ADVANCED RELIEF) 7.5 % PTCH Apply 1 patch topically as needed (pain).    Marland Kitchen azithromycin (ZITHROMAX) 250 MG tablet Take 1 tablet (250 mg total) by mouth daily. Take first 2 tablets together, then 1 every day until finished. (Patient not taking: Reported on 02/09/2018) 6 tablet 0  . cyclobenzaprine (FLEXERIL) 10 MG tablet Take 1 tablet (10 mg total) by mouth 2 (two) times daily as needed for muscle spasms. (Patient not taking: Reported on 02/09/2018) 20 tablet 0   No facility-administered medications prior to visit.     ROS Review of Systems  Constitutional: Negative for activity change and appetite change.  HENT:  Positive for congestion, postnasal drip and sinus pressure. Negative for sore throat.   Eyes: Negative for visual disturbance.  Respiratory: Negative for cough, chest tightness and shortness of breath.   Cardiovascular: Negative for chest pain and leg swelling.  Gastrointestinal: Negative for abdominal distention, abdominal pain, constipation and diarrhea.  Endocrine: Negative.   Genitourinary: Negative for dysuria.  Musculoskeletal: Negative for joint swelling and myalgias.  Skin: Negative for rash.  Allergic/Immunologic: Negative.   Neurological: Negative for weakness, light-headedness and numbness.  Psychiatric/Behavioral: Negative for dysphoric mood and suicidal ideas.    Objective:  BP 137/88   Pulse 78   Temp (!) 97.3 F (36.3 C) (Oral)   Ht 6' (1.829 m)   Wt (!) 366 lb 6.4 oz (166.2 kg)   SpO2 97%   BMI 49.69 kg/m   BP/Weight 08/12/2018 05/12/2018 2/80/0349  Systolic BP 179 150 569  Diastolic BP 88 71 84  Wt. (Lbs) 366.4 352.6 331.8  BMI 49.69 47.82 45  Some encounter information is confidential and restricted. Go to Review Flowsheets activity to see all data.      Physical Exam  Constitutional: He is oriented to person, place, and time. He appears well-developed and well-nourished.  Morbidly obese  Cardiovascular: Normal rate, normal heart sounds and intact distal pulses.  No murmur heard. Varicose veins in bilateral lower extremities  Pulmonary/Chest: Effort normal and breath sounds normal. He has no wheezes. He has no rales. He exhibits no tenderness.  Abdominal: Soft. Bowel sounds are normal. He exhibits no distension and no mass. There is no tenderness.  Musculoskeletal: Normal range of motion.  Neurological: He is alert and oriented to person, place, and time.  Psychiatric: He has a normal mood and affect.    CMP Latest Ref Rng & Units 05/17/2018 10/13/2017 06/14/2017  Glucose 65 - 99 mg/dL 131(H) 115(H) 93  BUN 6 - 20 mg/dL '9 10 17  '$ Creatinine 0.76 - 1.27  mg/dL 0.78 0.87 0.66  Sodium 134 - 144 mmol/L 141 139 138  Potassium 3.5 - 5.2 mmol/L 4.1 4.8 3.9  Chloride 96 - 106 mmol/L 101 102 105  CO2 20 - 29 mmol/L '21 26 25  '$ Calcium 8.7 - 10.2 mg/dL 9.2 9.3 8.8(L)  Total Protein 6.0 - 8.5 g/dL 6.6 7.1 6.5  Total Bilirubin 0.0 - 1.2 mg/dL 0.6 0.9 0.7  Alkaline Phos 39 - 117 IU/L 64 55 44  AST 0 - 40 IU/L '20 19 21  '$ ALT 0 - 44 IU/L '22 22 21    '$ Lipid Panel     Component Value Date/Time   CHOL 144 05/17/2018 1038   TRIG 135 05/17/2018 1038   HDL 38 (L) 05/17/2018 1038   CHOLHDL 3.8 05/17/2018 1038   CHOLHDL 4.0 11/12/2016 1613   VLDL 28 11/12/2016 1613  [place] 79 05/17/2018 1038     Lab Results  Component Value Date   HGBA1C 8.1 (A) 08/12/2018     Assessment & Plan:   1. Type 2 diabetes mellitus with other specified complication, without long-term current use of insulin (HCC) Uncontrolled with A1c of 8.1 Initiate Victoza due to cardiovascular benefit and weight loss advantage Discontinue glipizide Clinical pharmacist called in for education regarding administration and titration of Victoza Continue diabetic diet, lifestyle modifications Discussed community resources for ophthalmology as he is due for an annual eye exam and has no medical coverage for referral. - POCT glucose (manual entry) - POCT glycosylated hemoglobin (Hb A1C) - liraglutide (VICTOZA) 18 MG/3ML SOPN; Inject subcutaneously daily 0.6 mg for the first week then 1.2 mg for the next week and 1.8 mg thereafter  Dispense: 3 pen; Refill: 3 - metFORMIN (GLUCOPHAGE-XR) 500 MG 24 hr tablet; TAKE 2 TABLETS WITH BREAKFAST  Dispense: 60 tablet; Refill: 3  2. Other diabetic neurological complication associated with type 2 diabetes mellitus ([place]) Unable to tolerate gabapentin and Lyrica He is under a pain contract Symptoms are uncontrolled on tramadol and he is wanting to take this 3 times a day; I have advised him to use ibuprofen or Aleve in between doses of tramadol. -  Drug Screen 12+Alcohol+CRT, Ur - traMADol (ULTRAM) 50 MG tablet; Take 1 tablet (50 mg total) by mouth every 12 (twelve) hours as needed.  Dispense: 60 tablet; Refill: 2  3. Morbid obesity (HCC) Reduce portion sizes, increase physical activity  4. Acute non-recurrent frontal sinusitis Conservative treatment-rest, fluids, antihistamines He declines nasal spray No antibiotic indicated at this time   Meds ordered this encounter  Medications  . liraglutide (VICTOZA) 18 MG/3ML SOPN    Sig: Inject subcutaneously daily 0.6 mg for the first week then 1.2 mg for the next week and 1.8 mg thereafter    Dispense:  3 pen    Refill:  3  . metFORMIN (GLUCOPHAGE-XR) 500 MG 24 hr tablet    Sig: TAKE 2 TABLETS WITH BREAKFAST    Dispense:  60 tablet    Refill:  3  . traMADol (ULTRAM) 50 MG tablet    Sig: Take 1 tablet (50 mg total) by mouth every 12 (twelve) hours as needed.    Dispense:  60 tablet    Refill:  2    Follow-up: Return in about 3 months (around 11/12/2018) for Follow-up of diabetes mellitus.   Charlott Rakes MD

## 2018-08-12 NOTE — Patient Instructions (Signed)
Liraglutide injection What is this medicine? LIRAGLUTIDE (LIR a GLOO tide) is used to improve blood sugar control in adults with type 2 diabetes. This medicine may be used with other diabetes medicines. This drug may also reduce the risk of heart attack or stroke if you have type 2 diabetes and risk factors for heart disease. This medicine may be used for other purposes; ask your health care provider or pharmacist if you have questions. COMMON BRAND NAME(S): Victoza What should I tell my health care provider before I take this medicine? They need to know if you have any of these conditions: -endocrine tumors (MEN 2) or if someone in your family had these tumors -gallbladder disease -high cholesterol -history of alcohol abuse problem -history of pancreatitis -kidney disease or if you are on dialysis -liver disease -previous swelling of the tongue, face, or lips with difficulty breathing, difficulty swallowing, hoarseness, or tightening of the throat -stomach problems -thyroid cancer or if someone in your family had thyroid cancer -an unusual or allergic reaction to liraglutide, other medicines, foods, dyes, or preservatives -pregnant or trying to get pregnant -breast-feeding How should I use this medicine? This medicine is for injection under the skin of your upper leg, stomach area, or upper arm. You will be taught how to prepare and give this medicine. Use exactly as directed. Take your medicine at regular intervals. Do not take it more often than directed. It is important that you put your used needles and syringes in a special sharps container. Do not put them in a trash can. If you do not have a sharps container, call your pharmacist or healthcare provider to get one. A special MedGuide will be given to you by the pharmacist with each prescription and refill. Be sure to read this information carefully each time. Talk to your pediatrician regarding the use of this medicine in children.  Special care may be needed. Overdosage: If you think you have taken too much of this medicine contact a poison control center or emergency room at once. NOTE: This medicine is only for you. Do not share this medicine with others. What if I miss a dose? If you miss a dose, take it as soon as you can. If it is almost time for your next dose, take only that dose. Do not take double or extra doses. What may interact with this medicine? -other medicines for diabetes Many medications may cause changes in blood sugar, these include: -alcohol containing beverages -antiviral medicines for HIV or AIDS -aspirin and aspirin-like drugs -certain medicines for blood pressure, heart disease, irregular heart beat -chromium -diuretics -male hormones, such as estrogens or progestins, birth control pills -fenofibrate -gemfibrozil -isoniazid -lanreotide -male hormones or anabolic steroids -MAOIs like Carbex, Eldepryl, Marplan, Nardil, and Parnate -medicines for weight loss -medicines for allergies, asthma, cold, or cough -medicines for depression, anxiety, or psychotic disturbances -niacin -nicotine -NSAIDs, medicines for pain and inflammation, like ibuprofen or naproxen -octreotide -pasireotide -pentamidine -phenytoin -probenecid -quinolone antibiotics such as ciprofloxacin, levofloxacin, ofloxacin -some herbal dietary supplements -steroid medicines such as prednisone or cortisone -sulfamethoxazole; trimethoprim -thyroid hormones Some medications can hide the warning symptoms of low blood sugar (hypoglycemia). You may need to monitor your blood sugar more closely if you are taking one of these medications. These include: -beta-blockers, often used for high blood pressure or heart problems (examples include atenolol, metoprolol, propranolol) -clonidine -guanethidine -reserpine This list may not describe all possible interactions. Give your health care provider a list of all the medicines,    herbs, non-prescription drugs, or dietary supplements you use. Also tell them if you smoke, drink alcohol, or use illegal drugs. Some items may interact with your medicine. What should I watch for while using this medicine? Visit your doctor or health care professional for regular checks on your progress. Drink plenty of fluids while taking this medicine. Check with your doctor or health care professional if you get an attack of severe diarrhea, nausea, and vomiting. The loss of too much body fluid can make it dangerous for you to take this medicine. A test called the HbA1C (A1C) will be monitored. This is a simple blood test. It measures your blood sugar control over the last 2 to 3 months. You will receive this test every 3 to 6 months. Learn how to check your blood sugar. Learn the symptoms of low and high blood sugar and how to manage them. Always carry a quick-source of sugar with you in case you have symptoms of low blood sugar. Examples include hard sugar candy or glucose tablets. Make sure others know that you can choke if you eat or drink when you develop serious symptoms of low blood sugar, such as seizures or unconsciousness. They must get medical help at once. Tell your doctor or health care professional if you have high blood sugar. You might need to change the dose of your medicine. If you are sick or exercising more than usual, you might need to change the dose of your medicine. Do not skip meals. Ask your doctor or health care professional if you should avoid alcohol. Many nonprescription cough and cold products contain sugar or alcohol. These can affect blood sugar. Pens should never be shared. Even if the needle is changed, sharing may result in passing of viruses like hepatitis or HIV. Wear a medical ID bracelet or chain, and carry a card that describes your disease and details of your medicine and dosage times. What side effects may I notice from receiving this medicine? Side effects  that you should report to your doctor or health care professional as soon as possible: -allergic reactions like skin rash, itching or hives, swelling of the face, lips, or tongue -breathing problems -diarrhea that continues or is severe -lump or swelling on the neck -severe nausea -signs and symptoms of infection like fever or chills; cough; sore throat; pain or trouble passing urine -signs and symptoms of low blood sugar such as feeling anxious, confusion, dizziness, increased hunger, unusually weak or tired, sweating, shakiness, cold, irritable, headache, blurred vision, fast heartbeat, loss of consciousness -signs and symptoms of kidney injury like trouble passing urine or change in the amount of urine -trouble swallowing -unusual stomach upset or pain -vomiting Side effects that usually do not require medical attention (report to your doctor or health care professional if they continue or are bothersome): -constipation -decreased appetite -diarrhea -fatigue -headache -nausea -pain, redness, or irritation at site where injected -stomach upset -stuffy or runny nose This list may not describe all possible side effects. Call your doctor for medical advice about side effects. You may report side effects to FDA at 1-800-FDA-1088. Where should I keep my medicine? Keep out of the reach of children. Store unopened pen in a refrigerator between 2 and 8 degrees C (36 and 46 degrees F). Do not freeze or use if the medicine has been frozen. Protect from light and excessive heat. After you first use the pen, it can be stored at room temperature between 15 and 30 degrees C (59 and   86 degrees F) or in a refrigerator. Throw away your used pen after 30 days or after the expiration date, whichever comes first. Do not store your pen with the needle attached. If the needle is left on, medicine may leak from the pen. NOTE: This sheet is a summary. It may not cover all possible information. If you have  questions about this medicine, talk to your doctor, pharmacist, or health care provider.  2018 Elsevier/Gold Standard (2016-10-02 14:39:40)  

## 2018-08-13 LAB — DRUG SCREEN 12+ALCOHOL+CRT, UR
Amphetamines, Urine: NEGATIVE ng/mL
BARBITURATE: NEGATIVE ng/mL
BENZODIAZ UR QL: NEGATIVE ng/mL
COCAINE (METABOLITE): NEGATIVE ng/mL
CREATININE, RANDOM U: 186.2 mg/dL (ref 20.0–300.0)
Cannabinoids: NEGATIVE ng/mL
Ethanol, Urine: NEGATIVE %
METHADONE: NEGATIVE ng/mL
Meperidine: NEGATIVE ng/mL
OPIATE SCREEN URINE: NEGATIVE ng/mL
Oxycodone/Oxymorphone, Urine: NEGATIVE ng/mL
PROPOXYPHENE: NEGATIVE ng/mL
Phencyclidine: NEGATIVE ng/mL
TRAMADOL: NEGATIVE ng/mL

## 2018-08-16 ENCOUNTER — Telehealth: Payer: Self-pay | Admitting: Family Medicine

## 2018-08-16 DIAGNOSIS — E1169 Type 2 diabetes mellitus with other specified complication: Secondary | ICD-10-CM

## 2018-08-16 MED ORDER — INSULIN PEN NEEDLE 32G X 4 MM MISC: 3 refills | Status: DC

## 2018-08-16 MED FILL — TRUEPLUS PEN NDL 32GX5/32: 32G X 4 MM | 30 days supply | Qty: 100 | Fill #0

## 2018-08-16 MED FILL — TRUEPLUS PEN NDL 32GX5/32": 32G X 4 MM | 30 days supply | Qty: 100 | Fill #0

## 2018-08-16 NOTE — Telephone Encounter (Signed)
1) Medication(s) Requested (by name): needles for victoza  2) Pharmacy of Choice: [facility]   3) Special Requests:   Approved medications will be sent to the pharmacy, we will reach out if there is an issue.  Requests made after 3pm may not be addressed until the following business day!  If a patient is unsure of the name of the medication(s) please note and ask patient to call back when they are able to provide all info, do not send to responsible party until all information is available!

## 2018-08-17 ENCOUNTER — Telehealth: Payer: Self-pay | Admitting: Family Medicine

## 2018-08-17 NOTE — Telephone Encounter (Signed)
They were sent yesterday, the prescription is filled and ready for pickup!

## 2018-08-17 NOTE — Telephone Encounter (Signed)
Note    1) Medication(s) Requested (by name): needles for victoza  2) Pharmacy of Choice: [facility]

## 2018-08-23 ENCOUNTER — Telehealth: Payer: Self-pay | Admitting: Family Medicine

## 2018-08-23 ENCOUNTER — Telehealth: Payer: Self-pay

## 2018-08-23 NOTE — Telephone Encounter (Signed)
Patient was called and informed of lab results. 

## 2018-08-23 NOTE — Telephone Encounter (Signed)
Patient says he is having bad side effects from the victoza. Patient says he is experiencing severe nausea, muscle weakness, vomiting, cannot eat and hold it down. Please follow up with patient.Patient says he is having pain in his mid-section.  Advised patient to go to the ED or Urgent Care if symptoms continue or worsen.

## 2018-08-23 NOTE — Telephone Encounter (Signed)
-----   Message from Hoy RegisterEnobong Newlin, MD sent at 08/18/2018  1:30 PM EST ----- There was no evidence of tramadol in his urine drug screen which is not expected given he is currently on tramadol.  If we have a repeat negative screen tramadol will have to be discontinued.

## 2018-08-24 MED ORDER — GLIPIZIDE 10 MG PO TABS: 10.0000 mg | ORAL_TABLET | Freq: Two times a day (BID) | ORAL | 3 refills | Status: DC

## 2018-08-24 NOTE — Telephone Encounter (Signed)
Left message on voicemail to return call.

## 2018-08-24 NOTE — Telephone Encounter (Signed)
Moderate pain around mid section after Victoza injection Multiple emesis episodes, unable to keep anything down since Thursday night and has since d/c the medication.   Blood sugars are between 180-190.   Metformin and glipizide from previous rx.  Would like to go back on glipizide. He is able to tolerate food now that has stopped victoza.

## 2018-08-24 NOTE — Telephone Encounter (Signed)
I have discontinued Victoza and placed him on an increased dose of glipizide 10 mg twice daily.  Prescription has been sent to his pharmacy.

## 2018-08-25 MED FILL — glipiZIDE 10 MG TABS: 10 | 30 days supply | Qty: 60 | Fill #0

## 2018-08-25 NOTE — Telephone Encounter (Signed)
Pt aware that medication at the pharmacy:[facility] D/c victoza and take glipizide 10 mg as directed.  Advised to call office back if he has any questions.

## 2018-09-07 MED FILL — METFORMIN HCL ER 500 MG TAB: 500 | 30 days supply | Qty: 60 | Fill #0

## 2018-09-09 MED FILL — traMADol HCL 50 MG TABS: 50 | 30 days supply | Qty: 60 | Fill #1

## 2018-10-05 MED FILL — METFORMIN HCL ER 500 MG TAB: 500 | 30 days supply | Qty: 60 | Fill #1

## 2018-10-07 ENCOUNTER — Ambulatory Visit (HOSPITAL_COMMUNITY)
Admission: EM | Admit: 2018-10-07 | Discharge: 2018-10-07 | Disposition: A | Discharge status: Home / Self Care | Payer: Medicare Other | Attending: Family Medicine | Admitting: Family Medicine

## 2018-10-07 ENCOUNTER — Other Ambulatory Visit: Payer: Self-pay

## 2018-10-07 ENCOUNTER — Encounter (HOSPITAL_COMMUNITY): Payer: Self-pay

## 2018-10-07 DIAGNOSIS — R062 Wheezing: Secondary | ICD-10-CM | POA: Insufficient documentation

## 2018-10-07 DIAGNOSIS — J0100 Acute maxillary sinusitis, unspecified: Secondary | ICD-10-CM

## 2018-10-07 DIAGNOSIS — R05 Cough: Secondary | ICD-10-CM | POA: Diagnosis not present

## 2018-10-07 DIAGNOSIS — R059 Cough, unspecified: Secondary | ICD-10-CM

## 2018-10-07 MED ORDER — PREDNISONE 10 MG (21) PO TBPK: ORAL_TABLET | Freq: Every day | ORAL | 0 refills | Status: DC

## 2018-10-07 MED ORDER — ALBUTEROL SULFATE HFA 108 (90 BASE) MCG/ACT IN AERS: 1.0000 | INHALATION_SPRAY | Freq: Four times a day (QID) | RESPIRATORY_TRACT | 1 refills | Status: DC | PRN

## 2018-10-07 MED ORDER — AZITHROMYCIN 250 MG PO TABS: 250.0000 mg | ORAL_TABLET | Freq: Every day | ORAL | 0 refills | Status: DC

## 2018-10-07 NOTE — ED Provider Notes (Signed)
[facility]   [id number] 10/07/18 Arrival Time: 1330  ASSESSMENT & PLAN:  1. Cough   2. Wheezing   3. Acute non-recurrent maxillary sinusitis    No suspicion for pneumonia. Normal O2 sats. Will defer chest imaging at this time.  Meds ordered this encounter  Medications  . albuterol (PROVENTIL HFA;VENTOLIN HFA) 108 (90 Base) MCG/ACT inhaler    Sig: Inhale 1-2 puffs into the lungs every 6 (six) hours as needed for wheezing or shortness of breath.    Dispense:  1 Inhaler    Refill:  1  . azithromycin (ZITHROMAX) 250 MG tablet    Sig: Take 1 tablet (250 mg total) by mouth daily. Take first 2 tablets together, then 1 every day until finished.    Dispense:  6 tablet    Refill:  0  . predniSONE (STERAPRED UNI-PAK 21 TAB) 10 MG (21) TBPK tablet    Sig: Take by mouth daily. Take as directed.    Dispense:  21 tablet    Refill:  0   Discussed typical duration of symptoms. OTC symptom care as needed. Ensure adequate fluid intake and rest. May f/u with PCP or here as needed.  Reviewed expectations re: course of current medical issues. Questions answered. Outlined signs and symptoms indicating need for more acute intervention. Patient verbalized understanding. After Visit Summary given.   SUBJECTIVE: History from: patient.  Bradley [name] is a 38 y.o. male who presents with complaint of nasal congestion, post-nasal drainage, and a persistent dry cough. Coughing worse at night. Onset abrupt, 1.5 weeks ago. Overall without fatigue and without body aches. SOB: none. Wheezing: moderate and becoming more persistent. Fever: no. Overall normal PO intake without n/v. Known sick contacts: no. No specific or significant aggravating or alleviating factors reported. OTC treatment: cold medications without relief.  Social History   Tobacco Use  Smoking Status Current Every Day Smoker  . Packs/day: 1.00  . Types: Cigarettes  Smokeless Tobacco Never Used   ROS: As per HPI. All  other systems negative.   OBJECTIVE:  Vitals:   10/07/18 1354  BP: 129/85  Pulse: 88  Resp: 18  Temp: 97.8 F (36.6 C)  TempSrc: Oral  SpO2: 100%  Weight: (!) 158.8 kg    General appearance: alert; appears fatigued HEENT: nasal congestion; clear runny nose; throat irritation secondary to post-nasal drainage; reports significant maxillary sinus tenderness to palpation Neck: supple without LAD CV: RRR Lungs: unlabored respirations, symmetrical air entry with wheezing; cough: moderate; able to speak in full sentences without difficulty Abd: soft Ext: no LE edema Skin: warm and dry Psychological: alert and cooperative; normal mood and affect  Allergies  Allergen Reactions  . Gabapentin     Crying spells  . Lyrica [Pregabalin]     somnolence    Past Medical History:  Diagnosis Date  . ADHD   . ADHD (attention deficit hyperactivity disorder)   . Bipolar 1 disorder (HCC)   . Bipolar disorder (HCC)   . Diabetes mellitus without complication (HCC)   . Morbid obesity (HCC)   . Obesity   . Panic attack   . Varicose veins of both lower extremities with pain    Family History  Problem Relation Age of Onset  . Rheum arthritis Mother   . Diabetes Mother   . Heart failure Mother   . Stroke Mother    Social History   Socioeconomic History  . Marital status: Single    Spouse name: Not on file  .  Number of children: Not on file  . Years of education: Not on file  . Highest education level: Not on file  Occupational History  . Not on file  Social Needs  . Financial resource strain: Not on file  . Food insecurity:    Worry: Not on file    Inability: Not on file  . Transportation needs:    Medical: Not on file    Non-medical: Not on file  Tobacco Use  . Smoking status: Current Every Day Smoker    Packs/day: 1.00    Types: Cigarettes  . Smokeless tobacco: Never Used  Substance and Sexual Activity  . Alcohol use: Yes    Comment: Rarely  . Drug use: No  . Sexual  activity: Not on file  Lifestyle  . Physical activity:    Days per week: Not on file    Minutes per session: Not on file  . Stress: Not on file  Relationships  . Social connections:    Talks on phone: Not on file    Gets together: Not on file    Attends religious service: Not on file    Active member of club or organization: Not on file    Attends meetings of clubs or organizations: Not on file    Relationship status: Not on file  . Intimate partner violence:    Fear of current or ex partner: Not on file    Emotionally abused: Not on file    Physically abused: Not on file    Forced sexual activity: Not on file  Other Topics Concern  . Not on file  Social History Narrative   ** Merged History Encounter Mardella Layman**               Brennyn Ortlieb, MD 10/07/18 [phone]

## 2018-10-07 NOTE — ED Triage Notes (Signed)
Pt cc sinus pressure and cough x 1 week.

## 2018-10-08 MED FILL — traMADol HCL 50 MG TABS: 50 | 30 days supply | Qty: 60 | Fill #2

## 2018-10-28 MED FILL — glipiZIDE 10 MG TABS: 10 | 30 days supply | Qty: 60 | Fill #1

## 2018-11-06 ENCOUNTER — Ambulatory Visit (HOSPITAL_COMMUNITY)
Admission: EM | Admit: 2018-11-06 | Discharge: 2018-11-06 | Disposition: A | Discharge status: Home / Self Care | Payer: Medicare Other | Attending: Family Medicine | Admitting: Family Medicine

## 2018-11-06 ENCOUNTER — Other Ambulatory Visit: Payer: Self-pay

## 2018-11-06 ENCOUNTER — Encounter (HOSPITAL_COMMUNITY): Payer: Self-pay | Admitting: Emergency Medicine

## 2018-11-06 ENCOUNTER — Ambulatory Visit (INDEPENDENT_AMBULATORY_CARE_PROVIDER_SITE_OTHER): Payer: Medicare Other

## 2018-11-06 DIAGNOSIS — R0689 Other abnormalities of breathing: Secondary | ICD-10-CM

## 2018-11-06 DIAGNOSIS — R05 Cough: Secondary | ICD-10-CM | POA: Diagnosis not present

## 2018-11-06 DIAGNOSIS — J209 Acute bronchitis, unspecified: Secondary | ICD-10-CM | POA: Diagnosis not present

## 2018-11-06 MED ORDER — PREDNISONE 50 MG PO TABS: 50.0000 mg | ORAL_TABLET | Freq: Every day | ORAL | 0 refills | Status: AC

## 2018-11-06 MED ORDER — IPRATROPIUM-ALBUTEROL 0.5-2.5 (3) MG/3ML IN SOLN: 3.0000 mL | RESPIRATORY_TRACT | 0 refills | Status: DC | PRN

## 2018-11-06 MED ORDER — DOXYCYCLINE HYCLATE 100 MG PO CAPS: 100.0000 mg | ORAL_CAPSULE | Freq: Two times a day (BID) | ORAL | 0 refills | Status: AC

## 2018-11-06 MED ORDER — PSEUDOEPH-BROMPHEN-DM 30-2-10 MG/5ML PO SYRP: 5.0000 mL | ORAL_SOLUTION | Freq: Four times a day (QID) | ORAL | 0 refills | Status: DC | PRN

## 2018-11-06 MED ORDER — IPRATROPIUM-ALBUTEROL 0.5-2.5 (3) MG/3ML IN SOLN
RESPIRATORY_TRACT | Status: AC
  Filled 2018-11-06: qty 3

## 2018-11-06 MED ORDER — ALBUTEROL SULFATE HFA 108 (90 BASE) MCG/ACT IN AERS: 1.0000 | INHALATION_SPRAY | Freq: Four times a day (QID) | RESPIRATORY_TRACT | 0 refills | Status: DC | PRN

## 2018-11-06 MED ORDER — IPRATROPIUM-ALBUTEROL 0.5-2.5 (3) MG/3ML IN SOLN
3.0000 mL | Freq: Once | RESPIRATORY_TRACT | Status: AC
  Administered 2018-11-06: 3 mL via RESPIRATORY_TRACT

## 2018-11-06 NOTE — Discharge Instructions (Signed)
Begin doxycycline twice daily for next 10 days Prednisone daily for 5 days, take with food- monitor blood sugars Albuterol inhaler as needed Nebulizers as needed every 4-6 hours Cough syrup as needed  Continue to monitor breathing, symptoms, chest discomfort, follow up if worsening or not improving

## 2018-11-06 NOTE — ED Triage Notes (Signed)
The patient presented to the [facility] with a complaint of a cough x 3 weeks. 

## 2018-11-07 NOTE — ED Provider Notes (Signed)
[facility]    CSN: [id number] Arrival date & time: 11/06/18  1104     History   Chief Complaint Chief Complaint  Patient presents with  . Cough    HPI Bradley [name] is a 38 y.o. male history of DM type II, hypertension, tobacco abuse, presenting today for evaluation of cough.  Patient states that he has had a cough for approximately 3 weeks.  He noticed that of recently his symptoms have worsened and feels he has developed bronchitis.  States that he gets this a few times each year.  He previously had rhinorrhea and sore throat and was previously treated for sinusitis with azithromycin and a prednisone taper, this was over 1 week ago.  He states that he is lost previous inhalers prescribed to him.  HPI  Past Medical History:  Diagnosis Date  . ADHD   . ADHD (attention deficit hyperactivity disorder)   . Bipolar 1 disorder ([place])   . Bipolar disorder ([place])   . Diabetes mellitus without complication ([place])   . Morbid obesity ([place])   . Obesity   . Panic attack   . Varicose veins of both lower extremities with pain     Patient Active Problem List   Diagnosis Date Noted  . Diabetic neuropathy ([place]) 05/12/2018  . Chronic pain syndrome 05/20/2017  . Bipolar disorder, current episode mixed, moderate ([place]) 03/04/2017  . Varicose veins of both lower extremities with pain 02/18/2017  . Chronic deep vein thrombosis (DVT) of proximal vein of left lower extremity ([place]) 11/12/2016  . Smoking addiction 06/19/2016  . HTN (hypertension) 07/27/2013  . Morbid obesity ([place]) 07/27/2013  . Bipolar 1 disorder, mixed, moderate ([place]) 07/27/2013  . DM2 (diabetes mellitus, type 2) ([place]) 04/26/2013    Past Surgical History:  Procedure Laterality Date  . surgery on meatus as a child         Home Medications    Prior to Admission medications   Medication Sig Start Date End Date Taking? Authorizing Provider  albuterol (PROVENTIL HFA;VENTOLIN HFA) 108 (90 Base) MCG/ACT inhaler  Inhale 1-2 puffs into the lungs every 6 (six) hours as needed for wheezing or shortness of breath. 11/06/18   Zaara Sprowl C, PA-C  brompheniramine-pseudoephedrine-DM 30-2-10 MG/5ML syrup Take 5 mLs by mouth 4 (four) times daily as needed. 11/06/18   Hanako Tipping C, PA-C  doxycycline (VIBRAMYCIN) 100 MG capsule Take 1 capsule (100 mg total) by mouth 2 (two) times daily for 10 days. 11/06/18 11/16/18  Waunetta Riggle C, PA-C  glipiZIDE (GLUCOTROL) 10 MG tablet Take 1 tablet (10 mg total) by mouth 2 (two) times daily before a meal. 08/24/18   Charlott Rakes, MD  glucose monitoring kit (FREESTYLE) monitoring kit 1 each by Does not apply route 4 (four) times daily - after meals and at bedtime. 1 month Diabetic Testing Supplies for QAC-QHS accuchecks. 07/27/13   Thurnell Lose, MD  ibuprofen (ADVIL,MOTRIN) 200 MG tablet Take 800 mg by mouth every 8 (eight) hours as needed for moderate pain.    [provider]  Insulin Pen Needle (TRUEPLUS PEN NEEDLES) 32G X 4 MM MISC Use as directed to administer victoza 08/16/18   Newlin, Enobong, MD  ipratropium-albuterol (DUONEB) 0.5-2.5 (3) MG/3ML SOLN Take 3 mLs by nebulization every 4 (four) hours as needed. 11/06/18   Meng [place] C, PA-C  Menthol (ICY HOT ADVANCED RELIEF) 7.5 % PTCH Apply 1 patch topically as needed (pain).    [provider]  metFORMIN (GLUCOPHAGE-XR) 500  MG 24 hr tablet TAKE 2 TABLETS WITH BREAKFAST 08/12/18   Charlott Rakes, MD  naproxen (NAPROSYN) 500 MG tablet Take 1 tablet (500 mg total) by mouth 2 (two) times daily. 10/13/17   Blanchie Dessert, MD  naproxen sodium (ANAPROX) 220 MG tablet Take 440 mg by mouth 2 (two) times daily with a meal.    [provider]  predniSONE (DELTASONE) 50 MG tablet Take 1 tablet (50 mg total) by mouth daily for 5 days. 11/06/18 11/11/18  Nyair Depaulo C, PA-C  traMADol (ULTRAM) 50 MG tablet Take 1 tablet (50 mg total) by mouth every 12 (twelve) hours as needed. 08/12/18   Charlott Rakes, MD    Family History Family History  Problem Relation Age of Onset  . Rheum arthritis Mother   . Diabetes Mother   . Heart failure Mother   . Stroke Mother     Social History Social History   Tobacco Use  . Smoking status: Current Every Day Smoker    Packs/day: 1.00    Types: Cigarettes  . Smokeless tobacco: Never Used  Substance Use Topics  . Alcohol use: Yes    Comment: Rarely  . Drug use: No     Allergies   Gabapentin and Lyrica [pregabalin]   Review of Systems Review of Systems   Physical Exam Triage Vital Signs ED Triage Vitals  Enc Vitals Group     BP 11/06/18 1228 126/75     Pulse Rate 11/06/18 1228 90     Resp 11/06/18 1228 20     Temp 11/06/18 1228 98.4 F (36.9 C)     Temp Source 11/06/18 1228 Temporal     SpO2 11/06/18 1228 99 %     Weight --      Height --      Head Circumference --      Peak Flow --      Pain Score 11/06/18 1226 6     Pain Loc --      Pain Edu? --      Excl. in [place]? --    No data found.  Updated Vital Signs BP 126/75 (BP Location: Right Arm)   Pulse 90   Temp 98.4 F (36.9 C) (Temporal)   Resp 20   SpO2 99%   Visual Acuity Right Eye Distance:   Left Eye Distance:   Bilateral Distance:    Right Eye Near:   Left Eye Near:    Bilateral Near:     Physical Exam Vitals signs and nursing note reviewed.  Constitutional:      Appearance: He is well-developed.  HENT:     Head: Normocephalic and atraumatic.     Ears:     Comments: Bilateral ears without tenderness to palpation of external auricle, tragus and mastoid, EAC's without erythema or swelling, TM's with good bony landmarks and cone of light. Non erythematous.    Mouth/Throat:     Comments: Oral mucosa pink and moist, no tonsillar enlargement or exudate. Posterior pharynx patent and nonerythematous, no uvula deviation or swelling.  Sounds slightly congested Eyes:     Conjunctiva/sclera: Conjunctivae normal.  Neck:     Musculoskeletal: Neck  supple.  Cardiovascular:     Rate and Rhythm: Normal rate and regular rhythm.     Heart sounds: No murmur.  Pulmonary:     Effort: Pulmonary effort is normal. No respiratory distress.     Breath sounds: Wheezing and rhonchi present.     Comments: Significant wheezing and rhonchi  throughout bilateral lung fields Abdominal:     Palpations: Abdomen is soft.     Tenderness: There is no abdominal tenderness.  Skin:    General: Skin is warm and dry.  Neurological:     Mental Status: He is alert.      UC Treatments / Results  Labs (all labs ordered are listed, but only abnormal results are displayed) Labs Reviewed - No data to display  EKG None  Radiology Dg Chest 2 View  Result Date: 11/06/2018 CLINICAL DATA:  Cough for 3 weeks EXAM: CHEST - 2 VIEW COMPARISON:  Chest radiograph 11/19/2016 FINDINGS: Normal cardiac and mediastinal contours. Minimal bibasilar opacities. No pleural effusion or pneumothorax. Thoracic spine degenerative changes. IMPRESSION: Minimal bibasilar opacities favored to represent atelectasis. Infection not excluded. Electronically Signed   By: Lovey Newcomer M.D.   On: 11/06/2018 13:14    Procedures Procedures (including critical care time)  Medications Ordered in UC Medications  ipratropium-albuterol (DUONEB) 0.5-2.5 (3) MG/3ML nebulizer solution 3 mL (3 mLs Nebulization Given 11/06/18 1259)    Initial Impression / Assessment and Plan / UC Course  I have reviewed the triage vital signs and the nursing notes.  Pertinent labs & imaging results that were available during my care of the patient were reviewed by me and considered in my medical decision making (see chart for details).     Chest x-ray obtained to check for pneumonia given length of symptoms, DuoNeb provided in clinic.  Wheezing and rhonchi modestly improved after breathing treatment.  No pneumonia on chest x-ray.  Will treat for bronchitis, doxycycline, prednisone, albuterol.  Provided patient with  nebulizer machine as he will likely benefit from this at home, duo nebs prescribed.  Continue to monitor breathing, temperature,Discussed strict return precautions. Patient verbalized understanding and is agreeable with plan.  Final Clinical Impressions(s) / UC Diagnoses   Final diagnoses:  Acute bronchitis, unspecified organism     Discharge Instructions     Begin doxycycline twice daily for next 10 days Prednisone daily for 5 days, take with food- monitor blood sugars Albuterol inhaler as needed Nebulizers as needed every 4-6 hours Cough syrup as needed  Continue to monitor breathing, symptoms, chest discomfort, follow up if worsening or not improving   ED Prescriptions    Medication Sig Dispense Auth. Provider   albuterol (PROVENTIL HFA;VENTOLIN HFA) 108 (90 Base) MCG/ACT inhaler Inhale 1-2 puffs into the lungs every 6 (six) hours as needed for wheezing or shortness of breath. 1 Inhaler Anitha Kreiser C, PA-C   predniSONE (DELTASONE) 50 MG tablet Take 1 tablet (50 mg total) by mouth daily for 5 days. 5 tablet Xin Klawitter C, PA-C   brompheniramine-pseudoephedrine-DM 30-2-10 MG/5ML syrup Take 5 mLs by mouth 4 (four) times daily as needed. 120 mL Hamza Empson C, PA-C   doxycycline (VIBRAMYCIN) 100 MG capsule Take 1 capsule (100 mg total) by mouth 2 (two) times daily for 10 days. 20 capsule Coner Gibbard C, PA-C   ipratropium-albuterol (DUONEB) 0.5-2.5 (3) MG/3ML SOLN Take 3 mLs by nebulization every 4 (four) hours as needed. 360 mL Stanislaus Kaltenbach C, PA-C     Controlled Substance Prescriptions [place] Controlled Substance Registry consulted? Not Applicable   Janith Lima, [place] 11/07/18 [phone]

## 2018-11-10 MED FILL — METFORMIN HCL ER 500 MG TAB: 500 | 30 days supply | Qty: 60 | Fill #2

## 2018-11-15 ENCOUNTER — Ambulatory Visit: Payer: Medicare Other | Attending: Family Medicine | Admitting: Family Medicine

## 2018-11-15 ENCOUNTER — Ambulatory Visit (HOSPITAL_BASED_OUTPATIENT_CLINIC_OR_DEPARTMENT_OTHER): Payer: Medicare Other | Admitting: Pharmacist

## 2018-11-15 ENCOUNTER — Encounter: Payer: Self-pay | Admitting: Family Medicine

## 2018-11-15 VITALS — BP 135/89 | HR 95 | Temp 98.1°F | Ht 72.0 in | Wt 360.8 lb

## 2018-11-15 DIAGNOSIS — Z794 Long term (current) use of insulin: Secondary | ICD-10-CM | POA: Diagnosis not present

## 2018-11-15 DIAGNOSIS — Z833 Family history of diabetes mellitus: Secondary | ICD-10-CM | POA: Insufficient documentation

## 2018-11-15 DIAGNOSIS — F319 Bipolar disorder, unspecified: Secondary | ICD-10-CM | POA: Insufficient documentation

## 2018-11-15 DIAGNOSIS — E1169 Type 2 diabetes mellitus with other specified complication: Secondary | ICD-10-CM

## 2018-11-15 DIAGNOSIS — E1165 Type 2 diabetes mellitus with hyperglycemia: Secondary | ICD-10-CM

## 2018-11-15 DIAGNOSIS — R197 Diarrhea, unspecified: Secondary | ICD-10-CM | POA: Diagnosis not present

## 2018-11-15 DIAGNOSIS — Z791 Long term (current) use of non-steroidal anti-inflammatories (NSAID): Secondary | ICD-10-CM | POA: Diagnosis not present

## 2018-11-15 DIAGNOSIS — F909 Attention-deficit hyperactivity disorder, unspecified type: Secondary | ICD-10-CM | POA: Diagnosis not present

## 2018-11-15 DIAGNOSIS — Z6841 Body Mass Index (BMI) 40.0 and over, adult: Secondary | ICD-10-CM | POA: Diagnosis not present

## 2018-11-15 DIAGNOSIS — R140 Abdominal distension (gaseous): Secondary | ICD-10-CM | POA: Diagnosis not present

## 2018-11-15 DIAGNOSIS — K5900 Constipation, unspecified: Secondary | ICD-10-CM | POA: Diagnosis not present

## 2018-11-15 DIAGNOSIS — E669 Obesity, unspecified: Secondary | ICD-10-CM

## 2018-11-15 DIAGNOSIS — Z888 Allergy status to other drugs, medicaments and biological substances status: Secondary | ICD-10-CM | POA: Insufficient documentation

## 2018-11-15 DIAGNOSIS — Z79899 Other long term (current) drug therapy: Secondary | ICD-10-CM | POA: Insufficient documentation

## 2018-11-15 DIAGNOSIS — E1149 Type 2 diabetes mellitus with other diabetic neurological complication: Secondary | ICD-10-CM | POA: Insufficient documentation

## 2018-11-15 DIAGNOSIS — E1140 Type 2 diabetes mellitus with diabetic neuropathy, unspecified: Secondary | ICD-10-CM

## 2018-11-15 LAB — POCT GLYCOSYLATED HEMOGLOBIN (HGB A1C): HbA1c, POC (controlled diabetic range): 10.1 % — AB (ref 0.0–7.0)

## 2018-11-15 LAB — GLUCOSE, POCT (MANUAL RESULT ENTRY): POC Glucose: 261 mg/dl — AB (ref 70–99)

## 2018-11-15 MED ORDER — METFORMIN HCL ER 500 MG PO TB24: ORAL_TABLET | ORAL | 3 refills | Status: DC

## 2018-11-15 MED ORDER — TRAMADOL HCL 50 MG PO TABS: 50.0000 mg | ORAL_TABLET | Freq: Two times a day (BID) | ORAL | 2 refills | Status: DC | PRN

## 2018-11-15 MED ORDER — METOCLOPRAMIDE HCL 5 MG PO TABS: 5.0000 mg | ORAL_TABLET | Freq: Three times a day (TID) | ORAL | 0 refills | Status: DC

## 2018-11-15 MED ORDER — INSULIN GLARGINE 100 UNIT/ML SOLOSTAR PEN: 10.0000 [IU] | PEN_INJECTOR | Freq: Every day | SUBCUTANEOUS | 3 refills | Status: DC

## 2018-11-15 MED ORDER — GLIPIZIDE 10 MG PO TABS: 10.0000 mg | ORAL_TABLET | Freq: Two times a day (BID) | ORAL | 3 refills | Status: DC

## 2018-11-15 NOTE — Patient Instructions (Signed)
Diabetes Mellitus and Nutrition, Adult  When you have diabetes (diabetes mellitus), it is very important to have healthy eating habits because your blood sugar (glucose) levels are greatly affected by what you eat and drink. Eating healthy foods in the appropriate amounts, at about the same times every day, can help you:  · Control your blood glucose.  · Lower your risk of heart disease.  · Improve your blood pressure.  · Reach or maintain a healthy weight.  Every person with diabetes is different, and each person has different needs for a meal plan. Your health care provider may recommend that you work with a diet and nutrition specialist (dietitian) to make a meal plan that is best for you. Your meal plan may vary depending on factors such as:  · The calories you need.  · The medicines you take.  · Your weight.  · Your blood glucose, blood pressure, and cholesterol levels.  · Your activity level.  · Other health conditions you have, such as heart or kidney disease.  How do carbohydrates affect me?  Carbohydrates, also called carbs, affect your blood glucose level more than any other type of food. Eating carbs naturally raises the amount of glucose in your blood. Carb counting is a method for keeping track of how many carbs you eat. Counting carbs is important to keep your blood glucose at a healthy level, especially if you use insulin or take certain oral diabetes medicines.  It is important to know how many carbs you can safely have in each meal. This is different for every person. Your dietitian can help you calculate how many carbs you should have at each meal and for each snack.  Foods that contain carbs include:  · Bread, cereal, rice, pasta, and crackers.  · Potatoes and corn.  · Peas, beans, and lentils.  · Milk and yogurt.  · Fruit and juice.  · Desserts, such as cakes, cookies, ice cream, and candy.  How does alcohol affect me?  Alcohol can cause a sudden decrease in blood glucose (hypoglycemia),  especially if you use insulin or take certain oral diabetes medicines. Hypoglycemia can be a life-threatening condition. Symptoms of hypoglycemia (sleepiness, dizziness, and confusion) are similar to symptoms of having too much alcohol.  If your health care provider says that alcohol is safe for you, follow these guidelines:  · Limit alcohol intake to no more than 1 drink per day for nonpregnant women and 2 drinks per day for men. One drink equals 12 oz of beer, 5 oz of wine, or 1½ oz of hard liquor.  · Do not drink on an empty stomach.  · Keep yourself hydrated with water, diet soda, or unsweetened iced tea.  · Keep in mind that regular soda, juice, and other mixers may contain a lot of sugar and must be counted as carbs.  What are tips for following this plan?    Reading food labels  · Start by checking the serving size on the "Nutrition Facts" label of packaged foods and drinks. The amount of calories, carbs, fats, and other nutrients listed on the label is based on one serving of the item. Many items contain more than one serving per package.  · Check the total grams (g) of carbs in one serving. You can calculate the number of servings of carbs in one serving by dividing the total carbs by 15. For example, if a food has 30 g of total carbs, it would be equal to 2   servings of carbs.  · Check the number of grams (g) of saturated and trans fats in one serving. Choose foods that have low or no amount of these fats.  · Check the number of milligrams (mg) of salt (sodium) in one serving. Most people should limit total sodium intake to less than 2,300 mg per day.  · Always check the nutrition information of foods labeled as "low-fat" or "nonfat". These foods may be higher in added sugar or refined carbs and should be avoided.  · Talk to your dietitian to identify your daily goals for nutrients listed on the label.  Shopping  · Avoid buying canned, premade, or processed foods. These foods tend to be high in fat, sodium,  and added sugar.  · Shop around the outside edge of the grocery store. This includes fresh fruits and vegetables, bulk grains, fresh meats, and fresh dairy.  Cooking  · Use low-heat cooking methods, such as baking, instead of high-heat cooking methods like deep frying.  · Cook using healthy oils, such as olive, canola, or sunflower oil.  · Avoid cooking with butter, cream, or high-fat meats.  Meal planning  · Eat meals and snacks regularly, preferably at the same times every day. Avoid going long periods of time without eating.  · Eat foods high in fiber, such as fresh fruits, vegetables, beans, and whole grains. Talk to your dietitian about how many servings of carbs you can eat at each meal.  · Eat 4-6 ounces (oz) of lean protein each day, such as lean meat, chicken, fish, eggs, or tofu. One oz of lean protein is equal to:  ? 1 oz of meat, chicken, or fish.  ? 1 egg.  ? ¼ cup of tofu.  · Eat some foods each day that contain healthy fats, such as avocado, nuts, seeds, and fish.  Lifestyle  · Check your blood glucose regularly.  · Exercise regularly as told by your health care provider. This may include:  ? 150 minutes of moderate-intensity or vigorous-intensity exercise each week. This could be brisk walking, biking, or water aerobics.  ? Stretching and doing strength exercises, such as yoga or weightlifting, at least 2 times a week.  · Take medicines as told by your health care provider.  · Do not use any products that contain nicotine or tobacco, such as cigarettes and e-cigarettes. If you need help quitting, ask your health care provider.  · Work with a counselor or diabetes educator to identify strategies to manage stress and any emotional and social challenges.  Questions to ask a health care provider  · Do I need to meet with a diabetes educator?  · Do I need to meet with a dietitian?  · What number can I call if I have questions?  · When are the best times to check my blood glucose?  Where to find more  information:  · American Diabetes Association: diabetes.org  · Academy of Nutrition and Dietetics: www.eatright.org  · National Institute of Diabetes and Digestive and Kidney Diseases (NIH): www.niddk.nih.gov  Summary  · A healthy meal plan will help you control your blood glucose and maintain a healthy lifestyle.  · Working with a diet and nutrition specialist (dietitian) can help you make a meal plan that is best for you.  · Keep in mind that carbohydrates (carbs) and alcohol have immediate effects on your blood glucose levels. It is important to count carbs and to use alcohol carefully.  This information is not intended to   replace advice given to you by your health care provider. Make sure you discuss any questions you have with your health care provider.  Document Released: 06/12/2005 Document Revised: 04/15/2017 Document Reviewed: 10/20/2016  Elsevier Interactive Patient Education © 2019 [employer]

## 2018-11-15 NOTE — Progress Notes (Signed)
Patient was educated on the use of the Lantus pen. Reviewed necessary supplies and operation of the pen. Also reviewed goal blood glucose levels. Patient was able to demonstrate use. All questions and concerns were addressed.  

## 2018-11-15 NOTE — Progress Notes (Signed)
Subjective:  Patient ID: Bradley [name], male    DOB: August 28, 1981  Age: 38 y.o. MRN: [id number]  CC: Diabetes   HPI Bradley [name] is a 38 year old male with a history of morbid obesity, type 2 diabetes mellitus (A1c 10.1), diabetic neuropathy, Bipolar disorder who presents today for follow-up visit. His A1c is 10.1 today and he attributes this to drinking orange juice over the last few days when he did not feel well.  Previously was on Victoza which he could not tolerate due to GI side effects.  His neuropathy is stable on tramadol; previously unable to tolerate gabapentin and Lyrica. He had an ED visit last week where he was diagnosed with bronchitis and placed on doxycycline which he has almost completed along with an antitussive and prednisone with resolution of his symptoms. He complains of alternating diarrhea and constipation with predominant diarrhea but denies abdominal cramping.  Endorses abdominal bloating and early satiety. Not up-to-date on annual eye exam as he does not have the $75 co-pay required to see an ophthalmologist.  We have discussed community resources including [facility] for [facility] exams.  Past Medical History:  Diagnosis Date  . ADHD   . ADHD (attention deficit hyperactivity disorder)   . Bipolar 1 disorder ([place])   . Bipolar disorder ([place])   . Diabetes mellitus without complication ([place])   . Morbid obesity ([place])   . Obesity   . Panic attack   . Varicose veins of both lower extremities with pain     Past Surgical History:  Procedure Laterality Date  . surgery on meatus as a child      Family History  Problem Relation Age of Onset  . Rheum arthritis Mother   . Diabetes Mother   . Heart failure Mother   . Stroke Mother     Allergies  Allergen Reactions  . Gabapentin     Crying spells  . Lyrica [Pregabalin]     somnolence    Outpatient Medications Prior to Visit  Medication Sig Dispense Refill  . albuterol (PROVENTIL HFA;VENTOLIN HFA)  108 (90 Base) MCG/ACT inhaler Inhale 1-2 puffs into the lungs every 6 (six) hours as needed for wheezing or shortness of breath. 1 Inhaler 0  . brompheniramine-pseudoephedrine-DM 30-2-10 MG/5ML syrup Take 5 mLs by mouth 4 (four) times daily as needed. 120 mL 0  . glucose monitoring kit (FREESTYLE) monitoring kit 1 each by Does not apply route 4 (four) times daily - after meals and at bedtime. 1 month Diabetic Testing Supplies for QAC-QHS accuchecks. 1 each 1  . ibuprofen (ADVIL,MOTRIN) 200 MG tablet Take 800 mg by mouth every 8 (eight) hours as needed for moderate pain.    . naproxen (NAPROSYN) 500 MG tablet Take 1 tablet (500 mg total) by mouth 2 (two) times daily. 30 tablet 0  . glipiZIDE (GLUCOTROL) 10 MG tablet Take 1 tablet (10 mg total) by mouth 2 (two) times daily before a meal. 60 tablet 3  . metFORMIN (GLUCOPHAGE-XR) 500 MG 24 hr tablet TAKE 2 TABLETS WITH BREAKFAST 60 tablet 3  . traMADol (ULTRAM) 50 MG tablet Take 1 tablet (50 mg total) by mouth every 12 (twelve) hours as needed. 60 tablet 2  . doxycycline (VIBRAMYCIN) 100 MG capsule Take 1 capsule (100 mg total) by mouth 2 (two) times daily for 10 days. (Patient not taking: Reported on 11/15/2018) 20 capsule 0  . Insulin Pen Needle (TRUEPLUS PEN NEEDLES) 32G X 4 MM MISC Use as directed to administer victoza (  Patient not taking: Reported on 11/15/2018) 100 each 3  . ipratropium-albuterol (DUONEB) 0.5-2.5 (3) MG/3ML SOLN Take 3 mLs by nebulization every 4 (four) hours as needed. (Patient not taking: Reported on 11/15/2018) 360 mL 0  . Menthol (ICY HOT ADVANCED RELIEF) 7.5 % PTCH Apply 1 patch topically as needed (pain).    . naproxen sodium (ANAPROX) 220 MG tablet Take 440 mg by mouth 2 (two) times daily with a meal.     No facility-administered medications prior to visit.      ROS Review of Systems General: negative for fever, weight loss, appetite change Eyes: no visual symptoms. ENT: no ear symptoms, no sinus tenderness, no nasal  congestion or sore throat. Neck: no pain  Respiratory: no wheezing, shortness of breath, cough Cardiovascular: no chest pain, no dyspnea on exertion, no pedal edema, no orthopnea. Gastrointestinal: no abdominal pain, no diarrhea, no constipation Genito-Urinary: no urinary frequency, no dysuria, no polyuria. Hematologic: no bruising Endocrine: no cold or heat intolerance Neurological: no headaches, no seizures, no tremors, + paresthesia Musculoskeletal: no joint pains, no joint swelling Skin: no pruritus, no rash. Psychological: no depression, no anxiety,    Objective:  BP 135/89   Pulse 95   Temp 98.1 F (36.7 C) (Oral)   Ht 6' (1.829 m)   Wt (!) 360 lb 12.8 oz (163.7 kg)   SpO2 97%   BMI 48.93 kg/m   BP/Weight 11/15/2018 12/03/4825 0/03/8674  Systolic BP 449 201 007  Diastolic BP 89 75 85  Wt. (Lbs) 360.8 - 350  BMI 48.93 - 47.47  Some encounter information is confidential and restricted. Go to Review Flowsheets activity to see all data.      Physical Exam Constitutional: obese Eyes: PERRLA HEENT: Head is atraumatic, normal sinuses, normal oropharynx, normal appearing tonsils and palate, tympanic membrane is normal bilaterally. Neck: normal range of motion, no thyromegaly, no JVD Cardiovascular: normal rate and rhythm, normal heart sounds, no murmurs, rub or gallop, no pedal edema Respiratory: Normal breath sounds, clear to auscultation bilaterally, no wheezes, no rales, no rhonchi Abdomen: soft, not tender to palpation, normal bowel sounds, no enlarged organs Musculoskeletal: Full ROM, no tenderness in joints Skin: warm and dry, no lesions. Neurological: alert, oriented x3, cranial nerves I-XII grossly intact , normal motor strength, hyperesthesia in bilateral lower extremities Psychological: normal mood.   CMP Latest Ref Rng & Units 05/17/2018 10/13/2017 06/14/2017  Glucose 65 - 99 mg/dL 131(H) 115(H) 93  BUN 6 - 20 mg/dL '9 10 17  '$ Creatinine 0.76 - 1.27 mg/dL 0.78 0.87  0.66  Sodium 134 - 144 mmol/L 141 139 138  Potassium 3.5 - 5.2 mmol/L 4.1 4.8 3.9  Chloride 96 - 106 mmol/L 101 102 105  CO2 20 - 29 mmol/L '21 26 25  '$ Calcium 8.7 - 10.2 mg/dL 9.2 9.3 8.8(L)  Total Protein 6.0 - 8.5 g/dL 6.6 7.1 6.5  Total Bilirubin 0.0 - 1.2 mg/dL 0.6 0.9 0.7  Alkaline Phos 39 - 117 IU/L 64 55 44  AST 0 - 40 IU/L '20 19 21  '$ ALT 0 - 44 IU/L '22 22 21    '$ Lipid Panel     Component Value Date/Time   CHOL 144 05/17/2018 1038   TRIG 135 05/17/2018 1038   HDL 38 (L) 05/17/2018 1038   CHOLHDL 3.8 05/17/2018 1038   CHOLHDL 4.0 11/12/2016 1613   VLDL 28 11/12/2016 1613   LDLCALC 79 05/17/2018 1038    CBC    Component Value Date/Time   WBC  14.1 (H) 10/13/2017 0415   RBC 5.88 (H) 10/13/2017 0415   HGB 17.5 (H) 10/13/2017 0415   HGB 16.5 05/20/2017 1549   HCT 49.8 10/13/2017 0415   HCT 48.5 05/20/2017 1549   PLT 179 10/13/2017 0415   PLT 140 (L) 05/20/2017 1549   MCV 84.7 10/13/2017 0415   MCV 85 05/20/2017 1549   MCH 29.8 10/13/2017 0415   MCHC 35.1 10/13/2017 0415   RDW 13.1 10/13/2017 0415   RDW 14.6 05/20/2017 1549   LYMPHSABS 3.9 10/13/2017 0415   LYMPHSABS 2.5 05/20/2017 1549   MONOABS 0.7 10/13/2017 0415   EOSABS 0.3 10/13/2017 0415   EOSABS 0.2 05/20/2017 1549   BASOSABS 0.1 10/13/2017 0415   BASOSABS 0.0 05/20/2017 1549    Lab Results  Component Value Date   HGBA1C 10.1 (A) 11/15/2018    Assessment & Plan:   1. Type 2 diabetes mellitus with other specified complication, with long-term current use of insulin (HCC) Uncontrolled with A1c of 10.1 Unable to tolerate Victoza due to GI side effects Commenced on Lantus and clinical pharmacist called in for teaching Trial of Reglan for possible gastroparesis He will return for follow-up and assessment of his blood sugar log with the clinical pharmacist - POCT glucose (manual entry) - POCT glycosylated hemoglobin (Hb A1C) - metFORMIN (GLUCOPHAGE-XR) 500 MG 24 hr tablet; TAKE 2 TABLETS WITH BREAKFAST   Dispense: 60 tablet; Refill: 3 - Microalbumin / creatinine urine ratio; Future - CMP14+EGFR; Future - Lipid panel; Future  2. Other diabetic neurological complication associated with type 2 diabetes mellitus ([place]) Unable to tolerate Lyrica and gabapentin - traMADol (ULTRAM) 50 MG tablet; Take 1 tablet (50 mg total) by mouth every 12 (twelve) hours as needed.  Dispense: 60 tablet; Refill: 2    Meds ordered this encounter  Medications  . Insulin Glargine (LANTUS SOLOSTAR) 100 UNIT/ML Solostar Pen    Sig: Inject 10 Units into the skin daily.    Dispense:  3 pen    Refill:  3  . glipiZIDE (GLUCOTROL) 10 MG tablet    Sig: Take 1 tablet (10 mg total) by mouth 2 (two) times daily before a meal.    Dispense:  60 tablet    Refill:  3  . metFORMIN (GLUCOPHAGE-XR) 500 MG 24 hr tablet    Sig: TAKE 2 TABLETS WITH BREAKFAST    Dispense:  60 tablet    Refill:  3  . traMADol (ULTRAM) 50 MG tablet    Sig: Take 1 tablet (50 mg total) by mouth every 12 (twelve) hours as needed.    Dispense:  60 tablet    Refill:  2    Follow-up: Return for 1 month follow up with Lurena Joiner; 3 months with PCP.       Charlott Rakes, MD, FAAFP. [facility] and [place], [place]   11/15/2018, 4:57 PM

## 2018-11-16 ENCOUNTER — Other Ambulatory Visit: Payer: Self-pay | Admitting: Pharmacist

## 2018-11-16 DIAGNOSIS — Z794 Long term (current) use of insulin: Principal | ICD-10-CM

## 2018-11-16 DIAGNOSIS — E1169 Type 2 diabetes mellitus with other specified complication: Secondary | ICD-10-CM

## 2018-11-16 MED ORDER — BASAGLAR KWIKPEN 100 UNIT/ML ~~LOC~~ SOPN: 10.0000 [IU] | PEN_INJECTOR | Freq: Every day | SUBCUTANEOUS | 2 refills | Status: DC

## 2018-11-16 MED FILL — METOCLOPRAMIDE 5 MG TABLET: 5 | 30 days supply | Qty: 90 | Fill #0

## 2018-11-16 MED FILL — BASAGLAR 100 UNIT/ML KWIKPE: 100 | 30 days supply | Qty: 3 | Fill #0

## 2018-11-16 MED FILL — traMADol HCL 50 MG TABS: 50 | 30 days supply | Qty: 60 | Fill #0

## 2018-11-16 NOTE — Progress Notes (Signed)
Switch made per auto-sub policy. Pt's insurance prefers Hospital doctor.

## 2018-11-19 ENCOUNTER — Ambulatory Visit: Payer: Medicare Other | Attending: Family Medicine

## 2018-11-19 DIAGNOSIS — Z794 Long term (current) use of insulin: Principal | ICD-10-CM

## 2018-11-19 DIAGNOSIS — E1169 Type 2 diabetes mellitus with other specified complication: Secondary | ICD-10-CM | POA: Diagnosis not present

## 2018-11-20 LAB — CMP14+EGFR
ALT: 40 IU/L (ref 0–44)
AST: 23 IU/L (ref 0–40)
Albumin/Globulin Ratio: 1.3 (ref 1.2–2.2)
Albumin: 3.9 g/dL — ABNORMAL LOW (ref 4.0–5.0)
Alkaline Phosphatase: 74 IU/L (ref 39–117)
BUN/Creatinine Ratio: 11 (ref 9–20)
BUN: 8 mg/dL (ref 6–20)
Bilirubin Total: 1.1 mg/dL (ref 0.0–1.2)
CO2: 23 mmol/L (ref 20–29)
Calcium: 9 mg/dL (ref 8.7–10.2)
Chloride: 101 mmol/L (ref 96–106)
Creatinine, Ser: 0.7 mg/dL — ABNORMAL LOW (ref 0.76–1.27)
GFR calc Af Amer: 138 mL/min/{1.73_m2} (ref >59)
GFR calc non Af Amer: 120 mL/min/{1.73_m2} (ref >59)
Globulin, Total: 3 g/dL (ref 1.5–4.5)
Glucose: 249 mg/dL — ABNORMAL HIGH (ref 65–99)
Potassium: 4.8 mmol/L (ref 3.5–5.2)
Sodium: 139 mmol/L (ref 134–144)
Total Protein: 6.9 g/dL (ref 6.0–8.5)

## 2018-11-20 LAB — LIPID PANEL
Chol/HDL Ratio: 3.6 ratio (ref 0.0–5.0)
Cholesterol, Total: 132 mg/dL (ref 100–199)
HDL: 37 mg/dL — ABNORMAL LOW (ref >39)
LDL Calculated: 72 mg/dL (ref 0–99)
Triglycerides: 115 mg/dL (ref 0–149)
VLDL Cholesterol Cal: 23 mg/dL (ref 5–40)

## 2018-11-20 LAB — MICROALBUMIN / CREATININE URINE RATIO
Creatinine, Urine: 181.9 mg/dL
Microalb/Creat Ratio: 13 mg/g creat (ref 0–29)
Microalbumin, Urine: 23.6 ug/mL

## 2018-11-26 ENCOUNTER — Emergency Department (HOSPITAL_COMMUNITY): Payer: Medicare Other

## 2018-11-26 ENCOUNTER — Encounter (HOSPITAL_COMMUNITY): Payer: Self-pay | Admitting: Emergency Medicine

## 2018-11-26 ENCOUNTER — Observation Stay (HOSPITAL_COMMUNITY)
Admission: EM | Admit: 2018-11-26 | Discharge: 2018-11-27 | Disposition: A | Discharge status: Home / Self Care | Payer: Medicare Other | Attending: Internal Medicine | Admitting: Internal Medicine

## 2018-11-26 DIAGNOSIS — D696 Thrombocytopenia, unspecified: Secondary | ICD-10-CM | POA: Diagnosis not present

## 2018-11-26 DIAGNOSIS — Z794 Long term (current) use of insulin: Secondary | ICD-10-CM | POA: Insufficient documentation

## 2018-11-26 DIAGNOSIS — F909 Attention-deficit hyperactivity disorder, unspecified type: Secondary | ICD-10-CM | POA: Insufficient documentation

## 2018-11-26 DIAGNOSIS — Z791 Long term (current) use of non-steroidal anti-inflammatories (NSAID): Secondary | ICD-10-CM | POA: Insufficient documentation

## 2018-11-26 DIAGNOSIS — K7689 Other specified diseases of liver: Secondary | ICD-10-CM | POA: Diagnosis present

## 2018-11-26 DIAGNOSIS — R05 Cough: Secondary | ICD-10-CM | POA: Diagnosis not present

## 2018-11-26 DIAGNOSIS — E119 Type 2 diabetes mellitus without complications: Secondary | ICD-10-CM | POA: Diagnosis not present

## 2018-11-26 DIAGNOSIS — F319 Bipolar disorder, unspecified: Secondary | ICD-10-CM | POA: Diagnosis not present

## 2018-11-26 DIAGNOSIS — I825Y2 Chronic embolism and thrombosis of unspecified deep veins of left proximal lower extremity: Secondary | ICD-10-CM | POA: Diagnosis present

## 2018-11-26 DIAGNOSIS — F17210 Nicotine dependence, cigarettes, uncomplicated: Secondary | ICD-10-CM | POA: Diagnosis not present

## 2018-11-26 DIAGNOSIS — I10 Essential (primary) hypertension: Secondary | ICD-10-CM | POA: Diagnosis not present

## 2018-11-26 DIAGNOSIS — I2699 Other pulmonary embolism without acute cor pulmonale: Secondary | ICD-10-CM | POA: Diagnosis not present

## 2018-11-26 DIAGNOSIS — G894 Chronic pain syndrome: Secondary | ICD-10-CM | POA: Diagnosis present

## 2018-11-26 DIAGNOSIS — Z6841 Body Mass Index (BMI) 40.0 and over, adult: Secondary | ICD-10-CM | POA: Diagnosis not present

## 2018-11-26 DIAGNOSIS — Z79899 Other long term (current) drug therapy: Secondary | ICD-10-CM | POA: Insufficient documentation

## 2018-11-26 DIAGNOSIS — R161 Splenomegaly, not elsewhere classified: Secondary | ICD-10-CM | POA: Diagnosis present

## 2018-11-26 DIAGNOSIS — Z86718 Personal history of other venous thrombosis and embolism: Secondary | ICD-10-CM | POA: Insufficient documentation

## 2018-11-26 LAB — CBC WITH DIFFERENTIAL/PLATELET
Abs Immature Granulocytes: 0.04 10*3/uL (ref 0.00–0.07)
Basophils Absolute: 0 10*3/uL (ref 0.0–0.1)
Basophils Relative: 0 %
Eosinophils Absolute: 0.3 10*3/uL (ref 0.0–0.5)
Eosinophils Relative: 3 %
HEMATOCRIT: 47.9 % (ref 39.0–52.0)
Hemoglobin: 15.2 g/dL (ref 13.0–17.0)
Immature Granulocytes: 1 %
LYMPHS ABS: 1.8 10*3/uL (ref 0.7–4.0)
Lymphocytes Relative: 22 %
MCH: 26.9 pg (ref 26.0–34.0)
MCHC: 31.7 g/dL (ref 30.0–36.0)
MCV: 84.8 fL (ref 80.0–100.0)
Monocytes Absolute: 0.5 10*3/uL (ref 0.1–1.0)
Monocytes Relative: 7 %
Neutro Abs: 5.3 10*3/uL (ref 1.7–7.7)
Neutrophils Relative %: 67 %
Platelets: 124 10*3/uL — ABNORMAL LOW (ref 150–400)
RBC: 5.65 MIL/uL (ref 4.22–5.81)
RDW: 12.9 % (ref 11.5–15.5)
WBC: 8 10*3/uL (ref 4.0–10.5)
nRBC: 0 % (ref 0.0–0.2)

## 2018-11-26 LAB — COMPREHENSIVE METABOLIC PANEL
ALT: 27 U/L (ref 0–44)
AST: 21 U/L (ref 15–41)
Albumin: 3.4 g/dL — ABNORMAL LOW (ref 3.5–5.0)
Alkaline Phosphatase: 65 U/L (ref 38–126)
Anion gap: 7 (ref 5–15)
BUN: <5 mg/dL — ABNORMAL LOW (ref 6–20)
CO2: 28 mmol/L (ref 22–32)
Calcium: 9.3 mg/dL (ref 8.9–10.3)
Chloride: 103 mmol/L (ref 98–111)
Creatinine, Ser: 0.76 mg/dL (ref 0.61–1.24)
GFR calc non Af Amer: >60 mL/min (ref >60)
Glucose, Bld: 183 mg/dL — ABNORMAL HIGH (ref 70–99)
Potassium: 4.2 mmol/L (ref 3.5–5.1)
Sodium: 138 mmol/L (ref 135–145)
Total Bilirubin: 0.9 mg/dL (ref 0.3–1.2)
Total Protein: 6.8 g/dL (ref 6.5–8.1)

## 2018-11-26 NOTE — ED Triage Notes (Signed)
Patient reports persistent productive cough with bloody phlegm onset this morning , pt. added left shoulder pain , denies injury.

## 2018-11-27 ENCOUNTER — Encounter (HOSPITAL_COMMUNITY): Payer: Self-pay | Admitting: Family Medicine

## 2018-11-27 ENCOUNTER — Other Ambulatory Visit: Payer: Self-pay

## 2018-11-27 ENCOUNTER — Emergency Department (HOSPITAL_COMMUNITY): Payer: Medicare Other

## 2018-11-27 ENCOUNTER — Observation Stay (HOSPITAL_BASED_OUTPATIENT_CLINIC_OR_DEPARTMENT_OTHER): Payer: Medicare Other

## 2018-11-27 DIAGNOSIS — R161 Splenomegaly, not elsewhere classified: Secondary | ICD-10-CM

## 2018-11-27 DIAGNOSIS — K7689 Other specified diseases of liver: Secondary | ICD-10-CM | POA: Diagnosis present

## 2018-11-27 DIAGNOSIS — Z791 Long term (current) use of non-steroidal anti-inflammatories (NSAID): Secondary | ICD-10-CM | POA: Diagnosis not present

## 2018-11-27 DIAGNOSIS — F319 Bipolar disorder, unspecified: Secondary | ICD-10-CM | POA: Diagnosis not present

## 2018-11-27 DIAGNOSIS — G894 Chronic pain syndrome: Secondary | ICD-10-CM

## 2018-11-27 DIAGNOSIS — Z86718 Personal history of other venous thrombosis and embolism: Secondary | ICD-10-CM | POA: Diagnosis not present

## 2018-11-27 DIAGNOSIS — Z794 Long term (current) use of insulin: Secondary | ICD-10-CM

## 2018-11-27 DIAGNOSIS — E119 Type 2 diabetes mellitus without complications: Secondary | ICD-10-CM | POA: Diagnosis not present

## 2018-11-27 DIAGNOSIS — I2699 Other pulmonary embolism without acute cor pulmonale: Secondary | ICD-10-CM

## 2018-11-27 DIAGNOSIS — D696 Thrombocytopenia, unspecified: Secondary | ICD-10-CM | POA: Diagnosis present

## 2018-11-27 DIAGNOSIS — K7460 Unspecified cirrhosis of liver: Secondary | ICD-10-CM

## 2018-11-27 DIAGNOSIS — F909 Attention-deficit hyperactivity disorder, unspecified type: Secondary | ICD-10-CM | POA: Diagnosis not present

## 2018-11-27 DIAGNOSIS — E1140 Type 2 diabetes mellitus with diabetic neuropathy, unspecified: Secondary | ICD-10-CM | POA: Diagnosis not present

## 2018-11-27 DIAGNOSIS — F17210 Nicotine dependence, cigarettes, uncomplicated: Secondary | ICD-10-CM | POA: Diagnosis not present

## 2018-11-27 DIAGNOSIS — I10 Essential (primary) hypertension: Secondary | ICD-10-CM | POA: Diagnosis not present

## 2018-11-27 DIAGNOSIS — Z79899 Other long term (current) drug therapy: Secondary | ICD-10-CM | POA: Diagnosis not present

## 2018-11-27 LAB — D-DIMER, QUANTITATIVE: D-Dimer, Quant: 3.41 ug/mL-FEU — ABNORMAL HIGH (ref 0.00–0.50)

## 2018-11-27 LAB — CBC
HCT: 40.9 % (ref 39.0–52.0)
Hemoglobin: 13.7 g/dL (ref 13.0–17.0)
MCH: 28 pg (ref 26.0–34.0)
MCHC: 33.5 g/dL (ref 30.0–36.0)
MCV: 83.5 fL (ref 80.0–100.0)
Platelets: 99 10*3/uL — ABNORMAL LOW (ref 150–400)
RBC: 4.9 MIL/uL (ref 4.22–5.81)
RDW: 13.2 % (ref 11.5–15.5)
WBC: 6 10*3/uL (ref 4.0–10.5)
nRBC: 0 % (ref 0.0–0.2)

## 2018-11-27 LAB — GLUCOSE, CAPILLARY
Glucose-Capillary: 249 mg/dL — ABNORMAL HIGH (ref 70–99)
Glucose-Capillary: 246 mg/dL — ABNORMAL HIGH (ref 70–99)
Glucose-Capillary: 255 mg/dL — ABNORMAL HIGH (ref 70–99)

## 2018-11-27 LAB — HIV ANTIBODY (ROUTINE TESTING W REFLEX): HIV Screen 4th Generation wRfx: NONREACTIVE

## 2018-11-27 LAB — TROPONIN I: Troponin I: <0.03 ng/mL (ref <0.03)

## 2018-11-27 LAB — HEPARIN LEVEL (UNFRACTIONATED): Heparin Unfractionated: 0.3 IU/mL (ref 0.30–0.70)

## 2018-11-27 LAB — ECHOCARDIOGRAM COMPLETE

## 2018-11-27 MED ORDER — INSULIN GLARGINE 100 UNIT/ML ~~LOC~~ SOLN
10.0000 [IU] | Freq: Once | SUBCUTANEOUS | Status: AC
  Administered 2018-11-27: 10 [IU] via SUBCUTANEOUS
  Filled 2018-11-27: qty 0.1

## 2018-11-27 MED ORDER — TRAMADOL HCL 50 MG PO TABS
50.0000 mg | ORAL_TABLET | Freq: Once | ORAL | Status: AC
  Administered 2018-11-27: 50 mg via ORAL
  Filled 2018-11-27: qty 1

## 2018-11-27 MED ORDER — IOHEXOL 350 MG/ML SOLN
100.0000 mL | Freq: Once | INTRAVENOUS | Status: AC | PRN
  Administered 2018-11-27: 100 mL via INTRAVENOUS

## 2018-11-27 MED ORDER — SODIUM CHLORIDE 0.9% FLUSH: 3.0000 mL | INTRAVENOUS | Status: DC | PRN

## 2018-11-27 MED ORDER — IPRATROPIUM-ALBUTEROL 0.5-2.5 (3) MG/3ML IN SOLN
3.0000 mL | Freq: Once | RESPIRATORY_TRACT | Status: AC
  Administered 2018-11-27: 3 mL via RESPIRATORY_TRACT
  Filled 2018-11-27: qty 3

## 2018-11-27 MED ORDER — HYDROCODONE-ACETAMINOPHEN 5-325 MG PO TABS: 1.0000 | ORAL_TABLET | ORAL | 0 refills | Status: DC | PRN

## 2018-11-27 MED ORDER — ONDANSETRON HCL 4 MG/2ML IJ SOLN: 4.0000 mg | Freq: Four times a day (QID) | INTRAMUSCULAR | Status: DC | PRN

## 2018-11-27 MED ORDER — PERFLUTREN LIPID MICROSPHERE
1.0000 mL | INTRAVENOUS | Status: AC | PRN
  Administered 2018-11-27: 2 mL via INTRAVENOUS
  Filled 2018-11-27: qty 10

## 2018-11-27 MED ORDER — ONDANSETRON HCL 4 MG PO TABS: 4.0000 mg | ORAL_TABLET | Freq: Four times a day (QID) | ORAL | Status: DC | PRN

## 2018-11-27 MED ORDER — ENOXAPARIN SODIUM 150 MG/ML ~~LOC~~ SOLN
150.0000 mg | Freq: Two times a day (BID) | SUBCUTANEOUS | Status: DC
  Administered 2018-11-27: 150 mg via SUBCUTANEOUS
  Filled 2018-11-27 (×2): qty 1

## 2018-11-27 MED ORDER — ENOXAPARIN SODIUM 150 MG/ML ~~LOC~~ SOLN: 150.0000 mg | Freq: Two times a day (BID) | SUBCUTANEOUS | 0 refills | Status: DC

## 2018-11-27 MED ORDER — INSULIN GLARGINE 100 UNIT/ML ~~LOC~~ SOLN
5.0000 [IU] | Freq: Every day | SUBCUTANEOUS | Status: DC
  Filled 2018-11-27: qty 0.05

## 2018-11-27 MED ORDER — SODIUM CHLORIDE 0.9 % IV SOLN: 250.0000 mL | INTRAVENOUS | Status: DC | PRN

## 2018-11-27 MED ORDER — SODIUM CHLORIDE 0.9 % IV SOLN
INTRAVENOUS | Status: AC
  Administered 2018-11-27: 07:00:00 via INTRAVENOUS

## 2018-11-27 MED ORDER — SODIUM CHLORIDE 0.9% FLUSH: 3.0000 mL | Freq: Two times a day (BID) | INTRAVENOUS | Status: DC

## 2018-11-27 MED ORDER — IPRATROPIUM-ALBUTEROL 0.5-2.5 (3) MG/3ML IN SOLN: 3.0000 mL | RESPIRATORY_TRACT | Status: DC | PRN

## 2018-11-27 MED ORDER — HYDROCODONE-ACETAMINOPHEN 5-325 MG PO TABS: 1.0000 | ORAL_TABLET | ORAL | Status: DC | PRN

## 2018-11-27 MED ORDER — INSULIN ASPART 100 UNIT/ML ~~LOC~~ SOLN: 0.0000 [IU] | Freq: Every day | SUBCUTANEOUS | Status: DC

## 2018-11-27 MED ORDER — WARFARIN SODIUM 10 MG PO TABS: 10.0000 mg | ORAL_TABLET | Freq: Once | ORAL | 0 refills | Status: DC

## 2018-11-27 MED ORDER — WARFARIN - PHARMACIST DOSING INPATIENT: Freq: Every day | Status: DC

## 2018-11-27 MED ORDER — HEPARIN BOLUS VIA INFUSION
6000.0000 [IU] | Freq: Once | INTRAVENOUS | Status: AC
  Administered 2018-11-27: 6000 [IU] via INTRAVENOUS
  Filled 2018-11-27: qty 6000

## 2018-11-27 MED ORDER — METOCLOPRAMIDE HCL 5 MG PO TABS
5.0000 mg | ORAL_TABLET | Freq: Three times a day (TID) | ORAL | Status: DC
  Filled 2018-11-27: qty 1

## 2018-11-27 MED ORDER — POLYETHYLENE GLYCOL 3350 17 G PO PACK: 17.0000 g | PACK | Freq: Every day | ORAL | Status: DC | PRN

## 2018-11-27 MED ORDER — ACETAMINOPHEN 325 MG PO TABS: 650.0000 mg | ORAL_TABLET | Freq: Four times a day (QID) | ORAL | Status: DC | PRN

## 2018-11-27 MED ORDER — TRAMADOL HCL 50 MG PO TABS
50.0000 mg | ORAL_TABLET | Freq: Four times a day (QID) | ORAL | Status: DC | PRN
  Administered 2018-11-27: 50 mg via ORAL
  Filled 2018-11-27: qty 1

## 2018-11-27 MED ORDER — BISACODYL 5 MG PO TBEC: 5.0000 mg | DELAYED_RELEASE_TABLET | Freq: Every day | ORAL | Status: DC | PRN

## 2018-11-27 MED ORDER — WARFARIN SODIUM 10 MG PO TABS
10.0000 mg | ORAL_TABLET | Freq: Once | ORAL | Status: AC
  Administered 2018-11-27: 10 mg via ORAL
  Filled 2018-11-27: qty 1

## 2018-11-27 MED ORDER — INSULIN ASPART 100 UNIT/ML ~~LOC~~ SOLN
0.0000 [IU] | Freq: Three times a day (TID) | SUBCUTANEOUS | Status: DC
  Administered 2018-11-27: 3 [IU] via SUBCUTANEOUS
  Administered 2018-11-27: 5 [IU] via SUBCUTANEOUS

## 2018-11-27 MED ORDER — HEPARIN (PORCINE) 25000 UT/250ML-% IV SOLN
2100.0000 [IU]/h | INTRAVENOUS | Status: DC
  Administered 2018-11-27: 2100 [IU]/h via INTRAVENOUS
  Filled 2018-11-27: qty 250

## 2018-11-27 MED ORDER — ACETAMINOPHEN 650 MG RE SUPP: 650.0000 mg | Freq: Four times a day (QID) | RECTAL | Status: DC | PRN

## 2018-11-27 NOTE — Progress Notes (Signed)
*  PRELIMINARY RESULTS* Echocardiogram 2D Echocardiogram WITH DEFINITY has been performed.  Bradley [name] 11/27/2018, 1:46 PM

## 2018-11-27 NOTE — ED Notes (Signed)
The pt reports that sinced yesterday he has been coughing up blood medium colored  With some lt middle of his chest

## 2018-11-27 NOTE — Progress Notes (Signed)
Patient admitted from the ED. A/o X4, ambulatory. Vitals checked and stable. On hep drip @21  units. Maden comfortable in bed and call bell placed within reach.

## 2018-11-27 NOTE — ED Provider Notes (Signed)
[facility] EMERGENCY DEPARTMENT Provider Note   CSN: [id number] Arrival date & time: 11/26/18  2126    History   Chief Complaint Chief Complaint  Patient presents with  . Cough  . Hemoptysis    HPI Bradley [name] is a 38 y.o. male.     38 year old male with a history of bipolar disorder, diabetes, prior DVT presents to the emergency department for evaluation of chest pain.  Reports that he has been experiencing 1.5 weeks of left shoulder pain which is aggravated with inspiration and coughing.  He was previously treated for presumed bronchitis.  Completed a course of doxycycline for this.  Was also using an albuterol inhaler as needed.  Has noted increased lightheadedness despite previous treatments.  Also has had hemoptysis with onset this morning.  Denies any new or worsening leg swelling or associated fever, syncope, vomiting.  No recent surgeries or hospitalizations.  Denies prolonged travel.  The history is provided by the patient. No language interpreter was used.  Cough    Past Medical History:  Diagnosis Date  . ADHD   . ADHD (attention deficit hyperactivity disorder)   . Bipolar 1 disorder ([place])   . Bipolar disorder ([place])   . Diabetes mellitus without complication ([place])   . Morbid obesity ([place])   . Obesity   . Panic attack   . Varicose veins of both lower extremities with pain     Patient Active Problem List   Diagnosis Date Noted  . Diabetic neuropathy ([place]) 05/12/2018  . Chronic pain syndrome 05/20/2017  . Bipolar disorder, current episode mixed, moderate ([place]) 03/04/2017  . Varicose veins of both lower extremities with pain 02/18/2017  . Chronic deep vein thrombosis (DVT) of proximal vein of left lower extremity ([place]) 11/12/2016  . Smoking addiction 06/19/2016  . HTN (hypertension) 07/27/2013  . Morbid obesity ([place]) 07/27/2013  . Bipolar 1 disorder, mixed, moderate ([place]) 07/27/2013  . DM2 (diabetes mellitus, type 2) ([place]) 04/26/2013      Past Surgical History:  Procedure Laterality Date  . surgery on meatus as a child          Home Medications    Prior to Admission medications   Medication Sig Start Date End Date Taking? Authorizing Provider  albuterol (PROVENTIL HFA;VENTOLIN HFA) 108 (90 Base) MCG/ACT inhaler Inhale 1-2 puffs into the lungs every 6 (six) hours as needed for wheezing or shortness of breath. 11/06/18   Wieters, Hallie C, PA-C  brompheniramine-pseudoephedrine-DM 30-2-10 MG/5ML syrup Take 5 mLs by mouth 4 (four) times daily as needed. 11/06/18   Wieters, Hallie C, PA-C  glipiZIDE (GLUCOTROL) 10 MG tablet Take 1 tablet (10 mg total) by mouth 2 (two) times daily before a meal. 11/15/18   Charlott Rakes, MD  glucose monitoring kit (FREESTYLE) monitoring kit 1 each by Does not apply route 4 (four) times daily - after meals and at bedtime. 1 month Diabetic Testing Supplies for QAC-QHS accuchecks. 07/27/13   Thurnell Lose, MD  ibuprofen (ADVIL,MOTRIN) 200 MG tablet Take 800 mg by mouth every 8 (eight) hours as needed for moderate pain.    [provider]  Insulin Glargine (BASAGLAR KWIKPEN) 100 UNIT/ML SOPN Inject 0.1 mLs (10 Units total) into the skin at bedtime. 11/16/18   Charlott Rakes, MD  Insulin Pen Needle (TRUEPLUS PEN NEEDLES) 32G X 4 MM MISC Use as directed to administer victoza Patient not taking: Reported on 11/15/2018 08/16/18   Charlott Rakes, MD  ipratropium-albuterol (DUONEB) 0.5-2.5 (3) MG/3ML SOLN  Take 3 mLs by nebulization every 4 (four) hours as needed. Patient not taking: Reported on 11/15/2018 11/06/18   Wieters, Madelynn Done C, PA-C  Menthol (ICY HOT ADVANCED RELIEF) 7.5 % PTCH Apply 1 patch topically as needed (pain).    [provider]  metFORMIN (GLUCOPHAGE-XR) 500 MG 24 hr tablet TAKE 2 TABLETS WITH BREAKFAST 11/15/18   Charlott Rakes, MD  metoCLOPramide (REGLAN) 5 MG tablet Take 1 tablet (5 mg total) by mouth 3 (three) times daily before meals. 11/15/18   Charlott Rakes, MD   naproxen (NAPROSYN) 500 MG tablet Take 1 tablet (500 mg total) by mouth 2 (two) times daily. 10/13/17   Blanchie Dessert, MD  naproxen sodium (ANAPROX) 220 MG tablet Take 440 mg by mouth 2 (two) times daily with a meal.    [provider]  traMADol (ULTRAM) 50 MG tablet Take 1 tablet (50 mg total) by mouth every 12 (twelve) hours as needed. 11/15/18   Charlott Rakes, MD    Family History Family History  Problem Relation Age of Onset  . Rheum arthritis Mother   . Diabetes Mother   . Heart failure Mother   . Stroke Mother     Social History Social History   Tobacco Use  . Smoking status: Current Every Day Smoker    Packs/day: 1.00    Types: Cigarettes  . Smokeless tobacco: Never Used  Substance Use Topics  . Alcohol use: Yes    Comment: Rarely  . Drug use: No     Allergies   Gabapentin and Lyrica [pregabalin]   Review of Systems Review of Systems  Respiratory: Positive for cough.   Ten systems reviewed and are negative for acute change, except as noted in the HPI.    Physical Exam Updated Vital Signs BP 120/81   Pulse 89   Temp 98.1 F (36.7 C) (Oral)   Resp 18   SpO2 97%   Physical Exam Vitals signs and nursing note reviewed.  Constitutional:      General: He is not in acute distress.    Appearance: He is well-developed. He is not diaphoretic.     Comments: Nontoxic appearing. Obese.  HENT:     Head: Normocephalic and atraumatic.     Mouth/Throat:     Comments: Dental decay and caries to central and lateral upper incisors.  Eyes:     General: No scleral icterus.    Conjunctiva/sclera: Conjunctivae normal.  Neck:     Musculoskeletal: Normal range of motion.  Cardiovascular:     Rate and Rhythm: Normal rate and regular rhythm.     Pulses: Normal pulses.  Pulmonary:     Effort: Pulmonary effort is normal. No respiratory distress.     Breath sounds: Wheezing (diffuse, expiratory) present.     Comments: Pain with inspiration. Chest expansion  symmetric. Musculoskeletal: Normal range of motion.  Skin:    General: Skin is warm and dry.     Coloration: Skin is not pale.     Findings: No erythema or rash.  Neurological:     Mental Status: He is alert and oriented to person, place, and time.     Coordination: Coordination normal.  Psychiatric:        Behavior: Behavior normal.      ED Treatments / Results  Labs (all labs ordered are listed, but only abnormal results are displayed) Labs Reviewed  CBC WITH DIFFERENTIAL/PLATELET - Abnormal; Notable for the following components:      Result Value   Platelets  124 (*)    All other components within normal limits  COMPREHENSIVE METABOLIC PANEL - Abnormal; Notable for the following components:   Glucose, Bld 183 (*)    BUN <5 (*)    Albumin 3.4 (*)    All other components within normal limits  D-DIMER, QUANTITATIVE (NOT AT [facility]) - Abnormal; Notable for the following components:   D-Dimer, Quant 3.41 (*)    All other components within normal limits    EKG None  Radiology Dg Chest 2 View  Result Date: 11/26/2018 CLINICAL DATA:  Cough, hemoptysis EXAM: CHEST - 2 VIEW COMPARISON:  11/06/2018 FINDINGS: Heart is normal size. Slight increased markings in the lung bases, similar to prior study. Mild peribronchial thickening. No confluent opacities or effusions. No acute bony abnormality. IMPRESSION: Mild peribronchial thickening, likely bronchitic changes. Slight increased markings in the lung bases, favor atelectasis. Electronically Signed   By: Rolm Baptise M.D.   On: 11/26/2018 23:09   Ct Angio Chest Pe W And/or Wo Contrast  Result Date: 11/27/2018 CLINICAL DATA:  Hemoptysis with persistent productive cough. Left shoulder pain. EXAM: CT ANGIOGRAPHY CHEST WITH CONTRAST TECHNIQUE: Multidetector CT imaging of the chest was performed using the standard protocol during bolus administration of intravenous contrast. Multiplanar CT image reconstructions and MIPs were obtained to evaluate  the vascular anatomy. CONTRAST:  164m OMNIPAQUE IOHEXOL 350 MG/ML SOLN COMPARISON:  None. FINDINGS: Cardiovascular: Technically limited study due to motion and quantum mottling. There is good opacification of the central and segmental pulmonary arteries. Filling defects are demonstrated in the distal right and left main pulmonary arteries and extending into upper and lower lobe branch vessels bilaterally consistent with acute pulmonary emboli. RV to LV ratio is normal at 0.83. No changes suggestive of right heart failure. Normal heart size. No pericardial effusions. Normal caliber thoracic aorta. Great vessel origins are patent. Mediastinum/Nodes: Scattered mediastinal lymph nodes without pathologic enlargement, likely reactive. Esophagus is decompressed. Lungs/Pleura: Motion artifact limits examination. Patchy focal areas of consolidation demonstrated throughout the left upper lung. This may represent pneumonia or pulmonary infarcts. Right lung is mostly clear. No pleural effusions. No pneumothorax. Airways are patent. Upper Abdomen: Possible hepatic cirrhosis with somewhat nodular contour to the liver and prominence of the caudate lobe. Spleen appears enlarged. Musculoskeletal: Degenerative changes in the spine. No destructive bone lesions. Review of the MIP images confirms the above findings. IMPRESSION: 1. Positive examination for bilateral acute pulmonary emboli. No evidence of right heart strain. 2. Patchy areas of consolidation in the left upper lung may represent pneumonia or pulmonary infarcts. 3. Possible hepatic cirrhosis with splenic enlargement. These results were called by telephone at the time of interpretation on 11/27/2018 at 3:50 am to Dr. KAntonietta Breach, who verbally acknowledged these results. Electronically Signed   By: WLucienne CapersM.D.   On: 11/27/2018 03:53    Procedures Procedures (including critical care time)  Medications Ordered in ED Medications  traMADol (ULTRAM) tablet 50 mg  (50 mg Oral Given 11/27/18 0302)  ipratropium-albuterol (DUONEB) 0.5-2.5 (3) MG/3ML nebulizer solution 3 mL (3 mLs Nebulization Given 11/27/18 0302)  insulin glargine (LANTUS) injection 10 Units (10 Units Subcutaneous Given 11/27/18 0345)  iohexol (OMNIPAQUE) 350 MG/ML injection 100 mL (100 mLs Intravenous Contrast Given 11/27/18 0325)    CRITICAL CARE Performed by: KAntonietta Breach  Total critical care time: 35 minutes  Critical care time was exclusive of separately billable procedures and treating other patients.  Critical care was necessary to treat or prevent imminent or life-threatening  deterioration.  Critical care was time spent personally by me on the following activities: development of treatment plan with patient and/or surrogate as well as nursing, discussions with consultants, evaluation of patient's response to treatment, examination of patient, obtaining history from patient or surrogate, ordering and performing treatments and interventions, ordering and review of laboratory studies, ordering and review of radiographic studies, pulse oximetry and re-evaluation of patient's condition.   Initial Impression / Assessment and Plan / ED Course  I have reviewed the triage vital signs and the nursing notes.  Pertinent labs & imaging results that were available during my care of the patient were reviewed by me and considered in my medical decision making (see chart for details).        38 year old male presenting for 1.5 weeks of pleuritic chest pain.  Onset of hemoptysis this morning.  He has been hemodynamically stable without hypoxia.  CT shows evidence of bilateral pulmonary emboli.  No evidence of right heart strain.  The patient was started on IV heparin.  Dr. Myna Hidalgo to admit.  Vitals:   11/26/18 2227 11/27/18 0059  BP: 136/79 120/81  Pulse: 94 89  Resp: 19 18  Temp: 97.7 F (36.5 C) 98.1 F (36.7 C)  TempSrc: Oral Oral  SpO2: 97% 97%    Final Clinical Impressions(s) / ED  Diagnoses   Final diagnoses:  Acute pulmonary embolism without acute cor pulmonale, unspecified pulmonary embolism type [facility])    ED Discharge Orders    None       Antonietta Breach, PA-C 11/27/18 0423    Palumbo, April, MD 11/27/18 [phone]

## 2018-11-27 NOTE — H&P (Signed)
History and Physical    Bradley [name] OIN:[id number] DOB: May 17, 1981 DOA: 11/26/2018  PCP: Charlott Rakes, MD   Patient coming from: Home   Chief Complaint: left upper chest pain, cough with bloody sputum   HPI: Bradley [name] is a 38 y.o. male with medical history significant for obesity, bipolar disorder, insulin-dependent diabetes mellitus, and history of DVT in 2017 status post 1 year of Xarelto, now presenting to the emergency department with pleuritic pain in the upper left chest and cough with scant hemoptysis.  Patient developed cough with blood-tinged sputum approximately 10 days ago, was having pleuritic pain in the upper left chest, completed a course of doxycycline for suspected bronchitis, but continues to have this pleuritic pain and hemoptysis.  He denies any fevers or chills.  Reports that he tolerated Xarelto without incident.  Reports a family history of DVT in his mother.  Denies any recent surgery, prolonged immobilization, or new medications.  Reports chronic bilateral lower extremity swelling and discoloration.  ED Course: Upon arrival to the ED, patient is found to be afebrile, saturating well on room air, and with vitals otherwise normal.  Chemistry panel is unremarkable and CBC notable for mild thrombocytopenia.  D-dimer is elevated to 3.41 and CTA chest reveals bilateral pulmonary embolism without heart strain, and with patchy area of consolidation in the left upper lung possibly reflecting infarct or pneumonia, as well as nodular liver appearance and enlarged spleen.  Patient was given 10 units of insulin, DuoNeb, Ultram, and started on IV heparin infusion.  He remains hemodynamically stable and will be observed for further evaluation and management.  Review of Systems:  All other systems reviewed and apart from HPI, are negative.  Past Medical History:  Diagnosis Date  . ADHD   . ADHD (attention deficit hyperactivity disorder)   . Bipolar 1 disorder ([place])   .  Bipolar disorder ([place])   . Diabetes mellitus without complication ([place])   . Morbid obesity ([place])   . Obesity   . Panic attack   . Varicose veins of both lower extremities with pain     Past Surgical History:  Procedure Laterality Date  . surgery on meatus as a child       reports that he has been smoking cigarettes. He has been smoking about 1.00 pack per day. He has never used smokeless tobacco. He reports current alcohol use. He reports that he does not use drugs.  Allergies  Allergen Reactions  . Gabapentin Other (See Comments)    Crying spells  . Lyrica [Pregabalin] Other (See Comments)    Makes the patient somnolent    Family History  Problem Relation Age of Onset  . Rheum arthritis Mother   . Diabetes Mother   . Heart failure Mother   . Stroke Mother      Prior to Admission medications   Medication Sig Start Date End Date Taking? Authorizing Provider  albuterol (PROVENTIL HFA;VENTOLIN HFA) 108 (90 Base) MCG/ACT inhaler Inhale 1-2 puffs into the lungs every 6 (six) hours as needed for wheezing or shortness of breath. 11/06/18   Wieters, Hallie C, PA-C  brompheniramine-pseudoephedrine-DM 30-2-10 MG/5ML syrup Take 5 mLs by mouth 4 (four) times daily as needed. 11/06/18   Wieters, Hallie C, PA-C  glipiZIDE (GLUCOTROL) 10 MG tablet Take 1 tablet (10 mg total) by mouth 2 (two) times daily before a meal. 11/15/18   Charlott Rakes, MD  glucose monitoring kit (FREESTYLE) monitoring kit 1 each by Does not apply route 4 (  four) times daily - after meals and at bedtime. 1 month Diabetic Testing Supplies for QAC-QHS accuchecks. 07/27/13   Thurnell Lose, MD  ibuprofen (ADVIL,MOTRIN) 200 MG tablet Take 800 mg by mouth every 8 (eight) hours as needed for moderate pain.    [provider]  Insulin Glargine (BASAGLAR KWIKPEN) 100 UNIT/ML SOPN Inject 0.1 mLs (10 Units total) into the skin at bedtime. 11/16/18   Charlott Rakes, MD  Insulin Pen Needle (TRUEPLUS PEN NEEDLES) 32G X 4  MM MISC Use as directed to administer victoza 08/16/18   Newlin, Enobong, MD  ipratropium-albuterol (DUONEB) 0.5-2.5 (3) MG/3ML SOLN Take 3 mLs by nebulization every 4 (four) hours as needed. 11/06/18   Wieters, Hallie C, PA-C  Menthol (ICY HOT ADVANCED RELIEF) 7.5 % PTCH Apply 1 patch topically as needed (pain).    [provider]  metFORMIN (GLUCOPHAGE-XR) 500 MG 24 hr tablet TAKE 2 TABLETS WITH BREAKFAST 11/15/18   Charlott Rakes, MD  metoCLOPramide (REGLAN) 5 MG tablet Take 1 tablet (5 mg total) by mouth 3 (three) times daily before meals. 11/15/18   Charlott Rakes, MD  naproxen (NAPROSYN) 500 MG tablet Take 1 tablet (500 mg total) by mouth 2 (two) times daily. 10/13/17   Blanchie Dessert, MD  naproxen sodium (ANAPROX) 220 MG tablet Take 440 mg by mouth 2 (two) times daily with a meal.    [provider]  traMADol (ULTRAM) 50 MG tablet Take 1 tablet (50 mg total) by mouth every 12 (twelve) hours as needed. 11/15/18   Charlott Rakes, MD    Physical Exam: Vitals:   11/27/18 0059 11/27/18 0421 11/27/18 0424 11/27/18 0430  BP: 120/81 (!) 130/93  (!) 130/92  Pulse: 89 93 91 87  Resp: 18  (!) 22 17  Temp: 98.1 F (36.7 C)     TempSrc: Oral     SpO2: 97% 93% 97% 97%    Constitutional: NAD, calm  Eyes: PERTLA, lids and conjunctivae normal ENMT: Mucous membranes are moist. Posterior pharynx clear of any exudate or lesions.   Neck: normal, supple, no masses, no thyromegaly Respiratory: clear to auscultation bilaterally, no wheezing, no crackles. Normal respiratory effort.    Cardiovascular: S1 & S2 heard, regular rate and rhythm. Bilateral leg edema Lt > Rt. Abdomen: No distension, no tenderness, soft. Bowel sounds normal.  Musculoskeletal: no clubbing / cyanosis. No joint deformity upper and lower extremities.    Skin: no significant rashes, lesions, ulcers. Warm, dry, well-perfused. Neurologic: CN 2-12 grossly intact. Sensation intact. Moving all extremities.  Psychiatric:  Alert and oriented x 3. Calm, cooperative.    Labs on Admission: I have personally reviewed following labs and imaging studies  CBC: Recent Labs  Lab 11/26/18 2238  WBC 8.0  NEUTROABS 5.3  HGB 15.2  HCT 47.9  MCV 84.8  PLT 470*   Basic Metabolic Panel: Recent Labs  Lab 11/26/18 2238  NA 138  K 4.2  CL 103  CO2 28  GLUCOSE 183*  BUN <5*  CREATININE 0.76  CALCIUM 9.3   GFR: Estimated Creatinine Clearance: 198.3 mL/min (by C-G formula based on SCr of 0.76 mg/dL). Liver Function Tests: Recent Labs  Lab 11/26/18 2238  AST 21  ALT 27  ALKPHOS 65  BILITOT 0.9  PROT 6.8  ALBUMIN 3.4*   No results for input(s): LIPASE, AMYLASE in the last 168 hours. No results for input(s): AMMONIA in the last 168 hours. Coagulation Profile: No results for input(s): INR, PROTIME in the last 168 hours.  Cardiac Enzymes: No results for input(s): CKTOTAL, CKMB, CKMBINDEX, TROPONINI in the last 168 hours. BNP (last 3 results) No results for input(s): PROBNP in the last 8760 hours. HbA1C: No results for input(s): HGBA1C in the last 72 hours. CBG: No results for input(s): GLUCAP in the last 168 hours. Lipid Profile: No results for input(s): CHOL, HDL, LDLCALC, TRIG, CHOLHDL, LDLDIRECT in the last 72 hours. Thyroid Function Tests: No results for input(s): TSH, T4TOTAL, FREET4, T3FREE, THYROIDAB in the last 72 hours. Anemia Panel: No results for input(s): VITAMINB12, FOLATE, FERRITIN, TIBC, IRON, RETICCTPCT in the last 72 hours. Urine analysis:    Component Value Date/Time   COLORURINE YELLOW 10/13/2017 0420   APPEARANCEUR CLEAR 10/13/2017 0420   LABSPEC 1.019 10/13/2017 0420   PHURINE 5.0 10/13/2017 0420   GLUCOSEU NEGATIVE 10/13/2017 0420   HGBUR NEGATIVE 10/13/2017 0420   BILIRUBINUR NEGATIVE 10/13/2017 0420   BILIRUBINUR neg 11/12/2016 1515   KETONESUR NEGATIVE 10/13/2017 0420   PROTEINUR NEGATIVE 10/13/2017 0420   UROBILINOGEN 0.2 11/12/2016 1515   UROBILINOGEN 0.2  02/23/2016 1839   NITRITE NEGATIVE 10/13/2017 0420   LEUKOCYTESUR NEGATIVE 10/13/2017 0420   Sepsis Labs: _0 (procalcitonin:4,lacticidven:4) )No results found for this or any previous visit (from the past 240 hour(s)).   Radiological Exams on Admission: Dg Chest 2 View  Result Date: 11/26/2018 CLINICAL DATA:  Cough, hemoptysis EXAM: CHEST - 2 VIEW COMPARISON:  11/06/2018 FINDINGS: Heart is normal size. Slight increased markings in the lung bases, similar to prior study. Mild peribronchial thickening. No confluent opacities or effusions. No acute bony abnormality. IMPRESSION: Mild peribronchial thickening, likely bronchitic changes. Slight increased markings in the lung bases, favor atelectasis. Electronically Signed   By: Rolm Baptise M.D.   On: 11/26/2018 23:09   Ct Angio Chest Pe W And/or Wo Contrast  Result Date: 11/27/2018 CLINICAL DATA:  Hemoptysis with persistent productive cough. Left shoulder pain. EXAM: CT ANGIOGRAPHY CHEST WITH CONTRAST TECHNIQUE: Multidetector CT imaging of the chest was performed using the standard protocol during bolus administration of intravenous contrast. Multiplanar CT image reconstructions and MIPs were obtained to evaluate the vascular anatomy. CONTRAST:  162m OMNIPAQUE IOHEXOL 350 MG/ML SOLN COMPARISON:  None. FINDINGS: Cardiovascular: Technically limited study due to motion and quantum mottling. There is good opacification of the central and segmental pulmonary arteries. Filling defects are demonstrated in the distal right and left main pulmonary arteries and extending into upper and lower lobe branch vessels bilaterally consistent with acute pulmonary emboli. RV to LV ratio is normal at 0.83. No changes suggestive of right heart failure. Normal heart size. No pericardial effusions. Normal caliber thoracic aorta. Great vessel origins are patent. Mediastinum/Nodes: Scattered mediastinal lymph nodes without pathologic enlargement, likely reactive. Esophagus  is decompressed. Lungs/Pleura: Motion artifact limits examination. Patchy focal areas of consolidation demonstrated throughout the left upper lung. This may represent pneumonia or pulmonary infarcts. Right lung is mostly clear. No pleural effusions. No pneumothorax. Airways are patent. Upper Abdomen: Possible hepatic cirrhosis with somewhat nodular contour to the liver and prominence of the caudate lobe. Spleen appears enlarged. Musculoskeletal: Degenerative changes in the spine. No destructive bone lesions. Review of the MIP images confirms the above findings. IMPRESSION: 1. Positive examination for bilateral acute pulmonary emboli. No evidence of right heart strain. 2. Patchy areas of consolidation in the left upper lung may represent pneumonia or pulmonary infarcts. 3. Possible hepatic cirrhosis with splenic enlargement. These results were called by telephone at the time of interpretation on 11/27/2018 at 3:50 am to Dr. KClaiborne Billings  HUMES , who verbally acknowledged these results. Electronically Signed   By: Lucienne Capers M.D.   On: 11/27/2018 03:53    EKG: Ordered, not yet performed.   Assessment/Plan   1. Pulmonary embolism  - Presents with pleuritic chest pain and scant hemoptysis  - Found to have bilateral PE on CTA with no CT-evidence for heart-strain  - Started on IV heparin infusion in ED  - No provacative factors identified  - He has personal hx of LE DVT in 2017 and was taken off of Xarelto after a year; there is FHx of VTE in his mother and hypercoagulable workup considered but already on heparin infusion and will likely need indefinite anticoagulation now  - Continue IV heparin, check EKG and troponin, assess swollen and discolored legs with venous US   2. Insulin-dependent DM  - A1c was 10.1% February 2020  - Managed at home with Lantus 10 units qHS, metformin, and glipizide  - Check CBG's, continue Lantus, and use correctional Novolog while in hospital    3. Thrombocytopenia  -  Platelets 124k on admission, intermittently low in past  - Enlarged spleen and possible cirrhosis noted on CTA chest  - Monitor closely on heparin gtt    4. ?cirrhosis  - CTA chest with incidental notation of nodular liver and enlarged spleen  - LFT's are normal; he reports only rare alcohol use  - Outpatient follow-up recommended     DVT prophylaxis: IV heparin  Code Status: Full  Family Communication: Significant other updated at bedside Consults called: None Admission status: Observation     Vianne Bulls, MD [facility] Pager [phone]  If 7PM-7AM, please contact night-coverage [email or web address] Password [facility]  11/27/2018, 4:43 AM

## 2018-11-27 NOTE — Progress Notes (Signed)
Assisted pt in ordering breakfast  Updated pt and pt family on plan of care Pt verbalizes understanding

## 2018-11-27 NOTE — Discharge Summary (Addendum)
Bradley [name], is a 38 y.o. male  DOB 1981/06/22  MRN [id number].  Admission date:  11/26/2018  Admitting Physician  Vianne Bulls, MD  Discharge Date:  11/27/2018   Primary MD  Charlott Rakes, MD  Recommendations for primary care physician for things to follow:   Discharge Diagnosis   Principal Problem:   Pulmonary emboli [facility]) Active Problems:   DM2 (diabetes mellitus, type 2) ([place])   Chronic deep vein thrombosis (DVT) of proximal vein of left lower extremity (HCC)   Chronic pain syndrome   Thrombocytopenia (HCC)   Splenomegaly   Focal nodular hyperplasia of liver      Past Medical History:  Diagnosis Date  . ADHD   . ADHD (attention deficit hyperactivity disorder)   . Bipolar 1 disorder ([place])   . Bipolar disorder ([place])   . Diabetes mellitus without complication ([place])   . Morbid obesity ([place])   . Obesity   . Panic attack   . Varicose veins of both lower extremities with pain     Past Surgical History:  Procedure Laterality Date  . surgery on meatus as a child         HPI  from the history and physical done on the day of admission:    Bradley [name] is a 38 y.o. male with medical history significant for obesity, bipolar disorder, insulin-dependent diabetes mellitus, and history of DVT in 2017 status post 1 year of Xarelto, now presenting to the emergency department with pleuritic pain in the upper left chest and cough with scant hemoptysis.  Patient developed cough with blood-tinged sputum approximately 10 days ago, was having pleuritic pain in the upper left chest, completed a course of doxycycline for suspected bronchitis, but continues to have this pleuritic pain and hemoptysis.  He denies any fevers or chills.  Reports that he tolerated Xarelto without incident.  Reports a family history of DVT in  his mother.  Denies any recent surgery, prolonged immobilization, or new medications.  Reports chronic bilateral lower extremity swelling and discoloration.  ED Course: Upon arrival to the ED, patient is found to be afebrile, saturating well on room air, and with vitals otherwise normal.  Chemistry panel is unremarkable and CBC notable for mild thrombocytopenia.  D-dimer is elevated to 3.41 and CTA chest reveals bilateral pulmonary embolism without heart strain, and with patchy area of consolidation in the left upper lung possibly reflecting infarct or pneumonia, as well as nodular liver appearance and enlarged spleen.  Patient was given 10 units of insulin, DuoNeb, Ultram, and started on IV heparin infusion.  He remains hemodynamically stable and will be observed for further evaluation and management.     Hospital Course:   1.  Pulmonary embolism, history of DVT: Patient presented with complaints of left upper chest pain cough with hemoptysis.    CT angiogram revealed acute pulmonary embolus.  Patient has been started on a heparin drip. Prior history of DVT in 2017 treated with Xarelto for 1 year.  Patient reported complaints  of left thigh pain.  Vascular Doppler ultrasound negative.  Suspect symptoms could be secondary to hypercoagulable state given family history of mother having multiple DVTs as well along with sedentary lifestyle.  Plan was to switch to Xarelto, but new agents not studied in a patient of his weight.  Pharmacy recommending Coumadin with Lovenox bridging.  Patient requesting to be discharged home.  Prescriptions sent for Lovenox bridging along with Coumadin.  Pharmacy educated patient prior to discharge.  Ambulatory referral to community health and wellness for anticoagulation monitoring on Monday.  Stressed need to go to [employer] and wellness on Monday for INR check.  Counseled on need to refrain from NSAID use.  Patient would benefit from a hypercoagulable work-up and/or  referral to hematology given family history and because this is the second blood clot although he also reports being sedentary.  2.  Insulin-dependent diabetes mellitus: Last hemoglobin A1c 10.1 in 10/2018.  Managed with Lantus 10 units nightly, metformin, and glipizide.  Patient was placed on a sliding scale insulin with Lantus while in the hospital.  He was seen to be drinking soda with bag of chips at bedside.  Discussed need of better compliance with diabetes.  3.  Thrombocytopenia: Platelets 124 on admission.  Could be related with incidental findings of nodular liver and splenomegaly.  4.  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  LFTs were noted to be within normal limits.  Patient denies daily use of alcohol.  Suspect the possibility of Karlene Lineman, but patient would need further testing.  Consider outpatient referral to gastroenterology for further work-up.   Follow UP  Follow-up with community health and wellness on Monday.     Consults obtained : Pharmacy  Discharge Condition: Stable  Diet and Activity recommendation: See Discharge Instructions below Discharge Instructions    Ambulatory referral to Anticoagulation Monitoring   Complete by:  As directed    Responsible Group:  PCP CLINICAL MESSAGE POOL   Date of first INR:  11/29/2018   Discharge instructions   Complete by:  As directed    Follow with Primary MD Charlott Rakes, MD within 1 week.  You are diagnosed with a pulmonary embolus and due to weight we have started you on Coumadin as newer anticoagulation agents have not been studied.  You will need to take Lovenox injections until your INR is therapeutic in the range of 2-3 as there is increased risks of clotting when first starting Coumadin.  It is very important that you follow-up with your primary care provider's office on Monday to have INR levels checked as you may need to be on Lovenox  shots for longer than the 5 days prescribed if INR is not in that 2-3 range.  Due to this being the second occurrence of you having a blood clot along with your family history of blood clotting it is likely recommended that you be on lifelong anticoagulation.  Referrals to a hematologist for a hypercoagulable work-up may be warranted, or your primary care provider can order the initial work-up. Imaging studies performed during your hospitalization showed signs of cirrhosis and an enlarged spleen.  A referral to gastroenterology may be warranted for further evaluation.  Also note that while on blood thinners you should not take ibuprofen, Aleve, BC Goody powders, naproxen, or any other NSAID as this puts you at increased risk of bleeding.  Get CBC, BMP, and INR-  checked  by Primary MD  on2/2/20 ( we routinely change or add medications that can affect your baseline labs and fluid status, therefore we recommend that you get the mentioned basic workup next visit with your PCP, your PCP may decide not to get them or add new tests based on their clinical decision)  Activity: As tolerated   Disposition: Home   Diet: heart healthy/carb modified   Special Instructions: If you have smoked or chewed Tobacco  in the last 2 yrs please stop smoking, stop any regular Alcohol  and or any Recreational drug use.  On your next visit with your primary care physician please Get Medicines reviewed and adjusted.  Please request your Charlott Rakes, MD to go over all Hospital Tests and Procedure/Radiological results at the follow up, please get all Hospital records sent to your Prim MD by signing hospital release before you go home.  If you experience worsening of your admission symptoms, develop shortness of breath, life threatening emergency, suicidal or homicidal thoughts you must seek medical attention immediately by calling 911 or calling your MD immediately  if symptoms less severe.  You Must read complete  instructions/literature along with all the possible adverse reactions/side effects for all the Medicines you take and that have been prescribed to you. Take any new Medicines after you have completely understood and accpet all the possible adverse reactions/side effects.   Do not drive, operate heavy machinery, perform activities at heights, swimming or participation in water activities or provide baby sitting services if your were admitted for syncope or siezures until you have seen by Primary MD or a Neurologist and advised to do so again.  Do not drive when taking Pain medications.  Do not take more than prescribed Pain, Sleep and Anxiety Medications  Wear Seat belts while driving.   Please note  You were cared for by a hospitalist during your hospital stay. If you have any questions about your discharge medications or the care you received while you were in the hospital after you are discharged, you can call the unit and asked to speak with the hospitalist on call if the hospitalist that took care of you is not available. Once you are discharged, your primary care physician will handle any further medical issues. Please note that NO REFILLS for any discharge medications will be authorized once you are discharged, as it is imperative that you return to your primary care physician (or establish a relationship with a primary care physician if you do not have one) for your aftercare needs so that they can reassess your need for medications and monitor your lab values.        Discharge Medications     Allergies as of 11/27/2018      Reactions   Gabapentin Other (See Comments)   Crying spells   Lyrica [pregabalin] Other (See Comments)   Makes the patient somnolent      Medication List    STOP taking these medications   ibuprofen 200 MG tablet Commonly known as:  ADVIL,MOTRIN   ipratropium-albuterol 0.5-2.5 (3) MG/3ML Soln Commonly known as:  DUONEB   metoCLOPramide 5 MG  tablet Commonly known as:  REGLAN   naproxen sodium 220 MG tablet Commonly known as:  ALEVE     TAKE these medications   albuterol 108 (90 Base) MCG/ACT inhaler Commonly known as:  PROVENTIL HFA;VENTOLIN HFA Inhale 1-2 puffs into the lungs every 6 (six) hours as needed for wheezing or shortness of breath.   BASAGLAR KWIKPEN 100 UNIT/ML Sopn Inject  0.1 mLs (10 Units total) into the skin at bedtime.   brompheniramine-pseudoephedrine-DM 30-2-10 MG/5ML syrup Take 5 mLs by mouth 4 (four) times daily as needed.   enoxaparin 150 MG/ML injection Commonly known as:  LOVENOX Inject 1 mL (150 mg total) into the skin every 12 (twelve) hours. Notes to patient:  Subq. in abdomen after cleansing skin, 3 inches away from bellybutton    glipiZIDE 10 MG tablet Commonly known as:  GLUCOTROL Take 1 tablet (10 mg total) by mouth 2 (two) times daily before a meal. What changed:  when to take this   glucose monitoring kit monitoring kit 1 each by Does not apply route 4 (four) times daily - after meals and at bedtime. 1 month Diabetic Testing Supplies for QAC-QHS accuchecks.   HYDROcodone-acetaminophen 5-325 MG tablet Commonly known as:  NORCO/VICODIN Take 1-2 tablets by mouth every 4 (four) hours as needed for severe pain.   ICY HOT ADVANCED RELIEF 7.5 % Ptch Generic drug:  Menthol Apply 1 patch topically as needed (pain).   Insulin Pen Needle 32G X 4 MM Misc Commonly known as:  TRUEPLUS PEN NEEDLES Use as directed to administer victoza   metFORMIN 500 MG 24 hr tablet Commonly known as:  GLUCOPHAGE-XR TAKE 2 TABLETS WITH BREAKFAST What changed:    how much to take  how to take this  when to take this  additional instructions   traMADol 50 MG tablet Commonly known as:  ULTRAM Take 1 tablet (50 mg total) by mouth every 12 (twelve) hours as needed. What changed:  reasons to take this   warfarin 10 MG tablet Commonly known as:  COUMADIN Take 1 tablet (10 mg total) by mouth one  time only at 6 PM. Notes to patient:  Take every day same, 3pm last dose on 11/27/2018       Major procedures and Radiology Reports - PLEASE review detailed and final reports for all details, in brief -    Echocardiogram 11/27/2018: Impression  1. The left ventricle has normal systolic function, with an ejection fraction of 55-60%. The cavity size was normal. Left ventricular diastolic parameters were normal No evidence of left ventricular regional wall motion abnormalities.  2. RV not well visualized but on limited images appears grossly normal without evidence of overt RV strain.  3. The mitral valve is normal in structure.  4. The tricuspid valve is normal in structure.  5. The aortic valve is tricuspid.  6. The aortic root and ascending aorta are normal in size and structure.  7. Technically limited study due to poor sound wave transmission.  8. No evidence of left ventricular regional wall motion abnormalities.  9. The interatrial septum was not assessed.   Dg Chest 2 View  Result Date: 11/26/2018 CLINICAL DATA:  Cough, hemoptysis EXAM: CHEST - 2 VIEW COMPARISON:  11/06/2018 FINDINGS: Heart is normal size. Slight increased markings in the lung bases, similar to prior study. Mild peribronchial thickening. No confluent opacities or effusions. No acute bony abnormality. IMPRESSION: Mild peribronchial thickening, likely bronchitic changes. Slight increased markings in the lung bases, favor atelectasis. Electronically Signed   By: Rolm Baptise M.D.   On: 11/26/2018 23:09   Dg Chest 2 View  Result Date: 11/06/2018 CLINICAL DATA:  Cough for 3 weeks EXAM: CHEST - 2 VIEW COMPARISON:  Chest radiograph 11/19/2016 FINDINGS: Normal cardiac and mediastinal contours. Minimal bibasilar opacities. No pleural effusion or pneumothorax. Thoracic spine degenerative changes. IMPRESSION: Minimal bibasilar opacities favored to represent atelectasis. Infection not  excluded. Electronically Signed   By: Lovey Newcomer M.D.   On: 11/06/2018 13:14   Ct Angio Chest Pe W And/or Wo Contrast  Result Date: 11/27/2018 CLINICAL DATA:  Hemoptysis with persistent productive cough. Left shoulder pain. EXAM: CT ANGIOGRAPHY CHEST WITH CONTRAST TECHNIQUE: Multidetector CT imaging of the chest was performed using the standard protocol during bolus administration of intravenous contrast. Multiplanar CT image reconstructions and MIPs were obtained to evaluate the vascular anatomy. CONTRAST:  157m OMNIPAQUE IOHEXOL 350 MG/ML SOLN COMPARISON:  None. FINDINGS: Cardiovascular: Technically limited study due to motion and quantum mottling. There is good opacification of the central and segmental pulmonary arteries. Filling defects are demonstrated in the distal right and left main pulmonary arteries and extending into upper and lower lobe branch vessels bilaterally consistent with acute pulmonary emboli. RV to LV ratio is normal at 0.83. No changes suggestive of right heart failure. Normal heart size. No pericardial effusions. Normal caliber thoracic aorta. Great vessel origins are patent. Mediastinum/Nodes: Scattered mediastinal lymph nodes without pathologic enlargement, likely reactive. Esophagus is decompressed. Lungs/Pleura: Motion artifact limits examination. Patchy focal areas of consolidation demonstrated throughout the left upper lung. This may represent pneumonia or pulmonary infarcts. Right lung is mostly clear. No pleural effusions. No pneumothorax. Airways are patent. Upper Abdomen: Possible hepatic cirrhosis with somewhat nodular contour to the liver and prominence of the caudate lobe. Spleen appears enlarged. Musculoskeletal: Degenerative changes in the spine. No destructive bone lesions. Review of the MIP images confirms the above findings. IMPRESSION: 1. Positive examination for bilateral acute pulmonary emboli. No evidence of right heart strain. 2. Patchy areas of consolidation in the left upper lung may represent pneumonia  or pulmonary infarcts. 3. Possible hepatic cirrhosis with splenic enlargement. These results were called by telephone at the time of interpretation on 11/27/2018 at 3:50 am to Dr. KAntonietta Breach, who verbally acknowledged these results. Electronically Signed   By: WLucienne CapersM.D.   On: 11/27/2018 03:53   Vas [place] Extremity Venous (dvt)  Result Date: 11/27/2018  Lower Venous Study Indications: Confirmed Pulmonary embolism.  Limitations: Body habitus. Comparison Study: Prior study done 05/22/16 is available for comparison showing                   DVT of Left lower extremity Performing Technologist: CSharion DoveRVS  Examination Guidelines: A complete evaluation includes B-mode imaging, spectral Doppler, color Doppler, and power Doppler as needed of all accessible portions of each vessel. Bilateral testing is considered an integral part of a complete examination. Limited examinations for reoccurring indications may be performed as noted.  Right Venous Findings: +---------+---------------+---------+-----------+----------+--------------+          CompressibilityPhasicitySpontaneityPropertiesSummary        +---------+---------------+---------+-----------+----------+--------------+ CFV      Full           Yes      Yes                                 +---------+---------------+---------+-----------+----------+--------------+ SFJ      Full                                                        +---------+---------------+---------+-----------+----------+--------------+ FV Prox  Full                                                        +---------+---------------+---------+-----------+----------+--------------+  FV Mid   Full                                                        +---------+---------------+---------+-----------+----------+--------------+ FV DistalFull                                                         +---------+---------------+---------+-----------+----------+--------------+ PFV      Full                                                        +---------+---------------+---------+-----------+----------+--------------+ POP      Full           Yes      Yes                                 +---------+---------------+---------+-----------+----------+--------------+ PTV                                                   visualized     +---------+---------------+---------+-----------+----------+--------------+ PERO                                                  Not visualized +---------+---------------+---------+-----------+----------+--------------+  Left Venous Findings: +---------+---------------+---------+-----------+----------+--------------+          CompressibilityPhasicitySpontaneityPropertiesSummary        +---------+---------------+---------+-----------+----------+--------------+ CFV      Full           Yes      Yes                                 +---------+---------------+---------+-----------+----------+--------------+ SFJ      Full                                                        +---------+---------------+---------+-----------+----------+--------------+ FV Prox  Full                                                        +---------+---------------+---------+-----------+----------+--------------+ FV Mid   Full                                                        +---------+---------------+---------+-----------+----------+--------------+  FV DistalFull                                                        +---------+---------------+---------+-----------+----------+--------------+ PFV      Full                                                        +---------+---------------+---------+-----------+----------+--------------+ POP      Full           Yes      Yes                                  +---------+---------------+---------+-----------+----------+--------------+ PTV                                                   visualized     +---------+---------------+---------+-----------+----------+--------------+ PERO                                                  Not visualized +---------+---------------+---------+-----------+----------+--------------+    Summary: Right: There is no evidence of deep vein thrombosis in the lower extremity. However, portions of this examination were limited- see technologist comments above. Left: There is no evidence of deep vein thrombosis in the lower extremity. However, portions of this examination were limited- see technologist comments above.  *See table(s) above for measurements and observations. Electronically signed by Servando Snare MD on 11/27/2018 at 1:24:11 PM.    Final     Micro Results     No results found for this or any previous visit (from the past 240 hour(s)).     Today   Subjective    Bradley [name] today states that he is ready to go home and will follow-up with community health and wellness on Monday to have blood work.  Notes that his mother had history of blood clots as well and this is his second blood clot.  States that he is not as active as he should be it is also possible because of having recurrence of blood clot.   Objective   Blood pressure (!) 142/81, pulse 87, temperature 98 F (36.7 C), temperature source Oral, resp. rate 18, SpO2 94 %.   Intake/Output Summary (Last 24 hours) at 11/27/2018 1452 Last data filed at 11/27/2018 1156 Gross per 24 hour  Intake 1468.01 ml  Output -  Net 1468.01 ml    Exam  Constitutional: Obese male in NAD, calm, comfortable Eyes: PERRL, lids and conjunctivae normal ENMT: Mucous membranes are moist. Posterior pharynx clear of any exudate or lesions.Normal dentition.  Neck: normal, supple, no masses, no thyromegaly Respiratory: clear to auscultation bilaterally, no  wheezing, no crackles. Normal respiratory effort. No accessory muscle use.  Cardiovascular: Regular rate and rhythm, no murmurs / rubs / gallops.  Lower extremity edema noted worse on left leg  as opposed to the right lower extremity. 2+ pedal pulses. No carotid bruits.  Abdomen: no tenderness, no masses palpated. Bowel sounds positive.  Musculoskeletal: no clubbing / cyanosis. No joint deformity upper and lower extremities. Good ROM, no contractures. Normal muscle tone.  Skin: no rashes, lesions, ulcers. No induration Neurologic: CN 2-12 grossly intact. Sensation intact, DTR normal. Strength 5/5 in all 4.  Psychiatric: Normal judgment and insight. Alert and oriented x 3. Normal mood.    Data Review   CBC w Diff:  Lab Results  Component Value Date   WBC 6.0 11/27/2018   HGB 13.7 11/27/2018   HGB 16.5 05/20/2017   HCT 40.9 11/27/2018   HCT 48.5 05/20/2017   PLT 99 (L) 11/27/2018   PLT 140 (L) 05/20/2017   LYMPHOPCT 22 11/26/2018   MONOPCT 7 11/26/2018   EOSPCT 3 11/26/2018   BASOPCT 0 11/26/2018    CMP:  Lab Results  Component Value Date   NA 138 11/26/2018   NA 139 11/19/2018   K 4.2 11/26/2018   CL 103 11/26/2018   CO2 28 11/26/2018   BUN <5 (L) 11/26/2018   BUN 8 11/19/2018   CREATININE 0.76 11/26/2018   CREATININE 0.82 11/12/2016   PROT 6.8 11/26/2018   PROT 6.9 11/19/2018   ALBUMIN 3.4 (L) 11/26/2018   ALBUMIN 3.9 (L) 11/19/2018   BILITOT 0.9 11/26/2018   BILITOT 1.1 11/19/2018   ALKPHOS 65 11/26/2018   AST 21 11/26/2018   ALT 27 11/26/2018  .   Total Time in preparing paper work, data evaluation and todays exam - 35 minutes  Norval Morton M.D on 11/27/2018 at 2:52 PM  [facility]   Office  [phone]

## 2018-11-27 NOTE — Progress Notes (Signed)
ANTICOAGULATION CONSULT NOTE - Initial Consult  Pharmacy Consult for Heparin Indication: pulmonary embolus  Allergies  Allergen Reactions  . Gabapentin     Crying spells  . Lyrica [Pregabalin]     somnolence    Patient Measurements:   Heparin Dosing Weight: 120 kg  Vital Signs: Temp: 98.1 F (36.7 C) (02/29 0059) Temp Source: Oral (02/29 0059) BP: 120/81 (02/29 0059) Pulse Rate: 89 (02/29 0059)  Labs: Recent Labs    11/26/18 2238  HGB 15.2  HCT 47.9  PLT 124*  CREATININE 0.76    Estimated Creatinine Clearance: 198.3 mL/min (by C-G formula based on SCr of 0.76 mg/dL).   Medical History: Past Medical History:  Diagnosis Date  . ADHD   . ADHD (attention deficit hyperactivity disorder)   . Bipolar 1 disorder ([place])   . Bipolar disorder ([place])   . Diabetes mellitus without complication ([place])   . Morbid obesity ([place])   . Obesity   . Panic attack   . Varicose veins of both lower extremities with pain     Medications:  No current facility-administered medications on file prior to encounter.    Current Outpatient Medications on File Prior to Encounter  Medication Sig Dispense Refill  . albuterol (PROVENTIL HFA;VENTOLIN HFA) 108 (90 Base) MCG/ACT inhaler Inhale 1-2 puffs into the lungs every 6 (six) hours as needed for wheezing or shortness of breath. 1 Inhaler 0  . brompheniramine-pseudoephedrine-DM 30-2-10 MG/5ML syrup Take 5 mLs by mouth 4 (four) times daily as needed. 120 mL 0  . glipiZIDE (GLUCOTROL) 10 MG tablet Take 1 tablet (10 mg total) by mouth 2 (two) times daily before a meal. 60 tablet 3  . glucose monitoring kit (FREESTYLE) monitoring kit 1 each by Does not apply route 4 (four) times daily - after meals and at bedtime. 1 month Diabetic Testing Supplies for QAC-QHS accuchecks. 1 each 1  . ibuprofen (ADVIL,MOTRIN) 200 MG tablet Take 800 mg by mouth every 8 (eight) hours as needed for moderate pain.    . Insulin Glargine (BASAGLAR KWIKPEN) 100 UNIT/ML SOPN  Inject 0.1 mLs (10 Units total) into the skin at bedtime. 3 mL 2  . Insulin Pen Needle (TRUEPLUS PEN NEEDLES) 32G X 4 MM MISC Use as directed to administer victoza (Patient not taking: Reported on 11/15/2018) 100 each 3  . ipratropium-albuterol (DUONEB) 0.5-2.5 (3) MG/3ML SOLN Take 3 mLs by nebulization every 4 (four) hours as needed. (Patient not taking: Reported on 11/15/2018) 360 mL 0  . Menthol (ICY HOT ADVANCED RELIEF) 7.5 % PTCH Apply 1 patch topically as needed (pain).    . metFORMIN (GLUCOPHAGE-XR) 500 MG 24 hr tablet TAKE 2 TABLETS WITH BREAKFAST 60 tablet 3  . metoCLOPramide (REGLAN) 5 MG tablet Take 1 tablet (5 mg total) by mouth 3 (three) times daily before meals. 90 tablet 0  . naproxen (NAPROSYN) 500 MG tablet Take 1 tablet (500 mg total) by mouth 2 (two) times daily. 30 tablet 0  . naproxen sodium (ANAPROX) 220 MG tablet Take 440 mg by mouth 2 (two) times daily with a meal.    . traMADol (ULTRAM) 50 MG tablet Take 1 tablet (50 mg total) by mouth every 12 (twelve) hours as needed. 60 tablet 2     Assessment: 38 y.o. male with PE for heparin  Goal of Therapy:  Heparin level 0.3-0.7 units/ml Monitor platelets by anticoagulation protocol: Yes   Plan:  Heparin 6000 units IV bolus, then start heparin 2100 units/hr Check heparin level in 6  hours.    Anni Hocevar, Bronson Curb 11/27/2018,3:58 AM

## 2018-11-27 NOTE — Progress Notes (Signed)
Pt refuses wheelchair and ambulated off the unit with wife, witness NS Vickie. Pt has all belongings and educated on [facility]

## 2018-11-27 NOTE — Progress Notes (Signed)
VASCULAR LAB PRELIMINARY  PRELIMINARY  PRELIMINARY  PRELIMINARY  Bilateral lower extremity venous duplex completed.    Preliminary report:  See CV Proc  Nastasha Reising, RVT 11/27/2018, 9:00 AM

## 2018-11-27 NOTE — Progress Notes (Signed)
Pharmacist, Kenney Houseman, stated to give warfarin prior to discharge and educate pt on taking dose at same time each day  Pt aware

## 2018-11-27 NOTE — Progress Notes (Signed)
[place] for warfarin + lovenox Indication: pulmonary embolus  Allergies  Allergen Reactions  . Gabapentin Other (See Comments)    Crying spells  . Lyrica [Pregabalin] Other (See Comments)    Makes the patient somnolent    Patient Measurements:   Heparin Dosing Weight: 120 kg  Vital Signs: Temp: 97.7 F (36.5 C) (02/29 0520) Temp Source: Oral (02/29 0520) BP: 140/87 (02/29 0750) Pulse Rate: 81 (02/29 0750)  Labs: Recent Labs    11/26/18 2238 11/27/18 0453 11/27/18 0929  HGB 15.2  --   --   HCT 47.9  --   --   PLT 124*  --   --   HEPARINUNFRC  --   --  0.30  CREATININE 0.76  --   --   TROPONINI  --  <0.03  --     Estimated Creatinine Clearance: 198.3 mL/min (by C-G formula based on SCr of 0.76 mg/dL).   Medical History: Past Medical History:  Diagnosis Date  . ADHD   . ADHD (attention deficit hyperactivity disorder)   . Bipolar 1 disorder ([place])   . Bipolar disorder ([place])   . Diabetes mellitus without complication ([place])   . Morbid obesity ([place])   . Obesity   . Panic attack   . Varicose veins of both lower extremities with pain     Medications:  No current facility-administered medications on file prior to encounter.    Current Outpatient Medications on File Prior to Encounter  Medication Sig Dispense Refill  . albuterol (PROVENTIL HFA;VENTOLIN HFA) 108 (90 Base) MCG/ACT inhaler Inhale 1-2 puffs into the lungs every 6 (six) hours as needed for wheezing or shortness of breath. 1 Inhaler 0  . glipiZIDE (GLUCOTROL) 10 MG tablet Take 1 tablet (10 mg total) by mouth 2 (two) times daily before a meal. (Patient taking differently: Take 10 mg by mouth daily before breakfast. ) 60 tablet 3  . ibuprofen (ADVIL,MOTRIN) 200 MG tablet Take 800 mg by mouth every 8 (eight) hours as needed for moderate pain.    . Insulin Glargine (BASAGLAR KWIKPEN) 100 UNIT/ML SOPN Inject 0.1 mLs (10 Units total) into the skin at bedtime. 3 mL 2  .  Menthol (ICY HOT ADVANCED RELIEF) 7.5 % PTCH Apply 1 patch topically as needed (pain).    . metFORMIN (GLUCOPHAGE-XR) 500 MG 24 hr tablet TAKE 2 TABLETS WITH BREAKFAST (Patient taking differently: Take 1,000 mg by mouth at bedtime. ) 60 tablet 3  . naproxen sodium (ANAPROX) 220 MG tablet Take 440 mg by mouth 2 (two) times daily as needed (for pain).     . traMADol (ULTRAM) 50 MG tablet Take 1 tablet (50 mg total) by mouth every 12 (twelve) hours as needed. (Patient taking differently: Take 50 mg by mouth every 12 (twelve) hours as needed (for pain). ) 60 tablet 2  . brompheniramine-pseudoephedrine-DM 30-2-10 MG/5ML syrup Take 5 mLs by mouth 4 (four) times daily as needed. (Patient not taking: Reported on 11/27/2018) 120 mL 0  . glucose monitoring kit (FREESTYLE) monitoring kit 1 each by Does not apply route 4 (four) times daily - after meals and at bedtime. 1 month Diabetic Testing Supplies for QAC-QHS accuchecks. 1 each 1  . Insulin Pen Needle (TRUEPLUS PEN NEEDLES) 32G X 4 MM MISC Use as directed to administer victoza 100 each 3  . ipratropium-albuterol (DUONEB) 0.5-2.5 (3) MG/3ML SOLN Take 3 mLs by nebulization every 4 (four) hours as needed. (Patient not taking: Reported on 11/27/2018)  360 mL 0  . metoCLOPramide (REGLAN) 5 MG tablet Take 1 tablet (5 mg total) by mouth 3 (three) times daily before meals. (Patient not taking: Reported on 11/27/2018) 90 tablet 0  . [DISCONTINUED] naproxen (NAPROSYN) 500 MG tablet Take 1 tablet (500 mg total) by mouth 2 (two) times daily. (Patient not taking: Reported on 11/27/2018) 30 tablet 0     Assessment: 38 y.o. male with PE. Was started on heparin, now consulted to start warfarin with lovenox bridge. Pt not good candidate for DOAC d/t weight.   CBC ok, last HL therapeutic at 0.3, plts 124, SCR 0.76  Goal of Therapy:  Heparin level 0.3-0.7 units/ml Monitor platelets by anticoagulation protocol: Yes   Plan:  DC heparin Start warfarin 76mx1 today Will  transition to 578mdaily starting tomorrow if DC today If remains inpt will dose off INR Start lovenox 15095mQ BID   TanJuanell FairlyharmD PGY1 Pharmacy Resident Phone ([phone]/2020 11:27 AM

## 2018-11-27 NOTE — Discharge Instructions (Signed)

## 2018-11-27 NOTE — Care Management Note (Signed)
Case Management Note  Patient Details  Name: Bradley [name] MRN: [id number] Date of Birth: 04-15-1981  Subjective/Objective:  From home , has PE,  Per MD he will not be going on xarelto or eliquis due to his weight, so when he is ready to be discharged he will go on coumadin and lovenox.  This NCM informed MD when he finds out the dosage for the lovenox to send to patient's pharmacy so that we can find out how much it will be for the lovenox, he states patient may be here til Monday, if so when can do a benefit check then for the lovenox.  Patient will also need education from RN for lovenox injections.                  Action/Plan: NCM will follow for transition of care needs.   Expected Discharge Date:  11/28/18               Expected Discharge Plan:  Home/Self Care  In-House Referral:     Discharge planning Services  CM Consult, Medication Assistance  Post Acute Care Choice:    Choice offered to:     DME Arranged:    DME Agency:     HH Arranged:    HH Agency:     Status of Service:  In process, will continue to follow  If discussed at Long Length of Stay Meetings, dates discussed:    Additional Comments:  Leone Haven, RN 11/27/2018, 11:08 AM

## 2018-11-27 NOTE — Progress Notes (Signed)
Pt IVs discontinued, catheters intact and telemetry removed by NT. Pharmacist, Kenney Houseman, came to bedside to educate pt on new mediations and bridging process. RN educated on how to give lovenox shot. Per pharmacist, Kenney Houseman, pt is to take warfarin at same time daily. RN educated pt last dose of warfarin was received at 3 pm therefore he should take it at 3 pm tomorrow. [facility]. Discharge education provided at bedside. Pt has all belongings. Awaiting transportation.

## 2018-11-27 NOTE — Progress Notes (Addendum)
[phone]) regarding Lovenox cost.  [facility]ada informed  NCM pt's cost would be $1.30 for Lovenox 150 mg x 10 syringes and informed NCM prescription is ready for pt pick. NCM made hospital pharmacy aware. Gae Gallop RN,BSN,CM

## 2018-11-28 LAB — HEPATITIS PANEL, ACUTE
HCV Ab: <0.1 s/co ratio (ref 0.0–0.9)
HEP B S AG: NEGATIVE
Hep A IgM: NEGATIVE
Hep B C IgM: NEGATIVE

## 2018-12-02 ENCOUNTER — Ambulatory Visit: Payer: Medicare Other | Attending: Family Medicine | Admitting: Pharmacist

## 2018-12-02 DIAGNOSIS — Z7901 Long term (current) use of anticoagulants: Secondary | ICD-10-CM | POA: Diagnosis not present

## 2018-12-02 DIAGNOSIS — I2699 Other pulmonary embolism without acute cor pulmonale: Secondary | ICD-10-CM | POA: Diagnosis not present

## 2018-12-02 LAB — POCT INR: INR: 8 — AB (ref 2.0–3.0)

## 2018-12-02 NOTE — Progress Notes (Signed)
    Pharmacy Anticoagulation Clinic  Subjective: 38 YO white male with PMH significant for acute PE (11/26/18).   Patient presents today for INR monitoring. Anticoagulation indication is bilateral PE.   Current dose of warfarin: 10 mg daily (70 mg)  Adherence to warfarin: yes  Signs/symptoms of bleeding: denies Recent changes in diet: denies Recent changes in medications: denies  Upcoming procedures that may impact anticoagulation: none  Objective: Today's INR = >8.0  Assessment and Plan: Anticoagulation: patient is Supratherapeutic based on patient's INR of >8 and patient's INR goal of 2-3. Will hold warfarin. Will obtain INR from venous draw. As patient has taken his dose this morning, he will hold warfarin tomorrow, Saturday, and Sunday. He has been instructed not to resume warfarin until I see him on Monday to re-check his INR.   Case discussed with his PCP. No s/sx of bleeding so no vitamin K administered. Pt has been instructed to go to the ER if bleeding does occur.  Patient verbalized understanding and was provided with written instructions. Next INR check planned for Monday.   Butch Penny, PharmD, CPP Clinical Pharmacist [facility] [phone]

## 2018-12-03 ENCOUNTER — Telehealth: Payer: Self-pay

## 2018-12-03 LAB — PROTIME-INR

## 2018-12-03 NOTE — Telephone Encounter (Signed)
-----   Message from Hoy Register, MD sent at 11/22/2018 12:00 PM EST ----- Cholesterol is normal, glucose is elevated.  Please advised to comply with new regimen which was initiated at his last visit.

## 2018-12-03 NOTE — Telephone Encounter (Signed)
Patient was called and informed of lab results. 

## 2018-12-06 ENCOUNTER — Ambulatory Visit: Payer: Medicare Other | Attending: Family Medicine | Admitting: Pharmacist

## 2018-12-06 DIAGNOSIS — I2699 Other pulmonary embolism without acute cor pulmonale: Secondary | ICD-10-CM | POA: Insufficient documentation

## 2018-12-06 LAB — POCT INR: INR: 2.9 (ref 2.0–3.0)

## 2018-12-06 MED ORDER — WARFARIN SODIUM 7.5 MG PO TABS: 7.5000 mg | ORAL_TABLET | Freq: Every day | ORAL | 0 refills | Status: DC

## 2018-12-06 MED FILL — WARFARIN SODIUM 7.5 MG TAB: 7.5 | 30 days supply | Qty: 30 | Fill #0

## 2018-12-07 NOTE — Progress Notes (Deleted)
Patient ID: BEDFORD MILLES, male   DOB: Jul 16, 1981, 38 y.o.   MRN: [id number]   After hospitalization 2/28-2/29/2020.  From discharge summary:  At admission:  Neomiah May[place] Dickersonis a 38 y.o.malewith medical history significant forobesity, bipolar disorder, insulin-dependent diabetes mellitus, and history of DVT in 2017 status post 1 year of Xarelto, now presenting to the emergency department with pleuritic pain in the upper left chest and cough with scant hemoptysis. Patient developed cough with blood-tinged sputum approximately 10 days ago, was having pleuritic pain in the upper left chest, completed a course of doxycycline for suspected bronchitis, but continues to have this pleuritic pain and hemoptysis. He denies any fevers or chills. Reports that he tolerated Xarelto without incident. Reports a family history of DVT in his mother. Denies any recent surgery, prolonged immobilization, or new medications. Reports chronic bilateral lower extremity swelling and discoloration.  ED Course:Upon arrival to the ED, patient is found to be afebrile, saturating well on room air, and with vitals otherwise normal. Chemistry panel is unremarkable and CBC notable for mild thrombocytopenia. D-dimer is elevated to 3.41 and CTA chest reveals bilateral pulmonary embolism without heart strain, and with patchy area of consolidation in the left upper lung possibly reflecting infarct or pneumonia, as well as nodular liver appearance and enlarged spleen. Patient was given 10 units of insulin, DuoNeb, Ultram, and started on IV heparin infusion. He remains hemodynamically stable and will be observed for further evaluation and management.     Hospital Course:   1.  Pulmonary embolism, history of DVT: Patient presented with complaints of left upper chest pain cough with hemoptysis.  CT angiogram revealed acute pulmonary embolus.  Patient has been started on a heparin drip.Prior history of DVT in 2017  treated with Xarelto for 1 year. Patient reported complaints of left thigh pain. Vascular Doppler ultrasound negative. Suspect symptoms could be secondary to hypercoagulable state given family history of mother having multiple DVTs as well along with sedentary lifestyle.  Plan was to switch to Xarelto, but new agents not studied in a patient of his weight.  Pharmacy recommending Coumadin with Lovenox bridging.  Patient requesting to be discharged home.  Prescriptions sent for Lovenox bridging along with Coumadin.  Pharmacy educated patient prior to discharge.  Ambulatory referral to community health and wellness for anticoagulation monitoring on Monday.  Stressed need to go to [employer] and wellness on Monday for INR check.  Counseled on need to refrain from NSAID use.  Patient would benefit from a hypercoagulable work-up and/or referral to hematology given family history and because this is the second blood clot although he also reports being sedentary.  2.  Insulin-dependent diabetes mellitus: Last hemoglobin A1c 10.1in 10/2018. Managed with Lantus 10 units nightly, metformin, and glipizide.  Patient was placed on a sliding scale insulin with Lantus while in the hospital.  He was seen to be drinking soda with bag of chips at bedside.  Discussed need of better compliance with diabetes.  3.  Thrombocytopenia: Platelets 124 on admission.  Could be related with incidental findings of nodular liver and splenomegaly.  4.  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 LFTs were noted to be within normal limits. Patient denies daily use of alcohol. Suspect the possibility of Elita Boone, but patient would need furthertesting.  Consider outpatient referral to gastroenterology for further work-up.

## 2018-12-08 ENCOUNTER — Inpatient Hospital Stay: Payer: Self-pay

## 2018-12-09 ENCOUNTER — Encounter: Payer: Self-pay | Admitting: Family Medicine

## 2018-12-09 ENCOUNTER — Other Ambulatory Visit: Payer: Self-pay

## 2018-12-09 ENCOUNTER — Ambulatory Visit: Payer: Medicare Other | Attending: Family Medicine | Admitting: Family Medicine

## 2018-12-09 VITALS — BP 121/83 | HR 87 | Temp 97.6°F | Ht 72.0 in | Wt 360.0 lb

## 2018-12-09 DIAGNOSIS — Z794 Long term (current) use of insulin: Secondary | ICD-10-CM

## 2018-12-09 DIAGNOSIS — Z6841 Body Mass Index (BMI) 40.0 and over, adult: Secondary | ICD-10-CM

## 2018-12-09 DIAGNOSIS — E1140 Type 2 diabetes mellitus with diabetic neuropathy, unspecified: Secondary | ICD-10-CM | POA: Diagnosis not present

## 2018-12-09 DIAGNOSIS — I2699 Other pulmonary embolism without acute cor pulmonale: Secondary | ICD-10-CM

## 2018-12-09 LAB — GLUCOSE, POCT (MANUAL RESULT ENTRY): POC Glucose: 215 mg/dl — AB (ref 70–99)

## 2018-12-09 LAB — POCT INR: INR: 7.6 — AB (ref 2.0–3.0)

## 2018-12-09 MED ORDER — WARFARIN SODIUM 5 MG PO TABS: 5.0000 mg | ORAL_TABLET | Freq: Every day | ORAL | 1 refills | Status: DC

## 2018-12-09 MED ORDER — DULOXETINE HCL 60 MG PO CPEP: 60.0000 mg | ORAL_CAPSULE | Freq: Every day | ORAL | 3 refills | Status: DC

## 2018-12-09 NOTE — Progress Notes (Addendum)
Subjective:  Patient ID: Bradley [name], male    DOB: 10/02/1980  Age: 38 y.o. MRN: [id number]  CC: Diabetes   HPI CHANC KERVIN is a 38 year old male with a history of morbid obesity, type 2 diabetes mellitus (A1c 10.1), diabetic neuropathy, Bipolar disorder, previous DVT in 2017 with hospitalization at [facility] from 11/26/18 to 11/27/2018. He had presented with pleuritic chest pain, blood-tinged sputum after completing a course of doxycycline.  He had elevated d-dimer.  CT angiogram of the chest revealed: IMPRESSION: 1. Positive examination for bilateral acute pulmonary emboli. No evidence of right heart strain. 2. Patchy areas of consolidation in the left upper lung may represent pneumonia or pulmonary infarcts. 3. Possible hepatic cirrhosis with splenic enlargement.  He was commenced on IV heparin which was subsequently transitioned to Coumadin and Lovenox and subsequently discharged. Seen by the clinical pharmacist with an initial INR of 8.0 while he was on 10 mg of Coumadin and Lovenox.  Coumadin was held for 3 days and Lovenox discontinued with subsequent INR of 2.93 days ago. His INR is 7.6 today and he denies bruising or bleeding, denies hematemesis, hematochezia.  He is aware of dietary modifications while on Coumadin. He does have shortness of breath he states but no chest pain, wheezing  He complains of bilateral leg pains from his diabetic neuropathy despite taking tramadol.  Tried gabapentin and Lyrica in the past which he was unable to tolerate due to side effects. His left shoulder has also hurt since last night and he is thinking this is because he slept wrong. Past Medical History:  Diagnosis Date  . ADHD   . ADHD (attention deficit hyperactivity disorder)   . Bipolar 1 disorder ([place])   . Bipolar disorder ([place])   . Diabetes mellitus without complication ([place])   . Morbid obesity ([place])   . Obesity   . Panic attack   . Varicose veins of both lower  extremities with pain     Past Surgical History:  Procedure Laterality Date  . surgery on meatus as a child      Family History  Problem Relation Age of Onset  . Rheum arthritis Mother   . Diabetes Mother   . Heart failure Mother   . Stroke Mother     Allergies  Allergen Reactions  . Gabapentin Other (See Comments)    Crying spells  . Lyrica [Pregabalin] Other (See Comments)    Makes the patient somnolent    Outpatient Medications Prior to Visit  Medication Sig Dispense Refill  . albuterol (PROVENTIL HFA;VENTOLIN HFA) 108 (90 Base) MCG/ACT inhaler Inhale 1-2 puffs into the lungs every 6 (six) hours as needed for wheezing or shortness of breath. 1 Inhaler 0  . brompheniramine-pseudoephedrine-DM 30-2-10 MG/5ML syrup Take 5 mLs by mouth 4 (four) times daily as needed. (Patient not taking: Reported on 11/27/2018) 120 mL 0  . enoxaparin (LOVENOX) 150 MG/ML injection Inject 1 mL (150 mg total) into the skin every 12 (twelve) hours. 10 Syringe 0  . glipiZIDE (GLUCOTROL) 10 MG tablet Take 1 tablet (10 mg total) by mouth 2 (two) times daily before a meal. (Patient taking differently: Take 10 mg by mouth daily before breakfast. ) 60 tablet 3  . glucose monitoring kit (FREESTYLE) monitoring kit 1 each by Does not apply route 4 (four) times daily - after meals and at bedtime. 1 month Diabetic Testing Supplies for QAC-QHS accuchecks. 1 each 1  . HYDROcodone-acetaminophen (NORCO/VICODIN) 5-325 MG tablet Take 1-2 tablets  by mouth every 4 (four) hours as needed for severe pain. 20 tablet 0  . Insulin Glargine (BASAGLAR KWIKPEN) 100 UNIT/ML SOPN Inject 0.1 mLs (10 Units total) into the skin at bedtime. 3 mL 2  . Insulin Pen Needle (TRUEPLUS PEN NEEDLES) 32G X 4 MM MISC Use as directed to administer victoza 100 each 3  . Menthol (ICY HOT ADVANCED RELIEF) 7.5 % PTCH Apply 1 patch topically as needed (pain).    . metFORMIN (GLUCOPHAGE-XR) 500 MG 24 hr tablet TAKE 2 TABLETS WITH BREAKFAST (Patient  taking differently: Take 1,000 mg by mouth at bedtime. ) 60 tablet 3  . traMADol (ULTRAM) 50 MG tablet Take 1 tablet (50 mg total) by mouth every 12 (twelve) hours as needed. (Patient taking differently: Take 50 mg by mouth every 12 (twelve) hours as needed (for pain). ) 60 tablet 2  . warfarin (COUMADIN) 7.5 MG tablet Take 1 tablet (7.5 mg total) by mouth daily. 30 tablet 0   No facility-administered medications prior to visit.      ROS Review of Systems  Constitutional: Negative for activity change and appetite change.  HENT: Negative for sinus pressure and sore throat.   Eyes: Negative for visual disturbance.  Respiratory: Positive for shortness of breath. Negative for cough and chest tightness.   Cardiovascular: Negative for chest pain and leg swelling.  Gastrointestinal: Negative for abdominal distention, abdominal pain, constipation and diarrhea.  Endocrine: Negative.   Genitourinary: Negative for dysuria.  Musculoskeletal: Negative for joint swelling and myalgias.       Left shoulder pain  Skin: Negative for rash.  Allergic/Immunologic: Negative.   Neurological: Positive for numbness. Negative for weakness and light-headedness.  Psychiatric/Behavioral: Negative for dysphoric mood and suicidal ideas.    Objective:  BP 121/83   Pulse 87   Temp 97.6 F (36.4 C) (Oral)   Ht 6' (1.829 m)   Wt (!) 360 lb (163.3 kg)   SpO2 100%   BMI 48.82 kg/m   BP/Weight 12/09/2018 11/27/2018 11/15/2018  Systolic BP 121 142 135  Diastolic BP 83 81 89  Wt. (Lbs) 360 - 360.8  BMI 48.82 - 48.93  Some encounter information is confidential and restricted. Go to Review Flowsheets activity to see all data.      Physical Exam Constitutional: Morbidly obese Eyes: PERRLA HEENT: Head is atraumatic, normal sinuses, normal oropharynx, normal appearing tonsils and palate, tympanic membrane is normal bilaterally. Neck: normal range of motion, no thyromegaly, no JVD Cardiovascular: normal rate and  rhythm, normal heart sounds, no murmurs, rub or gallop, no pedal edema Respiratory: Normal breath sounds, clear to auscultation bilaterally, no wheezes, no rales, no rhonchi Abdomen: soft, not tender to palpation, normal bowel sounds, no enlarged organs Musculoskeletal: Full ROM, slight tenderness on range of motion of left upper extremity Skin: warm and dry, no lesions. Neurological: alert, oriented x3, cranial nerves I-XII grossly intact , normal motor strength, hyperesthesia in lower extremities Psychological: normal mood.   CMP Latest Ref Rng & Units 11/26/2018 11/19/2018 05/17/2018  Glucose 70 - 99 mg/dL 183(H) 249(H) 131(H)  BUN 6 - 20 mg/dL <5(L) 8 9  Creatinine 0.61 - 1.24 mg/dL 0.76 0.70(L) 0.78  Sodium 135 - 145 mmol/L 138 139 141  Potassium 3.5 - 5.1 mmol/L 4.2 4.8 4.1  Chloride 98 - 111 mmol/L 103 101 101  CO2 22 - 32 mmol/L 28 23 21  Calcium 8.9 - 10.3 mg/dL 9.3 9.0 9.2  Total Protein 6.5 - 8.1 g/dL 6.8 6.9 6.6    Total Bilirubin 0.3 - 1.2 mg/dL 0.9 1.1 0.6  Alkaline Phos 38 - 126 U/L 65 74 64  AST 15 - 41 U/L _0 ALT 0 - 44 U/L 27 40 22    Lipid Panel     Component Value Date/Time   CHOL 132 11/19/2018 1441   TRIG 115 11/19/2018 1441   HDL 37 (L) 11/19/2018 1441   CHOLHDL 3.6 11/19/2018 1441   CHOLHDL 4.0 11/12/2016 1613   VLDL 28 11/12/2016 1613   LDLCALC 72 11/19/2018 1441    CBC    Component Value Date/Time   WBC 6.0 11/27/2018 1057   RBC 4.90 11/27/2018 1057   HGB 13.7 11/27/2018 1057   HGB 16.5 05/20/2017 1549   HCT 40.9 11/27/2018 1057   HCT 48.5 05/20/2017 1549   PLT 99 (L) 11/27/2018 1057   PLT 140 (L) 05/20/2017 1549   MCV 83.5 11/27/2018 1057   MCV 85 05/20/2017 1549   MCH 28.0 11/27/2018 1057   MCHC 33.5 11/27/2018 1057   RDW 13.2 11/27/2018 1057   RDW 14.6 05/20/2017 1549   LYMPHSABS 1.8 11/26/2018 2238   LYMPHSABS 2.5 05/20/2017 1549   MONOABS 0.5 11/26/2018 2238   EOSABS 0.3 11/26/2018 2238   EOSABS 0.2 05/20/2017 1549   BASOSABS  0.0 11/26/2018 2238   BASOSABS 0.0 05/20/2017 1549    Lab Results  Component Value Date   HGBA1C 10.1 (A) 11/15/2018   Lab Results  Component Value Date   INR 7.6 (A) 12/09/2018   INR 2.9 12/06/2018   INR CANCELED 12/02/2018    Assessment & Plan:   1. Acute pulmonary embolism without acute cor pulmonale, unspecified pulmonary embolism type (HCC) INR is supratherapeutic at 7.6 today ; was 2.9 three days ago. No hematemesis, hematuria.  Advised to report if any of the symptoms are noticed. Hold Coumadin x3 days-hold Tuesday, Thursday, Friday, Saturday then resume 5 mg on Sunday and Monday and see clinical pharmacist on Tuesday for INR check He does have intermittent dyspnea but oxygen saturation remains normal at 100%; no red flags and no indication for referral for hospitalization He has been reassured. Will remain on lifelong anticoagulation due to significantly family history of thrombotic events - INR  2. Type 2 diabetes mellitus with diabetic neuropathy, with long-term current use of insulin (HCC) Uncontrolled on tramadol Unable to tolerate Lyrica and gabapentin due to allergic reaction Placed on Cymbalta - POCT glucose (manual entry)  Questionable cirrhosis on CT of the abdomen but right upper quadrant ultrasound from earlier this year was negative for this.  At next visit will consider repeat abdominal imaging or referral to GI if indicated.  Meds ordered this encounter  Medications  . warfarin (COUMADIN) 5 MG tablet    Sig: Take 1 tablet (5 mg total) by mouth daily. Adjust daily dose per instruction from the Coumadin clinic    Dispense:  30 tablet    Refill:  1  . DULoxetine (CYMBALTA) 60 MG capsule    Sig: Take 1 capsule (60 mg total) by mouth daily.    Dispense:  30 capsule    Refill:  3    Follow-up: Return for Follow-up of chronic medical conditions, keep previously scheduled appointment with PCP; keep appoin.       Charlott Rakes, MD, FAAFP. [facility] and [facility], [place]   12/09/2018, 12:49 PM

## 2018-12-10 MED FILL — DULoxetine HCL 60 MG CPEP: 60 | 90 days supply | Qty: 90 | Fill #0

## 2018-12-10 MED FILL — METFORMIN HCL ER 500 MG TAB: 500 | 30 days supply | Qty: 60 | Fill #3

## 2018-12-10 MED FILL — WARFARIN SODIUM 5 MG TABLET: 5 | 30 days supply | Qty: 30 | Fill #0

## 2018-12-13 MED FILL — BASAGLAR 100 UNIT/ML KWIKPE: 100 | 30 days supply | Qty: 3 | Fill #1

## 2018-12-13 MED FILL — traMADol HCL 50 MG TABS: 50 | 30 days supply | Qty: 60 | Fill #1

## 2018-12-14 ENCOUNTER — Encounter: Payer: Self-pay | Admitting: Pharmacist

## 2018-12-14 ENCOUNTER — Other Ambulatory Visit: Payer: Self-pay

## 2018-12-14 ENCOUNTER — Ambulatory Visit: Payer: Medicare Other | Attending: Family Medicine | Admitting: Pharmacist

## 2018-12-14 DIAGNOSIS — E1140 Type 2 diabetes mellitus with diabetic neuropathy, unspecified: Secondary | ICD-10-CM | POA: Diagnosis not present

## 2018-12-14 DIAGNOSIS — I2699 Other pulmonary embolism without acute cor pulmonale: Secondary | ICD-10-CM | POA: Insufficient documentation

## 2018-12-14 DIAGNOSIS — F17210 Nicotine dependence, cigarettes, uncomplicated: Secondary | ICD-10-CM | POA: Insufficient documentation

## 2018-12-14 DIAGNOSIS — Z794 Long term (current) use of insulin: Secondary | ICD-10-CM | POA: Diagnosis not present

## 2018-12-14 DIAGNOSIS — R0602 Shortness of breath: Secondary | ICD-10-CM | POA: Insufficient documentation

## 2018-12-14 LAB — GLUCOSE, POCT (MANUAL RESULT ENTRY): POC Glucose: 219 mg/dl — AB (ref 70–99)

## 2018-12-14 LAB — POCT INR: INR: 4.5 — AB (ref 2.0–3.0)

## 2018-12-14 NOTE — Progress Notes (Signed)
    S:    PCP: [place]  No chief complaint on file.  Patient arrives in good spirits. Presents for diabetes management at the request of Dr. Alvis Lemmings. Patient was referred on 11/15/18. Since then, I have been managing his Coumadin as well. Of note, he was last seen in clinic 12/09/18 w/ INR supratherapeutic at 7.6.   He denies bruising or bleeding, denies hematemesis, hematochezia. He is aware of dietary modifications while on Coumadin. He does have shortness of breath but notes improvement. No chest pain.   Family/Social History:  - FHx: DM, stroke, and HF (mother) - Current 1 PPD smoker  - Rarely drinks alcohol  Insurance coverage/medication affordability:  - Medicare A & B - No rx drug coverage   Patient reports adherence with medications.  Current DM medications:  - Metformin 500 mg XR; 2 tablets daily  Patient denies hypoglycemic events since starting Basaglar.   Patient reported dietary habits - Does not adhere to a diabetic diet - Reports intake of regular soda; diet only when he can afford it   Patient-reported exercise habits:  - None   Patient denies polyuria. Reports polydipsia.   Patient reports occasional numbness in fingers.. Patient denies visual changes. Patient reports self foot exams.   O:   POCT INR: 4.5 POCT glucose: 219. Consumed regular soda ~30 minutes before seeing me.   Lab Results  Component Value Date   HGBA1C 10.1 (A) 11/15/2018   There were no vitals filed for this visit.  Lipid Panel     Component Value Date/Time   CHOL 132 11/19/2018 1441   TRIG 115 11/19/2018 1441   HDL 37 (L) 11/19/2018 1441   CHOLHDL 3.6 11/19/2018 1441   CHOLHDL 4.0 11/12/2016 1613   VLDL 28 11/12/2016 1613   LDLCALC 72 11/19/2018 1441   A/P: Diabetes longstanding currently uncontrolled. Patient is able to verbalize appropriate hypoglycemia management plan. Patient is adherent with medication. Control is suboptimal due to dietary indiscretion and sedentary  lifestyle. Also, he is not checking BG at home - reports he must wait until he is paid. Will not titrate insulin until he is checking his BG daily at home.    -Continued metformin and Basaglar at current doses -Encouraged him to obtain meter and start checking home CBGs once he has the means to do so -Extensively discussed pathophysiology of DM, recommended lifestyle interventions, dietary effects on glycemic control -Counseled on s/sx of and management of hypoglycemia -Next A1C anticipated 01/2019.   Anticoagulation: Indication - acute PE. Pt is supratherapeutic with INR of 4.5 and goal of 2-3. Will have patient hold today's and tomorrow's dose. He is to return Thursday for re-evaluation of INR. Recommend to resume at decreased weekly dose. - POCT INR - Hold warfarin until re-eval Thursday  Written patient instructions provided.  Total time in face to face counseling 15 minutes.   Follow up Pharmacist clinic Visit in 2 days.     Butch Penny, PharmD, CPP Clinical Pharmacist [facility] & [facility] [phone]

## 2018-12-14 NOTE — Patient Instructions (Signed)
Thank you for coming to see me today. Please do the following:  1. No warfarin today or tomorrow.  2. Continue metformin and Basaglar at the same doses.  3. Continue making the lifestyle changes we've discussed together during our visit. Diet and exercise play a significant role in improving your blood sugars.  4. Follow-up with me in 2 days.

## 2018-12-16 ENCOUNTER — Ambulatory Visit: Payer: Medicare Other | Attending: Family Medicine | Admitting: Pharmacist

## 2018-12-16 ENCOUNTER — Other Ambulatory Visit: Payer: Self-pay

## 2018-12-16 DIAGNOSIS — I2699 Other pulmonary embolism without acute cor pulmonale: Secondary | ICD-10-CM

## 2018-12-16 DIAGNOSIS — Z7901 Long term (current) use of anticoagulants: Secondary | ICD-10-CM | POA: Diagnosis not present

## 2018-12-16 LAB — POCT INR: INR: 2.8 (ref 2.0–3.0)

## 2018-12-16 MED ORDER — WARFARIN SODIUM 2.5 MG PO TABS: ORAL_TABLET | ORAL | 2 refills | Status: DC

## 2018-12-16 NOTE — Addendum Note (Signed)
Addended by: Lois Huxley, Jeannett Senior L on: 12/16/2018 02:51 PM   Modules accepted: Orders

## 2018-12-21 ENCOUNTER — Other Ambulatory Visit: Payer: Self-pay

## 2018-12-21 ENCOUNTER — Ambulatory Visit: Payer: Medicare Other | Attending: Family Medicine | Admitting: Pharmacist

## 2018-12-21 DIAGNOSIS — I2699 Other pulmonary embolism without acute cor pulmonale: Secondary | ICD-10-CM

## 2018-12-21 LAB — POCT INR: INR: 4.6 — AB (ref 2.0–3.0)

## 2018-12-21 MED FILL — glipiZIDE 10 MG TABS: 10 | 30 days supply | Qty: 60 | Fill #0

## 2018-12-21 MED FILL — WARFARIN SODIUM 2.5 MG TAB: 2.5 | 30 days supply | Qty: 30 | Fill #0

## 2018-12-23 ENCOUNTER — Ambulatory Visit: Payer: Medicare Other | Attending: Family Medicine | Admitting: Pharmacist

## 2018-12-23 ENCOUNTER — Other Ambulatory Visit: Payer: Self-pay

## 2018-12-23 DIAGNOSIS — I2699 Other pulmonary embolism without acute cor pulmonale: Secondary | ICD-10-CM | POA: Insufficient documentation

## 2018-12-23 LAB — POCT INR: INR: 2.2 (ref 2.0–3.0)

## 2018-12-28 ENCOUNTER — Ambulatory Visit: Payer: Medicare Other | Attending: Family Medicine | Admitting: Pharmacist

## 2018-12-28 DIAGNOSIS — Z794 Long term (current) use of insulin: Secondary | ICD-10-CM | POA: Diagnosis not present

## 2018-12-28 DIAGNOSIS — I2699 Other pulmonary embolism without acute cor pulmonale: Secondary | ICD-10-CM

## 2018-12-28 DIAGNOSIS — E1140 Type 2 diabetes mellitus with diabetic neuropathy, unspecified: Secondary | ICD-10-CM | POA: Insufficient documentation

## 2018-12-28 LAB — POCT INR: INR: 2.1 (ref 2.0–3.0)

## 2019-01-05 ENCOUNTER — Other Ambulatory Visit: Payer: Self-pay

## 2019-01-05 ENCOUNTER — Ambulatory Visit: Payer: Medicare Other | Attending: Family Medicine | Admitting: Pharmacist

## 2019-01-05 DIAGNOSIS — I2699 Other pulmonary embolism without acute cor pulmonale: Secondary | ICD-10-CM | POA: Insufficient documentation

## 2019-01-05 LAB — POCT INR: INR: 1.7 — AB (ref 2.0–3.0)

## 2019-01-10 MED FILL — METFORMIN HCL ER 500 MG TAB: 500 | 30 days supply | Qty: 60 | Fill #0

## 2019-01-10 MED FILL — traMADol HCL 50 MG TABS: 50 | 30 days supply | Qty: 60 | Fill #2

## 2019-01-10 MED FILL — BASAGLAR 100 UNIT/ML KWIKPE: 100 | 30 days supply | Qty: 3 | Fill #2

## 2019-01-13 ENCOUNTER — Other Ambulatory Visit: Payer: Self-pay

## 2019-01-13 ENCOUNTER — Ambulatory Visit: Payer: Medicare Other | Attending: Family Medicine | Admitting: Pharmacist

## 2019-01-13 DIAGNOSIS — I2699 Other pulmonary embolism without acute cor pulmonale: Secondary | ICD-10-CM | POA: Diagnosis not present

## 2019-01-13 LAB — POCT INR: INR: 2.6 (ref 2.0–3.0)

## 2019-01-14 MED FILL — WARFARIN SODIUM 2.5 MG TAB: 2.5 | 30 days supply | Qty: 30 | Fill #1

## 2019-01-27 ENCOUNTER — Ambulatory Visit: Payer: Medicare Other | Attending: Family Medicine | Admitting: Pharmacist

## 2019-01-27 ENCOUNTER — Other Ambulatory Visit: Payer: Self-pay

## 2019-01-27 DIAGNOSIS — I2699 Other pulmonary embolism without acute cor pulmonale: Secondary | ICD-10-CM

## 2019-01-27 LAB — POCT INR: INR: 2.5 (ref 2.0–3.0)

## 2019-02-02 ENCOUNTER — Encounter: Payer: Self-pay | Admitting: Family Medicine

## 2019-02-02 ENCOUNTER — Other Ambulatory Visit: Payer: Self-pay

## 2019-02-02 ENCOUNTER — Ambulatory Visit: Payer: Medicare Other | Attending: Family Medicine | Admitting: Family Medicine

## 2019-02-02 DIAGNOSIS — I2699 Other pulmonary embolism without acute cor pulmonale: Secondary | ICD-10-CM

## 2019-02-02 DIAGNOSIS — E1149 Type 2 diabetes mellitus with other diabetic neurological complication: Secondary | ICD-10-CM | POA: Diagnosis not present

## 2019-02-02 DIAGNOSIS — Z794 Long term (current) use of insulin: Secondary | ICD-10-CM

## 2019-02-02 DIAGNOSIS — E1169 Type 2 diabetes mellitus with other specified complication: Secondary | ICD-10-CM

## 2019-02-02 MED ORDER — TRAMADOL HCL 50 MG PO TABS: 50.0000 mg | ORAL_TABLET | Freq: Two times a day (BID) | ORAL | 2 refills | Status: DC | PRN

## 2019-02-02 MED ORDER — GLIPIZIDE 10 MG PO TABS: 10.0000 mg | ORAL_TABLET | Freq: Two times a day (BID) | ORAL | 3 refills | Status: DC

## 2019-02-02 MED ORDER — DULOXETINE HCL 60 MG PO CPEP: 60.0000 mg | ORAL_CAPSULE | Freq: Every day | ORAL | 3 refills | Status: DC

## 2019-02-02 MED ORDER — ALBUTEROL SULFATE HFA 108 (90 BASE) MCG/ACT IN AERS: 1.0000 | INHALATION_SPRAY | Freq: Four times a day (QID) | RESPIRATORY_TRACT | 1 refills | Status: DC | PRN

## 2019-02-02 MED ORDER — BASAGLAR KWIKPEN 100 UNIT/ML ~~LOC~~ SOPN: 15.0000 [IU] | PEN_INJECTOR | Freq: Every day | SUBCUTANEOUS | 2 refills | Status: DC

## 2019-02-02 MED ORDER — METFORMIN HCL ER 500 MG PO TB24: ORAL_TABLET | ORAL | 3 refills | Status: DC

## 2019-02-02 MED FILL — VENTOLIN HFA 90 MCG INHALER: 108 (90 BAS | 25 days supply | Qty: 18 | Fill #0

## 2019-02-02 MED FILL — BASAGLAR 100 UNIT/ML KWIKPE: 100 | 20 days supply | Qty: 3 | Fill #0

## 2019-02-02 MED FILL — glipiZIDE 10 MG TABS: 10 | 90 days supply | Qty: 180 | Fill #0

## 2019-02-02 NOTE — Progress Notes (Signed)
Patient has been called and DOB has been verified. Patient has been screened and transferred to PCP to start phone visit.     

## 2019-02-02 NOTE — Progress Notes (Signed)
Virtual Visit via Telephone Note  I connected with Bradley [name], on 02/02/2019 at 3:21 PM by telephone due to the COVID-19 pandemic and verified that I am speaking with the correct person using two identifiers.   Consent: I discussed the limitations, risks, security and privacy concerns of performing an evaluation and management service by telephone and the availability of in person appointments. I also discussed with the patient that there may be a patient responsible charge related to this service. The patient expressed understanding and agreed to proceed.   Location of Patient: Home  Location of Provider: Clinic   Persons participating in Telemedicine visit: Nicolaos Mitrano - CMA Dr Margarita Rana - PCP     History of Present Illness: Bradley [name] is a 38 year old male with a history of morbid obesity, type 2 diabetes mellitus (A1c 10.1 ), diabetic neuropathy, Bipolar disorder, previous DVT in 2017, PE (diagnosed in 10/2018 - on chronic anticoagulation with Coumadin). His last INR was 2.5, one week ago and he has not had bruising or bleeding except for bleeding from a boil which he popped. He does have dyspnea and is still needing to use his MDI and requests a refill.   Cymbalta was added for better management of Diabetes Mellitus and he reports some improvement when he combines it with Tramadol. With regards to his Diabetes his fasting sugars ranged between 200-238 and he states has been eating right and walking around his apartment complex. He reduced Glipizide to '10mg'$  daily from bid dosing due to bloating and Diarrhea which he attributes to Glipizide. He is doing well on Lantus and his other medications.   Past Medical History:  Diagnosis Date  . ADHD   . ADHD (attention deficit hyperactivity disorder)   . Bipolar 1 disorder ([place])   . Bipolar disorder ([place])   . Diabetes mellitus without complication ([place])   . Morbid obesity ([place])   . Obesity   . Panic  attack   . Varicose veins of both lower extremities with pain    Allergies  Allergen Reactions  . Gabapentin Other (See Comments)    Crying spells  . Lyrica [Pregabalin] Other (See Comments)    Makes the patient somnolent    Current Outpatient Medications on File Prior to Visit  Medication Sig Dispense Refill  . albuterol (PROVENTIL HFA;VENTOLIN HFA) 108 (90 Base) MCG/ACT inhaler Inhale 1-2 puffs into the lungs every 6 (six) hours as needed for wheezing or shortness of breath. 1 Inhaler 0  . DULoxetine (CYMBALTA) 60 MG capsule Take 1 capsule (60 mg total) by mouth daily. 30 capsule 3  . glipiZIDE (GLUCOTROL) 10 MG tablet Take 1 tablet (10 mg total) by mouth 2 (two) times daily before a meal. (Patient taking differently: Take 10 mg by mouth daily before breakfast. ) 60 tablet 3  . glucose monitoring kit (FREESTYLE) monitoring kit 1 each by Does not apply route 4 (four) times daily - after meals and at bedtime. 1 month Diabetic Testing Supplies for QAC-QHS accuchecks. 1 each 1  . Insulin Glargine (BASAGLAR KWIKPEN) 100 UNIT/ML SOPN Inject 0.1 mLs (10 Units total) into the skin at bedtime. 3 mL 2  . Insulin Pen Needle (TRUEPLUS PEN NEEDLES) 32G X 4 MM MISC Use as directed to administer victoza 100 each 3  . Menthol (ICY HOT ADVANCED RELIEF) 7.5 % PTCH Apply 1 patch topically as needed (pain).    . metFORMIN (GLUCOPHAGE-XR) 500 MG 24 hr tablet TAKE 2 TABLETS WITH BREAKFAST (  Patient taking differently: Take 1,000 mg by mouth at bedtime. ) 60 tablet 3  . traMADol (ULTRAM) 50 MG tablet Take 1 tablet (50 mg total) by mouth every 12 (twelve) hours as needed. (Patient taking differently: Take 50 mg by mouth every 12 (twelve) hours as needed (for pain). ) 60 tablet 2  . warfarin (COUMADIN) 2.5 MG tablet Take as directed by the Coumadin Clinic. 30 tablet 2  . warfarin (COUMADIN) 5 MG tablet Take 1 tablet (5 mg total) by mouth daily. Adjust daily dose per instruction from the Coumadin clinic 30 tablet 1   . brompheniramine-pseudoephedrine-DM 30-2-10 MG/5ML syrup Take 5 mLs by mouth 4 (four) times daily as needed. (Patient not taking: Reported on 11/27/2018) 120 mL 0  . enoxaparin (LOVENOX) 150 MG/ML injection Inject 1 mL (150 mg total) into the skin every 12 (twelve) hours. (Patient not taking: Reported on 02/02/2019) 10 Syringe 0  . HYDROcodone-acetaminophen (NORCO/VICODIN) 5-325 MG tablet Take 1-2 tablets by mouth every 4 (four) hours as needed for severe pain. (Patient not taking: Reported on 02/02/2019) 20 tablet 0   No current facility-administered medications on file prior to visit.     Observations/Objective: Alert, awake, oriented x3 Not in acute distress  CMP Latest Ref Rng & Units 11/26/2018 11/19/2018 05/17/2018  Glucose 70 - 99 mg/dL 183(H) 249(H) 131(H)  BUN 6 - 20 mg/dL <5(L) 8 9  Creatinine 0.61 - 1.24 mg/dL 0.76 0.70(L) 0.78  Sodium 135 - 145 mmol/L 138 139 141  Potassium 3.5 - 5.1 mmol/L 4.2 4.8 4.1  Chloride 98 - 111 mmol/L 103 101 101  CO2 22 - 32 mmol/L '28 23 21  '$ Calcium 8.9 - 10.3 mg/dL 9.3 9.0 9.2  Total Protein 6.5 - 8.1 g/dL 6.8 6.9 6.6  Total Bilirubin 0.3 - 1.2 mg/dL 0.9 1.1 0.6  Alkaline Phos 38 - 126 U/L 65 74 64  AST 15 - 41 U/L '21 23 20  '$ ALT 0 - 44 U/L 27 40 22    Lipid Panel     Component Value Date/Time   CHOL 132 11/19/2018 1441   TRIG 115 11/19/2018 1441   HDL 37 (L) 11/19/2018 1441   CHOLHDL 3.6 11/19/2018 1441   CHOLHDL 4.0 11/12/2016 1613   VLDL 28 11/12/2016 1613   LDLCALC 72 11/19/2018 1441   Lab Results  Component Value Date   HGBA1C 10.1 (A) 11/15/2018    Assessment and Plan: 1. Type 2 diabetes mellitus with other specified complication, with long-term current use of insulin (HCC) Uncontrolled with A1c of 10.1 Increase Lantus to 15 is at bedtime and advised to increase glipizide to 10 mg twice daily He had complained of GI symptoms which he attributed to glipizide but I have reduced his metformin dose from 1000 mg to 500 mg given GI  symptoms If GI symptoms persist we will treat for gastroparesis Counseled on Diabetic diet, my plate method, 300 minutes of moderate intensity exercise/week Keep blood sugar logs with fasting goals of 80-120 mg/dl, random of less than 180 and in the event of sugars less than 60 mg/dl or greater than 400 mg/dl please notify the clinic ASAP. It is recommended that you undergo annual eye exams and annual foot exams. Pneumonia vaccine is recommended. - Insulin Glargine (BASAGLAR KWIKPEN) 100 UNIT/ML SOPN; Inject 0.15 mLs (15 Units total) into the skin at bedtime.  Dispense: 3 mL; Refill: 2 - glipiZIDE (GLUCOTROL) 10 MG tablet; Take 1 tablet (10 mg total) by mouth 2 (two) times daily before a  meal.  Dispense: 60 tablet; Refill: 3 - metFORMIN (GLUCOPHAGE-XR) 500 MG 24 hr tablet; TAKE 2 TABLETS WITH BREAKFAST  Dispense: 60 tablet; Refill: 3 - Hemoglobin A1c; Future  2. Other diabetic neurological complication associated with type 2 diabetes mellitus (HCC) Stable on tramadol and Cymbalta Previously unable to tolerate gabapentin or Lyrica - DULoxetine (CYMBALTA) 60 MG capsule; Take 1 capsule (60 mg total) by mouth daily.  Dispense: 30 capsule; Refill: 3 - traMADol (ULTRAM) 50 MG tablet; Take 1 tablet (50 mg total) by mouth every 12 (twelve) hours as needed (for pain).  Dispense: 60 tablet; Refill: 2  3. Acute pulmonary embolism without acute cor pulmonale, unspecified pulmonary embolism type (HCC) Currently on lifelong anticoagulation with Coumadin He does have some dyspnea - weight loss will be beneficial; refilled MDI due to dyspnea Last INR was 2.5 one week ago - albuterol (VENTOLIN HFA) 108 (90 Base) MCG/ACT inhaler; Inhale 1-2 puffs into the lungs every 6 (six) hours as needed for wheezing or shortness of breath.  Dispense: 1 Inhaler; Refill: 1  Consider repeat CT abdomen to follow up on questionable finding of Cirrhosis on CTA chest from 10/2018.  Follow Up Instructions: Return in about 3  months (around 05/05/2019).   I discussed the assessment and treatment plan with the patient. The patient was provided an opportunity to ask questions and all were answered. The patient agreed with the plan and demonstrated an understanding of the instructions.   The patient was advised to call back or seek an in-person evaluation if the symptoms worsen or if the condition fails to improve as anticipated.     I provided 16 minutes total of non-face-to-face time during this encounter including median intraservice time, reviewing previous notes, labs, imaging, medications and explaining diagnosis and management.     Charlott Rakes, MD, FAAFP. [facility] and [place], [place]   02/02/2019, 3:21 PM

## 2019-02-07 MED FILL — traMADol HCL 50 MG TABS: 50 | 30 days supply | Qty: 60 | Fill #0

## 2019-02-15 MED FILL — METFORMIN HCL ER 500 MG TAB: 500 | 30 days supply | Qty: 60 | Fill #1

## 2019-02-15 MED FILL — WARFARIN SODIUM 2.5 MG TAB: 2.5 | 30 days supply | Qty: 30 | Fill #2

## 2019-02-24 ENCOUNTER — Other Ambulatory Visit: Payer: Self-pay | Admitting: Family Medicine

## 2019-02-24 ENCOUNTER — Other Ambulatory Visit: Payer: Self-pay

## 2019-02-24 ENCOUNTER — Ambulatory Visit: Payer: Medicare Other | Attending: Family Medicine | Admitting: Pharmacist

## 2019-02-24 ENCOUNTER — Telehealth: Payer: Self-pay | Admitting: Pharmacist

## 2019-02-24 DIAGNOSIS — I2699 Other pulmonary embolism without acute cor pulmonale: Secondary | ICD-10-CM

## 2019-02-24 DIAGNOSIS — E1169 Type 2 diabetes mellitus with other specified complication: Secondary | ICD-10-CM

## 2019-02-24 DIAGNOSIS — Z794 Long term (current) use of insulin: Secondary | ICD-10-CM | POA: Insufficient documentation

## 2019-02-24 LAB — POCT GLYCOSYLATED HEMOGLOBIN (HGB A1C): Hemoglobin A1C: 11.3 % — AB (ref 4.0–5.6)

## 2019-02-24 LAB — POCT INR: INR: 2.2 (ref 2.0–3.0)

## 2019-02-24 MED ORDER — GLUCOSE BLOOD VI STRP: ORAL_STRIP | 11 refills | Status: DC

## 2019-02-24 MED ORDER — ONETOUCH VERIO W/DEVICE KIT: PACK | 0 refills | Status: DC

## 2019-02-24 MED ORDER — ONETOUCH DELICA LANCETS 33G MISC: 11 refills | Status: DC

## 2019-02-24 MED ORDER — BASAGLAR KWIKPEN 100 UNIT/ML ~~LOC~~ SOPN: 20.0000 [IU] | PEN_INJECTOR | Freq: Every day | SUBCUTANEOUS | 3 refills | Status: DC

## 2019-02-24 MED FILL — BASAGLAR 100 UNIT/ML KWIKPE: 100 | 30 days supply | Qty: 6 | Fill #0

## 2019-02-24 NOTE — Telephone Encounter (Signed)
Contacted patient this afternoon in follow-up of his A1c results. Patient advised to increase insulin to 20 units per recommendation of Dr. Alvis Lemmings.   I did advocate for this earlier today when the patient was in office but he declined. After discussion, with emphasis placed on the fact that this was also his PCP's recommendation, pt was amenable and verbalized understanding.

## 2019-02-24 NOTE — Progress Notes (Signed)
Of note, A1c today up from 10.1 at 11.3. We resulted this per request of patient's PCP.   Patient reports that he ran out of testing strips ~1 week ago. Saw PCP via telemedicine 02/02/2019 and regimen was adjusted. Reports sugars remain high. Gives range of 190s - 200s. Presented patient with option of increasing insulin or improving diet/lifestyle and he opted for the later. No regimen change. Will send testing supplies to a Medicare A/B contracted pharmacy as [facility] is not.

## 2019-03-03 MED FILL — DULoxetine HCL 60 MG CPEP: 60 | 30 days supply | Qty: 30 | Fill #1

## 2019-03-07 MED FILL — traMADol HCL 50 MG TABS: 50 | 30 days supply | Qty: 60 | Fill #1

## 2019-03-22 ENCOUNTER — Other Ambulatory Visit: Payer: Self-pay | Admitting: Pharmacist

## 2019-03-22 MED ORDER — WARFARIN SODIUM 2.5 MG PO TABS: ORAL_TABLET | ORAL | 2 refills | Status: DC

## 2019-03-22 MED FILL — WARFARIN NA 2.5 MG TAB: 2.5 | 30 days supply | Qty: 30 | Fill #0

## 2019-03-31 ENCOUNTER — Encounter (HOSPITAL_BASED_OUTPATIENT_CLINIC_OR_DEPARTMENT_OTHER): Payer: Medicare Other | Admitting: Family Medicine

## 2019-03-31 ENCOUNTER — Other Ambulatory Visit: Payer: Self-pay

## 2019-03-31 ENCOUNTER — Ambulatory Visit: Payer: Medicare Other | Attending: Family Medicine | Admitting: Pharmacist

## 2019-03-31 DIAGNOSIS — I2699 Other pulmonary embolism without acute cor pulmonale: Secondary | ICD-10-CM

## 2019-03-31 DIAGNOSIS — L0103 Bullous impetigo: Secondary | ICD-10-CM | POA: Diagnosis not present

## 2019-03-31 DIAGNOSIS — I825Y2 Chronic embolism and thrombosis of unspecified deep veins of left proximal lower extremity: Secondary | ICD-10-CM | POA: Insufficient documentation

## 2019-03-31 LAB — POCT INR: INR: 2 (ref 2.0–3.0)

## 2019-03-31 MED ORDER — CEPHALEXIN 500 MG PO CAPS: 500.0000 mg | ORAL_CAPSULE | Freq: Two times a day (BID) | ORAL | 0 refills | Status: DC

## 2019-03-31 MED FILL — CEPHALEXIN 500 MG CAPSULE: 500 | 10 days supply | Qty: 20 | Fill #0

## 2019-03-31 NOTE — Telephone Encounter (Signed)
Patient with a history of type 2 diabetes mellitus, hemoglobin A1c of 10.1 from 10/2018, pulmonary embolism on lifelong anticoagulation with Eliquis.  Had a dog scratch 24 hours ago with development of bullae overnight. We will commence on prophylactic antibiotic given comorbidities and he will need to notify the clinic if symptoms worsen.  Diagnosis - Bullous Impetigo

## 2019-04-04 MED FILL — traMADol HCL 50 MG TABS: 50 | 30 days supply | Qty: 60 | Fill #2

## 2019-04-04 MED FILL — DULoxetine HCL 60 MG CPEP: 60 | 90 days supply | Qty: 90 | Fill #0

## 2019-04-07 ENCOUNTER — Encounter: Payer: Self-pay | Admitting: Family Medicine

## 2019-04-07 ENCOUNTER — Other Ambulatory Visit: Payer: Self-pay

## 2019-04-07 ENCOUNTER — Ambulatory Visit: Payer: Medicare Other | Attending: Family Medicine | Admitting: Family Medicine

## 2019-04-07 VITALS — BP 124/76 | HR 90 | Temp 97.9°F | Ht 72.0 in | Wt 363.0 lb

## 2019-04-07 DIAGNOSIS — Z8249 Family history of ischemic heart disease and other diseases of the circulatory system: Secondary | ICD-10-CM | POA: Insufficient documentation

## 2019-04-07 DIAGNOSIS — Z833 Family history of diabetes mellitus: Secondary | ICD-10-CM | POA: Diagnosis not present

## 2019-04-07 DIAGNOSIS — Z7901 Long term (current) use of anticoagulants: Secondary | ICD-10-CM | POA: Insufficient documentation

## 2019-04-07 DIAGNOSIS — Z9114 Patient's other noncompliance with medication regimen: Secondary | ICD-10-CM | POA: Insufficient documentation

## 2019-04-07 DIAGNOSIS — F909 Attention-deficit hyperactivity disorder, unspecified type: Secondary | ICD-10-CM | POA: Diagnosis not present

## 2019-04-07 DIAGNOSIS — E1140 Type 2 diabetes mellitus with diabetic neuropathy, unspecified: Secondary | ICD-10-CM | POA: Insufficient documentation

## 2019-04-07 DIAGNOSIS — Z823 Family history of stroke: Secondary | ICD-10-CM | POA: Insufficient documentation

## 2019-04-07 DIAGNOSIS — Z8261 Family history of arthritis: Secondary | ICD-10-CM | POA: Insufficient documentation

## 2019-04-07 DIAGNOSIS — I825Y2 Chronic embolism and thrombosis of unspecified deep veins of left proximal lower extremity: Secondary | ICD-10-CM | POA: Diagnosis not present

## 2019-04-07 DIAGNOSIS — F319 Bipolar disorder, unspecified: Secondary | ICD-10-CM | POA: Diagnosis not present

## 2019-04-07 DIAGNOSIS — Z86718 Personal history of other venous thrombosis and embolism: Secondary | ICD-10-CM | POA: Diagnosis not present

## 2019-04-07 DIAGNOSIS — Z9111 Patient's noncompliance with dietary regimen: Secondary | ICD-10-CM | POA: Diagnosis not present

## 2019-04-07 DIAGNOSIS — Z888 Allergy status to other drugs, medicaments and biological substances status: Secondary | ICD-10-CM | POA: Diagnosis not present

## 2019-04-07 DIAGNOSIS — M79606 Pain in leg, unspecified: Secondary | ICD-10-CM | POA: Diagnosis present

## 2019-04-07 DIAGNOSIS — Z794 Long term (current) use of insulin: Secondary | ICD-10-CM | POA: Insufficient documentation

## 2019-04-07 DIAGNOSIS — Z79899 Other long term (current) drug therapy: Secondary | ICD-10-CM | POA: Insufficient documentation

## 2019-04-07 DIAGNOSIS — Z6841 Body Mass Index (BMI) 40.0 and over, adult: Secondary | ICD-10-CM | POA: Diagnosis not present

## 2019-04-07 DIAGNOSIS — L03115 Cellulitis of right lower limb: Secondary | ICD-10-CM | POA: Insufficient documentation

## 2019-04-07 LAB — GLUCOSE, POCT (MANUAL RESULT ENTRY): POC Glucose: 364 mg/dl — AB (ref 70–99)

## 2019-04-07 LAB — POCT INR: INR: 2 (ref 2.0–3.0)

## 2019-04-07 MED ORDER — DOXYCYCLINE HYCLATE 100 MG PO TABS: 100.0000 mg | ORAL_TABLET | Freq: Two times a day (BID) | ORAL | 0 refills | Status: DC

## 2019-04-07 MED ORDER — METFORMIN HCL 500 MG PO TABS: 1000.0000 mg | ORAL_TABLET | Freq: Two times a day (BID) | ORAL | 3 refills | Status: DC

## 2019-04-07 MED FILL — DOXYCYCLINE HYCLATE 100 MG: 100 | 10 days supply | Qty: 20 | Fill #0

## 2019-04-07 MED FILL — metFORMIN HCL 500 MG TABS: 500 | 30 days supply | Qty: 120 | Fill #0

## 2019-04-07 NOTE — Progress Notes (Addendum)
Subjective:  Patient ID: Bradley [name], male    DOB: [birth year]  Age: 38 y.o. MRN: [id number]  CC: Leg Pain   HPI Bradley [name]  is a 38 year old male with a history of morbid obesity, type 2 diabetes mellitus (A1c 10.1 ), diabetic neuropathy, Bipolar disorder, previous DVT in 2017, PE (diagnosed in 10/2018 - on chronic anticoagulation with Coumadin). He had sent a MyChart message to me along with a picture of a lesion on his left leg on 03/31/2019 which he sustained the day before when his dog scratched him.  A bullae had developed overnight but he denied fever, leg swelling and was placed on Keflex prophylactically which she has been compliant with. He presents today with erythema surrounding the lesion which had popped but he has developed a secondary fluid containing bullae.  He denies fever, chills, calf pain.  His blood sugar is 364 this morning and he informs me he just had a cup of iced tea prior to coming to the clinic.  He had also decreased his Lantus to 15 units instead of the 20 units which had been prescribed.  Past Medical History:  Diagnosis Date  . ADHD   . ADHD (attention deficit hyperactivity disorder)   . Bipolar 1 disorder ([place])   . Bipolar disorder ([place])   . Diabetes mellitus without complication ([place])   . Morbid obesity ([place])   . Obesity   . Panic attack   . Varicose veins of both lower extremities with pain     Past Surgical History:  Procedure Laterality Date  . surgery on meatus as a child      Family History  Problem Relation Age of Onset  . Rheum arthritis Mother   . Diabetes Mother   . Heart failure Mother   . Stroke Mother     Allergies  Allergen Reactions  . Gabapentin Other (See Comments)    Crying spells  . Lyrica [Pregabalin] Other (See Comments)    Makes the patient somnolent    Outpatient Medications Prior to Visit  Medication Sig Dispense Refill  . albuterol (VENTOLIN HFA) 108 (90 Base) MCG/ACT inhaler Inhale 1-2 puffs into  the lungs every 6 (six) hours as needed for wheezing or shortness of breath. 1 Inhaler 1  . Blood Glucose Monitoring Suppl (ONETOUCH VERIO) w/Device KIT Use to check blood sugar up to 3 times daily (E11.69, Z79.4) 1 kit 0  . cephALEXin (KEFLEX) 500 MG capsule Take 1 capsule (500 mg total) by mouth 2 (two) times daily. 20 capsule 0  . DULoxetine (CYMBALTA) 60 MG capsule Take 1 capsule (60 mg total) by mouth daily. 30 capsule 3  . glipiZIDE (GLUCOTROL) 10 MG tablet Take 1 tablet (10 mg total) by mouth 2 (two) times daily before a meal. 60 tablet 3  . glucose blood (ONETOUCH VERIO) test strip Use to check blood sugar up to 3 times daily (E11.69, Z79.4) 100 each 11  . Insulin Glargine (BASAGLAR KWIKPEN) 100 UNIT/ML SOPN Inject 0.2 mLs (20 Units total) into the skin at bedtime. 30 mL 3  . Insulin Pen Needle (TRUEPLUS PEN NEEDLES) 32G X 4 MM MISC Use as directed to administer victoza 100 each 3  . Menthol (ICY HOT ADVANCED RELIEF) 7.5 % PTCH Apply 1 patch topically as needed (pain).    . metFORMIN (GLUCOPHAGE-XR) 500 MG 24 hr tablet TAKE 2 TABLETS WITH BREAKFAST 60 tablet 3  . OneTouch Delica Lancets 40G MISC Use to check blood sugar up to 3  times daily (E11.69, Z79.4) 100 each 11  . traMADol (ULTRAM) 50 MG tablet Take 1 tablet (50 mg total) by mouth every 12 (twelve) hours as needed (for pain). 60 tablet 2  . warfarin (COUMADIN) 2.5 MG tablet Take as directed by the Coumadin Clinic. 30 tablet 2  . brompheniramine-pseudoephedrine-DM 30-2-10 MG/5ML syrup Take 5 mLs by mouth 4 (four) times daily as needed. (Patient not taking: Reported on 11/27/2018) 120 mL 0  . HYDROcodone-acetaminophen (NORCO/VICODIN) 5-325 MG tablet Take 1-2 tablets by mouth every 4 (four) hours as needed for severe pain. (Patient not taking: Reported on 02/02/2019) 20 tablet 0   No facility-administered medications prior to visit.      ROS Review of Systems  Constitutional: Negative for activity change and appetite change.  HENT:  Negative for sinus pressure and sore throat.   Eyes: Negative for visual disturbance.  Respiratory: Negative for cough, chest tightness and shortness of breath.   Cardiovascular: Negative for chest pain and leg swelling.  Gastrointestinal: Negative for abdominal distention, abdominal pain, constipation and diarrhea.  Endocrine: Negative.   Genitourinary: Negative for dysuria.  Musculoskeletal: Negative for joint swelling and myalgias.  Skin:       See HPI  Allergic/Immunologic: Negative.   Neurological: Negative for weakness, light-headedness and numbness.  Psychiatric/Behavioral: Negative for dysphoric mood and suicidal ideas.    Objective:  BP 124/76   Pulse 90   Temp 97.9 F (36.6 C) (Oral)   Ht 6' (1.829 m)   Wt (!) 363 lb (164.7 kg)   SpO2 100%   BMI 49.23 kg/m   BP/Weight 04/07/2019 12/09/2018 5/00/9381  Systolic BP 829 937 169  Diastolic BP 76 83 81  Wt. (Lbs) 363 360 -  BMI 49.23 48.82 -  Some encounter information is confidential and restricted. Go to Review Flowsheets activity to see all data.      Physical Exam Constitutional:      Appearance: He is well-developed.  Cardiovascular:     Rate and Rhythm: Normal rate.     Heart sounds: Normal heart sounds. No murmur.  Pulmonary:     Effort: Pulmonary effort is normal.     Breath sounds: Normal breath sounds. No wheezing or rales.  Chest:     Chest wall: No tenderness.  Abdominal:     General: Bowel sounds are normal. There is no distension.     Palpations: Abdomen is soft. There is no mass.     Tenderness: There is no abdominal tenderness.  Musculoskeletal:     Comments: See image below Negative Homans sign  Neurological:     Mental Status: He is alert and oriented to person, place, and time.       CMP Latest Ref Rng & Units 11/26/2018 11/19/2018 05/17/2018  Glucose 70 - 99 mg/dL 183(H) 249(H) 131(H)  BUN 6 - 20 mg/dL <5(L) 8 9  Creatinine 0.61 - 1.24 mg/dL 0.76 0.70(L) 0.78  Sodium 135 - 145 mmol/L  138 139 141  Potassium 3.5 - 5.1 mmol/L 4.2 4.8 4.1  Chloride 98 - 111 mmol/L 103 101 101  CO2 22 - 32 mmol/L _0 Calcium 8.9 - 10.3 mg/dL 9.3 9.0 9.2  Total Protein 6.5 - 8.1 g/dL 6.8 6.9 6.6  Total Bilirubin 0.3 - 1.2 mg/dL 0.9 1.1 0.6  Alkaline Phos 38 - 126 U/L 65 74 64  AST 15 - 41 U/L _1 ALT 0 - 44 U/L 27 40 22    Lipid Panel  Component Value Date/Time   CHOL 132 11/19/2018 1441   TRIG 115 11/19/2018 1441   HDL 37 (L) 11/19/2018 1441   CHOLHDL 3.6 11/19/2018 1441   CHOLHDL 4.0 11/12/2016 1613   VLDL 28 11/12/2016 1613   LDLCALC 72 11/19/2018 1441    CBC    Component Value Date/Time   WBC 6.0 11/27/2018 1057   RBC 4.90 11/27/2018 1057   HGB 13.7 11/27/2018 1057   HGB 16.5 05/20/2017 1549   HCT 40.9 11/27/2018 1057   HCT 48.5 05/20/2017 1549   PLT 99 (L) 11/27/2018 1057   PLT 140 (L) 05/20/2017 1549   MCV 83.5 11/27/2018 1057   MCV 85 05/20/2017 1549   MCH 28.0 11/27/2018 1057   MCHC 33.5 11/27/2018 1057   RDW 13.2 11/27/2018 1057   RDW 14.6 05/20/2017 1549   LYMPHSABS 1.8 11/26/2018 2238   LYMPHSABS 2.5 05/20/2017 1549   MONOABS 0.5 11/26/2018 2238   EOSABS 0.3 11/26/2018 2238   EOSABS 0.2 05/20/2017 1549   BASOSABS 0.0 11/26/2018 2238   BASOSABS 0.0 05/20/2017 1549    Lab Results  Component Value Date   HGBA1C 11.3 (A) 02/24/2019    Assessment & Plan:   1. Type 2 diabetes mellitus with diabetic neuropathy, with long-term current use of insulin (HCC) Uncontrolled with A1c of 11.3 Elevated CBG of 364-he just had a cup of sweet tea prior to coming into the clinic He has not been compliant with prescribed dose of Lantus and has been advised to increase his Lantus to 20 units rather than 15 units which he has been taking Noncompliant with a diabetic diet - POCT glucose (manual entry)  2. Chronic deep vein thrombosis (DVT) of proximal vein of left lower extremity (HCC) INR is 2.0 today and was previously 2.0 a week ago and prior to that  was 2.2 He had no bump in INR despite antibiotic use over the last week; we will hold off on adjusting Coumadin dose today but will have him see the clinical pharmacist next week for an INR check - INR - CBC with Differential/Platelet  3. Cellulitis of right lower extremity Worsened compared to pictures seen last week High suspicion for MRSA Advised to discontinue Keflex and will place on doxycycline; holding off on Bactrim as he is very sensitive to side effects from medications. If symptoms do not improve in 2 days he has been advised to go to the ED. If symptoms improve he will send me pictures via my chart so I can monitor for improvement and I will see him next week. - doxycycline (VIBRA-TABS) 100 MG tablet; Take 1 tablet (100 mg total) by mouth 2 (two) times daily.  Dispense: 20 tablet; Refill: 0   Health Care Maintenance: Needs eye exam and Tdap.  Will address at next visit  Meds ordered this encounter  Medications  . doxycycline (VIBRA-TABS) 100 MG tablet    Sig: Take 1 tablet (100 mg total) by mouth 2 (two) times daily.    Dispense:  20 tablet    Refill:  0    Follow-up: No follow-ups on file.       Charlott Rakes, MD, FAAFP. [facility], [place]   04/07/2019, 11:31 AM

## 2019-04-11 MED FILL — BASAGLAR 100 UNIT/ML KWIKPE: 100 | 30 days supply | Qty: 6 | Fill #1

## 2019-04-14 ENCOUNTER — Emergency Department (HOSPITAL_COMMUNITY)
Admission: EM | Admit: 2019-04-14 | Discharge: 2019-04-14 | Disposition: A | Discharge status: Home / Self Care | Payer: Medicare Other | Attending: Emergency Medicine | Admitting: Emergency Medicine

## 2019-04-14 ENCOUNTER — Emergency Department (HOSPITAL_COMMUNITY): Payer: Medicare Other

## 2019-04-14 ENCOUNTER — Other Ambulatory Visit: Payer: Self-pay | Admitting: Family Medicine

## 2019-04-14 ENCOUNTER — Other Ambulatory Visit: Payer: Self-pay

## 2019-04-14 ENCOUNTER — Encounter (HOSPITAL_COMMUNITY): Payer: Self-pay | Admitting: *Deleted

## 2019-04-14 ENCOUNTER — Ambulatory Visit: Payer: Medicare Other | Admitting: Pharmacist

## 2019-04-14 DIAGNOSIS — L03116 Cellulitis of left lower limb: Secondary | ICD-10-CM | POA: Diagnosis not present

## 2019-04-14 DIAGNOSIS — Z03818 Encounter for observation for suspected exposure to other biological agents ruled out: Secondary | ICD-10-CM | POA: Diagnosis not present

## 2019-04-14 DIAGNOSIS — F17210 Nicotine dependence, cigarettes, uncomplicated: Secondary | ICD-10-CM | POA: Diagnosis not present

## 2019-04-14 DIAGNOSIS — Z7901 Long term (current) use of anticoagulants: Secondary | ICD-10-CM | POA: Insufficient documentation

## 2019-04-14 DIAGNOSIS — Z20828 Contact with and (suspected) exposure to other viral communicable diseases: Secondary | ICD-10-CM | POA: Diagnosis not present

## 2019-04-14 DIAGNOSIS — I10 Essential (primary) hypertension: Secondary | ICD-10-CM | POA: Insufficient documentation

## 2019-04-14 DIAGNOSIS — Z794 Long term (current) use of insulin: Secondary | ICD-10-CM | POA: Diagnosis not present

## 2019-04-14 DIAGNOSIS — S81802A Unspecified open wound, left lower leg, initial encounter: Secondary | ICD-10-CM | POA: Diagnosis not present

## 2019-04-14 DIAGNOSIS — I2699 Other pulmonary embolism without acute cor pulmonale: Secondary | ICD-10-CM | POA: Diagnosis not present

## 2019-04-14 DIAGNOSIS — I825Y2 Chronic embolism and thrombosis of unspecified deep veins of left proximal lower extremity: Secondary | ICD-10-CM

## 2019-04-14 DIAGNOSIS — E119 Type 2 diabetes mellitus without complications: Secondary | ICD-10-CM | POA: Insufficient documentation

## 2019-04-14 DIAGNOSIS — Z4800 Encounter for change or removal of nonsurgical wound dressing: Secondary | ICD-10-CM | POA: Diagnosis present

## 2019-04-14 DIAGNOSIS — L97922 Non-pressure chronic ulcer of unspecified part of left lower leg with fat layer exposed: Secondary | ICD-10-CM

## 2019-04-14 LAB — CBC WITH DIFFERENTIAL/PLATELET
Abs Immature Granulocytes: 0.03 10*3/uL (ref 0.00–0.07)
Basophils Absolute: 0 10*3/uL (ref 0.0–0.1)
Basophils Relative: 1 %
Eosinophils Absolute: 0.1 10*3/uL (ref 0.0–0.5)
Eosinophils Relative: 2 %
HCT: 47.5 % (ref 39.0–52.0)
Hemoglobin: 15.7 g/dL (ref 13.0–17.0)
Immature Granulocytes: 1 %
Lymphocytes Relative: 24 %
Lymphs Abs: 1.4 10*3/uL (ref 0.7–4.0)
MCH: 27.8 pg (ref 26.0–34.0)
MCHC: 33.1 g/dL (ref 30.0–36.0)
MCV: 84.2 fL (ref 80.0–100.0)
Monocytes Absolute: 0.4 10*3/uL (ref 0.1–1.0)
Monocytes Relative: 6 %
Neutro Abs: 3.9 10*3/uL (ref 1.7–7.7)
Neutrophils Relative %: 66 %
Platelets: 88 10*3/uL — ABNORMAL LOW (ref 150–400)
RBC: 5.64 MIL/uL (ref 4.22–5.81)
RDW: 13.2 % (ref 11.5–15.5)
WBC: 5.9 10*3/uL (ref 4.0–10.5)
nRBC: 0 % (ref 0.0–0.2)

## 2019-04-14 LAB — COMPREHENSIVE METABOLIC PANEL
ALT: 43 U/L (ref 0–44)
AST: 33 U/L (ref 15–41)
Albumin: 3.3 g/dL — ABNORMAL LOW (ref 3.5–5.0)
Alkaline Phosphatase: 94 U/L (ref 38–126)
Anion gap: 9 (ref 5–15)
BUN: 6 mg/dL (ref 6–20)
CO2: 24 mmol/L (ref 22–32)
Calcium: 8.5 mg/dL — ABNORMAL LOW (ref 8.9–10.3)
Chloride: 101 mmol/L (ref 98–111)
Creatinine, Ser: 0.78 mg/dL (ref 0.61–1.24)
GFR calc Af Amer: >60 mL/min (ref >60)
GFR calc non Af Amer: >60 mL/min (ref >60)
Glucose, Bld: 374 mg/dL — ABNORMAL HIGH (ref 70–99)
Potassium: 4.2 mmol/L (ref 3.5–5.1)
Sodium: 134 mmol/L — ABNORMAL LOW (ref 135–145)
Total Bilirubin: 0.6 mg/dL (ref 0.3–1.2)
Total Protein: 6.3 g/dL — ABNORMAL LOW (ref 6.5–8.1)

## 2019-04-14 LAB — URINALYSIS, ROUTINE W REFLEX MICROSCOPIC
Bacteria, UA: NONE SEEN
Bilirubin Urine: NEGATIVE
Glucose, UA: >=500 mg/dL — AB
Hgb urine dipstick: NEGATIVE
Ketones, ur: NEGATIVE mg/dL
Nitrite: NEGATIVE
Protein, ur: NEGATIVE mg/dL
Specific Gravity, Urine: 1.028 (ref 1.005–1.030)
pH: 6 (ref 5.0–8.0)

## 2019-04-14 LAB — PROTIME-INR
INR: 2 — ABNORMAL HIGH (ref 0.8–1.2)
Prothrombin Time: 22.3 seconds — ABNORMAL HIGH (ref 11.4–15.2)

## 2019-04-14 LAB — POCT INR: INR: 2.2 (ref 2.0–3.0)

## 2019-04-14 LAB — SARS CORONAVIRUS 2 BY RT PCR (HOSPITAL ORDER, PERFORMED IN ~~LOC~~ HOSPITAL LAB): SARS Coronavirus 2: NEGATIVE

## 2019-04-14 MED ORDER — SODIUM CHLORIDE 0.9 % IV BOLUS
1000.0000 mL | Freq: Once | INTRAVENOUS | Status: AC
  Administered 2019-04-14: 1000 mL via INTRAVENOUS

## 2019-04-14 MED ORDER — CLINDAMYCIN PHOSPHATE 600 MG/50ML IV SOLN: 600.0000 mg | Freq: Once | INTRAVENOUS | Status: DC

## 2019-04-14 MED ORDER — CLINDAMYCIN HCL 300 MG PO CAPS: 300.0000 mg | ORAL_CAPSULE | Freq: Three times a day (TID) | ORAL | 0 refills | Status: AC

## 2019-04-14 MED ORDER — CLINDAMYCIN HCL 150 MG PO CAPS
300.0000 mg | ORAL_CAPSULE | Freq: Once | ORAL | Status: AC
  Administered 2019-04-14: 300 mg via ORAL
  Filled 2019-04-14: qty 2

## 2019-04-14 NOTE — ED Provider Notes (Signed)
[facility] EMERGENCY DEPARTMENT Provider Note   CSN: [id number] Arrival date & time: 04/14/19  1549  History   Chief Complaint Chief Complaint  Patient presents with  . Wound Check   HPI Bradley [name] is a 38 y.o. male with past history significant for bipolar disorder, diabetes on insulin, obesity, PE on coumadin who presents for evaluation of leg pain.  Patient states he suffered a wound to the anterior surface of his left lower extremity over his Tibia/fibula on 03/31/19.  States he woke up the next morning and had a large "blister" the size of his hand to his left lower extremity that next morning.  Patient states he took a picture and showed his PCP who prescribed Keflex.  Patient was seen on 04/07/2019 for wound follow-up and noted to have worsening erythema, purulent drainage from the wound.  He was prescribed doxycycline.  Patient states he took a course of his doxycycline however has continued to have drainage from the wound.  Patient states he continues to have surrounding erythema from the wound.  Wound is draining yellow purulent discharge.  Patient states there is been persistent discharge to the wound every time he changes his bandages daily.  Has noticed left lower extremity swelling with increasing pain.  He has mild pain with ambulation secondary to this.  Denies fever, chills, nausea, vomiting, chest pain, abdominal pain, numbness or tingling to his extremities.  Has not taken anything for pain.  Patient has noted that his blood sugars have been elevated over the last course of the week.  Denies additional aggravating or alleviating factors.  Patient was seen by PCP today and sent to the emergency department for evaluation due to failed outpatient treatment with antibiotics for cellulitis wound. No CP, hemoptysis.  History obtained from patient and past medical records.  No interpreter was used.   PCP- [facility]  Last PO intake at  3pm     HPI  Past Medical History:  Diagnosis Date  . ADHD   . ADHD (attention deficit hyperactivity disorder)   . Bipolar 1 disorder ([place])   . Bipolar disorder ([place])   . Diabetes mellitus without complication ([place])   . Morbid obesity ([place])   . Obesity   . Panic attack   . Varicose veins of both lower extremities with pain     Patient Active Problem List   Diagnosis Date Noted  . Pulmonary emboli ([place]) 11/27/2018  . Thrombocytopenia ([place]) 11/27/2018  . Acute pulmonary embolism without acute cor pulmonale, unspecified pulmonary embolism type ([place]) 11/27/2018  . Splenomegaly 11/27/2018  . Focal nodular hyperplasia of liver 11/27/2018  . Diabetic neuropathy ([place]) 05/12/2018  . Chronic pain syndrome 05/20/2017  . Bipolar disorder, current episode mixed, moderate ([place]) 03/04/2017  . Varicose veins of both lower extremities with pain 02/18/2017  . Chronic deep vein thrombosis (DVT) of proximal vein of left lower extremity ([place]) 11/12/2016  . Smoking addiction 06/19/2016  . HTN (hypertension) 07/27/2013  . Morbid obesity ([place]) 07/27/2013  . Bipolar 1 disorder, mixed, moderate ([place]) 07/27/2013  . DM2 (diabetes mellitus, type 2) ([place]) 04/26/2013    Past Surgical History:  Procedure Laterality Date  . surgery on meatus as a child          Home Medications    Prior to Admission medications   Medication Sig Start Date End Date Taking? Authorizing Provider  albuterol (VENTOLIN HFA) 108 (90 Base) MCG/ACT inhaler Inhale 1-2 puffs into the lungs  every 6 (six) hours as needed for wheezing or shortness of breath. 02/02/19   Charlott Rakes, MD  Blood Glucose Monitoring Suppl (ONETOUCH VERIO) w/Device KIT Use to check blood sugar up to 3 times daily (E11.69, Z79.4) 02/24/19   Charlott Rakes, MD  brompheniramine-pseudoephedrine-DM 30-2-10 MG/5ML syrup Take 5 mLs by mouth 4 (four) times daily as needed. Patient not taking: Reported on 11/27/2018 11/06/18   Wieters, Hallie C, PA-C   cephALEXin (KEFLEX) 500 MG capsule Take 1 capsule (500 mg total) by mouth 2 (two) times daily. 03/31/19   Charlott Rakes, MD  clindamycin (CLEOCIN) 300 MG capsule Take 1 capsule (300 mg total) by mouth 3 (three) times daily for 10 days. 04/14/19 04/24/19  Deyani Hegarty A, PA-C  doxycycline (VIBRA-TABS) 100 MG tablet Take 1 tablet (100 mg total) by mouth 2 (two) times daily. 04/07/19   Charlott Rakes, MD  DULoxetine (CYMBALTA) 60 MG capsule Take 1 capsule (60 mg total) by mouth daily. 02/02/19   Charlott Rakes, MD  glipiZIDE (GLUCOTROL) 10 MG tablet Take 1 tablet (10 mg total) by mouth 2 (two) times daily before a meal. 02/02/19   Charlott Rakes, MD  glucose blood (ONETOUCH VERIO) test strip Use to check blood sugar up to 3 times daily (E11.69, Z79.4) 02/24/19   Charlott Rakes, MD  HYDROcodone-acetaminophen (NORCO/VICODIN) 5-325 MG tablet Take 1-2 tablets by mouth every 4 (four) hours as needed for severe pain. Patient not taking: Reported on 02/02/2019 11/27/18   Norval Morton, MD  Insulin Glargine (BASAGLAR KWIKPEN) 100 UNIT/ML SOPN Inject 0.2 mLs (20 Units total) into the skin at bedtime. 02/24/19   Charlott Rakes, MD  Insulin Pen Needle (TRUEPLUS PEN NEEDLES) 32G X 4 MM MISC Use as directed to administer victoza 08/16/18   Newlin, Charlane Ferretti, MD  Menthol (ICY HOT ADVANCED RELIEF) 7.5 % PTCH Apply 1 patch topically as needed (pain).    [provider]  metFORMIN (GLUCOPHAGE) 500 MG tablet Take 2 tablets (1,000 mg total) by mouth 2 (two) times daily with a meal. 04/07/19   Charlott Rakes, MD  OneTouch Delica Lancets 09G MISC Use to check blood sugar up to 3 times daily (E11.69, Z79.4) 02/24/19   Charlott Rakes, MD  traMADol (ULTRAM) 50 MG tablet Take 1 tablet (50 mg total) by mouth every 12 (twelve) hours as needed (for pain). 02/02/19   Charlott Rakes, MD  warfarin (COUMADIN) 2.5 MG tablet Take as directed by the Coumadin Clinic. 03/22/19   Charlott Rakes, MD    Family History Family History   Problem Relation Age of Onset  . Rheum arthritis Mother   . Diabetes Mother   . Heart failure Mother   . Stroke Mother     Social History Social History   Tobacco Use  . Smoking status: Current Every Day Smoker    Packs/day: 1.00    Types: Cigarettes  . Smokeless tobacco: Never Used  Substance Use Topics  . Alcohol use: Yes    Comment: Rarely  . Drug use: No   Allergies   Gabapentin and Lyrica [pregabalin]   Review of Systems Review of Systems  Constitutional: Negative.   HENT: Negative.   Respiratory: Negative.   Cardiovascular: Negative.   Gastrointestinal: Negative.   Genitourinary: Negative.   Musculoskeletal:       Swelling, redness and pain to left anterior lower extremity.  Skin: Positive for wound.  Neurological: Negative.   All other systems reviewed and are negative.  Physical Exam Updated Vital Signs BP 119/66  Pulse 84   Temp 99.2 F (37.3 C) (Oral)   Ht 6' (1.829 m)   Wt (!) 164.7 kg   SpO2 98%   BMI 49.23 kg/m   Physical Exam Vitals signs and nursing note reviewed.  Constitutional:      General: He is not in acute distress.    Appearance: He is well-developed. He is obese. He is not ill-appearing, toxic-appearing or diaphoretic.  HENT:     Head: Normocephalic and atraumatic.     Nose: Nose normal.     Mouth/Throat:     Mouth: Mucous membranes are moist.     Pharynx: Oropharynx is clear.  Eyes:     Pupils: Pupils are equal, round, and reactive to light.  Neck:     Musculoskeletal: Normal range of motion and neck supple.  Cardiovascular:     Rate and Rhythm: Normal rate and regular rhythm.     Pulses: Normal pulses.     Heart sounds: Normal heart sounds. No murmur. No friction rub. No gallop.   Pulmonary:     Effort: Pulmonary effort is normal. No respiratory distress.     Breath sounds: Normal breath sounds. No stridor. No wheezing, rhonchi or rales.  Abdominal:     General: Bowel sounds are normal. There is no distension.      Palpations: Abdomen is soft.     Tenderness: There is no abdominal tenderness. There is no guarding or rebound.  Musculoskeletal: Normal range of motion.     Comments: Tenderness to palpation to anterior surface of left lower extremity.  Swelling diffusely to left lower extremity.  No deformity.  No bony tenderness.  Full range of motion bilateral lower extremities without difficulty.  Skin:    General: Skin is warm and dry.     Comments: Erythema, warmth and swelling diffusely to anterior and left lateral surface of left lower extremity.  Patient with 6 cm, rounded ulcer with purulent drainage.  He has chronic venous stasis skin changes to bilateral lower extremities.  Neurological:     Mental Status: He is alert.     Comments: Intact sensation.  Brisk capillary refill.  Ambulatory however pain to left lower extremity with induration.          ED Treatments / Results  Labs (all labs ordered are listed, but only abnormal results are displayed) Labs Reviewed  URINALYSIS, ROUTINE W REFLEX MICROSCOPIC - Abnormal; Notable for the following components:      Result Value   Color, Urine STRAW (*)    Glucose, UA >=500 (*)    Leukocytes,Ua SMALL (*)    All other components within normal limits  CBC WITH DIFFERENTIAL/PLATELET - Abnormal; Notable for the following components:   Platelets 88 (*)    All other components within normal limits  COMPREHENSIVE METABOLIC PANEL - Abnormal; Notable for the following components:   Sodium 134 (*)    Glucose, Bld 374 (*)    Calcium 8.5 (*)    Total Protein 6.3 (*)    Albumin 3.3 (*)    All other components within normal limits  PROTIME-INR - Abnormal; Notable for the following components:   Prothrombin Time 22.3 (*)    INR 2.0 (*)    All other components within normal limits  CULTURE, BLOOD (ROUTINE X 2)  CULTURE, BLOOD (ROUTINE X 2)  AEROBIC CULTURE (SUPERFICIAL SPECIMEN)  SARS CORONAVIRUS 2 (HOSPITAL ORDER, [facility])   EKG None  Radiology Dg Tibia/fibula Left  Result Date: 04/14/2019 CLINICAL DATA:  Large wound to the anterior left leg near the midshaft tibia. Wound is roughly 2-3 inches in diameter. Wound began as a scratch from the pt's dog x2 weeks ago. He has completed a round of antibiotics. Hx of DM. Smoker. EXAM: LEFT TIBIA AND FIBULA - 2 VIEW COMPARISON:  None. FINDINGS: There is diffuse soft tissue swelling of the LOWER leg. No acute fracture or subluxation. No radiopaque foreign body or soft tissue gas. Anterior tibial cortical thickening is consistent with remote injury. IMPRESSION: 1. Soft tissue swelling. 2. No acute fracture. Electronically Signed   By: Nolon Nations M.D.   On: 04/14/2019 19:02    Procedures Procedures (including critical care time)  Medications Ordered in ED Medications  sodium chloride 0.9 % bolus 1,000 mL (1,000 mLs Intravenous New Bag/Given 04/14/19 1714)  clindamycin (CLEOCIN) capsule 300 mg (300 mg Oral Given 04/14/19 1922)   Initial Impression / Assessment and Plan / ED Course  I have reviewed the triage vital signs and the nursing notes.  Pertinent labs & imaging results that were available during my care of the patient were reviewed by me and considered in my medical decision making (see chart for details).  38 year old male appears otherwise well presents for evaluation of left lower extremity wound.  He is afebrile, nonseptic appearing.  History of type 2 diabetes on insulin.  Patient has been on Keflex, doxycycline for wound which he sustained approximately 15 days PTA.  He is afebrile however wound has not improved with outpatient antibiotics.  Keflex x 5 days and Doxy x 9 days. Has been seen by PCP 3 times for similar issues.  Has had difficulty controlling his blood sugars at home secondary to infection.  Wound is actively draining purulent discharge and is approximately 5 cm and rounded to his left anterior shin.  He has diffuse swelling to his left  lower extremity with erythema and warmth.  Does have diffuse tenderness to his left lower extremity however is on Coumadin for chronic DVT and PE.  Low suspicion for recurrent DVT. No CP to suggest PE.  He is neurovascularly intact.  He has no bony tenderness.  Given outpatient failed therapy will obtain imaging, labs, fluids.  Patient will likely need admission for IV antibiotics. He is agreeable for possible admission.  Labs and imaging personally reviewed: CBC without leukocytosis Urinalysis negative for infection however greater than 175 glucose Metabolic panel with mild hyponatremia at 134, hyperglycemia at 374, patient given fluids PT/INR 2.0, on Coumadin for history of DVT/PE Wound culture pending Blood culture pending COVID pending  DG tib fib with soft tissue swelling.  Patient given IV fluids.  Patient now refusing IV antibiotics and admission to hospital.  Concern for worsening infection given patient has failed outpatient antibiotics and has had worsening cellulitis to his lower extremity.  Patient requesting DC home.  I discussed risk for leaving hospital with worsening infection. Discussed risk versus benefit to include death, sepsis, loss of limb, worsening leg infection, blood clot.    Patient voiced understanding of risk versus benefit and has chosen to leave [facility].  Will dc home with Clindamycin. Encouraged closed PCP follow up and return to the ED.  We discussed the nature and purpose, risks and benefits, as well as, the alternatives of treatment. Time was given to allow the opportunity to ask questions and consider their options, and after the discussion, the patient decided to refuse the offerred treatment. The patient was informed  that refusal could lead to, but was not limited to, death, permanent disability, or severe pain. If present, I asked the relatives or significant others to dissuade them without success. Prior to refusing, I determined that the  patient had the capacity to make their decision and understood the consequences of that decision. After refusal, I made every reasonable opportunity to treat them to the best of my ability.  The patient was notified that they may return to the emergency department at any time for further treatment.        Patient has been discussed with attending Dr. Venora Maples who agrees with above treatment, plan and disposition Final Clinical Impressions(s) / ED Diagnoses   Final diagnoses:  Cellulitis of left lower extremity    ED Discharge Orders         Ordered    clindamycin (CLEOCIN) 300 MG capsule  3 times daily     04/14/19 [address] Donelle Baba A, PA-C 04/14/19 1924    Jola Schmidt, MD 04/15/19 [phone]

## 2019-04-14 NOTE — ED Triage Notes (Signed)
Pt reports he was scratched by his dog. Pt has been to his PCP and has taken 2 rounds of Anti-BX.Marland Kitchen Pt sent to ED by PCP today.. Wound on Lt lower anterior leg

## 2019-04-14 NOTE — ED Notes (Signed)
Patient transported to X-ray 

## 2019-04-15 MED FILL — CLINDAMYCIN HCL 300 MG CAP: 300 | 10 days supply | Qty: 30 | Fill #0

## 2019-04-16 LAB — AEROBIC CULTURE W GRAM STAIN (SUPERFICIAL SPECIMEN): Gram Stain: NONE SEEN

## 2019-04-17 ENCOUNTER — Telehealth: Payer: Self-pay | Admitting: Emergency Medicine

## 2019-04-17 NOTE — Telephone Encounter (Signed)
Post ED Visit - Positive Culture Follow-up  Culture report reviewed by antimicrobial stewardship pharmacist: [place] Team []  Elenor Quinones, Pharm.D. []  Heide Guile, Pharm.D., BCPS AQ-ID []  Parks Neptune, Pharm.D., BCPS []  Alycia Rossetti, Pharm.D., BCPS []  [place], Pharm.D., BCPS, AAHIVP []  Legrand Como, Pharm.D., BCPS, AAHIVP []  Salome Arnt, PharmD, BCPS []  Johnnette Gourd, PharmD, BCPS []  Hughes Better, PharmD, BCPS [x]  Gorden Harms, PharmD []  Laqueta Linden, PharmD, BCPS []  Albertina Parr, PharmD  [place] Team []  Leodis Sias, PharmD []  Lindell Spar, PharmD []  Royetta Asal, PharmD []  Graylin Shiver, Rph []  Rema Fendt) Glennon Mac, PharmD []  Arlyn Dunning, PharmD []  Netta Cedars, PharmD []  Dia Sitter, PharmD []  Leone Haven, PharmD []  Gretta Arab, PharmD []  Theodis Shove, PharmD []  Peggyann Juba, PharmD []  Reuel Boom, PharmD   Positive Aerobic culture Treated with Clindamycin, organism sensitive to the same and no further patient follow-up is required at this time.  Sandi Raveling Sparrow [place] 04/17/2019, 2:24 PM

## 2019-04-19 LAB — CULTURE, BLOOD (ROUTINE X 2)
Culture: NO GROWTH
Culture: NO GROWTH
Special Requests: ADEQUATE

## 2019-04-21 MED FILL — WARFARIN NA 2.5 MG TAB: 2.5 | 30 days supply | Qty: 30 | Fill #1

## 2019-04-29 ENCOUNTER — Encounter (HOSPITAL_BASED_OUTPATIENT_CLINIC_OR_DEPARTMENT_OTHER): Payer: Medicare Other | Attending: Internal Medicine

## 2019-04-29 DIAGNOSIS — I87332 Chronic venous hypertension (idiopathic) with ulcer and inflammation of left lower extremity: Secondary | ICD-10-CM | POA: Insufficient documentation

## 2019-04-29 DIAGNOSIS — Z86718 Personal history of other venous thrombosis and embolism: Secondary | ICD-10-CM | POA: Insufficient documentation

## 2019-04-29 DIAGNOSIS — E11622 Type 2 diabetes mellitus with other skin ulcer: Secondary | ICD-10-CM | POA: Diagnosis not present

## 2019-04-29 DIAGNOSIS — L97822 Non-pressure chronic ulcer of other part of left lower leg with fat layer exposed: Secondary | ICD-10-CM | POA: Diagnosis not present

## 2019-04-29 DIAGNOSIS — I890 Lymphedema, not elsewhere classified: Secondary | ICD-10-CM | POA: Diagnosis not present

## 2019-04-29 DIAGNOSIS — Z794 Long term (current) use of insulin: Secondary | ICD-10-CM | POA: Insufficient documentation

## 2019-04-29 DIAGNOSIS — L97522 Non-pressure chronic ulcer of other part of left foot with fat layer exposed: Secondary | ICD-10-CM | POA: Diagnosis not present

## 2019-04-29 DIAGNOSIS — F17200 Nicotine dependence, unspecified, uncomplicated: Secondary | ICD-10-CM | POA: Diagnosis not present

## 2019-04-29 DIAGNOSIS — Z7901 Long term (current) use of anticoagulants: Secondary | ICD-10-CM | POA: Diagnosis not present

## 2019-04-29 DIAGNOSIS — I87312 Chronic venous hypertension (idiopathic) with ulcer of left lower extremity: Secondary | ICD-10-CM | POA: Diagnosis not present

## 2019-04-29 DIAGNOSIS — E1165 Type 2 diabetes mellitus with hyperglycemia: Secondary | ICD-10-CM | POA: Insufficient documentation

## 2019-04-29 DIAGNOSIS — Z86711 Personal history of pulmonary embolism: Secondary | ICD-10-CM | POA: Insufficient documentation

## 2019-05-02 ENCOUNTER — Other Ambulatory Visit: Payer: Self-pay | Admitting: Internal Medicine

## 2019-05-02 DIAGNOSIS — I83893 Varicose veins of bilateral lower extremities with other complications: Secondary | ICD-10-CM

## 2019-05-04 ENCOUNTER — Other Ambulatory Visit: Payer: Self-pay | Admitting: Family Medicine

## 2019-05-04 DIAGNOSIS — E1149 Type 2 diabetes mellitus with other diabetic neurological complication: Secondary | ICD-10-CM

## 2019-05-05 ENCOUNTER — Other Ambulatory Visit: Payer: Self-pay

## 2019-05-05 ENCOUNTER — Other Ambulatory Visit: Payer: Self-pay | Admitting: Pharmacist

## 2019-05-05 ENCOUNTER — Ambulatory Visit: Payer: Medicare Other | Attending: Family Medicine | Admitting: Pharmacist

## 2019-05-05 DIAGNOSIS — I825Y2 Chronic embolism and thrombosis of unspecified deep veins of left proximal lower extremity: Secondary | ICD-10-CM | POA: Insufficient documentation

## 2019-05-05 DIAGNOSIS — I2699 Other pulmonary embolism without acute cor pulmonale: Secondary | ICD-10-CM | POA: Diagnosis not present

## 2019-05-05 DIAGNOSIS — E1149 Type 2 diabetes mellitus with other diabetic neurological complication: Secondary | ICD-10-CM

## 2019-05-05 LAB — POCT INR: INR: 1.7 — AB (ref 2.0–3.0)

## 2019-05-05 MED ORDER — TRAMADOL HCL 50 MG PO TABS: 50.0000 mg | ORAL_TABLET | Freq: Two times a day (BID) | ORAL | 0 refills | Status: DC | PRN

## 2019-05-05 NOTE — Telephone Encounter (Signed)
Pt's PCP is Dr. Margarita Rana who is out of clinic. Will route to Dr. Wynetta Emery who is covering.   Pt is requesting Tramadol refill to be sent to [facility] pharmacy. Pt last picked up a 30-day supply on 04/05/19. Dr. Wynetta Emery will fill if appropriate.

## 2019-05-06 ENCOUNTER — Encounter (HOSPITAL_BASED_OUTPATIENT_CLINIC_OR_DEPARTMENT_OTHER): Payer: Medicare Other | Attending: Internal Medicine

## 2019-05-06 DIAGNOSIS — E11622 Type 2 diabetes mellitus with other skin ulcer: Secondary | ICD-10-CM | POA: Insufficient documentation

## 2019-05-06 DIAGNOSIS — L97829 Non-pressure chronic ulcer of other part of left lower leg with unspecified severity: Secondary | ICD-10-CM | POA: Insufficient documentation

## 2019-05-06 DIAGNOSIS — Z86718 Personal history of other venous thrombosis and embolism: Secondary | ICD-10-CM | POA: Diagnosis not present

## 2019-05-06 DIAGNOSIS — I87332 Chronic venous hypertension (idiopathic) with ulcer and inflammation of left lower extremity: Secondary | ICD-10-CM | POA: Insufficient documentation

## 2019-05-06 DIAGNOSIS — I890 Lymphedema, not elsewhere classified: Secondary | ICD-10-CM | POA: Diagnosis not present

## 2019-05-06 DIAGNOSIS — I87312 Chronic venous hypertension (idiopathic) with ulcer of left lower extremity: Secondary | ICD-10-CM | POA: Diagnosis not present

## 2019-05-06 DIAGNOSIS — L97822 Non-pressure chronic ulcer of other part of left lower leg with fat layer exposed: Secondary | ICD-10-CM | POA: Diagnosis not present

## 2019-05-06 MED FILL — traMADol HCL 50 MG TABS: 50 | 30 days supply | Qty: 60 | Fill #0

## 2019-05-10 ENCOUNTER — Ambulatory Visit
Admission: RE | Admit: 2019-05-10 | Discharge: 2019-05-10 | Disposition: A | Discharge status: Home / Self Care | Payer: Medicare Other | Source: Ambulatory Visit | Attending: Internal Medicine | Admitting: Internal Medicine

## 2019-05-10 DIAGNOSIS — L97829 Non-pressure chronic ulcer of other part of left lower leg with unspecified severity: Secondary | ICD-10-CM | POA: Diagnosis not present

## 2019-05-10 DIAGNOSIS — Z792 Long term (current) use of antibiotics: Secondary | ICD-10-CM | POA: Diagnosis not present

## 2019-05-10 DIAGNOSIS — I83893 Varicose veins of bilateral lower extremities with other complications: Secondary | ICD-10-CM

## 2019-05-10 DIAGNOSIS — S81802A Unspecified open wound, left lower leg, initial encounter: Secondary | ICD-10-CM | POA: Diagnosis not present

## 2019-05-10 DIAGNOSIS — I890 Lymphedema, not elsewhere classified: Secondary | ICD-10-CM | POA: Diagnosis not present

## 2019-05-10 DIAGNOSIS — I87332 Chronic venous hypertension (idiopathic) with ulcer and inflammation of left lower extremity: Secondary | ICD-10-CM | POA: Diagnosis not present

## 2019-05-10 DIAGNOSIS — F17210 Nicotine dependence, cigarettes, uncomplicated: Secondary | ICD-10-CM | POA: Diagnosis not present

## 2019-05-10 DIAGNOSIS — Z86718 Personal history of other venous thrombosis and embolism: Secondary | ICD-10-CM | POA: Diagnosis not present

## 2019-05-10 DIAGNOSIS — E11622 Type 2 diabetes mellitus with other skin ulcer: Secondary | ICD-10-CM | POA: Diagnosis not present

## 2019-05-10 NOTE — Consult Note (Signed)
Chief Complaint: Left lower extremity wound  Referring Physician(s): Robson,Michael G  PCP: Dr. Margarita Rana @ [facility]  History of Present Illness: Bradley [name] is a 38 y.o. male presenting as a scheduled consultation to [place], kindly referred by Dr. Linton Ham, for evaluation of his left lower extremity wound and candidacy of treatment of any contributing venous disease.   Bradley [name] is here today by himself for the interview.   He tells me that the wound occurred about the first of July.  The wound on his left anterior shin occurred when his dog injury him accidentally by scratching him.  He did nothing the first day, but sought medical assistance the second day because of the significant swelling, blister, and the overall worsened appearance.  He was treated initially with Keflex, and then followed up in the ED at the instructions of his PCP for further care.  He was then referred to wound care and Dr. Dellia Nims.   He has been improving since he was referred to Dr. Dellia Nims.  He continues on ABX now.  He denies any recent fever, rigors, chills.    He has a history of DVT.  His first diagnostic duplex of LLE DVT was 05/22/2016.  He then was started on [facility].  He had a follow up duplex with reflux study 04/16/2017 at [facility].  He had a recurrent episode of DVT/PE after withdrawing from [facility] in February of 2020, and now he is being treated with coumadin.   He does not wear compression stockings.   He is a 1ppd smoker.   He has tried to quit, but finds it difficult.   He tells me that he walks about 1/4 mile daily at his apt complex.   Our DVT study performed today is negative for DVT, though shows chronic changes.   Past Medical History:  Diagnosis Date  . ADHD   . ADHD (attention deficit hyperactivity disorder)   . Bipolar 1 disorder ([place])   . Bipolar disorder ([place])   . Diabetes mellitus without complication ([place])   . Morbid obesity ([place])   . Obesity   .  Panic attack   . Varicose veins of both lower extremities with pain     Past Surgical History:  Procedure Laterality Date  . surgery on meatus as a child      Allergies: Gabapentin and Lyrica [pregabalin]  Medications: Prior to Admission medications   Medication Sig Start Date End Date Taking? Authorizing Provider  albuterol (VENTOLIN HFA) 108 (90 Base) MCG/ACT inhaler Inhale 1-2 puffs into the lungs every 6 (six) hours as needed for wheezing or shortness of breath. 02/02/19   Charlott Rakes, MD  Blood Glucose Monitoring Suppl (ONETOUCH VERIO) w/Device KIT Use to check blood sugar up to 3 times daily (E11.69, Z79.4) 02/24/19   Charlott Rakes, MD  brompheniramine-pseudoephedrine-DM 30-2-10 MG/5ML syrup Take 5 mLs by mouth 4 (four) times daily as needed. 11/06/18   Wieters, Hallie C, PA-C  cephALEXin (KEFLEX) 500 MG capsule Take 1 capsule (500 mg total) by mouth 2 (two) times daily. 03/31/19   Charlott Rakes, MD  doxycycline (VIBRA-TABS) 100 MG tablet Take 1 tablet (100 mg total) by mouth 2 (two) times daily. 04/07/19   Charlott Rakes, MD  DULoxetine (CYMBALTA) 60 MG capsule Take 1 capsule (60 mg total) by mouth daily. 02/02/19   Charlott Rakes, MD  glipiZIDE (GLUCOTROL) 10 MG tablet Take 1 tablet (10 mg total) by mouth 2 (two) times daily before  a meal. 02/02/19   Charlott Rakes, MD  glucose blood (ONETOUCH VERIO) test strip Use to check blood sugar up to 3 times daily (E11.69, Z79.4) 02/24/19   Charlott Rakes, MD  HYDROcodone-acetaminophen (NORCO/VICODIN) 5-325 MG tablet Take 1-2 tablets by mouth every 4 (four) hours as needed for severe pain. 11/27/18   Norval Morton, MD  Insulin Glargine (BASAGLAR KWIKPEN) 100 UNIT/ML SOPN Inject 0.2 mLs (20 Units total) into the skin at bedtime. 02/24/19   Charlott Rakes, MD  Insulin Pen Needle (TRUEPLUS PEN NEEDLES) 32G X 4 MM MISC Use as directed to administer victoza 08/16/18   Newlin, Charlane Ferretti, MD  Menthol (ICY HOT ADVANCED RELIEF) 7.5 % PTCH Apply 1 patch  topically as needed (pain).    [provider]  metFORMIN (GLUCOPHAGE) 500 MG tablet Take 2 tablets (1,000 mg total) by mouth 2 (two) times daily with a meal. 04/07/19   Charlott Rakes, MD  OneTouch Delica Lancets 08X MISC Use to check blood sugar up to 3 times daily (E11.69, Z79.4) 02/24/19   Charlott Rakes, MD  traMADol (ULTRAM) 50 MG tablet Take 1 tablet (50 mg total) by mouth every 12 (twelve) hours as needed (for pain). 05/05/19   Ladell Pier, MD  warfarin (COUMADIN) 2.5 MG tablet Take as directed by the Coumad[facility] Clinic. 03/22/19   Charlott Rakes, MD     Family History  Problem Relation Age of Onset  . Rheum arthritis Mother   . Diabetes Mother   . Heart failure Mother   . Stroke Mother     Social History   Socioeconomic History  . Marital status: Single    Spouse name: Not on file  . Number of children: Not on file  . Years of education: Not on file  . Highest education level: Not on file  Occupational History  . Not on file  Social Needs  . Financial resource strain: Not on file  . Food insecurity    Worry: Not on file    Inability: Not on file  . Transportation needs    Medical: Not on file    Non-medical: Not on file  Tobacco Use  . Smoking status: Current Every Day Smoker    Packs/day: 1.00    Types: Cigarettes  . Smokeless tobacco: Never Used  Substance and Sexual Activity  . Alcohol use: Yes    Comment: Rarely  . Drug use: No  . Sexual activity: Not on file  Lifestyle  . Physical activity    Days per week: Not on file    Minutes per session: Not on file  . Stress: Not on file  Relationships  . Social Herbalist on phone: Not on file    Gets together: Not on file    Attends religious service: Not on file    Active member of club or organization: Not on file    Attends meetings of clubs or organizations: Not on file    Relationship status: Not on file  Other Topics Concern  . Not on file  Social History Narrative   ** Merged  History Encounter **           Review of Systems: A 12 point ROS discussed and pertinent positives are indicated in the HPI above.  All other systems are negative.  Review of Systems  Vital Signs: BP (!) 146/82 (BP Location: Right Arm)   Pulse 93   Temp 98.4 F (36.9 C)   SpO2 98%   Physical  Exam General: 38 yo male appearing  stated age.  Well-developed, well-nourished, obese.  No distress. HEENT: Atraumatic, normocephalic.  Conjugate gaze, extra-ocular motor intact. No scleral icterus or scleral injection. No lesions on external ears, nose, lips, or gums.  Oral mucosa moist, pink.  Neck: Symmetric with no goiter enlargement.  Chest/Lungs:  Symmetric chest with inspiration/expiration.  No labored breathing.    Heart:   No JVD appreciated.  Abdomen:  Soft, obese NT/ND, with + bowel sounds.   Genito-urinary: Deferred Neurologic: Alert & Oriented to person, place, and time.   Normal affect and insight.  Appropriate questions.  Moving all 4 extremities with gross sensory intact.  Extremities: Left lower extremity shows typical hemosiderin deposition in the gaiter zone.  Small scattered varicose veins bilateral.  Minimal telangiectasias bilateral. Wound on the anterior left shin is ~35m square, significantly improved from the pictures in the e-chart.  Imaging: Dg Tibia/fibula Left  Result Date: 04/14/2019 CLINICAL DATA:  Large wound to the anterior left leg near the midshaft tibia. Wound is roughly 2-3 inches in diameter. Wound began as a scratch from the pt's dog x2 weeks ago. He has completed a round of antibiotics. Hx of DM. Smoker. EXAM: LEFT TIBIA AND FIBULA - 2 VIEW COMPARISON:  None. FINDINGS: There is diffuse soft tissue swelling of the LOWER leg. No acute fracture or subluxation. No radiopaque foreign body or soft tissue gas. Anterior tibial cortical thickening is consistent with remote injury. IMPRESSION: 1. Soft tissue swelling. 2. No acute fracture. Electronically Signed   By:  ENolon NationsM.D.   On: 04/14/2019 19:02    Labs:  CBC: Recent Labs    11/26/18 2238 11/27/18 1057 04/14/19 1709  WBC 8.0 6.0 5.9  HGB 15.2 13.7 15.7  HCT 47.9 40.9 47.5  PLT 124* 99* 88*    COAGS: Recent Labs    04/07/19 1118 04/14/19 1528 04/14/19 1709 05/05/19 1115  INR 2.0 2.2 2.0* 1.7*    BMP: Recent Labs    05/17/18 1038 11/19/18 1441 11/26/18 2238 04/14/19 1709  NA 141 139 138 134*  K 4.1 4.8 4.2 4.2  CL 101 101 103 101  CO2 '21 23 28 24  '$ GLUCOSE 131* 249* 183* 374*  BUN 9 8 <5* 6  CALCIUM 9.2 9.0 9.3 8.5*  CREATININE 0.78 0.70* 0.76 0.78  GFRNONAA 115 120 >60 >60  GFRAA 133 138 >60 >60    LIVER FUNCTION TESTS: Recent Labs    05/17/18 1038 11/19/18 1441 11/26/18 2238 04/14/19 1709  BILITOT 0.6 1.1 0.9 0.6  AST '20 23 21 '$ 33  ALT 22 40 27 43  ALKPHOS 64 74 65 94  PROT 6.6 6.9 6.8 6.3*  ALBUMIN 3.9 3.9* 3.4* 3.3*    TUMOR MARKERS: No results for input(s): AFPTM, CEA, CA199, CHROMGRNA in the last 8760 hours.  Assessment and Plan:  Bradley DRahmis a 38yo male with history of left lower extremity wound that occurred after minor trauma, super-imposed venous disease.    On the left he is CEAP-6.   On the right he is CEAP-2.  I had a lengthy discussion with Bradley DBuengerregarding the multi-factorial etiology of his left sided venous symptoms, likely related to his central obesity, decreased calf-muscle pump, and history of left DVT.    I did specifically discuss venous ablation in the setting of superficial venous insufficiency, however, that he does not have significant reflux, and he is not a candidate.    I believe any further diagnostic testing  of his pelvic veins with CTV or IVUS would be low yield given the maintained respiratory phasicity of the CFV's bilateral.    I did encourage him to continue wound care, and use conservative measures to help him with his symptoms.  This includes using compression stockings, weight-loss, smoking  cessation, and daily exercise to activate the calf-muscle pump.  He understands our impression.  I do not think that surveillance of the veins will add much at this point.    Plan: - Continue current wound care. - Add daily compression therapy  - Smoking cessation - weight-loss, daily exercise for activating calf-muscle pump - Bradley Tomes has no measurable reflux of the superficial venous system to warrant ablation  -     Thank you for this interesting consult.  I greatly enjoyed meeting NISHANTH MCCAUGHAN and look forward to participating in their care.  A copy of this report was sent to the requesting provider on this date.  Electronically Signed: Corrie Mckusick 05/10/2019, 3:27 PM   I spent a total of  40 Minutes   in face to face in clinical consultation, greater than 50% of which was counseling/coordinating care for venous disease, possible chronic venous insufficiency

## 2019-05-13 DIAGNOSIS — S81802A Unspecified open wound, left lower leg, initial encounter: Secondary | ICD-10-CM | POA: Diagnosis not present

## 2019-05-13 DIAGNOSIS — I87332 Chronic venous hypertension (idiopathic) with ulcer and inflammation of left lower extremity: Secondary | ICD-10-CM | POA: Diagnosis not present

## 2019-05-13 DIAGNOSIS — I890 Lymphedema, not elsewhere classified: Secondary | ICD-10-CM | POA: Diagnosis not present

## 2019-05-13 DIAGNOSIS — L97829 Non-pressure chronic ulcer of other part of left lower leg with unspecified severity: Secondary | ICD-10-CM | POA: Diagnosis not present

## 2019-05-13 DIAGNOSIS — E11622 Type 2 diabetes mellitus with other skin ulcer: Secondary | ICD-10-CM | POA: Diagnosis not present

## 2019-05-13 DIAGNOSIS — Z86718 Personal history of other venous thrombosis and embolism: Secondary | ICD-10-CM | POA: Diagnosis not present

## 2019-05-13 DIAGNOSIS — R600 Localized edema: Secondary | ICD-10-CM | POA: Diagnosis not present

## 2019-05-19 ENCOUNTER — Ambulatory Visit: Payer: Medicare Other | Attending: Family Medicine | Admitting: Pharmacist

## 2019-05-19 ENCOUNTER — Other Ambulatory Visit: Payer: Self-pay

## 2019-05-19 DIAGNOSIS — I825Y2 Chronic embolism and thrombosis of unspecified deep veins of left proximal lower extremity: Secondary | ICD-10-CM | POA: Insufficient documentation

## 2019-05-19 DIAGNOSIS — I2699 Other pulmonary embolism without acute cor pulmonale: Secondary | ICD-10-CM | POA: Insufficient documentation

## 2019-05-19 LAB — POCT INR: INR: 1.6 — AB (ref 2.0–3.0)

## 2019-05-20 ENCOUNTER — Ambulatory Visit: Payer: Medicare Other | Attending: Family Medicine | Admitting: Licensed Clinical Social Worker

## 2019-05-20 DIAGNOSIS — F439 Reaction to severe stress, unspecified: Secondary | ICD-10-CM | POA: Diagnosis not present

## 2019-05-20 DIAGNOSIS — F419 Anxiety disorder, unspecified: Secondary | ICD-10-CM

## 2019-05-20 NOTE — Progress Notes (Signed)
Integrated Behavioral Health Visit via Telemedicine (Telephone)  05/20/2019 Bradley [name] [id number]   Session Start time: 1:10 PM  Session End time: 1:35 PM Total time: 25 minutes  Referring Provider: Dr. Margarita Rana Type of Visit: Telephonic Patient location: [name] Provider location: Office All persons participating in visit: Patient and LCSW  Confirmed patient's address: Yes  Confirmed patient's phone number: Yes  Any changes to demographics: No   Confirmed patient's insurance: Yes  Any changes to patient's insurance: No   Discussed confidentiality: Yes    The following statements were read to the patient and/or legal guardian that are established with the [facility] Provider.  "The purpose of this phone visit is to provide behavioral health care while limiting exposure to the coronavirus (COVID19).  There is a possibility of technology failure and discussed alternative modes of communication if that failure occurs."  "By engaging in this telephone visit, you consent to the provision of healthcare.  Additionally, you authorize for your insurance to be billed for the services provided during this telephone visit."   Patient and/or legal guardian consented to telephone visit: Yes   PRESENTING CONCERNS: Patient and/or family reports the following symptoms/concerns: Pt reports difficulty managing mental health triggered by strained relationship with mother. She is currently placed in a nursing facility; however, has cashed out insurance policy and becomes upset with patient when he is trying to be supportive. Pt reports that ongoing stress has negatively impacted his ability to remember to take prescribed medications to manage medical health conditions Duration of problem: Ongoing; Severity of problem: moderate  STRENGTHS (Protective Factors/Coping Skills): Pt has good insight Pt is willing to initiate psychotherapy  GOALS ADDRESSED: Patient will: 1.  Reduce symptoms  of: anxiety, depression and stress  2.  Increase knowledge and/or ability of: coping skills and healthy habits  3.  Demonstrate ability to: Increase healthy adjustment to current life circumstances, Increase adequate support systems for patient/family and Improve medication compliance  INTERVENTIONS: Interventions utilized:  Solution-Focused Strategies, Supportive Counseling and Psychoeducation and/or Health Education Standardized Assessments completed: Not Needed  ASSESSMENT: Patient currently experiencing depression and anxiety triggered by strained relationship with mother. He reports ongoing stress has negatively impacted his ability to function and medication compliance. Pt denies SI/HI. He receives limited support noting sister resides in [place].   Patient may benefit from psychotherapy. LCSW informed pt of the correlation between one's physical and mental health, in addition, to how stress can negatively impact both. Therapeutic strategies to assist with managing symptoms were discussed. Pt is open to referral for psychotherapy to address trauma.   PLAN: 1. Follow up with behavioral health clinician on : LCSW will complete referral to [facility] 2. Behavioral recommendations: LCSW recommends pt utilize strategies discussed in session and comply with medication management 3. Referral(s): [place] (In Clinic) and [facility] (LME/Outside Clinic)  Rebekah [place], [place] 05/25/2019 12:14 PM

## 2019-05-25 ENCOUNTER — Other Ambulatory Visit: Payer: Self-pay | Admitting: Pharmacist

## 2019-05-25 DIAGNOSIS — I2699 Other pulmonary embolism without acute cor pulmonale: Secondary | ICD-10-CM

## 2019-05-25 MED ORDER — WARFARIN SODIUM 2.5 MG PO TABS: ORAL_TABLET | ORAL | 2 refills | Status: DC

## 2019-05-25 MED ORDER — ALBUTEROL SULFATE HFA 108 (90 BASE) MCG/ACT IN AERS: 1.0000 | INHALATION_SPRAY | Freq: Four times a day (QID) | RESPIRATORY_TRACT | 2 refills | Status: DC | PRN

## 2019-05-25 MED FILL — WARFARIN NA 2.5 MG TAB: 2.5 | 15 days supply | Qty: 30 | Fill #0

## 2019-05-25 MED FILL — VENTOLIN HFA 90 MCG INHALER: 108 (90 BAS | 25 days supply | Qty: 18 | Fill #0

## 2019-05-26 ENCOUNTER — Ambulatory Visit: Payer: Medicare Other | Attending: Family Medicine | Admitting: Pharmacist

## 2019-05-26 ENCOUNTER — Other Ambulatory Visit: Payer: Self-pay

## 2019-05-26 ENCOUNTER — Telehealth: Payer: Self-pay | Admitting: Family Medicine

## 2019-05-26 DIAGNOSIS — I2699 Other pulmonary embolism without acute cor pulmonale: Secondary | ICD-10-CM | POA: Diagnosis not present

## 2019-05-26 DIAGNOSIS — I825Y2 Chronic embolism and thrombosis of unspecified deep veins of left proximal lower extremity: Secondary | ICD-10-CM | POA: Insufficient documentation

## 2019-05-26 LAB — POCT INR: INR: 2.2 (ref 2.0–3.0)

## 2019-05-26 NOTE — Telephone Encounter (Signed)
New Message   Pt states he would like to speak with you in regards to some referrals. Please f/u

## 2019-05-27 ENCOUNTER — Telehealth: Payer: Self-pay | Admitting: *Deleted

## 2019-05-27 NOTE — Telephone Encounter (Signed)
Patient called stating he would like for [facility] to please call him back. Per pt he was trying to give [facility] a follow up on referral in getting mental health services. Per pt he needs a therapist or a psychiatrist due to his emotional states. [phone].

## 2019-06-02 ENCOUNTER — Other Ambulatory Visit: Payer: Self-pay | Admitting: Internal Medicine

## 2019-06-02 DIAGNOSIS — E1149 Type 2 diabetes mellitus with other diabetic neurological complication: Secondary | ICD-10-CM

## 2019-06-02 NOTE — Telephone Encounter (Signed)
LCSW placed return call to patient. LCSW left message for a return call. A completed referral to [facility] was completed via fax.

## 2019-06-07 ENCOUNTER — Other Ambulatory Visit: Payer: Self-pay | Admitting: Family Medicine

## 2019-06-07 DIAGNOSIS — E1149 Type 2 diabetes mellitus with other diabetic neurological complication: Secondary | ICD-10-CM

## 2019-06-07 MED ORDER — TRAMADOL HCL 50 MG PO TABS: 50.0000 mg | ORAL_TABLET | Freq: Two times a day (BID) | ORAL | 1 refills | Status: DC | PRN

## 2019-06-07 MED FILL — traMADol HCL 50 MG TABS: 50 | 30 days supply | Qty: 60 | Fill #0

## 2019-06-16 ENCOUNTER — Telehealth: Payer: Self-pay | Admitting: Family Medicine

## 2019-06-16 NOTE — Telephone Encounter (Signed)
LCSWA attempted to follow up with patient via phone. LCSWA left message for a return call.

## 2019-06-16 NOTE — Telephone Encounter (Signed)
Patient called back in regards to missed call please follow up 

## 2019-06-22 ENCOUNTER — Ambulatory Visit: Payer: Medicare Other | Attending: Family Medicine | Admitting: Family Medicine

## 2019-06-22 ENCOUNTER — Other Ambulatory Visit: Payer: Self-pay

## 2019-06-22 ENCOUNTER — Encounter: Payer: Self-pay | Admitting: Family Medicine

## 2019-06-22 VITALS — BP 117/80 | HR 88 | Temp 98.2°F | Ht 72.0 in | Wt 353.0 lb

## 2019-06-22 DIAGNOSIS — E1165 Type 2 diabetes mellitus with hyperglycemia: Secondary | ICD-10-CM | POA: Diagnosis not present

## 2019-06-22 DIAGNOSIS — Z794 Long term (current) use of insulin: Secondary | ICD-10-CM | POA: Diagnosis not present

## 2019-06-22 DIAGNOSIS — E1140 Type 2 diabetes mellitus with diabetic neuropathy, unspecified: Secondary | ICD-10-CM | POA: Diagnosis not present

## 2019-06-22 DIAGNOSIS — F3162 Bipolar disorder, current episode mixed, moderate: Secondary | ICD-10-CM | POA: Diagnosis not present

## 2019-06-22 DIAGNOSIS — Z23 Encounter for immunization: Secondary | ICD-10-CM | POA: Diagnosis not present

## 2019-06-22 DIAGNOSIS — Z636 Dependent relative needing care at home: Secondary | ICD-10-CM

## 2019-06-22 DIAGNOSIS — E1149 Type 2 diabetes mellitus with other diabetic neurological complication: Secondary | ICD-10-CM

## 2019-06-22 LAB — GLUCOSE, POCT (MANUAL RESULT ENTRY)
POC Glucose: 426 mg/dl — AB (ref 70–99)
POC Glucose: 413 mg/dl — AB (ref 70–99)

## 2019-06-22 LAB — POCT GLYCOSYLATED HEMOGLOBIN (HGB A1C): HbA1c, POC (controlled diabetic range): 13.3 % — AB (ref 0.0–7.0)

## 2019-06-22 MED ORDER — METFORMIN HCL 500 MG PO TABS: 1000.0000 mg | ORAL_TABLET | Freq: Two times a day (BID) | ORAL | 3 refills | Status: DC

## 2019-06-22 MED ORDER — TRAMADOL HCL 50 MG PO TABS: 50.0000 mg | ORAL_TABLET | Freq: Two times a day (BID) | ORAL | 2 refills | Status: DC | PRN

## 2019-06-22 MED ORDER — DULOXETINE HCL 60 MG PO CPEP: 60.0000 mg | ORAL_CAPSULE | Freq: Every day | ORAL | 3 refills | Status: DC

## 2019-06-22 MED ORDER — INSULIN ASPART 100 UNIT/ML ~~LOC~~ SOLN
8.0000 [IU] | Freq: Once | SUBCUTANEOUS | Status: AC
  Administered 2019-06-22: 17:00:00 8 [IU] via SUBCUTANEOUS

## 2019-06-22 MED ORDER — GLIPIZIDE 10 MG PO TABS: 10.0000 mg | ORAL_TABLET | Freq: Two times a day (BID) | ORAL | 3 refills | Status: DC

## 2019-06-22 MED ORDER — BASAGLAR KWIKPEN 100 UNIT/ML ~~LOC~~ SOPN: 25.0000 [IU] | PEN_INJECTOR | Freq: Every day | SUBCUTANEOUS | 3 refills | Status: DC

## 2019-06-22 MED FILL — metFORMIN HCL 500 MG TABS: 500 | 30 days supply | Qty: 120 | Fill #0

## 2019-06-22 MED FILL — BASAGLAR 100 UNIT/ML KWIKPE: 100 | 24 days supply | Qty: 6 | Fill #0

## 2019-06-22 MED FILL — glipiZIDE 10 MG TABS: 10 | 30 days supply | Qty: 60 | Fill #0

## 2019-06-22 NOTE — Progress Notes (Signed)
Subjective:  Patient ID: Bradley [name], male    DOB: 1981/05/15  Age: 38 y.o. MRN: [id number]  CC: Diabetes   HPI Bradley [name]  is a 38 year old male with a history of morbid obesity, type 2 diabetes mellitus (A1c 13.1 ), diabetic neuropathy, Bipolar disorder, previous DVT in 2017, PE (diagnosed in 10/2018 - on chronic anticoagulation with Coumadin). His A1c in the clinic is 13.3 and blood sugar is 426 and he endorses drinking a glass of juice about 1 hour ago.  His blood sugars have been running in the 300-400 range.  Denies confusion, lethargy, abdominal pain.  NovoLog 8 units administered in the clinic today and oral hydration provided and he was observed for adverse reactions and blood sugar repeated after observation.  He endorses noncompliance with Lantus as he has been under a lot of stress as his mother is currently hospitalized in a nursing home and as per patient has the mind of a "twenty something-year-old" and he is the only family member available to provide care.  He describes mental exhaustion and was seen by the LCSW for this and currently has an upcoming appointment with psychiatry on 07/08/2019 and again on 07/22/19.  He denies suicidal ideations or intent and is on Cymbalta. He has persisting neuropathy for which he is on Cymbalta and tramadol. He previously had left lower extremity cellulitis which was managed by wound care clinic and has resolved at this time.  Regards to his pulmonary embolism his last INR was a 2.2 on 05/26/2019 and he denies bleeds from Coumadin. Past Medical History:  Diagnosis Date  . ADHD   . ADHD (attention deficit hyperactivity disorder)   . Bipolar 1 disorder ([place])   . Bipolar disorder ([place])   . Diabetes mellitus without complication ([place])   . Morbid obesity ([place])   . Obesity   . Panic attack   . Varicose veins of both lower extremities with pain     Past Surgical History:  Procedure Laterality Date  . surgery on meatus as a child       Family History  Problem Relation Age of Onset  . Rheum arthritis Mother   . Diabetes Mother   . Heart failure Mother   . Stroke Mother     Allergies  Allergen Reactions  . Gabapentin Other (See Comments)    Crying spells  . Lyrica [Pregabalin] Other (See Comments)    Makes the patient somnolent    Outpatient Medications Prior to Visit  Medication Sig Dispense Refill  . albuterol (VENTOLIN HFA) 108 (90 Base) MCG/ACT inhaler Inhale 1-2 puffs into the lungs every 6 (six) hours as needed for wheezing or shortness of breath. 18 g 2  . Blood Glucose Monitoring Suppl (ONETOUCH VERIO) w/Device KIT Use to check blood sugar up to 3 times daily (E11.69, Z79.4) 1 kit 0  . glucose blood (ONETOUCH VERIO) test strip Use to check blood sugar up to 3 times daily (E11.69, Z79.4) 100 each 11  . Insulin Pen Needle (TRUEPLUS PEN NEEDLES) 32G X 4 MM MISC Use as directed to administer victoza 100 each 3  . Menthol (ICY HOT ADVANCED RELIEF) 7.5 % PTCH Apply 1 patch topically as needed (pain).    Glory Rosebush Delica Lancets 46N MISC Use to check blood sugar up to 3 times daily (E11.69, Z79.4) 100 each 11  . warfarin (COUMADIN) 2.5 MG tablet Take as directed by the [facility] Clinic. 30 tablet 2  . DULoxetine (CYMBALTA) 60 MG capsule Take  1 capsule (60 mg total) by mouth daily. 30 capsule 3  . glipiZIDE (GLUCOTROL) 10 MG tablet Take 1 tablet (10 mg total) by mouth 2 (two) times daily before a meal. 60 tablet 3  . Insulin Glargine (BASAGLAR KWIKPEN) 100 UNIT/ML SOPN Inject 0.2 mLs (20 Units total) into the skin at bedtime. 30 mL 3  . metFORMIN (GLUCOPHAGE) 500 MG tablet Take 2 tablets (1,000 mg total) by mouth 2 (two) times daily with a meal. 120 tablet 3  . traMADol (ULTRAM) 50 MG tablet Take 1 tablet (50 mg total) by mouth every 12 (twelve) hours as needed (for pain). 60 tablet 1  . brompheniramine-pseudoephedrine-DM 30-2-10 MG/5ML syrup Take 5 mLs by mouth 4 (four) times daily as needed. (Patient not taking:  Reported on 06/22/2019) 120 mL 0  . HYDROcodone-acetaminophen (NORCO/VICODIN) 5-325 MG tablet Take 1-2 tablets by mouth every 4 (four) hours as needed for severe pain. (Patient not taking: Reported on 06/22/2019) 20 tablet 0  . cephALEXin (KEFLEX) 500 MG capsule Take 1 capsule (500 mg total) by mouth 2 (two) times daily. (Patient not taking: Reported on 06/22/2019) 20 capsule 0  . doxycycline (VIBRA-TABS) 100 MG tablet Take 1 tablet (100 mg total) by mouth 2 (two) times daily. (Patient not taking: Reported on 06/22/2019) 20 tablet 0   No facility-administered medications prior to visit.      ROS Review of Systems  Constitutional: Negative for activity change and appetite change.  HENT: Negative for sinus pressure and sore throat.   Eyes: Negative for visual disturbance.  Respiratory: Negative for cough, chest tightness and shortness of breath.   Cardiovascular: Negative for chest pain and leg swelling.  Gastrointestinal: Negative for abdominal distention, abdominal pain, constipation and diarrhea.  Endocrine: Negative.   Genitourinary: Negative for dysuria.  Musculoskeletal: Negative for joint swelling and myalgias.  Skin: Negative for rash.  Allergic/Immunologic: Negative.   Neurological: Negative for weakness, light-headedness and numbness.  Psychiatric/Behavioral: Negative for dysphoric mood and suicidal ideas.    Objective:  BP 117/80   Pulse 88   Temp 98.2 F (36.8 C) (Oral)   Ht 6' (1.829 m)   Wt (!) 353 lb (160.1 kg)   SpO2 98%   BMI 47.88 kg/m   BP/Weight 06/22/2019 05/10/2019 0/92/9574  Systolic BP 734 037 096  Diastolic BP 80 82 77  Wt. (Lbs) 353 - 363  BMI 47.88 - 49.23  Some encounter information is confidential and restricted. Go to Review Flowsheets activity to see all data.      Physical Exam Constitutional:      Appearance: He is well-developed. He is obese.  Neck:     Vascular: No JVD.  Cardiovascular:     Rate and Rhythm: Normal rate.     Heart  sounds: Normal heart sounds. No murmur.  Pulmonary:     Effort: Pulmonary effort is normal.     Breath sounds: Normal breath sounds. No wheezing or rales.  Chest:     Chest wall: No tenderness.  Abdominal:     General: Bowel sounds are normal. There is no distension.     Palpations: Abdomen is soft. There is no mass.     Tenderness: There is no abdominal tenderness.  Musculoskeletal: Normal range of motion.     Right lower leg: No edema.     Left lower leg: No edema.  Skin:    Comments: Lateral aspect of left leg with skin changes in keeping with resolved cellulitis  Neurological:  Mental Status: He is alert and oriented to person, place, and time.  Psychiatric:        Mood and Affect: Mood normal.     CMP Latest Ref Rng & Units 04/14/2019 11/26/2018 11/19/2018  Glucose 70 - 99 mg/dL 374(H) 183(H) 249(H)  BUN 6 - 20 mg/dL 6 <5(L) 8  Creatinine 0.61 - 1.24 mg/dL 0.78 0.76 0.70(L)  Sodium 135 - 145 mmol/L 134(L) 138 139  Potassium 3.5 - 5.1 mmol/L 4.2 4.2 4.8  Chloride 98 - 111 mmol/L 101 103 101  CO2 22 - 32 mmol/L '24 28 23  '$ Calcium 8.9 - 10.3 mg/dL 8.5(L) 9.3 9.0  Total Protein 6.5 - 8.1 g/dL 6.3(L) 6.8 6.9  Total Bilirubin 0.3 - 1.2 mg/dL 0.6 0.9 1.1  Alkaline Phos 38 - 126 U/L 94 65 74  AST 15 - 41 U/L 33 21 23  ALT 0 - 44 U/L 43 27 40    Lipid Panel     Component Value Date/Time   CHOL 132 11/19/2018 1441   TRIG 115 11/19/2018 1441   HDL 37 (L) 11/19/2018 1441   CHOLHDL 3.6 11/19/2018 1441   CHOLHDL 4.0 11/12/2016 1613   VLDL 28 11/12/2016 1613   LDLCALC 72 11/19/2018 1441    CBC    Component Value Date/Time   WBC 5.9 04/14/2019 1709   RBC 5.64 04/14/2019 1709   HGB 15.7 04/14/2019 1709   HGB 16.5 05/20/2017 1549   HCT 47.5 04/14/2019 1709   HCT 48.5 05/20/2017 1549   PLT 88 (L) 04/14/2019 1709   PLT 140 (L) 05/20/2017 1549   MCV 84.2 04/14/2019 1709   MCV 85 05/20/2017 1549   MCH 27.8 04/14/2019 1709   MCHC 33.1 04/14/2019 1709   RDW 13.2  04/14/2019 1709   RDW 14.6 05/20/2017 1549   LYMPHSABS 1.4 04/14/2019 1709   LYMPHSABS 2.5 05/20/2017 1549   MONOABS 0.4 04/14/2019 1709   EOSABS 0.1 04/14/2019 1709   EOSABS 0.2 05/20/2017 1549   BASOSABS 0.0 04/14/2019 1709   BASOSABS 0.0 05/20/2017 1549    Lab Results  Component Value Date   HGBA1C 13.3 (A) 06/22/2019    Assessment & Plan:   1. Type 2 diabetes mellitus with diabetic neuropathy, with long-term current use of insulin (HCC) Neuropathy is uncontrolled but stable on Cymbalta and tramadol Unable to tolerate gabapentin or Lyrica - POCT glucose (manual entry) - POCT glycosylated hemoglobin (Hb A1C) - Ambulatory referral to Podiatry  2. Type 2 diabetes mellitus with hyperglycemia, with long-term current use of insulin (HCC) High risk patient with impending DKA, hyperglycemia of 426 unable to produce urine to assess for ketones Oral hydration administered in the clinic along with 8 units of NovoLog and patient observed for 41 minutes. Noncompliance a major factor in poor control Increase Lantus from 20 units to 25 units at bedtime and discussed processes to eat compliance including setting timers Unable to afford co-pay for ophthalmology visit-advised to try [facility] Counseled on Diabetic diet, my plate method, 650 minutes of moderate intensity exercise/week Keep blood sugar logs with fasting goals of 80-120 mg/dl, random of less than 180 and in the event of sugars less than 60 mg/dl or greater than 400 mg/dl please notify the clinic ASAP. It is recommended that you undergo annual eye exams and annual foot exams. Pneumonia vaccine is recommended. - Insulin Glargine (BASAGLAR KWIKPEN) 100 UNIT/ML SOPN; Inject 0.25 mLs (25 Units total) into the skin at bedtime.  Dispense: 30 mL; Refill: 3 - glipiZIDE (  GLUCOTROL) 10 MG tablet; Take 1 tablet (10 mg total) by mouth 2 (two) times daily before a meal.  Dispense: 60 tablet; Refill: 3 - metFORMIN (GLUCOPHAGE) 500 MG tablet; Take  2 tablets (1,000 mg total) by mouth 2 (two) times daily with a meal.  Dispense: 120 tablet; Refill: 3 - POCT glucose (manual entry) - insulin aspart (novoLOG) injection 8 Units  3. Other diabetic neurological complication associated with type 2 diabetes mellitus (HCC) Neuropathy is uncontrolled but stable on Cymbalta and tramadol Unable to tolerate gabapentin or Lyrica - DULoxetine (CYMBALTA) 60 MG capsule; Take 1 capsule (60 mg total) by mouth daily.  Dispense: 30 capsule; Refill: 3 - traMADol (ULTRAM) 50 MG tablet; Take 1 tablet (50 mg total) by mouth every 12 (twelve) hours as needed (for pain).  Dispense: 60 tablet; Refill: 2  4. Bipolar 1 disorder, mixed, moderate ([place]) Currently on Cymbalta He will benefit from seeing a psychiatrist and from therapy especially due to underlying caregiver stress Has upcoming appointment with mental health next month  5. Caregiver stress Secondary to caring for his mother Provided counseling regarding coping with stress as this is affecting management of his diabetes mellitus See #4 above  6. Need for influenza vaccination - Flu Vaccine QUAD 36+ mos IM   41 minutes of total face to face time spent and greater than 50% of time spent on discussing diagnosis, investigations, treatment plan including administering insulin and monitoring for adverse effects, counseling regarding stress management and impact on diabetes mellitus, coordination of care.  Meds ordered this encounter  Medications  . Insulin Glargine (BASAGLAR KWIKPEN) 100 UNIT/ML SOPN    Sig: Inject 0.25 mLs (25 Units total) into the skin at bedtime.    Dispense:  30 mL    Refill:  3    Dose change.  . DULoxetine (CYMBALTA) 60 MG capsule    Sig: Take 1 capsule (60 mg total) by mouth daily.    Dispense:  30 capsule    Refill:  3  . glipiZIDE (GLUCOTROL) 10 MG tablet    Sig: Take 1 tablet (10 mg total) by mouth 2 (two) times daily before a meal.    Dispense:  60 tablet    Refill:  3   . metFORMIN (GLUCOPHAGE) 500 MG tablet    Sig: Take 2 tablets (1,000 mg total) by mouth 2 (two) times daily with a meal.    Dispense:  120 tablet    Refill:  3    Discontinue metformin XR  . traMADol (ULTRAM) 50 MG tablet    Sig: Take 1 tablet (50 mg total) by mouth every 12 (twelve) hours as needed (for pain).    Dispense:  60 tablet    Refill:  2  . insulin aspart (novoLOG) injection 8 Units    Follow-up: Return in about 3 months (around 09/21/2019) for medical conditions - in person.       Charlott Rakes, MD, FAAFP. [facility] [place], [place]   06/22/2019, 5:28 PM

## 2019-06-23 ENCOUNTER — Ambulatory Visit: Payer: Medicare Other | Attending: Family Medicine | Admitting: Licensed Clinical Social Worker

## 2019-06-23 DIAGNOSIS — F439 Reaction to severe stress, unspecified: Secondary | ICD-10-CM

## 2019-06-23 DIAGNOSIS — Z79899 Other long term (current) drug therapy: Secondary | ICD-10-CM | POA: Diagnosis not present

## 2019-06-24 ENCOUNTER — Other Ambulatory Visit: Payer: Self-pay

## 2019-06-24 NOTE — Progress Notes (Signed)
Integrated Behavioral Health Visit via Telemedicine (Telephone)  06/23/2019 Bradley [name] [id number]   Session Start time: 3:35 PM  Session End time: 3:45 PM Total time: 10 minutes  Referring Provider: Dr. Margarita Rana Type of Visit: Telephonic Patient location: Home [facility] Provider location: Office All persons participating in visit: Pt and LCSW  Confirmed patient's address: Yes  Confirmed patient's phone number: Yes  Any changes to demographics: No   Confirmed patient's insurance: Yes  Any changes to patient's insurance: Yes   Discussed confidentiality: No    The following statements were read to the patient and/or legal guardian that are established with the [facility] Provider.  "The purpose of this phone visit is to provide behavioral health care while limiting exposure to the coronavirus (COVID19).  There is a possibility of technology failure and discussed alternative modes of communication if that failure occurs."  "By engaging in this telephone visit, you consent to the provision of healthcare.  Additionally, you authorize for your insurance to be billed for the services provided during this telephone visit."   Patient and/or legal guardian consented to telephone visit: Yes   PRESENTING CONCERNS: Patient and/or family reports the following symptoms/concerns: Pt reports that he met with PCP regarding medical conditions. States stress has negatively impacted health Duration of problem: 2 months; Severity of problem: moderate  STRENGTHS (Protective Factors/Coping Skills): Pt has financial resources Pt has strong support system (long-time girlfriend, family) Pt has good insight  GOALS ADDRESSED: Patient will: 1.  Reduce symptoms of: anxiety, depression and stress  2.  Increase knowledge and/or ability of: healthy habits  3.  Demonstrate ability to: Increase motivation to adhere to plan of care  INTERVENTIONS: Interventions utilized:  Solution-Focused  Strategies Standardized Assessments completed: Not Needed  ASSESSMENT: Patient currently experiencing depression and anxiety triggered by difficulty managing chronic medical conditions and stress from strained relationship with mother. Pt has good insight of how stressors resulted in low motivation to comply with treatment plan to manage blood sugars and hypertension.  Patient has scheduled appointments with [facility] on July 08, 2019 for psych evaluation and July 22, 2019 for therapeutic evaluation. LCSW discussed strategies to assist pt in creating habit plan to better comply with medication management. Pt was also provided resources to assist with access to healthy foods to assist with financial strain.  PLAN: 1. Follow up with behavioral health clinician on : Pt has scheduled appointments with [facility]. Pt was encouraged to contact LCSW with any additional concerns or resource needs 2. Behavioral recommendations: Utilize resources provided and follow through with scheduled appointments 3. Referral(s): [place] (In Clinic)  Rebekah Chesterfield, [place] 06/24/2019 2:47 PM

## 2019-06-27 ENCOUNTER — Ambulatory Visit: Payer: Medicare Other | Attending: Family Medicine | Admitting: Pharmacist

## 2019-06-27 ENCOUNTER — Other Ambulatory Visit: Payer: Self-pay

## 2019-06-27 DIAGNOSIS — I2699 Other pulmonary embolism without acute cor pulmonale: Secondary | ICD-10-CM | POA: Insufficient documentation

## 2019-06-27 DIAGNOSIS — I825Y2 Chronic embolism and thrombosis of unspecified deep veins of left proximal lower extremity: Secondary | ICD-10-CM | POA: Diagnosis not present

## 2019-06-27 LAB — POCT INR: INR: 1.6 — AB (ref 2.0–3.0)

## 2019-06-27 MED FILL — WARFARIN NA 2.5 MG TAB: 2.5 | 15 days supply | Qty: 30 | Fill #1

## 2019-06-27 MED FILL — DULoxetine HCL 60 MG CPEP: 60 | 30 days supply | Qty: 30 | Fill #0

## 2019-07-05 MED FILL — traMADol HCL 50 MG TABS: 50 | 30 days supply | Qty: 60 | Fill #1

## 2019-07-08 DIAGNOSIS — F3132 Bipolar disorder, current episode depressed, moderate: Secondary | ICD-10-CM | POA: Diagnosis not present

## 2019-07-08 DIAGNOSIS — F411 Generalized anxiety disorder: Secondary | ICD-10-CM | POA: Diagnosis not present

## 2019-07-08 DIAGNOSIS — F4312 Post-traumatic stress disorder, chronic: Secondary | ICD-10-CM | POA: Diagnosis not present

## 2019-07-08 MED FILL — ARIPiprazole 5 MG TABS: 5 | 30 days supply | Qty: 30 | Fill #0

## 2019-07-08 MED FILL — DULoxetine HCL 30 MG CPEP: 30 | 30 days supply | Qty: 30 | Fill #0

## 2019-07-08 MED FILL — BUSPIRONE HCL 7.5 MG TABS: 7.5 | 30 days supply | Qty: 60 | Fill #0

## 2019-07-11 ENCOUNTER — Ambulatory Visit: Payer: Medicare Other | Attending: Family Medicine | Admitting: Pharmacist

## 2019-07-11 ENCOUNTER — Other Ambulatory Visit: Payer: Self-pay

## 2019-07-11 DIAGNOSIS — I825Y2 Chronic embolism and thrombosis of unspecified deep veins of left proximal lower extremity: Secondary | ICD-10-CM

## 2019-07-11 DIAGNOSIS — I2699 Other pulmonary embolism without acute cor pulmonale: Secondary | ICD-10-CM | POA: Diagnosis not present

## 2019-07-11 LAB — POCT INR: INR: 2 (ref 2.0–3.0)

## 2019-07-13 ENCOUNTER — Other Ambulatory Visit: Payer: Self-pay

## 2019-07-13 ENCOUNTER — Ambulatory Visit: Payer: Medicare Other | Attending: Family Medicine | Admitting: Licensed Clinical Social Worker

## 2019-07-13 DIAGNOSIS — F439 Reaction to severe stress, unspecified: Secondary | ICD-10-CM

## 2019-07-15 NOTE — BH Specialist Note (Signed)
Integrated Behavioral Health Visit via Telemedicine (Telephone)  07/13/2019 Bradley [name] [id number]   Session Start time: 3:17 PM  Session End time: 3:22 PM Total time: 5 min  Referring Provider: Dr. Margarita Rana Type of Visit: Telephonic Patient location: Home [facility] Provider location: Office All persons participating in visit: Pt and LCSW  Confirmed patient's address: No  Confirmed patient's phone number: No  Any changes to demographics: No   Confirmed patient's insurance: No  Any changes to patient's insurance: No   Discussed confidentiality: Yes    The following statements were read to the patient and/or legal guardian that are established with the [facility] Provider.  "The purpose of this phone visit is to provide behavioral health care while limiting exposure to the coronavirus (COVID19).  There is a possibility of technology failure and discussed alternative modes of communication if that failure occurs."  "By engaging in this telephone visit, you consent to the provision of healthcare.  Additionally, you authorize for your insurance to be billed for the services provided during this telephone visit."   Patient and/or legal guardian consented to telephone visit: Yes   PRESENTING CONCERNS: Patient and/or family reports the following symptoms/concerns: Pt reports that he has completed virtual assessment with psychiatrist to begin services. Pt's medications have been adjusted and his next scheduled appointment is August 10, 2019 Duration of problem: na; Severity of problem: moderate  STRENGTHS (Protective Factors/Coping Skills): Pt has established medication management Pt receives strong support from famiy and friends  GOALS ADDRESSED: Patient will: 1.  Reduce symptoms of: anxiety and depression  2.  Increase knowledge and/or ability of: self-management skills  3.  Demonstrate ability to: Increase adequate support systems for  patient/family  INTERVENTIONS: Interventions utilized:  Solution-Focused Strategies Standardized Assessments completed: Not Needed  ASSESSMENT: Patient currently experiencing depression and anxiety.    Patient may benefit from psychotherapy. He has established care with psychiatrist through [place] with Dr. Wendy Poet  PLAN: 1. Follow up with behavioral health clinician on : Contact LCSW with any additional concerns or resource needs 2. Behavioral recommendations: Comply with medication management 3. Referral(s): [place] (In Clinic)  Rebekah Chesterfield, [place] 07/15/2019 5:38 PM

## 2019-07-21 DIAGNOSIS — F3132 Bipolar disorder, current episode depressed, moderate: Secondary | ICD-10-CM | POA: Diagnosis not present

## 2019-07-21 DIAGNOSIS — F4312 Post-traumatic stress disorder, chronic: Secondary | ICD-10-CM | POA: Diagnosis not present

## 2019-07-21 DIAGNOSIS — F411 Generalized anxiety disorder: Secondary | ICD-10-CM | POA: Diagnosis not present

## 2019-07-25 ENCOUNTER — Encounter: Payer: Self-pay | Admitting: Family Medicine

## 2019-07-25 ENCOUNTER — Ambulatory Visit
Admission: EM | Admit: 2019-07-25 | Discharge: 2019-07-25 | Disposition: A | Discharge status: Home / Self Care | Payer: Medicare Other | Attending: Family Medicine | Admitting: Family Medicine

## 2019-07-25 DIAGNOSIS — M545 Low back pain, unspecified: Secondary | ICD-10-CM

## 2019-07-25 MED ORDER — PREDNISONE 10 MG PO TABS: ORAL_TABLET | ORAL | 0 refills | Status: DC

## 2019-07-25 MED ORDER — TRAMADOL HCL 50 MG PO TABS: 50.0000 mg | ORAL_TABLET | Freq: Four times a day (QID) | ORAL | 0 refills | Status: DC | PRN

## 2019-07-25 NOTE — ED Triage Notes (Signed)
Pt states stepped wrong on Saturday night and felt a pop in his lower back. States again yesterday bringing in groceries and felt another pop in back. Pt c/o lower back pain radiating around to ribs.

## 2019-07-25 NOTE — ED Provider Notes (Signed)
[facility]    CSN: [id number] Arrival date & time: 07/25/19  1628      History   Chief Complaint Chief Complaint  Patient presents with  . Back Pain    HPI Bradley [name] is a 38 y.o. male history of DM type II, bipolar disorder, hypertension, obesity, peripheral neuropathy, previous PE on warfarin presenting today for evaluation of back pain.  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  Yesterday he was helping bring in groceries and felt another pop in his back which significantly worsened his pain.  Pain is bilateral and radiates into her ribs.  Notes the pain is his mid and lower back.  History of similar in the past.  Typically responds to prednisone.  His last A1c was 13.3 which is up 2 points from his visit in May.  He states that he went through a period of time where he was not on his insulin due to psych issues.  He states that this is the reason he has seen an increase in his A1c.  States that he regularly monitors his blood sugars and is back on his insulin as prescribed.  He notes his last INR was 2.0 approximately 1 week ago.  Has tramadol prescribed to him for chronic pain related to his legs.  [facility]  He has recently been working at a haunted house working as an Immunologist and has been on his feet more which has caused him to have more leg pain and has used his tramadol more recently.  States that he has 2 doses left.  HPI  Past Medical History:  Diagnosis Date  . ADHD   . ADHD (attention deficit hyperactivity disorder)   . Bipolar 1 disorder ([place])   . Bipolar disorder ([place])   . Diabetes mellitus without complication ([place])   . Morbid obesity ([place])   . Obesity   . Panic attack   . Varicose veins of both lower extremities with pain     Patient Active Problem List   Diagnosis Date Noted  . Pulmonary emboli  ([place]) 11/27/2018  . Thrombocytopenia ([place]) 11/27/2018  . Acute pulmonary embolism without acute cor pulmonale, unspecified pulmonary embolism type ([place]) 11/27/2018  . Splenomegaly 11/27/2018  . Focal nodular hyperplasia of liver 11/27/2018  . Diabetic neuropathy ([place]) 05/12/2018  . Chronic pain syndrome 05/20/2017  . Bipolar disorder, current episode mixed, moderate ([place]) 03/04/2017  . Varicose veins of both lower extremities with pain 02/18/2017  . Chronic deep vein thrombosis (DVT) of proximal vein of left lower extremity ([place]) 11/12/2016  . Smoking addiction 06/19/2016  . HTN (hypertension) 07/27/2013  . Morbid obesity ([place]) 07/27/2013  . Bipolar 1 disorder, mixed, moderate ([place]) 07/27/2013  . DM2 (diabetes mellitus, type 2) ([place]) 04/26/2013    Past Surgical History:  Procedure Laterality Date  . surgery on meatus as a child         Home Medications    Prior to Admission medications   Medication Sig Start Date End Date Taking? Authorizing Provider  albuterol (VENTOLIN HFA) 108 (90 Base) MCG/ACT inhaler Inhale 1-2 puffs into the lungs every 6 (six) hours as needed for wheezing or shortness of breath. 05/25/19   Charlott Rakes, MD  Blood Glucose Monitoring Suppl (ONETOUCH VERIO) w/Device KIT Use to check blood sugar up to 3 times daily (E11.69, Z79.4) 02/24/19  Charlott Rakes, MD  DULoxetine (CYMBALTA) 60 MG capsule Take 1 capsule (60 mg total) by mouth daily. 06/22/19   Charlott Rakes, MD  glipiZIDE (GLUCOTROL) 10 MG tablet Take 1 tablet (10 mg total) by mouth 2 (two) times daily before a meal. 06/22/19   Charlott Rakes, MD  glucose blood (ONETOUCH VERIO) test strip Use to check blood sugar up to 3 times daily (E11.69, Z79.4) 02/24/19   Charlott Rakes, MD  Insulin Glargine (BASAGLAR KWIKPEN) 100 UNIT/ML SOPN Inject 0.25 mLs (25 Units total) into the skin at bedtime. 06/22/19   Charlott Rakes, MD  Insulin Pen Needle (TRUEPLUS PEN NEEDLES) 32G X 4 MM MISC Use as directed to  administer victoza 08/16/18   Newlin, Charlane Ferretti, MD  Menthol (ICY HOT ADVANCED RELIEF) 7.5 % PTCH Apply 1 patch topically as needed (pain).    [provider]  metFORMIN (GLUCOPHAGE) 500 MG tablet Take 2 tablets (1,000 mg total) by mouth 2 (two) times daily with a meal. 06/22/19   Charlott Rakes, MD  OneTouch Delica Lancets 79G MISC Use to check blood sugar up to 3 times daily (E11.69, Z79.4) 02/24/19   Charlott Rakes, MD  predniSONE (DELTASONE) 10 MG tablet Please begin with 6 tabs and decrease by 1 tablet each day until you take 1 Tab on day 6. Take with food. 07/25/19   Kadedra Vanaken C, PA-C  traMADol (ULTRAM) 50 MG tablet Take 1 tablet (50 mg total) by mouth every 12 (twelve) hours as needed (for pain). 06/22/19   Charlott Rakes, MD  traMADol (ULTRAM) 50 MG tablet Take 1 tablet (50 mg total) by mouth every 6 (six) hours as needed for severe pain. 07/25/19   Ruhi Kopke C, PA-C  warfarin (COUMADIN) 2.5 MG tablet Take as directed by the Coumadin Clinic. 05/25/19   Charlott Rakes, MD    Family History Family History  Problem Relation Age of Onset  . Rheum arthritis Mother   . Diabetes Mother   . Heart failure Mother   . Stroke Mother     Social History Social History   Tobacco Use  . Smoking status: Current Every Day Smoker    Packs/day: 1.00    Types: Cigarettes  . Smokeless tobacco: Never Used  Substance Use Topics  . Alcohol use: Yes    Comment: Rarely  . Drug use: No     Allergies   Gabapentin and Lyrica [pregabalin]   Review of Systems Review of Systems  Constitutional: Negative for fatigue and fever.  Eyes: Negative for redness, itching and visual disturbance.  Respiratory: Negative for shortness of breath.   Cardiovascular: Negative for chest pain and leg swelling.  Gastrointestinal: Negative for nausea and vomiting.  Musculoskeletal: Positive for back pain and myalgias. Negative for arthralgias, neck pain and neck stiffness.  Skin: Negative for  color change, rash and wound.  Neurological: Negative for dizziness, syncope, weakness, light-headedness and headaches.     Physical Exam Triage Vital Signs ED Triage Vitals [07/25/19 1640]  Enc Vitals Group     BP 123/82     Pulse Rate 86     Resp 20     Temp 97.9 F (36.6 C)     Temp Source Oral     SpO2 96 %     Weight      Height      Head Circumference      Peak Flow      Pain Score 6     Pain Loc  Pain Edu?      Excl. in [place]?    No data found.  Updated Vital Signs BP 123/82 (BP Location: Left Arm)   Pulse 86   Temp 97.9 F (36.6 C) (Oral)   Resp 20   SpO2 96%   Visual Acuity Right Eye Distance:   Left Eye Distance:   Bilateral Distance:    Right Eye Near:   Left Eye Near:    Bilateral Near:     Physical Exam Vitals signs and nursing note reviewed.  Constitutional:      Appearance: He is well-developed.  HENT:     Head: Normocephalic and atraumatic.  Eyes:     Conjunctiva/sclera: Conjunctivae normal.  Neck:     Musculoskeletal: Neck supple.  Cardiovascular:     Rate and Rhythm: Normal rate and regular rhythm.     Heart sounds: No murmur.  Pulmonary:     Effort: Pulmonary effort is normal. No respiratory distress.     Breath sounds: Normal breath sounds.     Comments: Breathing comfortably at rest, CTABL, no wheezing, rales or other adventitious sounds auscultated Abdominal:     Palpations: Abdomen is soft.     Tenderness: There is no abdominal tenderness.  Musculoskeletal:     Comments: Nontender to palpation of cervical spine midline and upper thoracic spine midline, lower thoracic and lumbar spine diffusely tender throughout, no focal tenderness, no palpable deformity or step-off, increased tenderness throughout bilateral lower thoracic and lumbar musculature  Strength 5/5 and equal bilaterally at hips and knees, patellar reflex difficult to obtain bilaterally Grip strength 5/5 and equal bilaterally  Skin:    General: Skin is warm and  dry.  Neurological:     Mental Status: He is alert.      UC Treatments / Results  Labs (all labs ordered are listed, but only abnormal results are displayed) Labs Reviewed - No data to display  EKG   Radiology No results found.  Procedures Procedures (including critical care time)  Medications Ordered in UC Medications - No data to display  Initial Impression / Assessment and Plan / UC Course  I have reviewed the triage vital signs and the nursing notes.  Pertinent labs & imaging results that were available during my care of the patient were reviewed by me and considered in my medical decision making (see chart for details).     Patient likely with muscle strain given mechanism of injury, do not suspect acute bony abnormality at this time.  No neuro deficits, no red flags for cauda equina.  Discussed concern with prescribing prednisone given recent increase in A1c.  Patient feels based off his previous tolerance of prednisone but this would not affect his sugars much.  Stressed importance of strictly adhering to his insulin while on this and taking as prescribed and close monitoring of his sugars.  Provided 3 days worth of tramadol, but advised if he needs any further refills of this he should be seen by his primary care who normally provides monthly supplies of this.Discussed strict return precautions. Patient verbalized understanding and is agreeable with plan.    Final Clinical Impressions(s) / UC Diagnoses   Final diagnoses:  Acute bilateral low back pain without sciatica     Discharge Instructions     Prednisone taper today beginning with 6 tablets / 60 mg, decrease by 1 tablet / 10 mg daily until you take 1 tablet on day 6, please take with food and in the morning if you  are able -Please monitor your blood sugars while you are on this.  Please take insulin accordingly. I provided you with 3 days worth of tramadol, use sparingly and only for severe pain, please  follow-up with your primary care for further refills of this medicine. Continue to move around, avoid heavy lifting    ED Prescriptions    Medication Sig Dispense Auth. Provider   traMADol (ULTRAM) 50 MG tablet Take 1 tablet (50 mg total) by mouth every 6 (six) hours as needed for severe pain. 12 tablet Denham Mose C, PA-C   predniSONE (DELTASONE) 10 MG tablet Please begin with 6 tabs and decrease by 1 tablet each day until you take 1 Tab on day 6. Take with food. 21 tablet Reathel Turi, [place] C, PA-C     I have reviewed the PDMP during this encounter.   Janith Lima, [place] 07/25/19 [phone]

## 2019-07-25 NOTE — Discharge Instructions (Signed)
Prednisone taper today beginning with 6 tablets / 60 mg, decrease by 1 tablet / 10 mg daily until you take 1 tablet on day 6, please take with food and in the morning if you are able -Please monitor your blood sugars while you are on this.  Please take insulin accordingly. I provided you with 3 days worth of tramadol, use sparingly and only for severe pain, please follow-up with your primary care for further refills of this medicine. Continue to move around, avoid heavy lifting

## 2019-07-27 ENCOUNTER — Ambulatory Visit: Payer: Medicare Other | Attending: Family Medicine | Admitting: Licensed Clinical Social Worker

## 2019-07-27 ENCOUNTER — Other Ambulatory Visit: Payer: Self-pay

## 2019-07-27 DIAGNOSIS — F439 Reaction to severe stress, unspecified: Secondary | ICD-10-CM | POA: Diagnosis not present

## 2019-07-28 NOTE — BH Specialist Note (Signed)
Integrated Behavioral Health Visit via Telemedicine (Telephone)  07/28/2019 Bradley [name] [id number]   Session Start time: 3:40 PM  Session End time: 3:50 PM Total time: 10 minutes  Referring Provider: Dr. Margarita Rana Type of Visit: Telephonic Patient location: [facility] Provider location: Office All persons participating in visit: LCSW and pt  Confirmed patient's address: Yes  Confirmed patient's phone number: Yes  Any changes to demographics: No   Confirmed patient's insurance: Yes  Any changes to patient's insurance: No   Discussed confidentiality: Yes    The following statements were read to the patient and/or legal guardian that are established with the [facility] Provider.  "The purpose of this phone visit is to provide behavioral health care while limiting exposure to the coronavirus (COVID19).  There is a possibility of technology failure and discussed alternative modes of communication if that failure occurs."  "By engaging in this telephone visit, you consent to the provision of healthcare.  Additionally, you authorize for your insurance to be billed for the services provided during this telephone visit."   Patient and/or legal guardian consented to telephone visit: Yes   PRESENTING CONCERNS: Patient and/or family reports the following symptoms/concerns: Pt reports that he is doing well. States that his medication (Cymbalta) was increased by psychiatrist which has decreased pain. Pt completed assessment with therapist, [place], on 07/21/2019. Next appt scheduled for 08/04/2019. Duration of problem: na; Severity of problem: moderate  STRENGTHS (Protective Factors/Coping Skills): Pt has established psychotherapy Pt receives strong support system  GOALS ADDRESSED: Patient will: 1.  Reduce symptoms of: anxiety and depression  2.  Increase knowledge and/or ability of: self-management skills  3.  Demonstrate ability to: Increase healthy adjustment to current  life circumstances and Increase adequate support systems for patient/family  INTERVENTIONS: Interventions utilized:  Supportive Counseling Standardized Assessments completed: Not Needed  ASSESSMENT: Patient currently experiencing depression and anxiety. Reports decrease in symptoms.   Patient has established care with psychiatrist through [place] with Dr. Wendy Poet and psychotherapist [place]. Next scheduled appt on 11/05. LCSW emailed pt flyer on [facility] event.   PLAN: 1. Follow up with behavioral health clinician on : Continue behavioral health services through [facility]. Contact LCSW with any additional resource needs 2. Behavioral recommendations: Comply with medication management and psychotherapy 3. Referral(s): [place] (In Clinic)  Rebekah [place], [place] 07/29/2019 5:11 PM

## 2019-07-29 MED FILL — DULoxetine HCL 60 MG CPEP: 60 | 30 days supply | Qty: 30 | Fill #1

## 2019-08-01 MED FILL — WARFARIN NA 2.5 MG TAB: 2.5 | 15 days supply | Qty: 30 | Fill #2

## 2019-08-01 MED FILL — VENTOLIN HFA 90 MCG INHALER: 108 (90 BAS | 25 days supply | Qty: 18 | Fill #1

## 2019-08-03 DIAGNOSIS — F411 Generalized anxiety disorder: Secondary | ICD-10-CM | POA: Diagnosis not present

## 2019-08-03 DIAGNOSIS — F4312 Post-traumatic stress disorder, chronic: Secondary | ICD-10-CM | POA: Diagnosis not present

## 2019-08-03 DIAGNOSIS — F3132 Bipolar disorder, current episode depressed, moderate: Secondary | ICD-10-CM | POA: Diagnosis not present

## 2019-08-03 MED FILL — ARIPiprazole 5 MG TABS: 5 | 30 days supply | Qty: 30 | Fill #0

## 2019-08-03 MED FILL — DULoxetine HCL 30 MG CPEP: 30 | 30 days supply | Qty: 30 | Fill #0

## 2019-08-03 MED FILL — traZODone HCL 50 MG TABS: 50 | 30 days supply | Qty: 60 | Fill #0

## 2019-08-04 DIAGNOSIS — F411 Generalized anxiety disorder: Secondary | ICD-10-CM | POA: Diagnosis not present

## 2019-08-04 DIAGNOSIS — F4312 Post-traumatic stress disorder, chronic: Secondary | ICD-10-CM | POA: Diagnosis not present

## 2019-08-04 DIAGNOSIS — F3132 Bipolar disorder, current episode depressed, moderate: Secondary | ICD-10-CM | POA: Diagnosis not present

## 2019-08-04 MED FILL — traMADol HCL 50 MG TABS: 50 | 30 days supply | Qty: 60 | Fill #0

## 2019-08-09 MED FILL — DOXEPIN 25 MG CAPSULE: 25 | 30 days supply | Qty: 30 | Fill #0

## 2019-08-11 ENCOUNTER — Other Ambulatory Visit: Payer: Self-pay

## 2019-08-11 ENCOUNTER — Ambulatory Visit: Payer: Medicare Other | Attending: Family Medicine | Admitting: Pharmacist

## 2019-08-11 DIAGNOSIS — I825Y2 Chronic embolism and thrombosis of unspecified deep veins of left proximal lower extremity: Secondary | ICD-10-CM

## 2019-08-11 DIAGNOSIS — I2699 Other pulmonary embolism without acute cor pulmonale: Secondary | ICD-10-CM | POA: Insufficient documentation

## 2019-08-11 LAB — POCT INR: INR: 1.8 — AB (ref 2.0–3.0)

## 2019-08-11 MED FILL — metFORMIN HCL 500 MG TABS: 500 | 30 days supply | Qty: 120 | Fill #1

## 2019-08-11 MED FILL — glipiZIDE 10 MG TABS: 10 | 30 days supply | Qty: 60 | Fill #1

## 2019-08-18 DIAGNOSIS — F4312 Post-traumatic stress disorder, chronic: Secondary | ICD-10-CM | POA: Diagnosis not present

## 2019-08-18 DIAGNOSIS — F411 Generalized anxiety disorder: Secondary | ICD-10-CM | POA: Diagnosis not present

## 2019-08-18 DIAGNOSIS — F3132 Bipolar disorder, current episode depressed, moderate: Secondary | ICD-10-CM | POA: Diagnosis not present

## 2019-08-24 ENCOUNTER — Ambulatory Visit: Payer: Medicare Other | Attending: Family Medicine | Admitting: Pharmacist

## 2019-08-24 ENCOUNTER — Other Ambulatory Visit: Payer: Self-pay

## 2019-08-24 DIAGNOSIS — I825Y2 Chronic embolism and thrombosis of unspecified deep veins of left proximal lower extremity: Secondary | ICD-10-CM | POA: Diagnosis not present

## 2019-08-24 DIAGNOSIS — I2699 Other pulmonary embolism without acute cor pulmonale: Secondary | ICD-10-CM | POA: Insufficient documentation

## 2019-08-24 LAB — POCT INR: INR: 1.7 — AB (ref 2.0–3.0)

## 2019-08-31 DIAGNOSIS — F3132 Bipolar disorder, current episode depressed, moderate: Secondary | ICD-10-CM | POA: Diagnosis not present

## 2019-08-31 DIAGNOSIS — F411 Generalized anxiety disorder: Secondary | ICD-10-CM | POA: Diagnosis not present

## 2019-08-31 DIAGNOSIS — F4312 Post-traumatic stress disorder, chronic: Secondary | ICD-10-CM | POA: Diagnosis not present

## 2019-08-31 MED FILL — BASAGLAR 100 UNIT/ML KWIKPE: 100 | 24 days supply | Qty: 6 | Fill #1

## 2019-08-31 MED FILL — ARIPiprazole 5 MG TABS: 5 | 30 days supply | Qty: 30 | Fill #0

## 2019-08-31 MED FILL — DULoxetine HCL 60 MG CPEP: 60 | 30 days supply | Qty: 30 | Fill #2

## 2019-08-31 MED FILL — WARFARIN NA 2.5 MG TAB: 2.5 | 30 days supply | Qty: 30 | Fill #2

## 2019-09-01 MED FILL — traMADol HCL 50 MG TABS: 50 | 30 days supply | Qty: 60 | Fill #1

## 2019-09-07 ENCOUNTER — Ambulatory Visit: Payer: Medicare Other | Attending: Family Medicine | Admitting: Pharmacist

## 2019-09-07 ENCOUNTER — Other Ambulatory Visit: Payer: Self-pay

## 2019-09-07 DIAGNOSIS — I825Y2 Chronic embolism and thrombosis of unspecified deep veins of left proximal lower extremity: Secondary | ICD-10-CM | POA: Diagnosis not present

## 2019-09-07 DIAGNOSIS — I2699 Other pulmonary embolism without acute cor pulmonale: Secondary | ICD-10-CM | POA: Diagnosis not present

## 2019-09-07 DIAGNOSIS — F3132 Bipolar disorder, current episode depressed, moderate: Secondary | ICD-10-CM | POA: Diagnosis not present

## 2019-09-07 DIAGNOSIS — Z7901 Long term (current) use of anticoagulants: Secondary | ICD-10-CM | POA: Insufficient documentation

## 2019-09-07 DIAGNOSIS — F411 Generalized anxiety disorder: Secondary | ICD-10-CM | POA: Diagnosis not present

## 2019-09-07 DIAGNOSIS — F4312 Post-traumatic stress disorder, chronic: Secondary | ICD-10-CM | POA: Diagnosis not present

## 2019-09-07 LAB — POCT INR: INR: 2 (ref 2.0–3.0)

## 2019-09-08 MED FILL — DULoxetine HCL 30 MG CPEP: 30 | 30 days supply | Qty: 30 | Fill #1

## 2019-09-27 ENCOUNTER — Ambulatory Visit: Payer: Medicare Other | Attending: Family Medicine | Admitting: Family Medicine

## 2019-09-27 ENCOUNTER — Encounter: Payer: Self-pay | Admitting: Family Medicine

## 2019-09-27 ENCOUNTER — Other Ambulatory Visit: Payer: Self-pay

## 2019-09-27 VITALS — BP 121/78 | HR 95 | Temp 98.2°F | Ht 72.0 in | Wt 337.0 lb

## 2019-09-27 DIAGNOSIS — Z79899 Other long term (current) drug therapy: Secondary | ICD-10-CM | POA: Diagnosis not present

## 2019-09-27 DIAGNOSIS — E119 Type 2 diabetes mellitus without complications: Secondary | ICD-10-CM | POA: Diagnosis present

## 2019-09-27 DIAGNOSIS — Z86718 Personal history of other venous thrombosis and embolism: Secondary | ICD-10-CM | POA: Insufficient documentation

## 2019-09-27 DIAGNOSIS — Z7901 Long term (current) use of anticoagulants: Secondary | ICD-10-CM | POA: Diagnosis not present

## 2019-09-27 DIAGNOSIS — E1149 Type 2 diabetes mellitus with other diabetic neurological complication: Secondary | ICD-10-CM

## 2019-09-27 DIAGNOSIS — Z888 Allergy status to other drugs, medicaments and biological substances status: Secondary | ICD-10-CM | POA: Diagnosis not present

## 2019-09-27 DIAGNOSIS — Z8261 Family history of arthritis: Secondary | ICD-10-CM | POA: Diagnosis not present

## 2019-09-27 DIAGNOSIS — E1140 Type 2 diabetes mellitus with diabetic neuropathy, unspecified: Secondary | ICD-10-CM | POA: Insufficient documentation

## 2019-09-27 DIAGNOSIS — Z9119 Patient's noncompliance with other medical treatment and regimen: Secondary | ICD-10-CM | POA: Diagnosis not present

## 2019-09-27 DIAGNOSIS — Z91199 Patient's noncompliance with other medical treatment and regimen due to unspecified reason: Secondary | ICD-10-CM

## 2019-09-27 DIAGNOSIS — E1165 Type 2 diabetes mellitus with hyperglycemia: Secondary | ICD-10-CM

## 2019-09-27 DIAGNOSIS — Z6841 Body Mass Index (BMI) 40.0 and over, adult: Secondary | ICD-10-CM | POA: Insufficient documentation

## 2019-09-27 DIAGNOSIS — Z86711 Personal history of pulmonary embolism: Secondary | ICD-10-CM | POA: Insufficient documentation

## 2019-09-27 DIAGNOSIS — I825Y2 Chronic embolism and thrombosis of unspecified deep veins of left proximal lower extremity: Secondary | ICD-10-CM | POA: Diagnosis not present

## 2019-09-27 DIAGNOSIS — Z823 Family history of stroke: Secondary | ICD-10-CM | POA: Diagnosis not present

## 2019-09-27 DIAGNOSIS — F3162 Bipolar disorder, current episode mixed, moderate: Secondary | ICD-10-CM | POA: Diagnosis not present

## 2019-09-27 DIAGNOSIS — Z8249 Family history of ischemic heart disease and other diseases of the circulatory system: Secondary | ICD-10-CM | POA: Insufficient documentation

## 2019-09-27 DIAGNOSIS — Z794 Long term (current) use of insulin: Secondary | ICD-10-CM | POA: Insufficient documentation

## 2019-09-27 DIAGNOSIS — Z9114 Patient's other noncompliance with medication regimen: Secondary | ICD-10-CM | POA: Insufficient documentation

## 2019-09-27 DIAGNOSIS — Z833 Family history of diabetes mellitus: Secondary | ICD-10-CM | POA: Diagnosis not present

## 2019-09-27 LAB — POCT GLYCOSYLATED HEMOGLOBIN (HGB A1C): HbA1c POC (<> result, manual entry): >15 % (ref 4.0–5.6)

## 2019-09-27 LAB — GLUCOSE, POCT (MANUAL RESULT ENTRY)
POC Glucose: 475 mg/dl — AB (ref 70–99)
POC Glucose: 394 mg/dl — AB (ref 70–99)

## 2019-09-27 MED ORDER — INSULIN ASPART 100 UNIT/ML ~~LOC~~ SOLN
20.0000 [IU] | Freq: Once | SUBCUTANEOUS | Status: AC
  Administered 2019-09-27: 20 [IU] via SUBCUTANEOUS

## 2019-09-27 MED ORDER — METFORMIN HCL 500 MG PO TABS: 1000.0000 mg | ORAL_TABLET | Freq: Two times a day (BID) | ORAL | 3 refills | Status: DC

## 2019-09-27 MED ORDER — GLIPIZIDE 10 MG PO TABS: 10.0000 mg | ORAL_TABLET | Freq: Two times a day (BID) | ORAL | 3 refills | Status: DC

## 2019-09-27 MED ORDER — BASAGLAR KWIKPEN 100 UNIT/ML ~~LOC~~ SOPN: 25.0000 [IU] | PEN_INJECTOR | Freq: Every day | SUBCUTANEOUS | 3 refills | Status: DC

## 2019-09-27 MED FILL — BASAGLAR 100 UNIT/ML KWIKPE: 100 | 36 days supply | Qty: 9 | Fill #0

## 2019-09-27 MED FILL — metFORMIN HCL 500 MG TABS: 500 | 30 days supply | Qty: 120 | Fill #0

## 2019-09-27 MED FILL — glipiZIDE 10 MG TABS: 10 | 30 days supply | Qty: 60 | Fill #0

## 2019-09-27 NOTE — Progress Notes (Signed)
Patient is having pain in his legs from knees down.

## 2019-09-27 NOTE — Patient Instructions (Addendum)
Text COVID to [phone] for an appointment for COVID testing  From [facility]: INR was a little low today. Start taking 1.5 tablets on Wednesdays with 1 tablet all other days. I will recheck your INR in 2 weeks.

## 2019-09-27 NOTE — Progress Notes (Signed)
Subjective:  Patient ID: Bradley [name], male    DOB: 1980/11/19  Age: 38 y.o. MRN: [id number]  CC: Diabetes   HPI Bradley [name] is a 38 year old male with a history of morbid obesity, type 2 diabetes mellitus (A1c>15 ), diabetic neuropathy, Bipolar disorder, previous DVT in 2017, PE (diagnosed in 10/2018 - on chronic anticoagulation with Coumadin). He has difficulty administering injectables and as he has not been compliant with his Lantus; his girlfriend is also squirmish about giving him insulin. His A1c is greater than 15 today and blood sugar is 475; 2 hours ago he had a couple of cookies.  He has been compliant with glipizide and Metformin.  NovoLog 20 units administered in the clinic.   He goes to [employer] and he is on Abilify and "one other sleep medicine" States psychologically he is doing well, seeing a therapist and he did feel he was previously in a bad state of mind which might have affected compliance with his diabetic regimen. His neuropathy has been bothersome and he is currently on tramadol for this and Cymbalta as well; wondering if the dose of tramadol can be increased.  He previously had side effects with gabapentin and Lyrica and could not tolerate them. He is wondering where he can get COVID test - his friend's Dad was informed a colleague tested positive and his friend just got tested and is awaiting results.  Past Medical History:  Diagnosis Date  . ADHD   . ADHD (attention deficit hyperactivity disorder)   . Bipolar 1 disorder ([place])   . Bipolar disorder ([place])   . Diabetes mellitus without complication ([place])   . Morbid obesity ([place])   . Obesity   . Panic attack   . Varicose veins of both lower extremities with pain     Past Surgical History:  Procedure Laterality Date  . surgery on meatus as a child      Family History  Problem Relation Age of Onset  . Rheum arthritis Mother   . Diabetes Mother   . Heart failure Mother   . Stroke  Mother     Allergies  Allergen Reactions  . Gabapentin Other (See Comments)    Crying spells  . Lyrica [Pregabalin] Other (See Comments)    Makes the patient somnolent    Outpatient Medications Prior to Visit  Medication Sig Dispense Refill  . albuterol (VENTOLIN HFA) 108 (90 Base) MCG/ACT inhaler Inhale 1-2 puffs into the lungs every 6 (six) hours as needed for wheezing or shortness of breath. 18 g 2  . Blood Glucose Monitoring Suppl (ONETOUCH VERIO) w/Device KIT Use to check blood sugar up to 3 times daily (E11.69, Z79.4) 1 kit 0  . DULoxetine (CYMBALTA) 60 MG capsule Take 1 capsule (60 mg total) by mouth daily. 30 capsule 3  . glipiZIDE (GLUCOTROL) 10 MG tablet Take 1 tablet (10 mg total) by mouth 2 (two) times daily before a meal. 60 tablet 3  . glucose blood (ONETOUCH VERIO) test strip Use to check blood sugar up to 3 times daily (E11.69, Z79.4) 100 each 11  . Insulin Glargine (BASAGLAR KWIKPEN) 100 UNIT/ML SOPN Inject 0.25 mLs (25 Units total) into the skin at bedtime. 30 mL 3  . Insulin Pen Needle (TRUEPLUS PEN NEEDLES) 32G X 4 MM MISC Use as directed to administer victoza 100 each 3  . Menthol (ICY HOT ADVANCED RELIEF) 7.5 % PTCH Apply 1 patch topically as needed (pain).    . metFORMIN (GLUCOPHAGE) 500  MG tablet Take 2 tablets (1,000 mg total) by mouth 2 (two) times daily with a meal. 120 tablet 3  . OneTouch Delica Lancets 35W MISC Use to check blood sugar up to 3 times daily (E11.69, Z79.4) 100 each 11  . traMADol (ULTRAM) 50 MG tablet Take 1 tablet (50 mg total) by mouth every 12 (twelve) hours as needed (for pain). 60 tablet 2  . traMADol (ULTRAM) 50 MG tablet Take 1 tablet (50 mg total) by mouth every 6 (six) hours as needed for severe pain. 12 tablet 0  . warfarin (COUMADIN) 2.5 MG tablet Take as directed by the Coumadin Clinic. 30 tablet 2  . predniSONE (DELTASONE) 10 MG tablet Please begin with 6 tabs and decrease by 1 tablet each day until you take 1 Tab on day 6. Take with  food. (Patient not taking: Reported on 09/27/2019) 21 tablet 0   No facility-administered medications prior to visit.     ROS Review of Systems  Constitutional: Negative for activity change and appetite change.  HENT: Negative for sinus pressure and sore throat.   Eyes: Negative for visual disturbance.  Respiratory: Negative for cough, chest tightness and shortness of breath.   Cardiovascular: Negative for chest pain and leg swelling.  Gastrointestinal: Negative for abdominal distention, abdominal pain, constipation and diarrhea.  Endocrine: Negative.   Genitourinary: Negative for dysuria.  Musculoskeletal: Negative for joint swelling and myalgias.  Skin: Negative for rash.  Allergic/Immunologic: Negative.   Neurological: Positive for numbness. Negative for weakness and light-headedness.  Psychiatric/Behavioral: Negative for dysphoric mood and suicidal ideas.    Objective:  BP 121/78   Pulse 95   Temp 98.2 F (36.8 C) (Oral)   Ht 6' (1.829 m)   Wt (!) 337 lb (152.9 kg)   SpO2 100%   BMI 45.71 kg/m   BP/Weight 09/27/2019 07/25/2019 6/56/8127  Systolic BP 517 001 749  Diastolic BP 78 82 80  Wt. (Lbs) 337 - 353  BMI 45.71 - 47.88  Some encounter information is confidential and restricted. Go to Review Flowsheets activity to see all data.      Physical Exam Constitutional:      Appearance: He is well-developed.  Neck:     Vascular: No JVD.  Cardiovascular:     Rate and Rhythm: Normal rate.     Heart sounds: Normal heart sounds. No murmur.  Pulmonary:     Effort: Pulmonary effort is normal.     Breath sounds: Normal breath sounds. No wheezing or rales.  Chest:     Chest wall: No tenderness.  Abdominal:     General: Bowel sounds are normal. There is no distension.     Palpations: Abdomen is soft. There is no mass.     Tenderness: There is no abdominal tenderness.  Musculoskeletal:        General: Normal range of motion.     Right lower leg: No edema.     Left  lower leg: No edema.  Skin:    Comments: Erythema of left shin with scabbing which is residual from previous cellulitis  Neurological:     Mental Status: He is alert and oriented to person, place, and time.  Psychiatric:        Mood and Affect: Mood normal.     CMP Latest Ref Rng & Units 04/14/2019 11/26/2018 11/19/2018  Glucose 70 - 99 mg/dL 374(H) 183(H) 249(H)  BUN 6 - 20 mg/dL 6 <5(L) 8  Creatinine 0.61 - 1.24 mg/dL 0.78 0.76 0.70(L)  Sodium 135 - 145 mmol/L 134(L) 138 139  Potassium 3.5 - 5.1 mmol/L 4.2 4.2 4.8  Chloride 98 - 111 mmol/L 101 103 101  CO2 22 - 32 mmol/L _0 Calcium 8.9 - 10.3 mg/dL 8.5(L) 9.3 9.0  Total Protein 6.5 - 8.1 g/dL 6.3(L) 6.8 6.9  Total Bilirubin 0.3 - 1.2 mg/dL 0.6 0.9 1.1  Alkaline Phos 38 - 126 U/L 94 65 74  AST 15 - 41 U/L 33 21 23  ALT 0 - 44 U/L 43 27 40    Lipid Panel     Component Value Date/Time   CHOL 132 11/19/2018 1441   TRIG 115 11/19/2018 1441   HDL 37 (L) 11/19/2018 1441   CHOLHDL 3.6 11/19/2018 1441   CHOLHDL 4.0 11/12/2016 1613   VLDL 28 11/12/2016 1613   LDLCALC 72 11/19/2018 1441    CBC    Component Value Date/Time   WBC 5.9 04/14/2019 1709   RBC 5.64 04/14/2019 1709   HGB 15.7 04/14/2019 1709   HGB 16.5 05/20/2017 1549   HCT 47.5 04/14/2019 1709   HCT 48.5 05/20/2017 1549   PLT 88 (L) 04/14/2019 1709   PLT 140 (L) 05/20/2017 1549   MCV 84.2 04/14/2019 1709   MCV 85 05/20/2017 1549   MCH 27.8 04/14/2019 1709   MCHC 33.1 04/14/2019 1709   RDW 13.2 04/14/2019 1709   RDW 14.6 05/20/2017 1549   LYMPHSABS 1.4 04/14/2019 1709   LYMPHSABS 2.5 05/20/2017 1549   MONOABS 0.4 04/14/2019 1709   EOSABS 0.1 04/14/2019 1709   EOSABS 0.2 05/20/2017 1549   BASOSABS 0.0 04/14/2019 1709   BASOSABS 0.0 05/20/2017 1549   Lab Results  Component Value Date   HGBA1C >15 09/27/2019     Assessment & Plan:    1. Type 2 diabetes mellitus with hyperglycemia, with long-term current use of insulin (HCC) Uncontrolled with  A1c of greater than 15 due to noncompliance; A1c goal is less than 7 He does not like administering injectables and I have explained to him that unfortunately injectables cannot be totally eliminated given his uncontrolled diabetes mellitus and once weekly injectables will be insufficient NovoLog 20 units administered due to hyperglycemia 475 and he was observed for 30 minutes and repeat revealed improvement in blood sugars Emphasized the need to be compliant with a diabetic diet which he has not been doing He promises to resume his Lantus - Ambulatory referral to Ophthalmology - glipiZIDE (GLUCOTROL) 10 MG tablet; Take 1 tablet (10 mg total) by mouth 2 (two) times daily before a meal.  Dispense: 60 tablet; Refill: 3 - Insulin Glargine (BASAGLAR KWIKPEN) 100 UNIT/ML SOPN; Inject 0.25 mLs (25 Units total) into the skin at bedtime.  Dispense: 30 mL; Refill: 3 - metFORMIN (GLUCOPHAGE) 500 MG tablet; Take 2 tablets (1,000 mg total) by mouth 2 (two) times daily with a meal.  Dispense: 120 tablet; Refill: 3 - insulin aspart (novoLOG) injection 20 Units  2. Non-compliance We have discussed complications of diabetes and implications of noncompliance with diabetic regimen  3. Other diabetic neurological complication associated with type 2 diabetes mellitus ([place]) Uncontrolled which could be also due to worsening glycemic control Previously unable to tolerate Lyrica and gabapentin Currently on tramadol -PDMP reviewed Explained that unfortunately I am unable to increase his dose of tramadol as this is not a conventional therapy for management of diabetic neuropathy - DULoxetine (CYMBALTA) 60 MG capsule; Take 90 mg by mouth daily.  4. Bipolar disorder, current episode mixed, moderate (  [place]) Stable Managed by psych - DULoxetine (CYMBALTA) 60 MG capsule; Take 90 mg by mouth daily.   5.  Chronic DVT, PE INR 1.8 today; goal is 2-3 Continue anticoagulation with Coumadin -dose adjusted by clinical  pharmacist   Charlott Rakes, MD, FAAFP. [facility] [place], [place]   09/27/2019, 3:45 PM

## 2019-09-28 ENCOUNTER — Encounter: Payer: Self-pay | Admitting: Podiatry

## 2019-09-28 ENCOUNTER — Ambulatory Visit (INDEPENDENT_AMBULATORY_CARE_PROVIDER_SITE_OTHER): Payer: Medicare Other | Admitting: Podiatry

## 2019-09-28 ENCOUNTER — Other Ambulatory Visit: Payer: Self-pay

## 2019-09-28 DIAGNOSIS — I872 Venous insufficiency (chronic) (peripheral): Secondary | ICD-10-CM | POA: Diagnosis not present

## 2019-09-28 DIAGNOSIS — M79674 Pain in right toe(s): Secondary | ICD-10-CM

## 2019-09-28 DIAGNOSIS — B351 Tinea unguium: Secondary | ICD-10-CM | POA: Insufficient documentation

## 2019-09-28 DIAGNOSIS — E119 Type 2 diabetes mellitus without complications: Secondary | ICD-10-CM | POA: Diagnosis not present

## 2019-09-28 DIAGNOSIS — M79675 Pain in left toe(s): Secondary | ICD-10-CM

## 2019-09-28 LAB — POCT INR: INR: 1.8 — AB (ref 2.0–3.0)

## 2019-09-28 MED ORDER — TRAMADOL HCL 50 MG PO TABS: 50.0000 mg | ORAL_TABLET | Freq: Two times a day (BID) | ORAL | 2 refills | Status: DC | PRN

## 2019-09-28 MED FILL — traMADol HCL 50 MG TABS: 50 | 30 days supply | Qty: 60 | Fill #0

## 2019-09-28 NOTE — Addendum Note (Signed)
Addended by: Gomez Cleverly on: 09/28/2019 10:01 AM   Modules accepted: Orders

## 2019-09-28 NOTE — Progress Notes (Signed)
This patient presents to the office with chief complaint of long thick nails and diabetic feet.  This patient  says there  is   pain and discomfort in his  feet due to neuropathy.  This patient says there are long thick painful nails.  These nails are painful walking and wearing shoes. Patient was referred to my office by his medical doctor for evaluation and treatment.  Patient has no history of infection or drainage from both feet.  Patient is unable to  self treat his own nails . This patient presents  to the office today for treatment of the  long nails and a foot evaluation due to history of  Diabetes.  Patient has previous  DVT left leg and takes coumadin.  General Appearance  Alert, conversant and in no acute stress.  Vascular  Dorsalis pedis and posterior tibial  pulses are palpable  bilaterally.  Capillary return is within normal limits  bilaterally. Temperature is within normal limits  Bilaterally.  Severe venous stasis left leg.  Brown discolored left foot.  Neurologic  Senn-Weinstein monofilament wire test within normal limits  bilaterally. Muscle power within normal limits bilaterally.  Nails Thick disfigured discolored nails with subungual debris  from hallux to fifth toes bilaterally. No evidence of bacterial infection or drainage bilaterally.  Orthopedic  No limitations of motion of motion feet .  No crepitus or effusions noted.  No bony pathology or digital deformities noted. Limited ROM left rearfoot.    Skin  normotropic skin with no porokeratosis noted bilaterally.  No signs of infections or ulcers noted.     Onychomycosis  Diabetes with neuropathy.  IE  Debride nails x 10.  A diabetic foot exam was performed and there is no evidence of any vascular or neurologic pathology. Patient says the limited ROM was previously diagnosed by his past doctors who relate the limited motion to the severe swelling left lower leg.    RTC 3 months.   Gardiner Barefoot DPM

## 2019-10-03 ENCOUNTER — Other Ambulatory Visit: Payer: Self-pay | Admitting: Family Medicine

## 2019-10-03 MED FILL — DULoxetine HCL 60 MG CPEP: 60 | 30 days supply | Qty: 30 | Fill #3

## 2019-10-03 MED FILL — DULoxetine HCL 30 MG CPEP: 30 | 30 days supply | Qty: 30 | Fill #0

## 2019-10-04 MED FILL — WARFARIN NA 2.5 MG TAB: 2.5 | 30 days supply | Qty: 30 | Fill #0

## 2019-10-04 MED FILL — ARIPiprazole 5 MG TABS: 5 | 30 days supply | Qty: 30 | Fill #1

## 2019-10-11 ENCOUNTER — Ambulatory Visit: Payer: Medicare Other | Attending: Family Medicine | Admitting: Pharmacist

## 2019-10-11 ENCOUNTER — Other Ambulatory Visit: Payer: Self-pay

## 2019-10-11 DIAGNOSIS — I82402 Acute embolism and thrombosis of unspecified deep veins of left lower extremity: Secondary | ICD-10-CM | POA: Diagnosis not present

## 2019-10-11 DIAGNOSIS — I825Y2 Chronic embolism and thrombosis of unspecified deep veins of left proximal lower extremity: Secondary | ICD-10-CM

## 2019-10-11 DIAGNOSIS — I2699 Other pulmonary embolism without acute cor pulmonale: Secondary | ICD-10-CM | POA: Diagnosis not present

## 2019-10-11 LAB — POCT INR: INR: 1.5 — AB (ref 2.0–3.0)

## 2019-10-13 DIAGNOSIS — F4312 Post-traumatic stress disorder, chronic: Secondary | ICD-10-CM | POA: Diagnosis not present

## 2019-10-13 DIAGNOSIS — F411 Generalized anxiety disorder: Secondary | ICD-10-CM | POA: Diagnosis not present

## 2019-10-13 DIAGNOSIS — F3132 Bipolar disorder, current episode depressed, moderate: Secondary | ICD-10-CM | POA: Diagnosis not present

## 2019-10-18 ENCOUNTER — Ambulatory Visit: Payer: Medicare Other | Attending: Family Medicine | Admitting: Pharmacist

## 2019-10-18 ENCOUNTER — Other Ambulatory Visit: Payer: Self-pay

## 2019-10-18 DIAGNOSIS — I2699 Other pulmonary embolism without acute cor pulmonale: Secondary | ICD-10-CM | POA: Insufficient documentation

## 2019-10-18 DIAGNOSIS — I825Y2 Chronic embolism and thrombosis of unspecified deep veins of left proximal lower extremity: Secondary | ICD-10-CM | POA: Diagnosis not present

## 2019-10-18 LAB — POCT INR: INR: 1.4 — AB (ref 2.0–3.0)

## 2019-10-25 ENCOUNTER — Ambulatory Visit: Payer: Medicare Other | Attending: Family Medicine | Admitting: Pharmacist

## 2019-10-25 ENCOUNTER — Other Ambulatory Visit: Payer: Self-pay

## 2019-10-25 DIAGNOSIS — I825Y2 Chronic embolism and thrombosis of unspecified deep veins of left proximal lower extremity: Secondary | ICD-10-CM | POA: Diagnosis not present

## 2019-10-25 DIAGNOSIS — I2699 Other pulmonary embolism without acute cor pulmonale: Secondary | ICD-10-CM | POA: Diagnosis not present

## 2019-10-25 LAB — POCT INR: INR: 2.1 (ref 2.0–3.0)

## 2019-10-26 MED FILL — traMADol HCL 50 MG TABS: 50 | 30 days supply | Qty: 60 | Fill #1

## 2019-10-27 DIAGNOSIS — F3132 Bipolar disorder, current episode depressed, moderate: Secondary | ICD-10-CM | POA: Diagnosis not present

## 2019-10-27 DIAGNOSIS — F411 Generalized anxiety disorder: Secondary | ICD-10-CM | POA: Diagnosis not present

## 2019-10-27 DIAGNOSIS — F4312 Post-traumatic stress disorder, chronic: Secondary | ICD-10-CM | POA: Diagnosis not present

## 2019-10-27 MED FILL — busPIRone HCL 7.5 MG TABS: 7.5 | 30 days supply | Qty: 60 | Fill #0

## 2019-10-27 MED FILL — DOXEPIN 25 MG CAPSULE: 25 | 30 days supply | Qty: 30 | Fill #0

## 2019-10-27 MED FILL — DULoxetine HCL 30 MG CPEP: 30 | 30 days supply | Qty: 30 | Fill #0

## 2019-10-27 MED FILL — DULoxetine HCL 60 MG CPEP: 60 | 30 days supply | Qty: 30 | Fill #0

## 2019-11-01 MED FILL — WARFARIN NA 2.5 MG TAB: 2.5 | 30 days supply | Qty: 30 | Fill #1

## 2019-11-03 DIAGNOSIS — F411 Generalized anxiety disorder: Secondary | ICD-10-CM | POA: Diagnosis not present

## 2019-11-03 DIAGNOSIS — F4312 Post-traumatic stress disorder, chronic: Secondary | ICD-10-CM | POA: Diagnosis not present

## 2019-11-03 DIAGNOSIS — F3132 Bipolar disorder, current episode depressed, moderate: Secondary | ICD-10-CM | POA: Diagnosis not present

## 2019-11-07 MED FILL — ARIPiprazole 5 MG TABS: 5 | 30 days supply | Qty: 30 | Fill #0

## 2019-11-10 ENCOUNTER — Ambulatory Visit: Payer: Medicare Other | Attending: Family Medicine | Admitting: Pharmacist

## 2019-11-10 ENCOUNTER — Other Ambulatory Visit: Payer: Self-pay

## 2019-11-10 DIAGNOSIS — Z7901 Long term (current) use of anticoagulants: Secondary | ICD-10-CM | POA: Diagnosis not present

## 2019-11-10 DIAGNOSIS — I2699 Other pulmonary embolism without acute cor pulmonale: Secondary | ICD-10-CM | POA: Insufficient documentation

## 2019-11-10 DIAGNOSIS — I825Y2 Chronic embolism and thrombosis of unspecified deep veins of left proximal lower extremity: Secondary | ICD-10-CM | POA: Diagnosis not present

## 2019-11-10 LAB — POCT INR: INR: 1.7 — AB (ref 2.0–3.0)

## 2019-11-18 ENCOUNTER — Ambulatory Visit (INDEPENDENT_AMBULATORY_CARE_PROVIDER_SITE_OTHER): Payer: Medicare Other | Admitting: Ophthalmology

## 2019-11-18 ENCOUNTER — Encounter (INDEPENDENT_AMBULATORY_CARE_PROVIDER_SITE_OTHER): Payer: Medicare Other | Admitting: Ophthalmology

## 2019-11-18 ENCOUNTER — Encounter (INDEPENDENT_AMBULATORY_CARE_PROVIDER_SITE_OTHER): Payer: Self-pay | Admitting: Ophthalmology

## 2019-11-18 DIAGNOSIS — E119 Type 2 diabetes mellitus without complications: Secondary | ICD-10-CM

## 2019-11-18 DIAGNOSIS — I10 Essential (primary) hypertension: Secondary | ICD-10-CM | POA: Diagnosis not present

## 2019-11-18 DIAGNOSIS — H3581 Retinal edema: Secondary | ICD-10-CM

## 2019-11-18 DIAGNOSIS — H35033 Hypertensive retinopathy, bilateral: Secondary | ICD-10-CM | POA: Diagnosis not present

## 2019-11-18 NOTE — Progress Notes (Signed)
[facility] Note  11/18/2019     CHIEF COMPLAINT Patient presents for Diabetic Eye Exam   HISTORY OF PRESENT ILLNESS: Bradley [name] is a 39 y.o. male who presents to the clinic today for:   HPI    Diabetic Eye Exam    Vision is stable, is blurred for near and is blurred for distance.  Diabetes characteristics include Type 2, on insulin and taking oral medications.  This started 5 years ago.  Blood sugar level is uncontrolled.  Last Blood Glucose 500 (Pt states this is typical.).  Last A1C 15 (09/27/19).  Associated Diagnosis Neuropathy.  I, the attending physician,  performed the HPI with the patient and updated documentation appropriately.          Comments    39 y/o male pt referred by Dr. Margarita Rana ([place]) for [facility] [facility].  LEE over 10 yrs ago.  Pt reports VA is fine [place].  Denies pain, flashes, floaters.  No gtts.  BS and A1C consistently extremely high.       Last edited by Bernarda Caffey, MD on 11/20/2019 12:37 PM. (History)    pt is here on the referral of his PCP, Dr. Margarita Rana, for DM exam, pt states his A1c has gone from 13 to 15 bc he was not taking his insulin, he states he does not like giving himself shots  Referring physician: Charlott Rakes, MD [place],  [place] [address]  HISTORICAL INFORMATION:   Selected notes from the [facility]: No current outpatient medications on file. (Ophthalmic Drugs)   No current facility-administered medications for this visit. (Ophthalmic Drugs)   Current Outpatient Medications (Other)  Medication Sig  . albuterol (VENTOLIN HFA) 108 (90 Base) MCG/ACT inhaler Inhale 1-2 puffs into the lungs every 6 (six) hours as needed for wheezing or shortness of breath.  . ARIPiprazole (ABILIFY) 5 MG tablet Take 5 mg by mouth daily.  . Blood Glucose Monitoring Suppl (ONETOUCH VERIO) w/Device KIT Use to check blood sugar up to 3 times daily (E11.69, Z79.4)  .  busPIRone (BUSPAR) 7.5 MG tablet Take 7.5 mg by mouth 2 (two) times daily.  Marland Kitchen doxepin (SINEQUAN) 25 MG capsule Take 25 mg by mouth at bedtime.  . DULoxetine (CYMBALTA) 60 MG capsule Take 90 mg by mouth daily.  Marland Kitchen glipiZIDE (GLUCOTROL) 10 MG tablet Take 1 tablet (10 mg total) by mouth 2 (two) times daily before a meal.  . glucose blood (ONETOUCH VERIO) test strip Use to check blood sugar up to 3 times daily (E11.69, Z79.4)  . Insulin Glargine (BASAGLAR KWIKPEN) 100 UNIT/ML SOPN Inject 0.25 mLs (25 Units total) into the skin at bedtime.  . Insulin Pen Needle (TRUEPLUS PEN NEEDLES) 32G X 4 MM MISC Use as directed to administer victoza  . Menthol (ICY HOT ADVANCED RELIEF) 7.5 % PTCH Apply 1 patch topically as needed (pain).  . metFORMIN (GLUCOPHAGE) 500 MG tablet Take 2 tablets (1,000 mg total) by mouth 2 (two) times daily with a meal.  . OneTouch Delica Lancets 63W MISC Use to check blood sugar up to 3 times daily (E11.69, Z79.4)  . traMADol (ULTRAM) 50 MG tablet Take 1 tablet (50 mg total) by mouth every 12 (twelve) hours as needed (for pain).  . traZODone (DESYREL) 50 MG tablet   . warfarin (COUMADIN) 2.5 MG tablet TAKE AS DIRECTED BY THE COUMADIN CLINIC.  . DULoxetine (CYMBALTA) 30 MG capsule Take 30 mg by  mouth daily.   No current facility-administered medications for this visit. (Other)      REVIEW OF SYSTEMS: ROS    Positive for: Endocrine, Eyes, Psychiatric   Negative for: Constitutional, Gastrointestinal, Neurological, Skin, Genitourinary, Musculoskeletal, HENT, Cardiovascular, Respiratory, Allergic/Imm, Heme/Lymph   Last edited by Matthew Folks, COA on 11/18/2019 10:46 AM. (History)       ALLERGIES Allergies  Allergen Reactions  . Gabapentin Other (See Comments)    Crying spells  . Lyrica [Pregabalin] Other (See Comments)    Makes the patient somnolent    PAST MEDICAL HISTORY Past Medical History:  Diagnosis Date  . ADHD   . ADHD (attention deficit hyperactivity  disorder)   . Bipolar 1 disorder ([place])   . Bipolar disorder ([place])   . Diabetes mellitus without complication ([place])   . Morbid obesity ([place])   . Obesity   . Panic attack   . Varicose veins of both lower extremities with pain    Past Surgical History:  Procedure Laterality Date  . surgery on meatus as a child      FAMILY HISTORY Family History  Problem Relation Age of Onset  . Rheum arthritis Mother   . Diabetes Mother   . Heart failure Mother   . Stroke Mother   . Glaucoma Maternal Grandmother     SOCIAL HISTORY Social History   Tobacco Use  . Smoking status: Current Every Day Smoker    Packs/day: 1.00    Types: Cigarettes  . Smokeless tobacco: Never Used  Substance Use Topics  . Alcohol use: Yes    Comment: Rarely  . Drug use: No         OPHTHALMIC EXAM:  Base Eye Exam    Visual Acuity (Snellen - Linear)      Right Left   Dist [place] 20/40 20/50 -2   Dist ph [place] 20/30 20/30       Tonometry (Tonopen, 10:48 AM)      Right Left   Pressure 14 15       Pupils      Dark Light Shape React APD   Right 5 3 Round Brisk None   Left 5 3 Round Brisk None       Visual Fields (Counting fingers)      Left Right    Full Full       Extraocular Movement      Right Left    Full, Ortho Full, Ortho       Neuro/Psych    Oriented x3: Yes   Mood/Affect: Normal       Dilation    Both eyes: 1.0% Mydriacyl, 2.5% Phenylephrine @ 10:48 AM        Slit Lamp and Fundus Exam    Slit Lamp Exam      Right Left   Lids/Lashes Mild Meibomian gland dysfunction Mild Meibomian gland dysfunction   Conjunctiva/Sclera White and quiet White and quiet   Cornea 1+ inferior Punctate epithelial erosions 1+ inferior Punctate epithelial erosions, mild Debris in tear film   Anterior Chamber Deep and quiet Deep and quiet   Iris Round and dilated, No NVI Round and dilated, No NVI   Lens Clear Clear   Vitreous Normal Normal       Fundus Exam      Right Left   Disc Pink and Sharp  Pink and Sharp   C/D Ratio 0.3 0.2   Macula Flat, Good foveal reflex, No heme or edema Flat, Good foveal reflex,  mild Retinal pigment epithelial mottling, No heme or edema   Vessels Vascular attenuation, severe Tortuous, dilated venules Vascular attenuation, severe Tortuous, dilated venules   Periphery Attached, no heme Attached, no heme        Refraction    Manifest Refraction      Sphere Cylinder Axis Dist VA   Right +0.50 +0.50 180 20/30-2   Left -1.25 Sphere  20/20          IMAGING AND PROCEDURES  Imaging and Procedures for _0 @  OCT, Retina - OU - Both Eyes       Right Eye Quality was good. Central Foveal Thickness: 255. Progression has no prior data. Findings include normal foveal contour, no IRF, no SRF.   Left Eye Quality was good. Central Foveal Thickness: 248. Progression has no prior data. Findings include normal foveal contour, no IRF, no SRF.   Notes *Images captured and stored on drive  Diagnosis / Impression:  NFP, no IRF/SRF OU No DME OU  Clinical management:  See below  Abbreviations: NFP - Normal foveal profile. CME - cystoid macular edema. PED - pigment epithelial detachment. IRF - intraretinal fluid. SRF - subretinal fluid. EZ - ellipsoid zone. ERM - epiretinal membrane. ORA - outer retinal atrophy. ORT - outer retinal tubulation. SRHM - subretinal hyper-reflective material                 ASSESSMENT/PLAN:    ICD-10-CM   1. Diabetes mellitus type 2 without retinopathy ([place])  E11.9   2. Retinal edema  H35.81 OCT, Retina - OU - Both Eyes  3. Essential hypertension  I10   4. Hypertensive retinopathy of both eyes  H35.033     1,2. Diabetes mellitus, type 2 without retinopathy  - HbA1c >15 on 12.29.20 from 13.3 on 9.23.20  - The incidence, risk factors for progression, natural history and treatment options for diabetic retinopathy  were discussed with patient.    - The need for close monitoring of blood glucose, blood pressure, and  serum lipids, avoiding cigarette or any type of tobacco, and the need for long term follow up was also discussed with patient.  - f/u in 6-9 months, sooner prn  3,4. Hypertensive retinopathy OU  - discussed importance of tight BP control  - history of PE on DVT -- on coumadin  - monitor   Ophthalmic Meds Ordered this visit:  No orders of the defined types were placed in this encounter.      Return for f/u 6-9 months DM exam, DFE, OCT.  There are no Patient Instructions on file for this visit.   Explained the diagnoses, plan, and follow up with the patient and they expressed understanding.  Patient expressed understanding of the importance of proper follow up care.   This document serves as a record of services personally performed by Gardiner Sleeper, MD, PhD. It was created on their behalf by Ernest Mallick, OA, an ophthalmic assistant. The creation of this record is the provider's dictation and/or activities during the visit.    Electronically signed by: Ernest Mallick, OA 02.19.2021 1:44 PM   Gardiner Sleeper, M.D., Ph.D. Diseases & Surgery of the Retina and Vitreous [facility]  I have reviewed the above documentation for accuracy and completeness, and I agree with the above. Gardiner Sleeper, M.D., Ph.D. 11/20/19 1:47 PM   Abbreviations: M myopia (nearsighted); A astigmatism; H hyperopia (farsighted); P presbyopia; Mrx spectacle prescription;  CTL contact lenses; OD right eye;  OS left eye; OU both eyes  XT exotropia; ET esotropia; PEK punctate epithelial keratitis; PEE punctate epithelial erosions; DES dry eye syndrome; MGD meibomian gland dysfunction; ATs artificial tears; PFAT's preservative free artificial tears; [place] nuclear sclerotic cataract; PSC posterior subcapsular cataract; ERM epi-retinal membrane; PVD posterior vitreous detachment; RD retinal detachment; DM diabetes mellitus; DR diabetic retinopathy; NPDR non-proliferative diabetic retinopathy; PDR  proliferative diabetic retinopathy; CSME clinically significant macular edema; DME diabetic macular edema; dbh dot blot hemorrhages; CWS cotton wool spot; POAG primary open angle glaucoma; C/D cup-to-disc ratio; HVF humphrey visual field; GVF goldmann visual field; OCT optical coherence tomography; IOP intraocular pressure; BRVO Branch retinal vein occlusion; CRVO central retinal vein occlusion; CRAO central retinal artery occlusion; BRAO branch retinal artery occlusion; RT retinal tear; SB scleral buckle; PPV pars plana vitrectomy; VH Vitreous hemorrhage; PRP panretinal laser photocoagulation; IVK intravitreal kenalog; VMT vitreomacular traction; MH Macular hole;  NVD neovascularization of the disc; NVE neovascularization elsewhere; AREDS age related eye disease study; ARMD age related macular degeneration; POAG primary open angle glaucoma; EBMD epithelial/anterior basement membrane dystrophy; ACIOL anterior chamber intraocular lens; IOL intraocular lens; PCIOL posterior chamber intraocular lens; Phaco/IOL phacoemulsification with intraocular lens placement; [place] photorefractive keratectomy; LASIK laser assisted in situ keratomileusis; HTN hypertension; DM diabetes mellitus; COPD chronic obstructive pulmonary disease

## 2019-11-20 ENCOUNTER — Encounter (INDEPENDENT_AMBULATORY_CARE_PROVIDER_SITE_OTHER): Payer: Self-pay | Admitting: Ophthalmology

## 2019-11-22 DIAGNOSIS — F4312 Post-traumatic stress disorder, chronic: Secondary | ICD-10-CM | POA: Diagnosis not present

## 2019-11-22 DIAGNOSIS — F411 Generalized anxiety disorder: Secondary | ICD-10-CM | POA: Diagnosis not present

## 2019-11-22 DIAGNOSIS — F3132 Bipolar disorder, current episode depressed, moderate: Secondary | ICD-10-CM | POA: Diagnosis not present

## 2019-11-23 MED FILL — traMADol HCL 50 MG TABS: 50 | 30 days supply | Qty: 60 | Fill #2

## 2019-11-24 ENCOUNTER — Ambulatory Visit: Payer: Medicare Other | Attending: Family Medicine | Admitting: Pharmacist

## 2019-11-24 ENCOUNTER — Other Ambulatory Visit: Payer: Self-pay

## 2019-11-24 DIAGNOSIS — I2699 Other pulmonary embolism without acute cor pulmonale: Secondary | ICD-10-CM | POA: Diagnosis not present

## 2019-11-24 DIAGNOSIS — I825Y2 Chronic embolism and thrombosis of unspecified deep veins of left proximal lower extremity: Secondary | ICD-10-CM | POA: Diagnosis not present

## 2019-11-24 LAB — POCT INR: INR: 1.9 — AB (ref 2.0–3.0)

## 2019-11-29 MED FILL — DULoxetine HCL 60 MG CPEP: 60 | 30 days supply | Qty: 30 | Fill #1

## 2019-11-29 MED FILL — WARFARIN NA 2.5 MG TAB: 2.5 | 30 days supply | Qty: 30 | Fill #2

## 2019-11-29 MED FILL — DULoxetine HCL 30 MG CPEP: 30 | 30 days supply | Qty: 30 | Fill #1

## 2019-12-05 MED FILL — ARIPiprazole 5 MG TABS: 5 | 30 days supply | Qty: 30 | Fill #1

## 2019-12-08 ENCOUNTER — Ambulatory Visit: Payer: Medicare Other | Admitting: Pharmacist

## 2019-12-08 ENCOUNTER — Other Ambulatory Visit: Payer: Self-pay

## 2019-12-08 ENCOUNTER — Ambulatory Visit: Payer: Medicare Other | Attending: Family Medicine | Admitting: Pharmacist

## 2019-12-08 DIAGNOSIS — I825Y2 Chronic embolism and thrombosis of unspecified deep veins of left proximal lower extremity: Secondary | ICD-10-CM | POA: Diagnosis not present

## 2019-12-08 DIAGNOSIS — I2699 Other pulmonary embolism without acute cor pulmonale: Secondary | ICD-10-CM | POA: Diagnosis not present

## 2019-12-08 LAB — POCT INR: INR: 1.8 — AB (ref 2.0–3.0)

## 2019-12-13 DIAGNOSIS — F411 Generalized anxiety disorder: Secondary | ICD-10-CM | POA: Diagnosis not present

## 2019-12-13 DIAGNOSIS — F4312 Post-traumatic stress disorder, chronic: Secondary | ICD-10-CM | POA: Diagnosis not present

## 2019-12-13 DIAGNOSIS — F3132 Bipolar disorder, current episode depressed, moderate: Secondary | ICD-10-CM | POA: Diagnosis not present

## 2019-12-15 ENCOUNTER — Other Ambulatory Visit: Payer: Self-pay

## 2019-12-15 ENCOUNTER — Ambulatory Visit: Payer: Medicare Other | Attending: Family Medicine | Admitting: Pharmacist

## 2019-12-15 DIAGNOSIS — I825Y2 Chronic embolism and thrombosis of unspecified deep veins of left proximal lower extremity: Secondary | ICD-10-CM | POA: Insufficient documentation

## 2019-12-15 DIAGNOSIS — I2699 Other pulmonary embolism without acute cor pulmonale: Secondary | ICD-10-CM | POA: Diagnosis not present

## 2019-12-15 LAB — POCT INR: INR: 2.6 (ref 2.0–3.0)

## 2019-12-21 DIAGNOSIS — F3132 Bipolar disorder, current episode depressed, moderate: Secondary | ICD-10-CM | POA: Diagnosis not present

## 2019-12-21 DIAGNOSIS — F4312 Post-traumatic stress disorder, chronic: Secondary | ICD-10-CM | POA: Diagnosis not present

## 2019-12-21 DIAGNOSIS — F411 Generalized anxiety disorder: Secondary | ICD-10-CM | POA: Diagnosis not present

## 2019-12-21 MED FILL — busPIRone HCL 7.5 MG TABS: 7.5 | 30 days supply | Qty: 60 | Fill #0

## 2019-12-27 ENCOUNTER — Ambulatory Visit: Payer: Medicare Other | Attending: Family Medicine | Admitting: Physician Assistant

## 2019-12-27 ENCOUNTER — Other Ambulatory Visit: Payer: Self-pay

## 2019-12-27 VITALS — BP 151/95 | HR 109 | Temp 97.9°F | Resp 16 | Wt 326.4 lb

## 2019-12-27 DIAGNOSIS — Z794 Long term (current) use of insulin: Secondary | ICD-10-CM | POA: Diagnosis not present

## 2019-12-27 DIAGNOSIS — R739 Hyperglycemia, unspecified: Secondary | ICD-10-CM

## 2019-12-27 DIAGNOSIS — Z91199 Patient's noncompliance with other medical treatment and regimen due to unspecified reason: Secondary | ICD-10-CM

## 2019-12-27 DIAGNOSIS — F3162 Bipolar disorder, current episode mixed, moderate: Secondary | ICD-10-CM

## 2019-12-27 DIAGNOSIS — E1149 Type 2 diabetes mellitus with other diabetic neurological complication: Secondary | ICD-10-CM

## 2019-12-27 DIAGNOSIS — Z79899 Other long term (current) drug therapy: Secondary | ICD-10-CM

## 2019-12-27 DIAGNOSIS — E1165 Type 2 diabetes mellitus with hyperglycemia: Secondary | ICD-10-CM | POA: Diagnosis not present

## 2019-12-27 DIAGNOSIS — Z9119 Patient's noncompliance with other medical treatment and regimen: Secondary | ICD-10-CM

## 2019-12-27 LAB — POCT URINALYSIS DIP (CLINITEK)
Bilirubin, UA: NEGATIVE
Glucose, UA: >=1000 mg/dL — AB
Ketones, POC UA: NEGATIVE mg/dL
Leukocytes, UA: NEGATIVE
Nitrite, UA: NEGATIVE
POC PROTEIN,UA: NEGATIVE
Spec Grav, UA: <=1.005 — AB (ref 1.010–1.025)
Urobilinogen, UA: 0.2 E.U./dL
pH, UA: 5.5 (ref 5.0–8.0)

## 2019-12-27 LAB — POCT GLYCOSYLATED HEMOGLOBIN (HGB A1C): HbA1c, POC (controlled diabetic range): 14.9 % — AB (ref 0.0–7.0)

## 2019-12-27 LAB — GLUCOSE, POCT (MANUAL RESULT ENTRY)
POC Glucose: >600 mg/dl (ref 70–99)
POC Glucose: 593 mg/dl — AB (ref 70–99)

## 2019-12-27 MED ORDER — TRAMADOL HCL 50 MG PO TABS: 50.0000 mg | ORAL_TABLET | Freq: Two times a day (BID) | ORAL | 0 refills | Status: DC | PRN

## 2019-12-27 MED ORDER — GLIPIZIDE 10 MG PO TABS: 10.0000 mg | ORAL_TABLET | Freq: Two times a day (BID) | ORAL | 3 refills | Status: DC

## 2019-12-27 MED ORDER — BASAGLAR KWIKPEN 100 UNIT/ML ~~LOC~~ SOPN: 50.0000 [IU] | PEN_INJECTOR | Freq: Every day | SUBCUTANEOUS | 0 refills | Status: DC

## 2019-12-27 MED ORDER — TRULICITY 0.75 MG/0.5ML ~~LOC~~ SOAJ: 0.7500 mg | SUBCUTANEOUS | 0 refills | Status: DC

## 2019-12-27 MED FILL — glipiZIDE 10 MG TABS: 10 | 30 days supply | Qty: 60 | Fill #0

## 2019-12-27 NOTE — Patient Instructions (Addendum)
You are going to stop Metformin.  You are going to increase your night time insulin dose to 50 units.  You will start Trulicity, it is a once weekly injection.  Please work on reducing your sweet tea intake!  Remember, a gallon of sweet tea is like eating 8 Snickers!!!  Please bring in your blood sugar app next week when you see Franky Macho so he can see how they are improving and share with him your success with reducing/eliminating sweet tea  Please be prepared for fasting labs when you come in to have coumadin check on Thursday   Diabetes Mellitus and Nutrition, Adult When you have diabetes (diabetes mellitus), it is very important to have healthy eating habits because your blood sugar (glucose) levels are greatly affected by what you eat and drink. Eating healthy foods in the appropriate amounts, at about the same times every day, can help you:  Control your blood glucose.  Lower your risk of heart disease.  Improve your blood pressure.  Reach or maintain a healthy weight. Every person with diabetes is different, and each person has different needs for a meal plan. Your health care provider may recommend that you work with a diet and nutrition specialist (dietitian) to make a meal plan that is best for you. Your meal plan may vary depending on factors such as:  The calories you need.  The medicines you take.  Your weight.  Your blood glucose, blood pressure, and cholesterol levels.  Your activity level.  Other health conditions you have, such as heart or kidney disease. How do carbohydrates affect me? Carbohydrates, also called carbs, affect your blood glucose level more than any other type of food. Eating carbs naturally raises the amount of glucose in your blood. Carb counting is a method for keeping track of how many carbs you eat. Counting carbs is important to keep your blood glucose at a healthy level, especially if you use insulin or take certain oral diabetes medicines. It is  important to know how many carbs you can safely have in each meal. This is different for every person. Your dietitian can help you calculate how many carbs you should have at each meal and for each snack. Foods that contain carbs include:  Bread, cereal, rice, pasta, and crackers.  Potatoes and corn.  Peas, beans, and lentils.  Milk and yogurt.  Fruit and juice.  Desserts, such as cakes, cookies, ice cream, and candy. How does alcohol affect me? Alcohol can cause a sudden decrease in blood glucose (hypoglycemia), especially if you use insulin or take certain oral diabetes medicines. Hypoglycemia can be a life-threatening condition. Symptoms of hypoglycemia (sleepiness, dizziness, and confusion) are similar to symptoms of having too much alcohol. If your health care provider says that alcohol is safe for you, follow these guidelines:  Limit alcohol intake to no more than 1 drink per day for nonpregnant women and 2 drinks per day for men. One drink equals 12 oz of beer, 5 oz of wine, or 1 oz of hard liquor.  Do not drink on an empty stomach.  Keep yourself hydrated with water, diet soda, or unsweetened iced tea.  Keep in mind that regular soda, juice, and other mixers may contain a lot of sugar and must be counted as carbs. What are tips for following this plan?  Reading food labels  Start by checking the serving size on the "Nutrition Facts" label of packaged foods and drinks. The amount of calories, carbs, fats, and other  nutrients listed on the label is based on one serving of the item. Many items contain more than one serving per package.  Check the total grams (g) of carbs in one serving. You can calculate the number of servings of carbs in one serving by dividing the total carbs by 15. For example, if a food has 30 g of total carbs, it would be equal to 2 servings of carbs.  Check the number of grams (g) of saturated and trans fats in one serving. Choose foods that have low or  no amount of these fats.  Check the number of milligrams (mg) of salt (sodium) in one serving. Most people should limit total sodium intake to less than 2,300 mg per day.  Always check the nutrition information of foods labeled as "low-fat" or "nonfat". These foods may be higher in added sugar or refined carbs and should be avoided.  Talk to your dietitian to identify your daily goals for nutrients listed on the label. Shopping  Avoid buying canned, premade, or processed foods. These foods tend to be high in fat, sodium, and added sugar.  Shop around the outside edge of the grocery store. This includes fresh fruits and vegetables, bulk grains, fresh meats, and fresh dairy. Cooking  Use low-heat cooking methods, such as baking, instead of high-heat cooking methods like deep frying.  Cook using healthy oils, such as olive, canola, or sunflower oil.  Avoid cooking with butter, cream, or high-fat meats. Meal planning  Eat meals and snacks regularly, preferably at the same times every day. Avoid going long periods of time without eating.  Eat foods high in fiber, such as fresh fruits, vegetables, beans, and whole grains. Talk to your dietitian about how many servings of carbs you can eat at each meal.  Eat 4-6 ounces (oz) of lean protein each day, such as lean meat, chicken, fish, eggs, or tofu. One oz of lean protein is equal to: ? 1 oz of meat, chicken, or fish. ? 1 egg. ?  cup of tofu.  Eat some foods each day that contain healthy fats, such as avocado, nuts, seeds, and fish. Lifestyle  Check your blood glucose regularly.  Exercise regularly as told by your health care provider. This may include: ? 150 minutes of moderate-intensity or vigorous-intensity exercise each week. This could be brisk walking, biking, or water aerobics. ? Stretching and doing strength exercises, such as yoga or weightlifting, at least 2 times a week.  Take medicines as told by your health care  provider.  Do not use any products that contain nicotine or tobacco, such as cigarettes and e-cigarettes. If you need help quitting, ask your health care provider.  Work with a Social worker or diabetes educator to identify strategies to manage stress and any emotional and social challenges. Questions to ask a health care provider  Do I need to meet with a diabetes educator?  Do I need to meet with a dietitian?  What number can I call if I have questions?  When are the best times to check my blood glucose? Where to find more information:  American Diabetes Association: diabetes.org  Academy of Nutrition and Dietetics: www.eatright.[employer] of Diabetes and Digestive and Kidney Diseases (NIH): [employer] Summary  A healthy meal plan will help you control your blood glucose and maintain a healthy lifestyle.  Working with a diet and nutrition specialist (dietitian) can help you make a meal plan that is best for you.  Keep in mind that carbohydrates (  carbs) and alcohol have immediate effects on your blood glucose levels. It is important to count carbs and to use alcohol carefully. This information is not intended to replace advice given to you by your health care provider. Make sure you discuss any questions you have with your health care provider. Document Revised: 08/28/2017 Document Reviewed: 10/20/2016 Elsevier Patient Education  2020 [employer].

## 2019-12-27 NOTE — Progress Notes (Signed)
Established Patient Office Visit  Subjective:  Patient ID: Bradley [name], male    DOB: 29-Nov-1980  Age: 39 y.o. MRN: [id number]  CC:  Chief Complaint  Patient presents with  . Diabetes  . Hypertension    HPI Bradley [name] presents for medication refills  Reports that he decided to restart his diabetes medications 1 week ago.  Reports that he is giving himself 15 units of insulin, is taking 500 mg of Metformin, and is taking 10 mg of glipizide twice a day, but does endorse that he ran out of the glipizide 2 days ago.  Reports if he takes more than 500 mg of Metformin that he has uncontrollable diarrhea.  Reports that he has an extreme fear of needles, but since restarting his medications he believes that he will be able to continue to give himself injections.  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  Continues to use tramadol on a daily basis, states that he continues to have neuropathy, states the tramadol does offer some relief.  Reports that he has been compliant to his psychiatric medications.  Reports that he has been compliant to his Coumadin, does see clinical pharmacist on a regular basis.   Past Medical History:  Diagnosis Date  . ADHD   . ADHD (attention deficit hyperactivity disorder)   . Bipolar 1 disorder ([place])   . Bipolar disorder ([employer])   . Diabetes mellitus without complication ([place])   . Morbid obesity ([place])   . Obesity   . Panic attack   . Varicose veins of both lower extremities with pain     Past Surgical History:  Procedure Laterality Date  . surgery on meatus as a child      Family History  Problem Relation Age of Onset  . Rheum arthritis Mother   . Diabetes Mother   . Heart failure Mother   . Stroke Mother   . Glaucoma Maternal Grandmother     Social History   Socioeconomic History  .  Marital status: Single    Spouse name: Not on file  . Number of children: Not on file  . Years of education: Not on file  . Highest education level: Not on file  Occupational History  . Not on file  Tobacco Use  . Smoking status: Current Every Day Smoker    Packs/day: 1.00    Types: Cigarettes  . Smokeless tobacco: Never Used  Substance and Sexual Activity  . Alcohol use: Yes    Comment: Rarely  . Drug use: No  . Sexual activity: Not on file  Other Topics Concern  . Not on file  Social History Narrative   ** Merged History Encounter **       Social Determinants of Health   Financial Resource Strain:   . Difficulty of Paying Living Expenses:   Food Insecurity:   . Worried About Charity fundraiser in the Last Year:   . Arboriculturist in the Last Year:   Transportation Needs:   . Film/video editor (Medical):   Marland Kitchen Lack of Transportation (Non-Medical):   Physical Activity:   . Days of Exercise per Week:   . Minutes of Exercise per Session:   Stress:   . Feeling of Stress :   Social Connections:   . Frequency of  Communication with Friends and Family:   . Frequency of Social Gatherings with Friends and Family:   . Attends Religious Services:   . Active Member of Clubs or Organizations:   . Attends Archivist Meetings:   Marland Kitchen Marital Status:   Intimate Partner Violence:   . Fear of Current or Ex-Partner:   . Emotionally Abused:   Marland Kitchen Physically Abused:   . Sexually Abused:     Outpatient Medications Prior to Visit  Medication Sig Dispense Refill  . albuterol (VENTOLIN HFA) 108 (90 Base) MCG/ACT inhaler Inhale 1-2 puffs into the lungs every 6 (six) hours as needed for wheezing or shortness of breath. 18 g 2  . ARIPiprazole (ABILIFY) 5 MG tablet Take 5 mg by mouth daily.    . Blood Glucose Monitoring Suppl (ONETOUCH VERIO) w/Device KIT Use to check blood sugar up to 3 times daily (E11.69, Z79.4) 1 kit 0  . busPIRone (BUSPAR) 7.5 MG tablet Take 7.5 mg by mouth  2 (two) times daily.    Marland Kitchen doxepin (SINEQUAN) 25 MG capsule Take 25 mg by mouth at bedtime.    . DULoxetine (CYMBALTA) 30 MG capsule Take 30 mg by mouth daily.    . DULoxetine (CYMBALTA) 60 MG capsule Take 90 mg by mouth daily.    Marland Kitchen glucose blood (ONETOUCH VERIO) test strip Use to check blood sugar up to 3 times daily (E11.69, Z79.4) 100 each 11  . Insulin Pen Needle (TRUEPLUS PEN NEEDLES) 32G X 4 MM MISC Use as directed to administer victoza 100 each 3  . Menthol (ICY HOT ADVANCED RELIEF) 7.5 % PTCH Apply 1 patch topically as needed (pain).    . metFORMIN (GLUCOPHAGE) 500 MG tablet Take 2 tablets (1,000 mg total) by mouth 2 (two) times daily with a meal. 120 tablet 3  . OneTouch Delica Lancets 76O MISC Use to check blood sugar up to 3 times daily (E11.69, Z79.4) 100 each 11  . traZODone (DESYREL) 50 MG tablet     . warfarin (COUMADIN) 2.5 MG tablet TAKE AS DIRECTED BY THE COUMADIN CLINIC. 30 tablet 2  . glipiZIDE (GLUCOTROL) 10 MG tablet Take 1 tablet (10 mg total) by mouth 2 (two) times daily before a meal. 60 tablet 3  . Insulin Glargine (BASAGLAR KWIKPEN) 100 UNIT/ML SOPN Inject 0.25 mLs (25 Units total) into the skin at bedtime. 30 mL 3  . traMADol (ULTRAM) 50 MG tablet Take 1 tablet (50 mg total) by mouth every 12 (twelve) hours as needed (for pain). 60 tablet 2   No facility-administered medications prior to visit.    Allergies  Allergen Reactions  . Gabapentin Other (See Comments)    Crying spells  . Lyrica [Pregabalin] Other (See Comments)    Makes the patient somnolent    ROS Review of Systems  Constitutional: Negative.   HENT: Negative.   Eyes: Negative.   Respiratory: Negative.   Cardiovascular: Positive for leg swelling.  Gastrointestinal: Positive for vomiting. Negative for diarrhea and nausea.  Endocrine: Negative.   Genitourinary: Negative.   Musculoskeletal: Negative.   Skin: Positive for color change and rash.  Allergic/Immunologic: Negative.   Neurological:  Positive for weakness. Negative for dizziness and headaches.  Hematological: Negative.   Psychiatric/Behavioral: Negative for self-injury and suicidal ideas. The patient is nervous/anxious.       Objective:    Physical Exam  Constitutional: He is oriented to person, place, and time. He appears well-developed and well-nourished. No distress.  HENT:  Head: Normocephalic and atraumatic.  Right Ear: External ear normal.  Left Ear: External ear normal.  Nose: Nose normal.  Mouth/Throat: Oropharynx is clear and moist.  Eyes: Pupils are equal, round, and reactive to light. Conjunctivae and EOM are normal.  Cardiovascular: Normal rate, regular rhythm and normal heart sounds.  Pulmonary/Chest: Effort normal and breath sounds normal.  Abdominal: Soft. Bowel sounds are normal.  Musculoskeletal:        General: Edema present.     Cervical back: Normal range of motion and neck supple.  Neurological: He is alert and oriented to person, place, and time.  Skin: He is not diaphoretic.  BLE  - skin dry, slight erythema noted, no weeping noted, edema noted  Psychiatric: He has a normal mood and affect. His behavior is normal. Judgment and thought content normal.  Nursing note and vitals reviewed.   BP (!) 151/95   Pulse (!) 109   Temp 97.9 F (36.6 C)   Resp 16   Wt (!) 326 lb 6.4 oz (148.1 kg)   SpO2 97%   BMI 44.27 kg/m  Wt Readings from Last 3 Encounters:  12/27/19 (!) 326 lb 6.4 oz (148.1 kg)  09/27/19 (!) 337 lb (152.9 kg)  06/22/19 (!) 353 lb (160.1 kg)     Health Maintenance Due  Topic Date Due  . OPHTHALMOLOGY EXAM  Never done  . TETANUS/TDAP  Never done  . URINE MICROALBUMIN  11/20/2019    There are no preventive care reminders to display for this patient.  Lab Results  Component Value Date   TSH 2.06 11/12/2016   Lab Results  Component Value Date   WBC 5.9 04/14/2019   HGB 15.7 04/14/2019   HCT 47.5 04/14/2019   MCV 84.2 04/14/2019   PLT 88 (L) 04/14/2019    Lab Results  Component Value Date   NA 134 (L) 04/14/2019   K 4.2 04/14/2019   CO2 24 04/14/2019   GLUCOSE 374 (H) 04/14/2019   BUN 6 04/14/2019   CREATININE 0.78 04/14/2019   BILITOT 0.6 04/14/2019   ALKPHOS 94 04/14/2019   AST 33 04/14/2019   ALT 43 04/14/2019   PROT 6.3 (L) 04/14/2019   ALBUMIN 3.3 (L) 04/14/2019   CALCIUM 8.5 (L) 04/14/2019   ANIONGAP 9 04/14/2019   Lab Results  Component Value Date   CHOL 132 11/19/2018   Lab Results  Component Value Date   HDL 37 (L) 11/19/2018   Lab Results  Component Value Date   LDLCALC 72 11/19/2018   Lab Results  Component Value Date   TRIG 115 11/19/2018   Lab Results  Component Value Date   CHOLHDL 3.6 11/19/2018   Lab Results  Component Value Date   HGBA1C 14.9 (A) 12/27/2019      Assessment & Plan:   Problem List Items Addressed This Visit      Endocrine   DM2 (diabetes mellitus, type 2) (HCC) (Chronic)   Relevant Medications   glipiZIDE (GLUCOTROL) 10 MG tablet   Dulaglutide (TRULICITY) 3.76 EG/3.1DV SOPN   Insulin Glargine (BASAGLAR KWIKPEN) 100 UNIT/ML   Other Relevant Orders   POCT glucose (manual entry) (Completed)   POCT glycosylated hemoglobin (Hb A1C) (Completed)   POCT URINALYSIS DIP (CLINITEK) (Completed)   POCT glucose (manual entry) (Completed)   Diabetic neuropathy (HCC)   Relevant Medications   glipiZIDE (GLUCOTROL) 10 MG tablet   traMADol (ULTRAM) 50 MG tablet   Dulaglutide (TRULICITY) 7.61 YW/7.3XT SOPN   Insulin Glargine (BASAGLAR KWIKPEN) 100 UNIT/ML  Other   Bipolar disorder, current episode mixed, moderate ([place])    Other Visit Diagnoses    Hyperglycemia    -  Primary   Relevant Medications   Insulin Glargine (BASAGLAR KWIKPEN) 100 UNIT/ML   Non-compliance          Meds ordered this encounter  Medications  . glipiZIDE (GLUCOTROL) 10 MG tablet    Sig: Take 1 tablet (10 mg total) by mouth 2 (two) times daily before a meal.    Dispense:  60 tablet    Refill:  3     Order Specific Question:   Supervising Provider    Answer:   Asencion Noble E [[id number]]  . traMADol (ULTRAM) 50 MG tablet    Sig: Take 1 tablet (50 mg total) by mouth every 12 (twelve) hours as needed (for pain).    Dispense:  60 tablet    Refill:  0    Order Specific Question:   Supervising Provider    Answer:   Joya Gaskins, PATRICK E [[id number]]  . Dulaglutide (TRULICITY) 9.47 SJ/6.2EZ SOPN    Sig: Inject 0.75 mg into the skin once a week.    Dispense:  4 pen    Refill:  0    Order Specific Question:   Supervising Provider    Answer:   Asencion Noble E [[id number]]  . Insulin Glargine (BASAGLAR KWIKPEN) 100 UNIT/ML    Sig: Inject 0.5 mLs (50 Units total) into the skin at bedtime.    Dispense:  60 mL    Refill:  0    Dose change.    Order Specific Question:   Supervising Provider    Answer:   Asencion Noble E [[id number]]  1. Type 2 diabetes mellitus with hyperglycemia, with long-term current use of insulin ([place]) Patient presented with a blood glucose of over 600, UA was negative for ketones, patient refused to present to the emergency department, patient was given 20 units of insulin, blood glucose after 15 minutes had reduced 593.  Patient denied any hyperglycemia symptoms  Patient was counseled on improving diet, reducing sugar intake, will increase insulin glargine 50 mg, stop Metformin due to noncompliance secondary to adverse effects, trial of Trulicity, continue glipizide, patient understands and agrees  Patient is due to follow-up on Thursday for Coumadin clinic with clinical pharmacist, patient will complete fasting labs at that time, will also have patient follow-up in 1 week with clinical pharmacist to review glucose meter, use of Trulicity  - POCT glucose (manual entry) - POCT glycosylated hemoglobin (Hb A1C) - POCT URINALYSIS DIP (CLINITEK) - glipiZIDE (GLUCOTROL) 10 MG tablet; Take 1 tablet (10 mg total) by mouth 2 (two) times daily before a meal.  Dispense: 60 tablet; Refill: 3 -  Dulaglutide (TRULICITY) 6.62 HU/7.6LY SOPN; Inject 0.75 mg into the skin once a week.  Dispense: 4 pen; Refill: 0 - POCT glucose (manual entry)  2. Other diabetic neurological complication associated with type 2 diabetes mellitus ([place]) Check of [place] controlled substance reporting was within normal limits, 1 month supply given without refills - traMADol (ULTRAM) 50 MG tablet; Take 1 tablet (50 mg total) by mouth every 12 (twelve) hours as needed (for pain).  Dispense: 60 tablet; Refill: 0  3. Hyperglycemia  - Insulin Glargine (BASAGLAR KWIKPEN) 100 UNIT/ML; Inject 0.5 mLs (50 Units total) into the skin at bedtime.  Dispense: 60 mL; Refill: 0  4. Non-compliance Gave patient education on ways to be more compliant to his medications  5. Bipolar disorder, current episode  mixed, moderate ([place]) Managed by behavioral health, continue current regimen   I have reviewed the patient's medical history (PMH, PSH, Social History, Family History, Medications, and allergies) , and have been updated if relevant. I spent 30 minutes reviewing chart and  face to face time with patient.     Follow-up: Return in about 1 week (around 01/03/2020).    Loraine Grip Nyeshia Mysliwiec, PA-C

## 2019-12-28 ENCOUNTER — Other Ambulatory Visit: Payer: Self-pay | Admitting: Family Medicine

## 2019-12-28 ENCOUNTER — Encounter: Payer: Self-pay | Admitting: Podiatry

## 2019-12-28 ENCOUNTER — Ambulatory Visit (INDEPENDENT_AMBULATORY_CARE_PROVIDER_SITE_OTHER): Payer: Medicare Other | Admitting: Podiatry

## 2019-12-28 VITALS — Temp 96.3°F

## 2019-12-28 DIAGNOSIS — B351 Tinea unguium: Secondary | ICD-10-CM

## 2019-12-28 DIAGNOSIS — M79675 Pain in left toe(s): Secondary | ICD-10-CM | POA: Diagnosis not present

## 2019-12-28 DIAGNOSIS — E119 Type 2 diabetes mellitus without complications: Secondary | ICD-10-CM

## 2019-12-28 DIAGNOSIS — I872 Venous insufficiency (chronic) (peripheral): Secondary | ICD-10-CM

## 2019-12-28 DIAGNOSIS — M79674 Pain in right toe(s): Secondary | ICD-10-CM

## 2019-12-28 MED FILL — BASAGLAR 100 UNIT/ML KWIKPE: 100 | 30 days supply | Qty: 15 | Fill #0

## 2019-12-28 MED FILL — TRULICITY 0.75 MG/0.5 ML PE: 0.75 | 28 days supply | Qty: 2 | Fill #0

## 2019-12-28 MED FILL — traMADol HCL 50 MG TABS: 50 | 30 days supply | Qty: 60 | Fill #0

## 2019-12-28 MED FILL — DULoxetine HCL 60 MG CPEP: 60 | 30 days supply | Qty: 30 | Fill #0

## 2019-12-28 NOTE — Progress Notes (Signed)
This patient returns to my office for at risk foot care.  This patient requires this care by a professional since this patient will be at risk due to having diabetic neuropathy and history of DVT and venous stasis.  This patient is unable to cut nails himself since the patient cannot reach his nails.These nails are painful walking and wearing shoes.  This patient presents for at risk foot care today.  Patient is taking coumadin.    General Appearance  Alert, conversant and in no acute stress.  Vascular  Dorsalis pedis and posterior tibial  pulses are palpable  bilaterally.  Capillary return is within normal limits  bilaterally. Temperature is within normal limits  Bilaterally. Severe venous stasis left leg and brownish right leg and feet.  B/L.  Neurologic  Senn-Weinstein monofilament wire test within normal limits  bilaterally. Muscle power within normal limits bilaterally.  Nails Thick disfigured discolored nails with subungual debris  from hallux to fifth toes bilaterally. No evidence of bacterial infection or drainage bilaterally.  Orthopedic  No limitations of motion  feet .  No crepitus or effusions noted.  No bony pathology or digital deformities noted.  Skin  normotropic skin with no porokeratosis noted bilaterally.  No signs of infections or ulcers noted.     Onychomycosis  Pain in right toes  Pain in left toes  Consent was obtained for treatment procedures.   Mechanical debridement of nails 1-5  bilaterally performed with a nail nipper.  Filed with dremel without incident.    Return office visit   3 months                  Told patient to return for periodic foot care and evaluation due to potential at risk complications.   Helane Gunther DPM

## 2019-12-28 NOTE — Progress Notes (Signed)
Gave pt 20 units of novolog at 430pm.

## 2019-12-29 ENCOUNTER — Ambulatory Visit: Payer: Medicare Other | Attending: Family Medicine | Admitting: Pharmacist

## 2019-12-29 ENCOUNTER — Other Ambulatory Visit: Payer: Self-pay

## 2019-12-29 DIAGNOSIS — I2699 Other pulmonary embolism without acute cor pulmonale: Secondary | ICD-10-CM | POA: Diagnosis not present

## 2019-12-29 DIAGNOSIS — I825Y2 Chronic embolism and thrombosis of unspecified deep veins of left proximal lower extremity: Secondary | ICD-10-CM | POA: Diagnosis not present

## 2019-12-29 LAB — POCT INR: INR: 2.5 (ref 2.0–3.0)

## 2019-12-29 MED FILL — WARFARIN NA 2.5 MG TAB: 2.5 | 30 days supply | Qty: 30 | Fill #0

## 2020-01-09 MED FILL — ARIPiprazole 5 MG TABS: 5 | 30 days supply | Qty: 30 | Fill #0

## 2020-01-12 ENCOUNTER — Other Ambulatory Visit: Payer: Self-pay

## 2020-01-12 ENCOUNTER — Ambulatory Visit: Payer: Medicare Other | Attending: Family Medicine | Admitting: Family Medicine

## 2020-01-12 ENCOUNTER — Encounter: Payer: Self-pay | Admitting: Family Medicine

## 2020-01-12 DIAGNOSIS — Z9119 Patient's noncompliance with other medical treatment and regimen: Secondary | ICD-10-CM | POA: Diagnosis not present

## 2020-01-12 DIAGNOSIS — Z794 Long term (current) use of insulin: Secondary | ICD-10-CM

## 2020-01-12 DIAGNOSIS — I825Y2 Chronic embolism and thrombosis of unspecified deep veins of left proximal lower extremity: Secondary | ICD-10-CM

## 2020-01-12 DIAGNOSIS — F3162 Bipolar disorder, current episode mixed, moderate: Secondary | ICD-10-CM | POA: Diagnosis not present

## 2020-01-12 DIAGNOSIS — E1165 Type 2 diabetes mellitus with hyperglycemia: Secondary | ICD-10-CM

## 2020-01-12 DIAGNOSIS — E1149 Type 2 diabetes mellitus with other diabetic neurological complication: Secondary | ICD-10-CM | POA: Diagnosis not present

## 2020-01-12 DIAGNOSIS — Z91199 Patient's noncompliance with other medical treatment and regimen due to unspecified reason: Secondary | ICD-10-CM

## 2020-01-12 MED ORDER — TRAMADOL HCL 50 MG PO TABS: 50.0000 mg | ORAL_TABLET | Freq: Two times a day (BID) | ORAL | 2 refills | Status: DC | PRN

## 2020-01-12 MED ORDER — TRULICITY 1.5 MG/0.5ML ~~LOC~~ SOAJ: 1.5000 mg | SUBCUTANEOUS | 3 refills | Status: DC

## 2020-01-12 MED FILL — TRULICITY 1.5 MG/0.5 ML PEN: 1.5 | 28 days supply | Qty: 2 | Fill #0

## 2020-01-12 NOTE — Progress Notes (Signed)
Pain in both legs below knees.

## 2020-01-12 NOTE — Progress Notes (Signed)
Virtual Visit via Telephone Note  I connected with Bradley [name], on 01/12/2020 at 11:18 AM by telephone due to the COVID-19 pandemic and verified that I am speaking with the correct person using two identifiers.   Consent: I discussed the limitations, risks, security and privacy concerns of performing an evaluation and management service by telephone and the availability of in person appointments. I also discussed with the patient that there may be a patient responsible charge related to this service. The patient expressed understanding and agreed to proceed.   Location of Patient: Home  Location of Provider: Clinic   Persons participating in Telemedicine visit: Bradley [name] Alicia Farrington-CMA Dr.      History of Present Illness: Bradley [name] is a 38-year-old male with a history of morbid obesity, type 2 diabetes mellitus (A1c 14.9), non compliance,diabetic neuropathy, Bipolar disorder, previous DVT in 2017, PE (diagnosed in 10/2018 - on chronic anticoagulation with Coumadin).   Blood sugars are 375-440 and he is on 35 units of Lantus and states he is working himself up to 50 units He is not on Metformin due to Diarrhea Bipolar disorder is managed by [facility] and he is on Abilify, Cymbalta. Last INR was 2.5 on 12/29/19 Smokes 1 ppd and he is not ready to quit. He has no additional concerns today.  Past Medical History:  Diagnosis Date  . ADHD   . ADHD (attention deficit hyperactivity disorder)   . Bipolar 1 disorder (HCC)   . Bipolar disorder (HCC)   . Diabetes mellitus without complication (HCC)   . Morbid obesity (HCC)   . Obesity   . Panic attack   . Varicose veins of both lower extremities with pain    Allergies  Allergen Reactions  . Gabapentin Other (See Comments)    Crying spells  . Lyrica [Pregabalin] Other (See Comments)    Makes the patient somnolent    Current Outpatient Medications on File Prior to Visit   Medication Sig Dispense Refill  . albuterol (VENTOLIN HFA) 108 (90 Base) MCG/ACT inhaler Inhale 1-2 puffs into the lungs every 6 (six) hours as needed for wheezing or shortness of breath. 18 g 2  . ARIPiprazole (ABILIFY) 5 MG tablet Take 5 mg by mouth daily.    . Blood Glucose Monitoring Suppl (ONETOUCH VERIO) w/Device KIT Use to check blood sugar up to 3 times daily (E11.69, Z79.4) 1 kit 0  . busPIRone (BUSPAR) 7.5 MG tablet Take 7.5 mg by mouth 2 (two) times daily.    . doxepin (SINEQUAN) 25 MG capsule Take 25 mg by mouth at bedtime.    . Dulaglutide (TRULICITY) 0.75 MG/0.5ML SOPN Inject 0.75 mg into the skin once a week. 4 pen 0  . DULoxetine (CYMBALTA) 30 MG capsule Take 30 mg by mouth daily.    . DULoxetine (CYMBALTA) 60 MG capsule Take 90 mg by mouth daily.    . glipiZIDE (GLUCOTROL) 10 MG tablet Take 1 tablet (10 mg total) by mouth 2 (two) times daily before a meal. 60 tablet 3  . glucose blood (ONETOUCH VERIO) test strip Use to check blood sugar up to 3 times daily (E11.69, Z79.4) 100 each 11  . Insulin Glargine (BASAGLAR KWIKPEN) 100 UNIT/ML Inject 0.5 mLs (50 Units total) into the skin at bedtime. 60 mL 0  . Insulin Pen Needle (TRUEPLUS PEN NEEDLES) 32G X 4 MM MISC Use as directed to administer victoza 100 each 3  . Menthol (ICY HOT ADVANCED RELIEF) 7.5 % PTCH Apply   1 patch topically as needed (pain).    . metFORMIN (GLUCOPHAGE) 500 MG tablet Take 2 tablets (1,000 mg total) by mouth 2 (two) times daily with a meal. 120 tablet 3  . OneTouch Delica Lancets 33G MISC Use to check blood sugar up to 3 times daily (E11.69, Z79.4) 100 each 11  . traMADol (ULTRAM) 50 MG tablet Take 1 tablet (50 mg total) by mouth every 12 (twelve) hours as needed (for pain). 60 tablet 0  . traZODone (DESYREL) 50 MG tablet     . warfarin (COUMADIN) 2.5 MG tablet TAKE AS DIRECTED BY THE COUMADIN CLINIC. 30 tablet 2   No current facility-administered medications on file prior to visit.     Observations/Objective: Awake, alert, oriented x3 Not in acute distress  Lab Results  Component Value Date   HGBA1C 14.9 (A) 12/27/2019    Assessment and Plan: 1. Type 2 diabetes mellitus with hyperglycemia, with long-term current use of insulin (HCC) Uncontrolled with A1c of 14.9; goal is less than 7 Noncompliance playing a role in poor control He is up titrating his Lantus dose as instructed to a goal of 50 units nightly I have increased Trulicity from 0.75 mg to 1.5 mg Continue glipizide Unable to tolerate Metformin which I have discontinued Strongly encouraged to adhere to a diabetic diet and lifestyle modifications. - Dulaglutide (TRULICITY) 1.5 MG/0.5ML SOPN; Inject 1.5 mg into the skin once a week.  Dispense: 4 pen; Refill: 3  2. Other diabetic neurological complication associated with type 2 diabetes mellitus (HCC) Unable to tolerate Lyrica, Gabapentin Stable - traMADol (ULTRAM) 50 MG tablet; Take 1 tablet (50 mg total) by mouth every 12 (twelve) hours as needed (for pain).  Dispense: 60 tablet; Refill: 2  3. Non-compliance Discussed effects of non compliance and he will be working on being more compliant  4. Bipolar disorder, current episode mixed, moderate (HCC) Controlled Follow up with Pscy  5. Chronic deep vein thrombosis (DVT) of proximal vein of left lower extremity (HCC) On lifelong anticoagulation Last INR was therapeutic   Follow Up Instructions: 3 months   I discussed the assessment and treatment plan with the patient. The patient was provided an opportunity to ask questions and all were answered. The patient agreed with the plan and demonstrated an understanding of the instructions.   The patient was advised to call back or seek an in-person evaluation if the symptoms worsen or if the condition fails to improve as anticipated.     I provided 16 minutes total of non-face-to-face time during this encounter including median intraservice time,  reviewing previous notes, investigations, ordering medications, medical decision making, coordinating care and patient verbalized understanding at the end of the visit.      , MD, FAAFP. [facility] [place], [place] [phone]   01/12/2020, 11:18 AM  

## 2020-01-23 MED FILL — WARFARIN NA 2.5 MG TAB: 2.5 | 30 days supply | Qty: 30 | Fill #1

## 2020-01-26 DIAGNOSIS — F411 Generalized anxiety disorder: Secondary | ICD-10-CM | POA: Diagnosis not present

## 2020-01-26 DIAGNOSIS — F4312 Post-traumatic stress disorder, chronic: Secondary | ICD-10-CM | POA: Diagnosis not present

## 2020-01-26 DIAGNOSIS — F3132 Bipolar disorder, current episode depressed, moderate: Secondary | ICD-10-CM | POA: Diagnosis not present

## 2020-01-26 MED FILL — traMADol HCL 50 MG TABS: 50 | 7 days supply | Qty: 14 | Fill #0

## 2020-01-31 MED FILL — DULoxetine HCL 60 MG CPEP: 60 | 30 days supply | Qty: 30 | Fill #1

## 2020-02-02 ENCOUNTER — Ambulatory Visit: Payer: Medicare Other | Attending: Family Medicine | Admitting: Pharmacist

## 2020-02-02 ENCOUNTER — Other Ambulatory Visit: Payer: Self-pay

## 2020-02-02 DIAGNOSIS — I825Y2 Chronic embolism and thrombosis of unspecified deep veins of left proximal lower extremity: Secondary | ICD-10-CM | POA: Insufficient documentation

## 2020-02-02 DIAGNOSIS — I2699 Other pulmonary embolism without acute cor pulmonale: Secondary | ICD-10-CM | POA: Insufficient documentation

## 2020-02-02 LAB — POCT INR: INR: 3.3 — AB (ref 2.0–3.0)

## 2020-02-02 MED FILL — traMADol HCL 50 MG TABS: 50 | 7 days supply | Qty: 14 | Fill #1

## 2020-02-15 DIAGNOSIS — F4312 Post-traumatic stress disorder, chronic: Secondary | ICD-10-CM | POA: Diagnosis not present

## 2020-02-15 DIAGNOSIS — F3132 Bipolar disorder, current episode depressed, moderate: Secondary | ICD-10-CM | POA: Diagnosis not present

## 2020-02-15 DIAGNOSIS — F411 Generalized anxiety disorder: Secondary | ICD-10-CM | POA: Diagnosis not present

## 2020-02-15 MED FILL — traMADol HCL 50 MG TABS: 50 | 7 days supply | Qty: 14 | Fill #3

## 2020-02-16 ENCOUNTER — Ambulatory Visit: Payer: Medicare Other | Attending: Family Medicine | Admitting: Pharmacist

## 2020-02-16 ENCOUNTER — Other Ambulatory Visit: Payer: Self-pay

## 2020-02-16 DIAGNOSIS — I825Y2 Chronic embolism and thrombosis of unspecified deep veins of left proximal lower extremity: Secondary | ICD-10-CM | POA: Insufficient documentation

## 2020-02-16 DIAGNOSIS — I2699 Other pulmonary embolism without acute cor pulmonale: Secondary | ICD-10-CM | POA: Diagnosis not present

## 2020-02-16 LAB — POCT INR: INR: 2.4 (ref 2.0–3.0)

## 2020-02-16 MED FILL — DOXEPIN 25 MG CAPSULE: 25 | 30 days supply | Qty: 30 | Fill #0

## 2020-02-16 MED FILL — DULoxetine HCL 30 MG CPEP: 30 | 30 days supply | Qty: 30 | Fill #0

## 2020-02-16 MED FILL — busPIRone HCL 7.5 MG TABS: 7.5 | 30 days supply | Qty: 60 | Fill #0

## 2020-02-22 MED FILL — traMADol HCL 50 MG TABS: 50 | 7 days supply | Qty: 14 | Fill #4

## 2020-02-22 MED FILL — glipiZIDE 10 MG TABS: 10 | 30 days supply | Qty: 60 | Fill #1

## 2020-02-22 MED FILL — WARFARIN NA 2.5 MG TAB: 2.5 | 30 days supply | Qty: 30 | Fill #2

## 2020-03-01 MED FILL — DULoxetine HCL 60 MG CPEP: 60 | 30 days supply | Qty: 30 | Fill #0

## 2020-03-01 MED FILL — traMADol HCL 50 MG TABS: 50 | 7 days supply | Qty: 14 | Fill #5

## 2020-03-05 DIAGNOSIS — F411 Generalized anxiety disorder: Secondary | ICD-10-CM | POA: Diagnosis not present

## 2020-03-05 DIAGNOSIS — F3132 Bipolar disorder, current episode depressed, moderate: Secondary | ICD-10-CM | POA: Diagnosis not present

## 2020-03-05 DIAGNOSIS — F4312 Post-traumatic stress disorder, chronic: Secondary | ICD-10-CM | POA: Diagnosis not present

## 2020-03-08 ENCOUNTER — Ambulatory Visit: Payer: Medicare Other | Attending: Family Medicine | Admitting: Pharmacist

## 2020-03-08 ENCOUNTER — Other Ambulatory Visit: Payer: Self-pay

## 2020-03-08 DIAGNOSIS — Z7901 Long term (current) use of anticoagulants: Secondary | ICD-10-CM | POA: Insufficient documentation

## 2020-03-08 DIAGNOSIS — I2699 Other pulmonary embolism without acute cor pulmonale: Secondary | ICD-10-CM | POA: Diagnosis not present

## 2020-03-08 DIAGNOSIS — I825Y2 Chronic embolism and thrombosis of unspecified deep veins of left proximal lower extremity: Secondary | ICD-10-CM | POA: Insufficient documentation

## 2020-03-08 LAB — POCT INR: INR: 2.2 (ref 2.0–3.0)

## 2020-03-12 DIAGNOSIS — F4312 Post-traumatic stress disorder, chronic: Secondary | ICD-10-CM | POA: Diagnosis not present

## 2020-03-12 DIAGNOSIS — F3132 Bipolar disorder, current episode depressed, moderate: Secondary | ICD-10-CM | POA: Diagnosis not present

## 2020-03-12 DIAGNOSIS — F411 Generalized anxiety disorder: Secondary | ICD-10-CM | POA: Diagnosis not present

## 2020-03-14 IMAGING — DX LEFT TIBIA AND FIBULA - 2 VIEW
4 series · 4 of 4 positions shown · non-contrast
Comparison: None.

CLINICAL DATA: Large wound to the anterior left leg near the
midshaft tibia. Wound is roughly 2-3 inches in diameter. Wound began
as a scratch from the pt's dog x2 weeks ago. He has completed a
round of antibiotics. Hx of DM. Smoker.

EXAM:
LEFT TIBIA AND FIBULA - 2 VIEW

[x tib-fib ap left (1 of 2)]
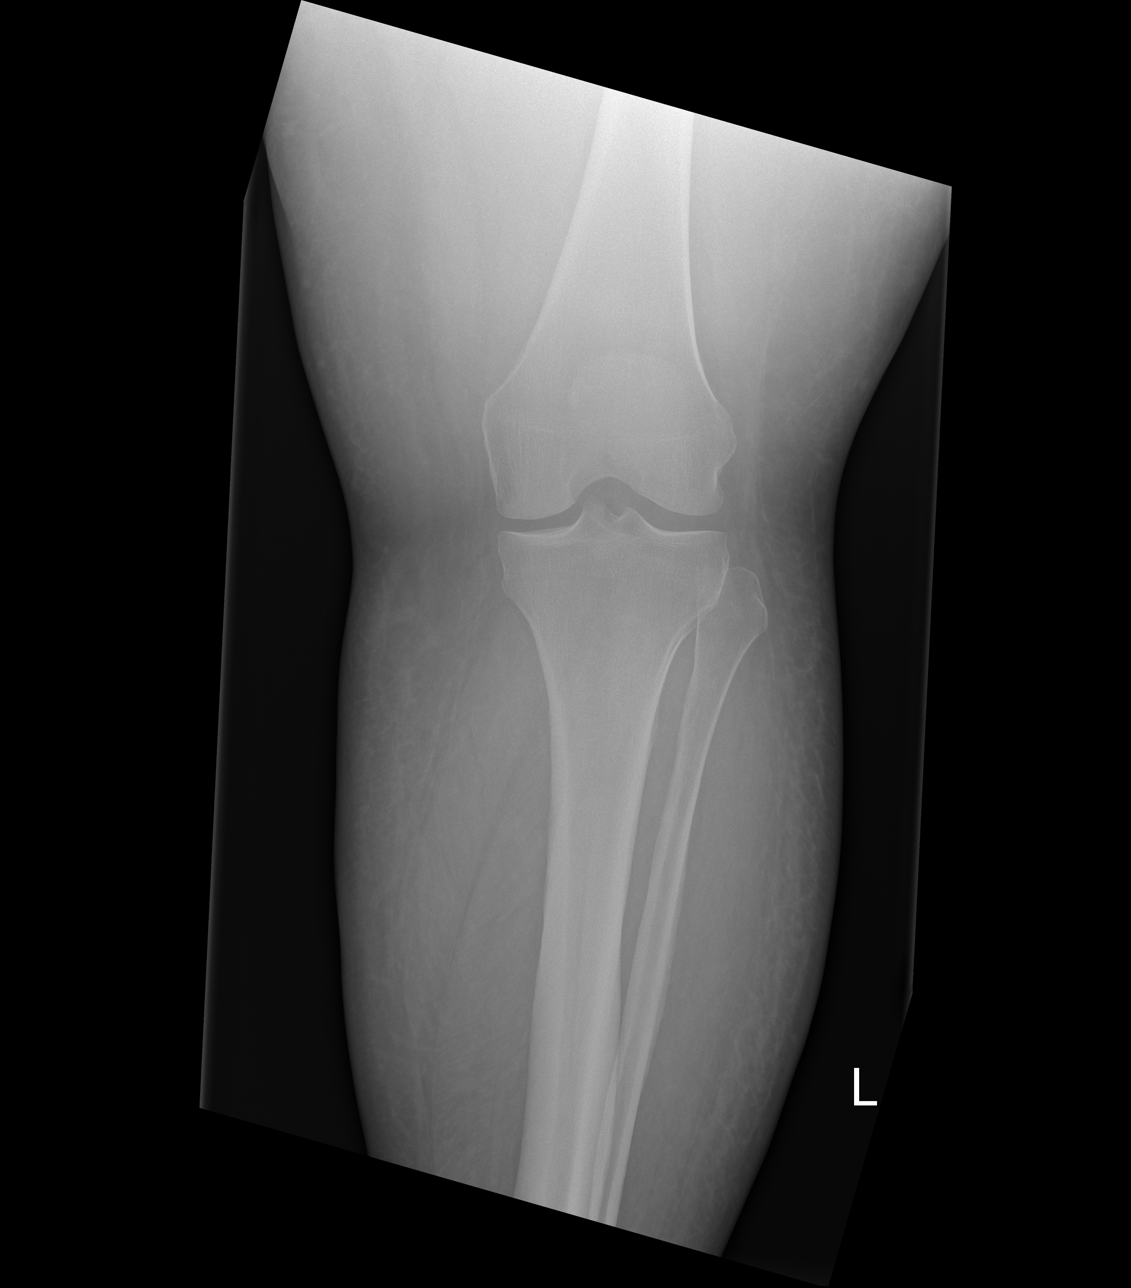

[x tib-fib ap left (2 of 2)]
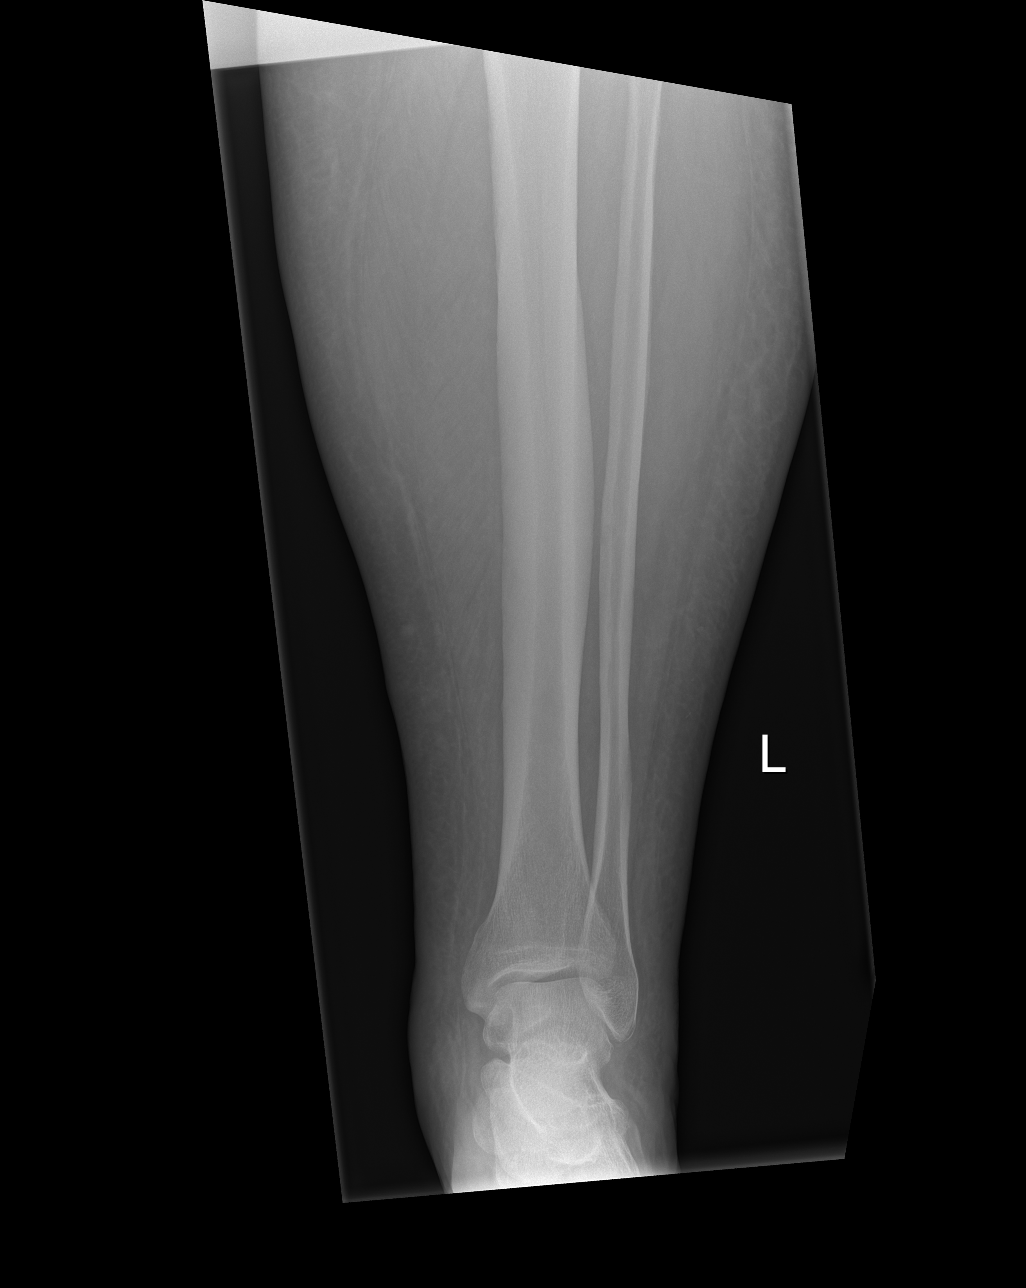

[x tib-fib lat left (1 of 2)]
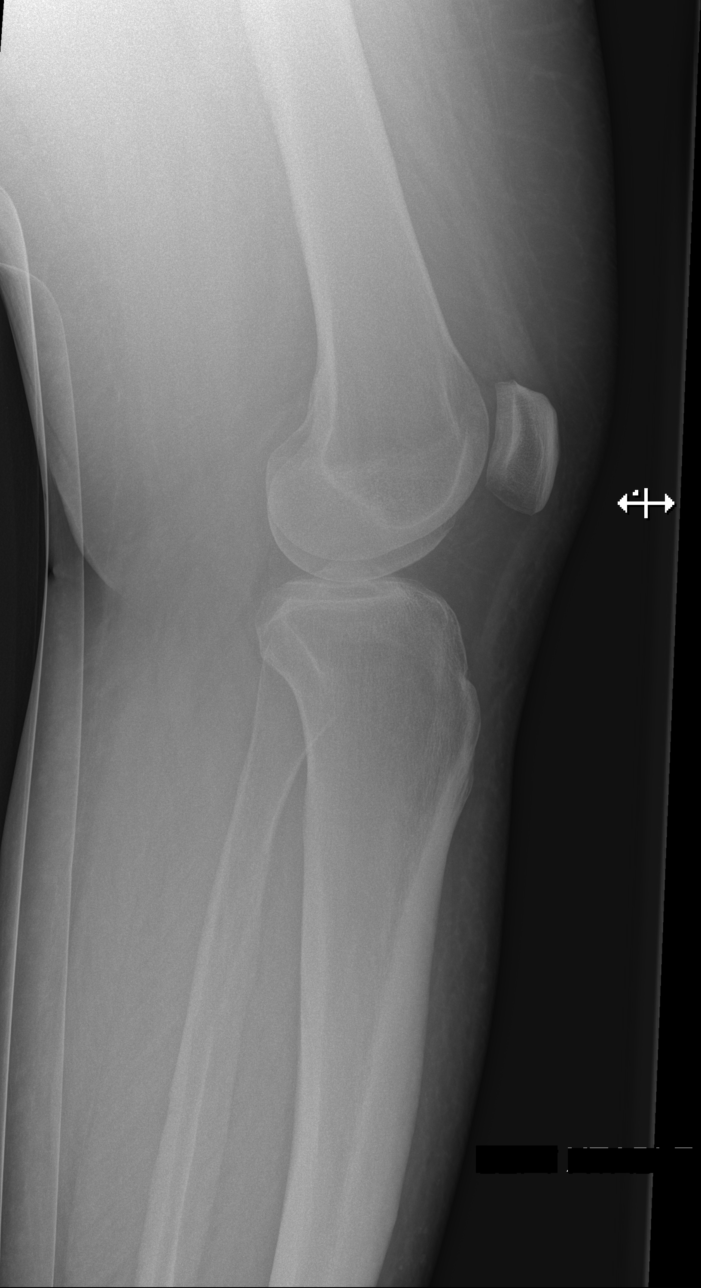

[x tib-fib lat left (2 of 2)]
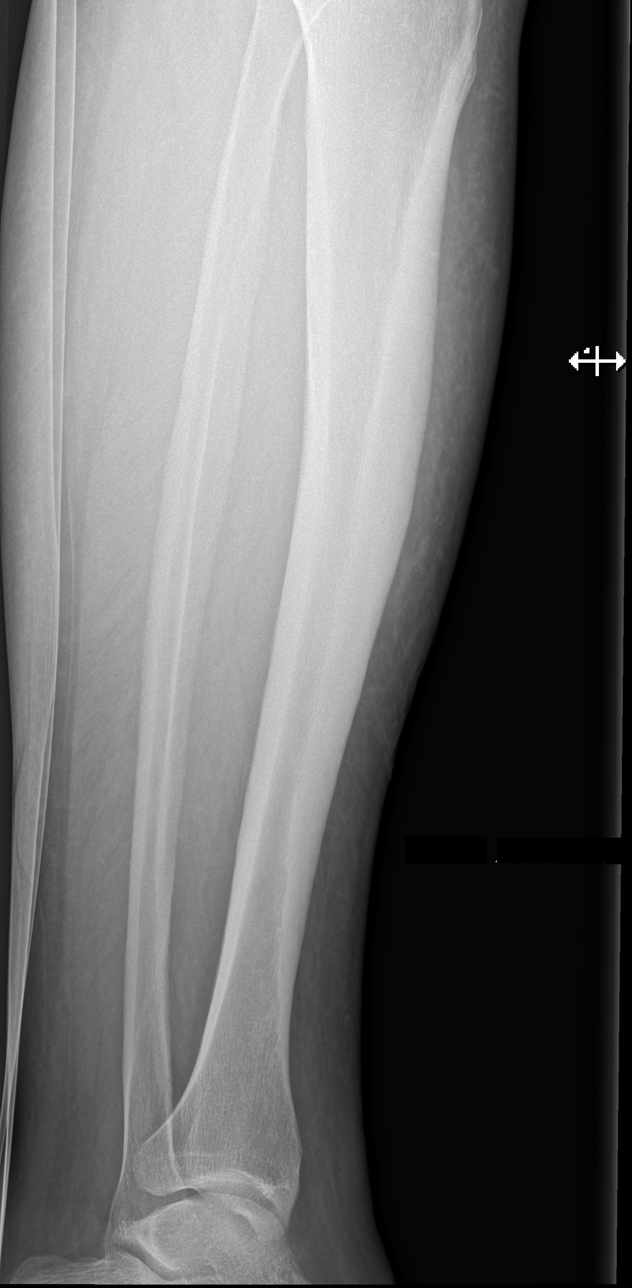

[4 of 4 positions shown; findings below may reference images not displayed]

FINDINGS: There is diffuse soft tissue swelling of the LOWER leg. No acute
fracture or subluxation. No radiopaque foreign body or soft tissue
gas. Anterior tibial cortical thickening is consistent with remote
injury.
IMPRESSION: 1. Soft tissue swelling.
2. No acute fracture.

## 2020-03-14 MED FILL — traMADol HCL 50 MG TABS: 50 | 30 days supply | Qty: 60 | Fill #7

## 2020-03-28 ENCOUNTER — Other Ambulatory Visit: Payer: Self-pay | Admitting: Pharmacist

## 2020-03-28 MED ORDER — WARFARIN SODIUM 2.5 MG PO TABS: ORAL_TABLET | ORAL | 2 refills | Status: DC

## 2020-03-28 MED FILL — DULoxetine HCL 30 MG CPEP: 30 | 30 days supply | Qty: 30 | Fill #1

## 2020-03-28 MED FILL — WARFARIN NA 2.5 MG TAB: 2.5 | 30 days supply | Qty: 30 | Fill #0

## 2020-03-28 MED FILL — DULoxetine HCL 60 MG CPEP: 60 | 30 days supply | Qty: 30 | Fill #1

## 2020-03-30 ENCOUNTER — Ambulatory Visit: Payer: Medicare Other | Admitting: Podiatry

## 2020-04-09 DIAGNOSIS — F3132 Bipolar disorder, current episode depressed, moderate: Secondary | ICD-10-CM | POA: Diagnosis not present

## 2020-04-09 DIAGNOSIS — F4312 Post-traumatic stress disorder, chronic: Secondary | ICD-10-CM | POA: Diagnosis not present

## 2020-04-09 DIAGNOSIS — F411 Generalized anxiety disorder: Secondary | ICD-10-CM | POA: Diagnosis not present

## 2020-04-09 IMAGING — US VENOUS DOPPLER ULTRASOUND OF  LOWER EXTREMITIES
1 series · 12 of 24 positions shown · non-contrast
Comparison: None.

CLINICAL DATA: 37-year-old male with a history DVT and venous
disease



[Series 1: venous doppler ultrasound of lower extremities · 0.11mm/px · 12 of 53 slices shown]
[im 3/53]
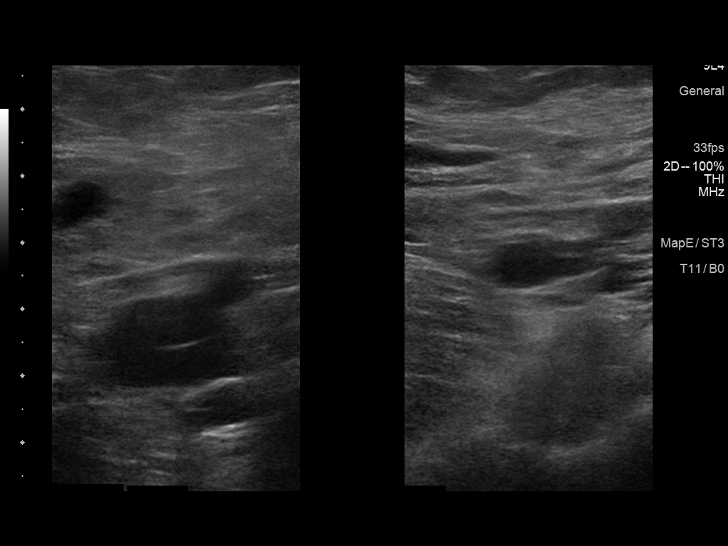
[im 7/53]
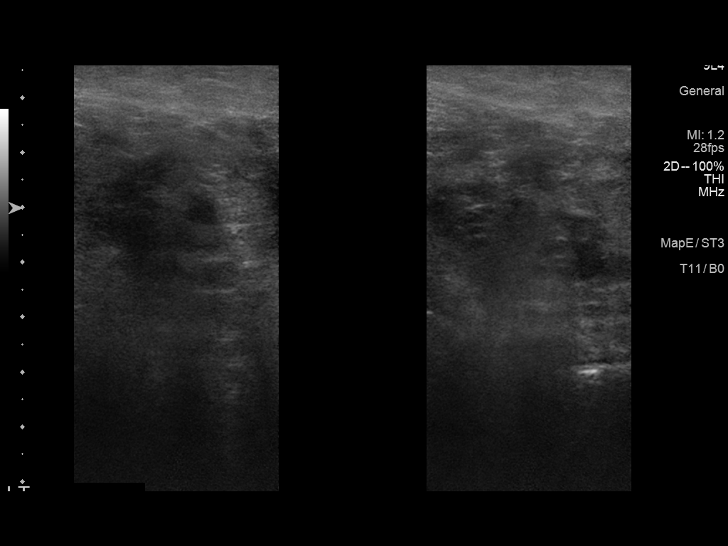
[im 12/53]
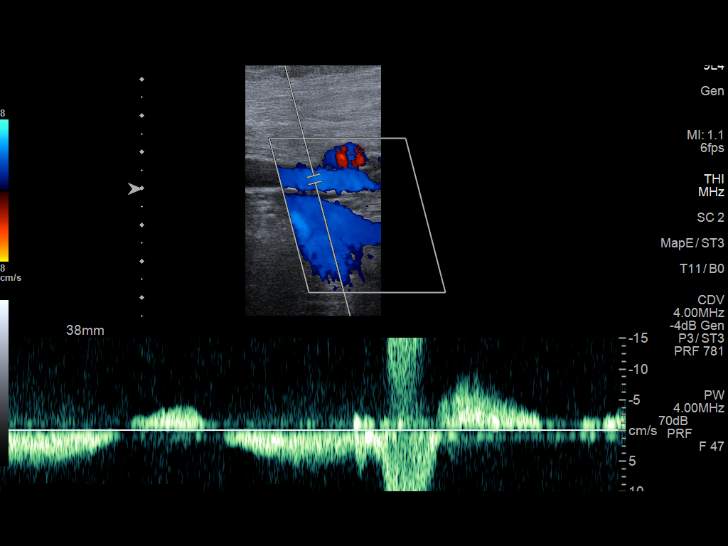
[im 16/53]
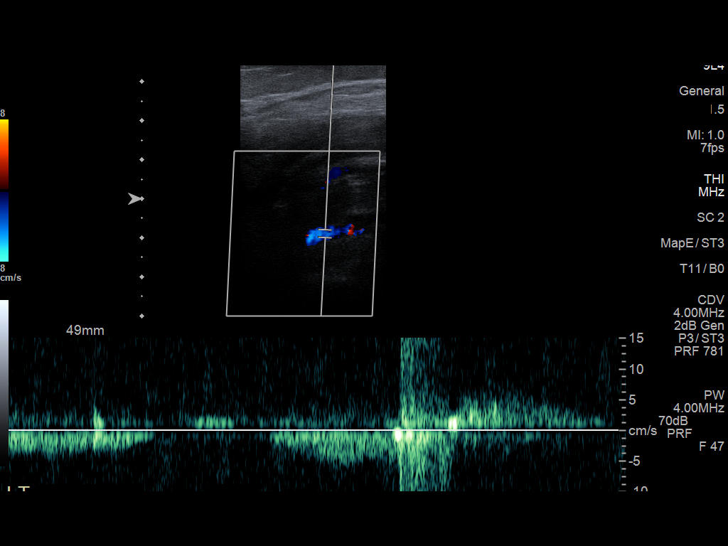
[im 21/53]
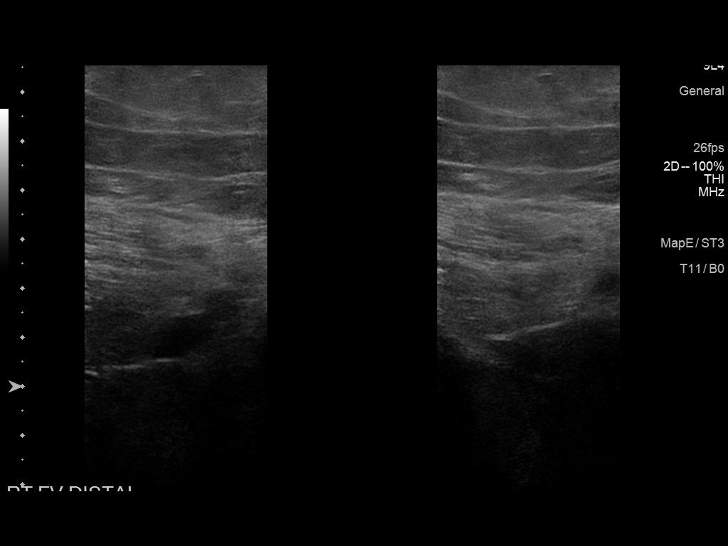
[im 25/53]
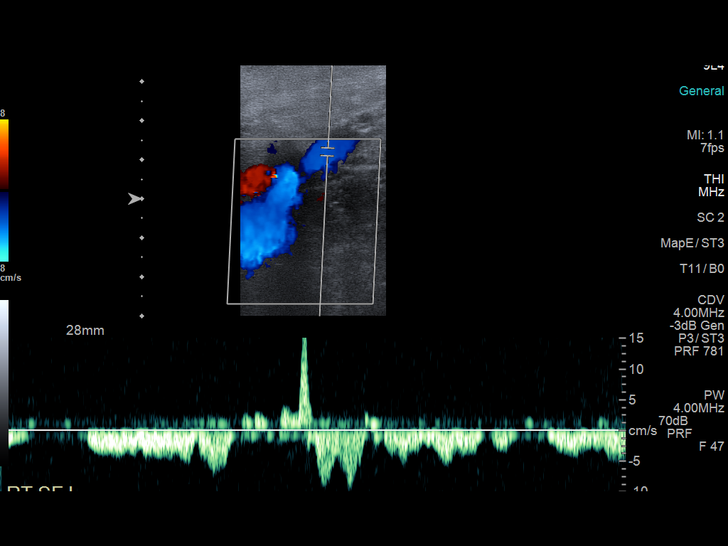
[im 30/53]
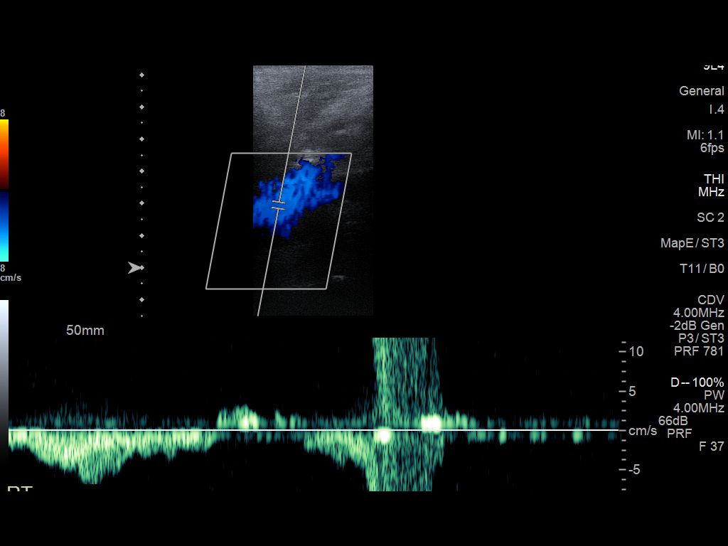
[im 34/53]
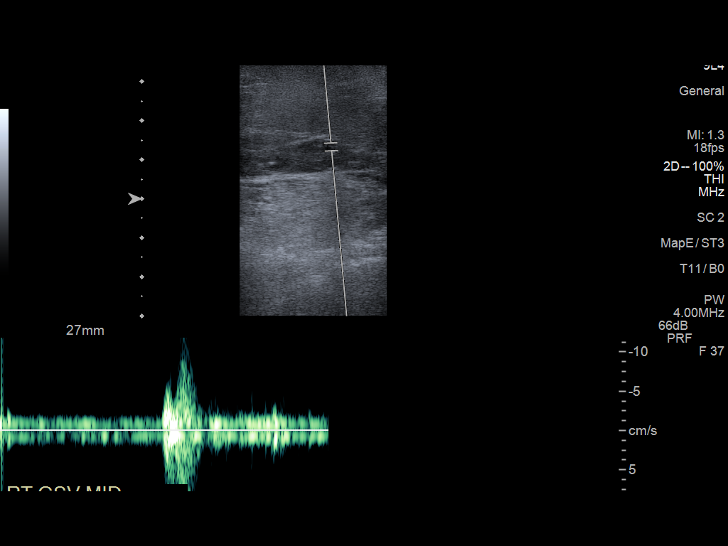
[im 39/53]
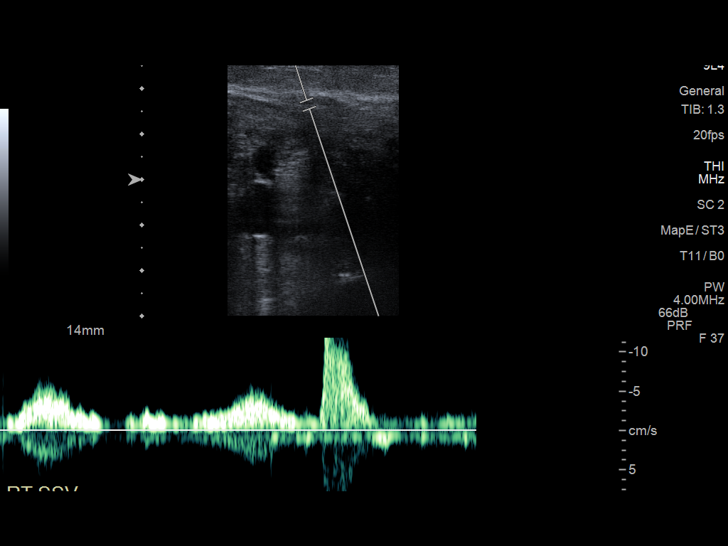
[im 43/53]
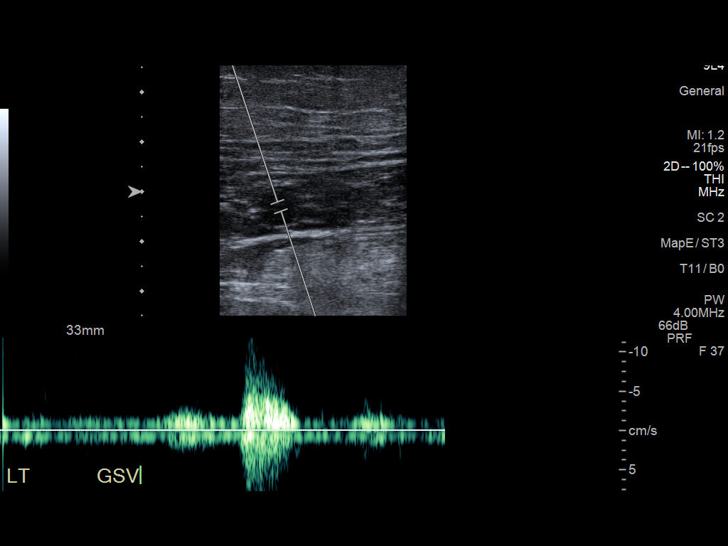
[im 48/53]
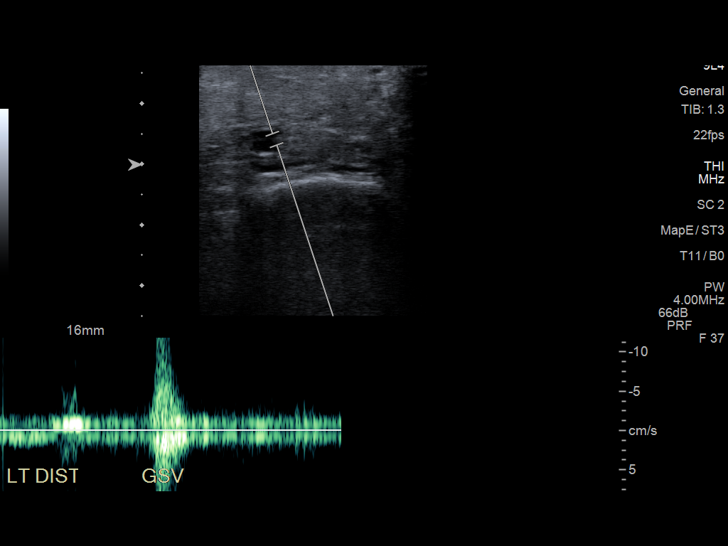
[im 53/53]
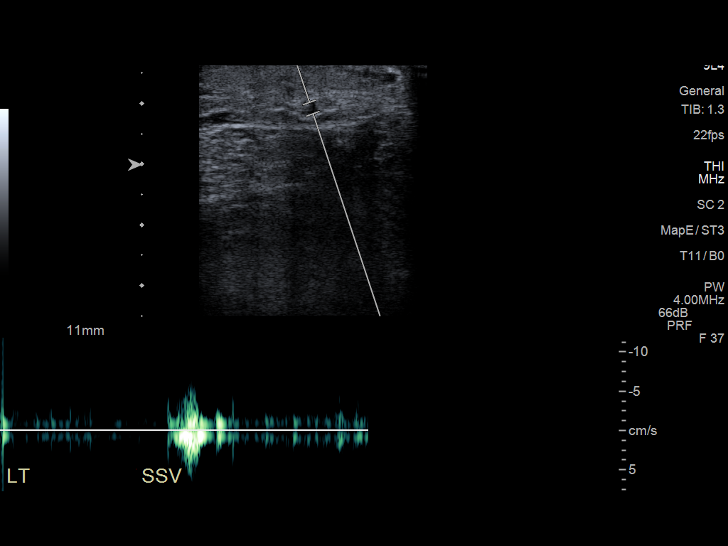

[12 of 24 positions shown; findings below may reference images not displayed]

FINDINGS: RIGHT LOWER EXTREMITY

Common Femoral Vein: No evidence of thrombus. Normal
compressibility, respiratory phasicity and response to augmentation.

Saphenofemoral Junction: No evidence of thrombus. Normal
compressibility and flow on color Doppler imaging.

Profunda Femoral Vein: No evidence of thrombus. Normal
compressibility and flow on color Doppler imaging.

Femoral Vein: No evidence of thrombus. Normal compressibility,
respiratory phasicity and response to augmentation.

Popliteal Vein: No evidence of thrombus. Normal compressibility,
respiratory phasicity and response to augmentation.

Calf Veins: No evidence of thrombus. Normal compressibility and flow
on color Doppler imaging.

GSV at SFJ:  No reflux

GSV at Proximal Thigh:  No reflux

GSV at Mid Thigh:  No reflux

GSV at Distal Thigh:  No reflux

GSV at Prox Calf:  No reflux

GSV at Mid Calf:  No reflux

GSV at Distal Calf:  No reflux

SSV at Sapheno popliteal junction:  No reflux

SSV at mid calf:  No reflux

SSF at distal calf:  No reflux

Varicosities:  Small varicosities of the right lower extremity.

Other Findings:  Edema

LEFT LOWER EXTREMITY

Common Femoral Vein: No evidence of thrombus. Normal
compressibility, respiratory phasicity and response to augmentation.

Saphenofemoral Junction: No evidence of thrombus. Normal
compressibility and flow on color Doppler imaging.

Profunda Femoral Vein: No evidence of thrombus. Normal
compressibility and flow on color Doppler imaging.

Femoral Vein: No evidence of thrombus. Normal compressibility,
respiratory phasicity and response to augmentation.

Popliteal Vein: No evidence of thrombus. Normal compressibility,
respiratory phasicity and response to augmentation.

Calf Veins: No evidence of thrombus. Normal compressibility and flow
on color Doppler imaging.

GSV at SFJ:  No reflux

GSV at Proximal Thigh:  No reflux

GSV at Mid Thigh:  No reflux

GSV at Distal Thigh:  No reflux

GSV at Prox Calf:  No reflux

GSV at Mid Calf:  No reflux

GSV at Distal Calf:  No reflux

SSV at Sapheno popliteal junction:  No reflux

SSV at mid calf:  No reflux

SSF at distal calf:  No reflux

Varicosities: Varicose veins of the left lower extremity which
appear to flow from the deep system to the superficial system given
the absence of reflux in the truncal veins. Enlarged perforator at
the medial distal calf

Other Findings: Reflux of the deep left femoral venous system and
popliteal vein

Edema.
IMPRESSION: Sonographic survey of the bilateral lower extremities negative for
occlusive DVT.

Evidence of chronic left venous changes compatible with prior
history of DVT.

Duplex is negative for significant truncal vein venous
insufficiency, with evidence of left-sided deep vein reflux.

## 2020-04-09 MED FILL — ARIPiprazole 5 MG TABS: 5 | 30 days supply | Qty: 30 | Fill #1

## 2020-04-12 ENCOUNTER — Other Ambulatory Visit: Payer: Self-pay

## 2020-04-12 ENCOUNTER — Telehealth: Payer: Self-pay | Admitting: Family Medicine

## 2020-04-12 ENCOUNTER — Ambulatory Visit: Payer: Medicare Other | Attending: Family Medicine | Admitting: Pharmacist

## 2020-04-12 DIAGNOSIS — I2699 Other pulmonary embolism without acute cor pulmonale: Secondary | ICD-10-CM

## 2020-04-12 DIAGNOSIS — E1149 Type 2 diabetes mellitus with other diabetic neurological complication: Secondary | ICD-10-CM

## 2020-04-12 DIAGNOSIS — I825Y2 Chronic embolism and thrombosis of unspecified deep veins of left proximal lower extremity: Secondary | ICD-10-CM | POA: Diagnosis not present

## 2020-04-12 LAB — POCT INR: INR: 3 (ref 2.0–3.0)

## 2020-04-12 MED FILL — traMADol HCL 50 MG TABS: 50 | 11 days supply | Qty: 22 | Fill #8

## 2020-04-12 NOTE — Telephone Encounter (Signed)
Patient stopped at the front desk and requested for a refill on his tramadol. Patient stated that he was only given a partial fill due to the way the script was written. Please follow up at your earliest convenience.

## 2020-04-13 MED ORDER — TRAMADOL HCL 50 MG PO TABS: 50.0000 mg | ORAL_TABLET | Freq: Two times a day (BID) | ORAL | 2 refills | Status: DC | PRN

## 2020-04-13 NOTE — Telephone Encounter (Signed)
Once he is done with his current prescription he will have refills at the pharmacy.

## 2020-04-17 NOTE — Telephone Encounter (Signed)
Patient was called. Was verified by 2 patient identifiers.Patient was informed of refills for tramadol. Patient verbalized understanding and had no further questions or concerns at this time.

## 2020-04-19 ENCOUNTER — Emergency Department (HOSPITAL_COMMUNITY)
Admission: EM | Admit: 2020-04-19 | Discharge: 2020-04-20 | Disposition: A | Discharge status: Home / Self Care | Payer: Medicare Other | Attending: Emergency Medicine | Admitting: Emergency Medicine

## 2020-04-19 ENCOUNTER — Encounter (HOSPITAL_COMMUNITY): Payer: Self-pay | Admitting: Emergency Medicine

## 2020-04-19 ENCOUNTER — Other Ambulatory Visit: Payer: Self-pay

## 2020-04-19 DIAGNOSIS — R195 Other fecal abnormalities: Secondary | ICD-10-CM | POA: Diagnosis not present

## 2020-04-19 DIAGNOSIS — Z5321 Procedure and treatment not carried out due to patient leaving prior to being seen by health care provider: Secondary | ICD-10-CM | POA: Diagnosis not present

## 2020-04-19 DIAGNOSIS — R109 Unspecified abdominal pain: Secondary | ICD-10-CM | POA: Diagnosis not present

## 2020-04-19 NOTE — ED Triage Notes (Signed)
Pt reports having "a lot of blood" in his bm at 8:30 tonight.  He also reports abdominal cramping since that episode.  Is on blood thinners.

## 2020-04-20 LAB — COMPREHENSIVE METABOLIC PANEL
ALT: 46 U/L — ABNORMAL HIGH (ref 0–44)
AST: 29 U/L (ref 15–41)
Albumin: 3.9 g/dL (ref 3.5–5.0)
Alkaline Phosphatase: 124 U/L (ref 38–126)
Anion gap: 11 (ref 5–15)
BUN: <5 mg/dL — ABNORMAL LOW (ref 6–20)
CO2: 28 mmol/L (ref 22–32)
Calcium: 10 mg/dL (ref 8.9–10.3)
Chloride: 94 mmol/L — ABNORMAL LOW (ref 98–111)
Creatinine, Ser: 0.81 mg/dL (ref 0.61–1.24)
GFR calc Af Amer: >60 mL/min (ref >60)
GFR calc non Af Amer: >60 mL/min (ref >60)
Glucose, Bld: 445 mg/dL — ABNORMAL HIGH (ref 70–99)
Potassium: 4.4 mmol/L (ref 3.5–5.1)
Sodium: 133 mmol/L — ABNORMAL LOW (ref 135–145)
Total Bilirubin: 0.7 mg/dL (ref 0.3–1.2)
Total Protein: 7.6 g/dL (ref 6.5–8.1)

## 2020-04-20 LAB — CBC
HCT: 54.1 % — ABNORMAL HIGH (ref 39.0–52.0)
Hemoglobin: 17.8 g/dL — ABNORMAL HIGH (ref 13.0–17.0)
MCH: 28.3 pg (ref 26.0–34.0)
MCHC: 32.9 g/dL (ref 30.0–36.0)
MCV: 86 fL (ref 80.0–100.0)
Platelets: 111 10*3/uL — ABNORMAL LOW (ref 150–400)
RBC: 6.29 MIL/uL — ABNORMAL HIGH (ref 4.22–5.81)
RDW: 13.3 % (ref 11.5–15.5)
WBC: 9.5 10*3/uL (ref 4.0–10.5)
nRBC: 0 % (ref 0.0–0.2)

## 2020-04-20 LAB — TYPE AND SCREEN
ABO/RH(D): A POS
Antibody Screen: NEGATIVE

## 2020-04-20 LAB — PROTIME-INR
INR: 2.3 — ABNORMAL HIGH (ref 0.8–1.2)
Prothrombin Time: 24.6 seconds — ABNORMAL HIGH (ref 11.4–15.2)

## 2020-04-20 NOTE — ED Notes (Signed)
Patient no longer wants to wait tonight, states he needs to go home to eat and take his medication. Patient states he will call his PCP in the morning. Encouraged to return to ED if needed.

## 2020-04-23 MED FILL — glipiZIDE 10 MG TABS: 10 | 30 days supply | Qty: 60 | Fill #2

## 2020-04-23 MED FILL — traMADol HCL 50 MG TABS: 50 | 30 days supply | Qty: 60 | Fill #0

## 2020-04-23 MED FILL — WARFARIN NA 2.5 MG TAB: 2.5 | 30 days supply | Qty: 30 | Fill #1

## 2020-04-30 DIAGNOSIS — F3132 Bipolar disorder, current episode depressed, moderate: Secondary | ICD-10-CM | POA: Diagnosis not present

## 2020-04-30 DIAGNOSIS — F4312 Post-traumatic stress disorder, chronic: Secondary | ICD-10-CM | POA: Diagnosis not present

## 2020-04-30 DIAGNOSIS — F411 Generalized anxiety disorder: Secondary | ICD-10-CM | POA: Diagnosis not present

## 2020-04-30 MED FILL — TRULICITY 1.5 MG/0.5 ML PEN: 1.5 | 28 days supply | Qty: 2 | Fill #1

## 2020-04-30 MED FILL — DULoxetine HCL 60 MG CPEP: 60 | 30 days supply | Qty: 30 | Fill #0

## 2020-04-30 MED FILL — DULoxetine HCL 30 MG CPEP: 30 | 30 days supply | Qty: 30 | Fill #0

## 2020-05-03 ENCOUNTER — Ambulatory Visit: Payer: Medicare Other | Attending: Family Medicine | Admitting: Family Medicine

## 2020-05-03 ENCOUNTER — Encounter: Payer: Self-pay | Admitting: Family Medicine

## 2020-05-03 ENCOUNTER — Other Ambulatory Visit: Payer: Self-pay

## 2020-05-03 ENCOUNTER — Other Ambulatory Visit: Payer: Self-pay | Admitting: Family Medicine

## 2020-05-03 VITALS — BP 108/69 | HR 98 | Ht 72.0 in | Wt 317.0 lb

## 2020-05-03 DIAGNOSIS — Z86718 Personal history of other venous thrombosis and embolism: Secondary | ICD-10-CM | POA: Insufficient documentation

## 2020-05-03 DIAGNOSIS — E1140 Type 2 diabetes mellitus with diabetic neuropathy, unspecified: Secondary | ICD-10-CM | POA: Diagnosis not present

## 2020-05-03 DIAGNOSIS — Z833 Family history of diabetes mellitus: Secondary | ICD-10-CM | POA: Insufficient documentation

## 2020-05-03 DIAGNOSIS — Z7901 Long term (current) use of anticoagulants: Secondary | ICD-10-CM | POA: Insufficient documentation

## 2020-05-03 DIAGNOSIS — F319 Bipolar disorder, unspecified: Secondary | ICD-10-CM | POA: Diagnosis not present

## 2020-05-03 DIAGNOSIS — Z888 Allergy status to other drugs, medicaments and biological substances status: Secondary | ICD-10-CM | POA: Insufficient documentation

## 2020-05-03 DIAGNOSIS — I2782 Chronic pulmonary embolism: Secondary | ICD-10-CM | POA: Insufficient documentation

## 2020-05-03 DIAGNOSIS — F909 Attention-deficit hyperactivity disorder, unspecified type: Secondary | ICD-10-CM | POA: Diagnosis not present

## 2020-05-03 DIAGNOSIS — Z79899 Other long term (current) drug therapy: Secondary | ICD-10-CM | POA: Insufficient documentation

## 2020-05-03 DIAGNOSIS — E1165 Type 2 diabetes mellitus with hyperglycemia: Secondary | ICD-10-CM | POA: Diagnosis not present

## 2020-05-03 DIAGNOSIS — Z9114 Patient's other noncompliance with medication regimen: Secondary | ICD-10-CM | POA: Diagnosis not present

## 2020-05-03 DIAGNOSIS — N528 Other male erectile dysfunction: Secondary | ICD-10-CM | POA: Insufficient documentation

## 2020-05-03 DIAGNOSIS — Z720 Tobacco use: Secondary | ICD-10-CM | POA: Diagnosis not present

## 2020-05-03 DIAGNOSIS — Z794 Long term (current) use of insulin: Secondary | ICD-10-CM | POA: Diagnosis not present

## 2020-05-03 DIAGNOSIS — E1149 Type 2 diabetes mellitus with other diabetic neurological complication: Secondary | ICD-10-CM

## 2020-05-03 LAB — POCT GLYCOSYLATED HEMOGLOBIN (HGB A1C): HbA1c, POC (controlled diabetic range): 13.2 % — AB (ref 0.0–7.0)

## 2020-05-03 LAB — POCT URINALYSIS DIP (CLINITEK)
Bilirubin, UA: NEGATIVE
Glucose, UA: 500 mg/dL — AB
Ketones, POC UA: NEGATIVE mg/dL
Leukocytes, UA: NEGATIVE
Nitrite, UA: NEGATIVE
POC PROTEIN,UA: NEGATIVE
Spec Grav, UA: 1.02 (ref 1.010–1.025)
Urobilinogen, UA: 0.2 E.U./dL
pH, UA: 6 (ref 5.0–8.0)

## 2020-05-03 LAB — GLUCOSE, POCT (MANUAL RESULT ENTRY)
POC Glucose: 451 mg/dl — AB (ref 70–99)
POC Glucose: 427 mg/dl — AB (ref 70–99)

## 2020-05-03 MED ORDER — TRULICITY 1.5 MG/0.5ML ~~LOC~~ SOAJ: 1.5000 mg | SUBCUTANEOUS | 3 refills | Status: DC

## 2020-05-03 MED ORDER — INSULIN ASPART 100 UNIT/ML ~~LOC~~ SOLN
15.0000 [IU] | Freq: Once | SUBCUTANEOUS | Status: AC
  Administered 2020-05-03: 15 [IU] via SUBCUTANEOUS

## 2020-05-03 MED ORDER — GLIPIZIDE 10 MG PO TABS: 20.0000 mg | ORAL_TABLET | Freq: Two times a day (BID) | ORAL | 3 refills | Status: DC

## 2020-05-03 MED ORDER — SILDENAFIL CITRATE 50 MG PO TABS: 50.0000 mg | ORAL_TABLET | Freq: Every day | ORAL | 1 refills | Status: DC | PRN

## 2020-05-03 NOTE — Progress Notes (Signed)
Subjective:  Patient ID: Bradley [name], male    DOB: [birth year]  Age: 39 y.o. MRN: [id number]  CC: Diabetes   HPI Bradley [name] is a 39 year old male with a history of morbid obesity, type 2 diabetes mellitus (A1c 13.2), non compliance,diabetic neuropathy, Bipolar disorder, previous DVT in 2017, PE (diagnosed in 10/2018 - on chronic anticoagulation with Coumadin).  He complains of erectile difficulty which has been present for the last year but has gotten worse over the last 3 months.  He is unable to obtain any form of erection per patient.  I had increased his Trulicity dose from 0.16 to 1.5 mg at his last office visit and he informs me stopped taking Basaglar for the last month  because it made him nauseous.  His blood sugar is 451 in the clinic today.,  Last meal was 1-1/2 hours ago.  Denies presence of blurry vision, abdominal pain, dizziness, fatigue. 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 He loves Trulicity and it is so easy to administer but does not want to remain on Basaglar. Up-to-date on his annual eye exam which he had in February of this year. Neuropathy is controlled on tramadol and his last dose was this morning.  Denies recreational drug use, PDMP reviewed.  Smokes 1 ppd and is not ready to quit. Compliant with Coumadin for his chronic PE and last INR was therapeutic at 2.3.  INR is monitored by the clinical pharmacist. Bipolar disorder is managed by psych at the neuropsychiatry center. He has no additional concerns today.  Past Medical History:  Diagnosis Date  . ADHD   . ADHD (attention deficit hyperactivity disorder)   . Bipolar 1 disorder ([place])   . Bipolar disorder ([place])   . Diabetes mellitus without complication ([place])   . Morbid obesity ([place])   . Obesity   . Panic attack   . Varicose veins of both lower extremities with pain     Past Surgical History:  Procedure Laterality Date  . surgery on meatus as a child       Family History  Problem Relation Age of Onset  . Rheum arthritis Mother   . Diabetes Mother   . Heart failure Mother   . Stroke Mother   . Glaucoma Maternal Grandmother     Allergies  Allergen Reactions  . Gabapentin Other (See Comments)    Crying spells  . Lyrica [Pregabalin] Other (See Comments)    Makes the patient somnolent    Outpatient Medications Prior to Visit  Medication Sig Dispense Refill  . albuterol (VENTOLIN HFA) 108 (90 Base) MCG/ACT inhaler Inhale 1-2 puffs into the lungs every 6 (six) hours as needed for wheezing or shortness of breath. 18 g 2  . ARIPiprazole (ABILIFY) 5 MG tablet Take 5 mg by mouth daily.    . Blood Glucose Monitoring Suppl (ONETOUCH VERIO) w/Device KIT Use to check blood sugar up to 3 times daily (E11.69, Z79.4) 1 kit 0  . busPIRone (BUSPAR) 7.5 MG tablet Take 7.5 mg by mouth 2 (two) times daily.    Marland Kitchen doxepin (SINEQUAN) 25 MG capsule Take 25 mg by mouth at bedtime.    . DULoxetine (CYMBALTA) 30 MG capsule Take 30 mg by mouth daily.    . DULoxetine (CYMBALTA) 60 MG capsule Take 90 mg by mouth daily.    Marland Kitchen glucose blood (ONETOUCH VERIO) test strip Use to check blood sugar up to 3 times daily (E11.69, Z79.4)  100 each 11  . Insulin Pen Needle (TRUEPLUS PEN NEEDLES) 32G X 4 MM MISC Use as directed to administer victoza 100 each 3  . Menthol (ICY HOT ADVANCED RELIEF) 7.5 % PTCH Apply 1 patch topically as needed (pain).    Glory Rosebush Delica Lancets 63J MISC Use to check blood sugar up to 3 times daily (E11.69, Z79.4) 100 each 11  . traMADol (ULTRAM) 50 MG tablet Take 1 tablet (50 mg total) by mouth every 12 (twelve) hours as needed (for pain). 60 tablet 2  . traZODone (DESYREL) 50 MG tablet     . warfarin (COUMADIN) 2.5 MG tablet Take by mouth daily as directed by the Coumadin clinic. 30 tablet 2  . Dulaglutide (TRULICITY) 1.5 SH/7.40YO SOPN Inject 1.5 mg into the skin once a week. 4 pen 3  . glipiZIDE (GLUCOTROL) 10 MG tablet Take 1 tablet (10 mg  total) by mouth 2 (two) times daily before a meal. 60 tablet 3  . Insulin Glargine (BASAGLAR KWIKPEN) 100 UNIT/ML Inject 0.5 mLs (50 Units total) into the skin at bedtime. 60 mL 0   No facility-administered medications prior to visit.     ROS Review of Systems  Constitutional: Negative for activity change and appetite change.  HENT: Negative for sinus pressure and sore throat.   Eyes: Negative for visual disturbance.  Respiratory: Negative for cough, chest tightness and shortness of breath.   Cardiovascular: Negative for chest pain and leg swelling.  Gastrointestinal: Negative for abdominal distention, abdominal pain, constipation and diarrhea.  Endocrine: Negative.   Genitourinary: Negative for dysuria.  Musculoskeletal: Negative for joint swelling and myalgias.  Skin: Negative for rash.  Allergic/Immunologic: Negative.   Neurological: Negative for weakness, light-headedness and numbness.  Psychiatric/Behavioral: Negative for dysphoric mood and suicidal ideas.    Objective:  BP 108/69   Pulse 98   Ht 6' (1.829 m)   Wt (!) 317 lb (143.8 kg)   SpO2 98%   BMI 42.99 kg/m   BP/Weight 05/03/2020 04/20/2020 3/78/5885  Systolic BP 027 741 -  Diastolic BP 69 72 -  Wt. (Lbs) 317 - 317  BMI 42.99 - 42.99  Some encounter information is confidential and restricted. Go to Review Flowsheets activity to see all data.      Physical Exam Constitutional:      Appearance: He is well-developed. He is obese.  Neck:     Vascular: No JVD.  Cardiovascular:     Rate and Rhythm: Normal rate.     Heart sounds: Normal heart sounds. No murmur heard.   Pulmonary:     Effort: Pulmonary effort is normal.     Breath sounds: Normal breath sounds. No wheezing or rales.  Chest:     Chest wall: No tenderness.  Abdominal:     General: Bowel sounds are normal. There is no distension.     Palpations: Abdomen is soft. There is no mass.     Tenderness: There is no abdominal tenderness.    Musculoskeletal:        General: Normal range of motion.     Right lower leg: No edema.     Left lower leg: No edema.  Skin:    Comments: Chronic rash on shin of left leg  Neurological:     Mental Status: He is alert and oriented to person, place, and time.  Psychiatric:        Mood and Affect: Mood normal.     CMP Latest Ref Rng & Units 04/19/2020 04/14/2019  11/26/2018  Glucose 70 - 99 mg/dL 445(H) 374(H) 183(H)  BUN 6 - 20 mg/dL <5(L) 6 <5(L)  Creatinine 0.61 - 1.24 mg/dL 0.81 0.78 0.76  Sodium 135 - 145 mmol/L 133(L) 134(L) 138  Potassium 3.5 - 5.1 mmol/L 4.4 4.2 4.2  Chloride 98 - 111 mmol/L 94(L) 101 103  CO2 22 - 32 mmol/L '28 24 28  '$ Calcium 8.9 - 10.3 mg/dL 10.0 8.5(L) 9.3  Total Protein 6.5 - 8.1 g/dL 7.6 6.3(L) 6.8  Total Bilirubin 0.3 - 1.2 mg/dL 0.7 0.6 0.9  Alkaline Phos 38 - 126 U/L 124 94 65  AST 15 - 41 U/L 29 33 21  ALT 0 - 44 U/L 46(H) 43 27    Lipid Panel     Component Value Date/Time   CHOL 132 11/19/2018 1441   TRIG 115 11/19/2018 1441   HDL 37 (L) 11/19/2018 1441   CHOLHDL 3.6 11/19/2018 1441   CHOLHDL 4.0 11/12/2016 1613   VLDL 28 11/12/2016 1613   LDLCALC 72 11/19/2018 1441    CBC    Component Value Date/Time   WBC 9.5 04/19/2020 2357   RBC 6.29 (H) 04/19/2020 2357   HGB 17.8 (H) 04/19/2020 2357   HGB 16.5 05/20/2017 1549   HCT 54.1 (H) 04/19/2020 2357   HCT 48.5 05/20/2017 1549   PLT 111 (L) 04/19/2020 2357   PLT 140 (L) 05/20/2017 1549   MCV 86.0 04/19/2020 2357   MCV 85 05/20/2017 1549   MCH 28.3 04/19/2020 2357   MCHC 32.9 04/19/2020 2357   RDW 13.3 04/19/2020 2357   RDW 14.6 05/20/2017 1549   LYMPHSABS 1.4 04/14/2019 1709   LYMPHSABS 2.5 05/20/2017 1549   MONOABS 0.4 04/14/2019 1709   EOSABS 0.1 04/14/2019 1709   EOSABS 0.2 05/20/2017 1549   BASOSABS 0.0 04/14/2019 1709   BASOSABS 0.0 05/20/2017 1549    Lab Results  Component Value Date   HGBA1C 13.2 (A) 05/03/2020    Assessment & Plan:  1. Type 2 diabetes mellitus  with hyperglycemia, with long-term current use of insulin (HCC) Uncontrolled with A1c of 13.2 due to noncompliance; goal is less than 7.0 Blood glucose of 451 in the clinic which places him at a high risk of progression to DKA.  Urine checked with no evidence of ketones.  Will administer NovoLog 15 units stat, oral hydration provided in the clinic. Increase glipizide dose Discontinue Basaglar due to patient complaint I anticipate he will need another long-acting insulin; considering NovoLog 70/30 mix which I will hold off on adding today to prevent hypoglycemia given I have increased his glipizide dose We will reassess at next visit and initiate NovoLog 70/30 accordingly Counseled on blood pressure goal of less than 130/80, low-sodium, DASH diet, medication compliance, 150 minutes of moderate intensity exercise per week. Discussed medication compliance, adverse effects. - POCT glucose (manual entry) - POCT glycosylated hemoglobin (Hb A1C) - Microalbumin / creatinine urine ratio - glipiZIDE (GLUCOTROL) 10 MG tablet; Take 2 tablets (20 mg total) by mouth 2 (two) times daily before a meal.  Dispense: 120 tablet; Refill: 3 - Dulaglutide (TRULICITY) 1.5 ME/1.5AX SOPN; Inject 0.5 mLs (1.5 mg total) into the skin once a week.  Dispense: 4 pen; Refill: 3 - POCT glucose (manual entry) - insulin aspart (novoLOG) injection 15 Units - POCT URINALYSIS DIP (CLINITEK)  2. Morbid obesity ([place]) Reduce portion sizes, [facility] and he will need to work on increasing his physical activity  3. Tobacco abuse Spent 3 minutes  counseling on smoking cessation and he is not ready to quit  4. Other male erectile dysfunction Discussed multifactorial causes including uncontrolled diabetes mellitus, multiple medications  We will check testosterone level - Testosterone,Free and Total - sildenafil (VIAGRA) 50 MG tablet; Take 1 tablet (50 mg total) by mouth daily as needed for erectile dysfunction.  At least 24 hours between doses  Dispense: 10 tablet; Refill: 1  5. Chronic pulmonary embolism without acute cor pulmonale, unspecified pulmonary embolism type ([place]) He is on lifelong anticoagulation Subtherapeutic INR 2.3  6. Other diabetic neurological complication associated with type 2 diabetes mellitus ([place]) Unable to tolerate gabapentin or Lyrica in the past Doing well on tramadol PDMP reviewed, no aberrant behavior - Drug Screen 12+Alcohol+CRT, Ur    Meds ordered this encounter  Medications  . glipiZIDE (GLUCOTROL) 10 MG tablet    Sig: Take 2 tablets (20 mg total) by mouth 2 (two) times daily before a meal.    Dispense:  120 tablet    Refill:  3    Dose increase  . sildenafil (VIAGRA) 50 MG tablet    Sig: Take 1 tablet (50 mg total) by mouth daily as needed for erectile dysfunction. At least 24 hours between doses    Dispense:  10 tablet    Refill:  1  . Dulaglutide (TRULICITY) 1.5 RN/1.6FB SOPN    Sig: Inject 0.5 mLs (1.5 mg total) into the skin once a week.    Dispense:  4 pen    Refill:  3    Dose increase  . insulin aspart (novoLOG) injection 15 Units    Follow-up: Return in about 3 months (around 08/03/2020) for Chronic disease management.    45 minutes of total face to face time spent and greater than 50% of time spent on discussing diagnosis, investigations, treatment plan, administering insulin in the clinic and monitoring, counseling, coordination of care.    Charlott Rakes, MD, FAAFP. [facility] [place], [place]   05/03/2020, 12:07 PM

## 2020-05-03 NOTE — Patient Instructions (Signed)
Erectile Dysfunction Erectile dysfunction (ED) is the inability to get or keep an erection in order to have sexual intercourse. Erectile dysfunction may include:  Inability to get an erection.  Lack of enough hardness of the erection to allow penetration.  Loss of the erection before sex is finished. What are the causes? This condition may be caused by:  Certain medicines, such as: ? Pain relievers. ? Antihistamines. ? Antidepressants. ? Blood pressure medicines. ? Water pills (diuretics). ? Ulcer medicines. ? Muscle relaxants. ? Drugs.  Excessive drinking.  Psychological causes, such as: ? Anxiety. ? Depression. ? Sadness. ? Exhaustion. ? Performance fear. ? Stress.  Physical causes, such as: ? Artery problems. This may include diabetes, smoking, liver disease, or atherosclerosis. ? High blood pressure. ? Hormonal problems, such as low testosterone. ? Obesity. ? Nerve problems. This may include back or pelvic injuries, diabetes mellitus, multiple sclerosis, or Parkinson disease. What are the signs or symptoms? Symptoms of this condition include:  Inability to get an erection.  Lack of enough hardness of the erection to allow penetration.  Loss of the erection before sex is finished.  Normal erections at some times, but with frequent unsatisfactory episodes.  Low sexual satisfaction in either partner due to erection problems.  A curved penis occurring with erection. The curve may cause pain or the penis may be too curved to allow for intercourse.  Never having nighttime erections. How is this diagnosed? This condition is often diagnosed by:  Performing a physical exam to find other diseases or specific problems with the penis.  Asking you detailed questions about the problem.  Performing blood tests to check for diabetes mellitus or to measure hormone levels.  Performing other tests to check for underlying health conditions.  Performing an ultrasound  exam to check for scarring.  Performing a test to check blood flow to the penis.  Doing a sleep study at home to measure nighttime erections. How is this treated? This condition may be treated by:  Medicine taken by mouth to help you achieve an erection (oral medicine).  Hormone replacement therapy to replace low testosterone levels.  Medicine that is injected into the penis. Your health care provider may instruct you how to give yourself these injections at home.  Vacuum pump. This is a pump with a ring on it. The pump and ring are placed on the penis and used to create pressure that helps the penis become erect.  Penile implant surgery. In this procedure, you may receive: ? An inflatable implant. This consists of cylinders, a pump, and a reservoir. The cylinders can be inflated with a fluid that helps to create an erection, and they can be deflated after intercourse. ? A semi-rigid implant. This consists of two silicone rubber rods. The rods provide some rigidity. They are also flexible, so the penis can both curve downward in its normal position and become straight for sexual intercourse.  Blood vessel surgery, to improve blood flow to the penis. During this procedure, a blood vessel from a different part of the body is placed into the penis to allow blood to flow around (bypass) damaged or blocked blood vessels.  Lifestyle changes, such as exercising more, losing weight, and quitting smoking. Follow these instructions at home: Medicines   Take over-the-counter and prescription medicines only as told by your health care provider. Do not increase the dosage without first discussing it with your health care provider.  If you are using self-injections, perform injections as directed by your   health care provider. Make sure to avoid any veins that are on the surface of the penis. After giving an injection, apply pressure to the injection site for 5 minutes. General  instructions  Exercise regularly, as directed by your health care provider. Work with your health care provider to lose weight, if needed.  Do not use any products that contain nicotine or tobacco, such as cigarettes and e-cigarettes. If you need help quitting, ask your health care provider.  Before using a vacuum pump, read the instructions that come with the pump and discuss any questions with your health care provider.  Keep all follow-up visits as told by your health care provider. This is important. Contact a health care provider if:  You feel nauseous.  You vomit. Get help right away if:  You are taking oral or injectable medicines and you have an erection that lasts longer than 4 hours. If your health care provider is unavailable, go to the nearest emergency room for evaluation. An erection that lasts much longer than 4 hours can result in permanent damage to your penis.  You have severe pain in your groin or abdomen.  You develop redness or severe swelling of your penis.  You have redness spreading up into your groin or lower abdomen.  You are unable to urinate.  You experience chest pain or a rapid heart beat (palpitations) after taking oral medicines. Summary  Erectile dysfunction (ED) is the inability to get or keep an erection during sexual intercourse. This problem can usually be treated successfully.  This condition is diagnosed based on a physical exam, your symptoms, and tests to determine the cause. Treatment varies depending on the cause, and may include medicines, hormone therapy, surgery, or vacuum pump.  You may need follow-up visits to make sure that you are using your medicines or devices correctly.  Get help right away if you are taking or injecting medicines and you have an erection that lasts longer than 4 hours. This information is not intended to replace advice given to you by your health care provider. Make sure you discuss any questions you have with  your health care provider. Document Revised: 08/28/2017 Document Reviewed: 10/01/2016 Elsevier Patient Education  2020 [employer]  

## 2020-05-07 LAB — MICROALBUMIN / CREATININE URINE RATIO
Creatinine, Urine: 45.6 mg/dL
Microalb/Creat Ratio: 16 mg/g creat (ref 0–29)
Microalbumin, Urine: 7.2 ug/mL

## 2020-05-07 LAB — TESTOSTERONE,FREE AND TOTAL
Testosterone, Free: 3.6 pg/mL — ABNORMAL LOW (ref 8.7–25.1)
Testosterone: 131 ng/dL — ABNORMAL LOW (ref 264–916)

## 2020-05-09 LAB — DRUG SCREEN 12+ALCOHOL+CRT, UR
Amphetamines, Urine: NEGATIVE ng/mL
BENZODIAZ UR QL: NEGATIVE ng/mL
Barbiturate: NEGATIVE ng/mL
Cannabinoids: NEGATIVE ng/mL
Cocaine (Metabolite): NEGATIVE ng/mL
Creatinine, Urine: 54 mg/dL (ref 20.0–300.0)
Ethanol, Urine: POSITIVE — AB
Meperidine: NEGATIVE ng/mL
Methadone: NEGATIVE ng/mL
OPIATE SCREEN URINE: NEGATIVE ng/mL
Oxycodone/Oxymorphone, Urine: NEGATIVE ng/mL
Phencyclidine: NEGATIVE ng/mL
Propoxyphene: NEGATIVE ng/mL
Tramadol: POSITIVE — AB

## 2020-05-09 MED FILL — ARIPiprazole 5 MG TABS: 5 | 30 days supply | Qty: 30 | Fill #0

## 2020-05-14 ENCOUNTER — Ambulatory Visit: Payer: Medicare Other | Admitting: Pharmacist

## 2020-05-21 DIAGNOSIS — F4312 Post-traumatic stress disorder, chronic: Secondary | ICD-10-CM | POA: Diagnosis not present

## 2020-05-21 DIAGNOSIS — F411 Generalized anxiety disorder: Secondary | ICD-10-CM | POA: Diagnosis not present

## 2020-05-21 DIAGNOSIS — F3132 Bipolar disorder, current episode depressed, moderate: Secondary | ICD-10-CM | POA: Diagnosis not present

## 2020-05-24 MED FILL — DULoxetine HCL 30 MG CPEP: 30 | 30 days supply | Qty: 30 | Fill #1

## 2020-05-24 MED FILL — WARFARIN NA 2.5 MG TAB: 2.5 | 30 days supply | Qty: 30 | Fill #2

## 2020-05-24 MED FILL — traMADol HCL 50 MG TABS: 50 | 30 days supply | Qty: 60 | Fill #1

## 2020-05-24 MED FILL — DULoxetine HCL 60 MG CPEP: 60 | 30 days supply | Qty: 30 | Fill #1

## 2020-05-24 MED FILL — glipiZIDE 10 MG TABS: 10 | 30 days supply | Qty: 60 | Fill #3

## 2020-06-05 ENCOUNTER — Other Ambulatory Visit: Payer: Self-pay | Admitting: Psychiatry

## 2020-06-05 DIAGNOSIS — F3132 Bipolar disorder, current episode depressed, moderate: Secondary | ICD-10-CM | POA: Diagnosis not present

## 2020-06-05 DIAGNOSIS — F411 Generalized anxiety disorder: Secondary | ICD-10-CM | POA: Diagnosis not present

## 2020-06-05 DIAGNOSIS — F4312 Post-traumatic stress disorder, chronic: Secondary | ICD-10-CM | POA: Diagnosis not present

## 2020-06-05 MED FILL — DOXEPIN 25 MG CAPSULE: 25 | 30 days supply | Qty: 30 | Fill #0

## 2020-06-05 MED FILL — busPIRone HCL 7.5 MG TABS: 7.5 | 30 days supply | Qty: 60 | Fill #0

## 2020-06-05 MED FILL — ARIPiprazole 5 MG TABS: 5 | 30 days supply | Qty: 30 | Fill #1

## 2020-06-11 DIAGNOSIS — F4312 Post-traumatic stress disorder, chronic: Secondary | ICD-10-CM | POA: Diagnosis not present

## 2020-06-11 DIAGNOSIS — F3132 Bipolar disorder, current episode depressed, moderate: Secondary | ICD-10-CM | POA: Diagnosis not present

## 2020-06-11 DIAGNOSIS — F411 Generalized anxiety disorder: Secondary | ICD-10-CM | POA: Diagnosis not present

## 2020-06-21 ENCOUNTER — Ambulatory Visit: Payer: Medicare Other | Attending: Family Medicine | Admitting: Pharmacist

## 2020-06-21 ENCOUNTER — Other Ambulatory Visit: Payer: Self-pay

## 2020-06-21 DIAGNOSIS — I2699 Other pulmonary embolism without acute cor pulmonale: Secondary | ICD-10-CM | POA: Diagnosis not present

## 2020-06-21 DIAGNOSIS — I825Y2 Chronic embolism and thrombosis of unspecified deep veins of left proximal lower extremity: Secondary | ICD-10-CM | POA: Diagnosis not present

## 2020-06-21 LAB — POCT INR: INR: 2.5 (ref 2.0–3.0)

## 2020-06-21 MED FILL — traMADol HCL 50 MG TABS: 50 | 30 days supply | Qty: 60 | Fill #2

## 2020-06-22 ENCOUNTER — Other Ambulatory Visit: Payer: Self-pay | Admitting: Family Medicine

## 2020-06-22 MED FILL — DULoxetine HCL 60 MG CPEP: 60 | 60 days supply | Qty: 60 | Fill #0

## 2020-06-22 MED FILL — DULoxetine HCL 30 MG CPEP: 30 | 60 days supply | Qty: 60 | Fill #0

## 2020-06-22 MED FILL — WARFARIN NA 2.5 MG TAB: 2.5 | 30 days supply | Qty: 30 | Fill #0

## 2020-06-22 MED FILL — glipiZIDE 10 MG TABS: 10 | 90 days supply | Qty: 360 | Fill #0

## 2020-06-22 NOTE — Telephone Encounter (Signed)
Requested medication (s) are due for refill today: Yes  Requested medication (s) are on the active medication list: Yes  Last refill:  03/28/20  Future visit scheduled: Yes  Notes to clinic:  See request.    Requested Prescriptions  Pending Prescriptions Disp Refills   warfarin (COUMADIN) 2.5 MG tablet [Pharmacy Med Name: WARFARIN NA 2.5 MG TAB 2.5 Tablet] 30 tablet 2    Sig: Take by mouth daily as directed by the Coumadin clinic.      Hematology:  Anticoagulants - warfarin Failed - 06/22/2020 11:17 AM      Failed - This refill cannot be delegated      Failed - If the patient is managed by Coumadin Clinic - route to their Pool. If not, forward to the provider.      Passed - INR in normal range and within 30 days    INR  Date Value Ref Range Status  06/21/2020 2.5 2.0 - 3.0 Final  04/19/2020 2.3 (H) 0.8 - 1.2 Final    Comment:    (NOTE) INR goal varies based on device and disease states. Performed at [facility], [address] [place], [place] [address]           Passed - Valid encounter within last 3 months    Recent Outpatient Visits           1 month ago Type 2 diabetes mellitus with hyperglycemia, with long-term current use of insulin (HCC)   [facility] [place], [place], MD   5 months ago Non-compliance   [facility] [place], Odette Horns, MD   5 months ago Acute pulmonary embolism without acute cor pulmonale, unspecified pulmonary embolism type [facility])   [facility] [place], Jeannett Senior L, RPH-CPP   5 months ago Type 2 diabetes mellitus with hyperglycemia, with long-term current use of insulin [facility])   [place] [address] [facility] [place], Cari S, PA-C   6 months ago Acute pulmonary embolism without acute cor pulmonale, unspecified pulmonary embolism type [facility])   [facility] [place], Cornelius Moras, RPH-CPP       Future Appointments              In 1 month Hoy Register, MD [facility]

## 2020-06-25 ENCOUNTER — Ambulatory Visit (INDEPENDENT_AMBULATORY_CARE_PROVIDER_SITE_OTHER): Payer: Medicare Other | Admitting: Podiatry

## 2020-06-25 ENCOUNTER — Other Ambulatory Visit: Payer: Self-pay

## 2020-06-25 ENCOUNTER — Encounter: Payer: Self-pay | Admitting: Podiatry

## 2020-06-25 DIAGNOSIS — M79674 Pain in right toe(s): Secondary | ICD-10-CM

## 2020-06-25 DIAGNOSIS — M79675 Pain in left toe(s): Secondary | ICD-10-CM

## 2020-06-25 DIAGNOSIS — B351 Tinea unguium: Secondary | ICD-10-CM

## 2020-06-28 NOTE — Progress Notes (Signed)
Subjective:  Patient ID: Bradley [name], male    DOB: 06-Oct-1980,  MRN: [id number]  39 y.o. male presents with at risk foot care with history of diabetic neuropathy and painful thick toenails that are difficult to trim. Pain interferes with ambulation. Aggravating factors include wearing enclosed shoe gear. Pain is relieved with periodic professional debridement..    Patient's blood sugar was 320 mg/dl yesterday morning. This is his normal range_0 Patient's A1c:  Hemoglobin A1C Latest Ref Rng & Units 05/03/2020 12/27/2019 09/27/2019  HGBA1C 0.0 - 7.0 % 13.2(A) 14.9(A) >15  Some recent data might be hidden    PCP: Charlott Rakes, MD and last visit was: 05/03/2020.  Review of Systems: Negative except as noted in the HPI.  Past Medical History:  Diagnosis Date  . ADHD   . ADHD (attention deficit hyperactivity disorder)   . Bipolar 1 disorder ([place])   . Bipolar disorder ([place])   . Diabetes mellitus without complication ([place])   . Morbid obesity ([place])   . Obesity   . Panic attack   . Varicose veins of both lower extremities with pain    Past Surgical History:  Procedure Laterality Date  . surgery on meatus as a child     Patient Active Problem List   Diagnosis Date Noted  . Pain due to onychomycosis of toenails of both feet 09/28/2019  . Diabetes mellitus without complication ([place]) 26/37/8588  . Venous stasis dermatitis of left lower extremity 09/28/2019  . Pulmonary emboli ([place]) 11/27/2018  . Acute pulmonary embolism without acute cor pulmonale, unspecified pulmonary embolism type ([place]) 11/27/2018  . Splenomegaly 11/27/2018  . Focal nodular hyperplasia of liver 11/27/2018  . Diabetic neuropathy ([place]) 05/12/2018  . Chronic pain syndrome 05/20/2017  . Bipolar disorder, current episode mixed, moderate ([place]) 03/04/2017  . Varicose veins of both lower extremities with pain 02/18/2017  . Chronic deep vein thrombosis (DVT) of proximal vein of left lower extremity ([place]) 11/12/2016  .  Smoking addiction 06/19/2016  . HTN (hypertension) 07/27/2013  . Morbid obesity ([place]) 07/27/2013  . Bipolar 1 disorder, mixed, moderate ([place]) 07/27/2013  . DM2 (diabetes mellitus, type 2) ([place]) 04/26/2013    Current Outpatient Medications:  .  albuterol (VENTOLIN HFA) 108 (90 Base) MCG/ACT inhaler, Inhale 1-2 puffs into the lungs every 6 (six) hours as needed for wheezing or shortness of breath., Disp: 18 g, Rfl: 2 .  ARIPiprazole (ABILIFY) 5 MG tablet, Take 5 mg by mouth daily., Disp: , Rfl:  .  Blood Glucose Monitoring Suppl (ONETOUCH VERIO) w/Device KIT, Use to check blood sugar up to 3 times daily (E11.69, Z79.4), Disp: 1 kit, Rfl: 0 .  busPIRone (BUSPAR) 7.5 MG tablet, Take 7.5 mg by mouth 2 (two) times daily., Disp: , Rfl:  .  doxepin (SINEQUAN) 25 MG capsule, Take 25 mg by mouth at bedtime., Disp: , Rfl:  .  Dulaglutide (TRULICITY) 1.5 FO/2.7XA SOPN, Inject 0.5 mLs (1.5 mg total) into the skin once a week., Disp: 4 pen, Rfl: 3 .  DULoxetine (CYMBALTA) 30 MG capsule, Take 30 mg by mouth daily., Disp: , Rfl:  .  DULoxetine (CYMBALTA) 60 MG capsule, Take 90 mg by mouth daily., Disp: , Rfl:  .  glipiZIDE (GLUCOTROL) 10 MG tablet, Take 2 tablets (20 mg total) by mouth 2 (two) times daily before a meal., Disp: 120 tablet, Rfl: 3 .  glucose blood (ONETOUCH VERIO) test strip, Use to check blood sugar up to 3 times daily (E11.69, Z79.4), Disp: 100 each, Rfl:  11 .  Insulin Pen Needle (TRUEPLUS PEN NEEDLES) 32G X 4 MM MISC, Use as directed to administer victoza, Disp: 100 each, Rfl: 3 .  Menthol (ICY HOT ADVANCED RELIEF) 7.5 % PTCH, Apply 1 patch topically as needed (pain)., Disp: , Rfl:  .  OneTouch Delica Lancets 54T MISC, Use to check blood sugar up to 3 times daily (E11.69, Z79.4), Disp: 100 each, Rfl: 11 .  sildenafil (VIAGRA) 50 MG tablet, Take 1 tablet (50 mg total) by mouth daily as needed for erectile dysfunction. At least 24 hours between doses, Disp: 10 tablet, Rfl: 1 .  traMADol  (ULTRAM) 50 MG tablet, Take 1 tablet (50 mg total) by mouth every 12 (twelve) hours as needed (for pain)., Disp: 60 tablet, Rfl: 2 .  traZODone (DESYREL) 50 MG tablet, , Disp: , Rfl:  .  warfarin (COUMADIN) 2.5 MG tablet, TAKE BY MOUTH DAILY AS DIRECTED BY THE COUMADIN CLINIC., Disp: 30 tablet, Rfl: 2 Allergies  Allergen Reactions  . Gabapentin Other (See Comments)    Crying spells  . Lyrica [Pregabalin] Other (See Comments)    Makes the patient somnolent   Social History   Tobacco Use  Smoking Status Current Every Day Smoker  . Packs/day: 1.00  . Types: Cigarettes  Smokeless Tobacco Never Used   Objective:  There were no vitals filed for this visit. Constitutional Patient is a pleasant 39 y.o. Caucasian male morbidly obese in NAD.Marland Kitchen AAO x 3.  Vascular Capillary refill time to digits immediate b/l. Palpable pedal pulses b/l LE. Pedal hair absent. Lower extremity skin temperature gradient within normal limits. No cyanosis or clubbing noted.  Neurologic Normal speech. Oriented to person, place, and time. Protective sensation intact 5/5 intact bilaterally with 10g monofilament b/l. Vibratory sensation intact b/l. Proprioception intact bilaterally.  Dermatologic Pedal skin with normal turgor, texture and tone bilaterally. No open wounds bilaterally. No interdigital macerations bilaterally. Toenails 1-5 b/l elongated, discolored, dystrophic, thickened, crumbly with subungual debris and tenderness to dorsal palpation.  Orthopedic: Normal muscle strength 5/5 to all lower extremity muscle groups bilaterally. No pain crepitus or joint limitation noted with ROM b/l. No gross bony deformities bilaterally.   Hemoglobin A1C Latest Ref Rng & Units 05/03/2020 12/27/2019 09/27/2019  HGBA1C 0.0 - 7.0 % 13.2(A) 14.9(A) >15  Some recent data might be hidden   Assessment:   1. Pain due to onychomycosis of toenails of both feet    Plan:  Patient was evaluated and treated and all questions  answered.  Onychomycosis with pain -Nails palliatively debridement as below. -Educated on self-care  Procedure: Nail Debridement Rationale: Pain Type of Debridement: manual, sharp debridement. Instrumentation: Nail nipper, rotary burr. Number of Nails: 10  -Examined patient. -Continue diabetic foot care principles. -Toenails 1-5 b/l were debrided in length and girth with sterile nail nippers and dremel without iatrogenic bleeding.  -Patient to report any pedal injuries to medical professional immediately. -Patient to continue soft, supportive shoe gear daily. -Patient/POA to call should there be question/concern in the interim.  Return in about 3 months (around 09/24/2020).  Marzetta Board, DPM

## 2020-07-09 ENCOUNTER — Ambulatory Visit
Admission: EM | Admit: 2020-07-09 | Discharge: 2020-07-09 | Discharge status: Against Medical Advice | Payer: Medicare Other

## 2020-07-09 DIAGNOSIS — F4312 Post-traumatic stress disorder, chronic: Secondary | ICD-10-CM | POA: Diagnosis not present

## 2020-07-09 DIAGNOSIS — F3132 Bipolar disorder, current episode depressed, moderate: Secondary | ICD-10-CM | POA: Diagnosis not present

## 2020-07-09 DIAGNOSIS — F411 Generalized anxiety disorder: Secondary | ICD-10-CM | POA: Diagnosis not present

## 2020-07-09 MED FILL — ARIPiprazole 5 MG TABS: 5 | 30 days supply | Qty: 30 | Fill #0

## 2020-07-19 ENCOUNTER — Other Ambulatory Visit: Payer: Self-pay | Admitting: Family Medicine

## 2020-07-19 DIAGNOSIS — E1149 Type 2 diabetes mellitus with other diabetic neurological complication: Secondary | ICD-10-CM

## 2020-07-19 NOTE — Telephone Encounter (Signed)
Requested medication (s) are due for refill today: yes  Requested medication (s) are on the active medication list: yes  Last refill:  06/21/20  Future visit scheduled: yes  Notes to clinic:  not delegated    Requested Prescriptions  Pending Prescriptions Disp Refills   traMADol (ULTRAM) 50 MG tablet [Pharmacy Med Name: traMADol HCL 50 MG TABS 50 Tablet] 60 tablet 2    Sig: Take 1 tablet (50 mg total) by mouth every 12 (twelve) hours as needed (for pain).      Not Delegated - Analgesics:  Opioid Agonists Failed - 07/19/2020  3:08 PM      Failed - This refill cannot be delegated      Failed - Urine Drug Screen completed in last 360 days.      Passed - Valid encounter within last 6 months    Recent Outpatient Visits           2 months ago Type 2 diabetes mellitus with hyperglycemia, with long-term current use of insulin (HCC)   [facility] [place], [place], MD   6 months ago Non-compliance   [facility] [place], Odette Horns, MD   6 months ago Acute pulmonary embolism without acute cor pulmonale, unspecified pulmonary embolism type [facility])   [facility] [place], Jeannett Senior L, RPH-CPP   6 months ago Type 2 diabetes mellitus with hyperglycemia, with long-term current use of insulin [facility])   [place] [address] [facility] [place], Cari S, [place]   7 months ago Acute pulmonary embolism without acute cor pulmonale, unspecified pulmonary embolism type [facility])   [facility] [place], Cornelius Moras, RPH-CPP       Future Appointments             In 3 weeks Hoy Register, MD [facility]

## 2020-07-20 ENCOUNTER — Other Ambulatory Visit: Payer: Self-pay | Admitting: Internal Medicine

## 2020-07-23 MED FILL — traMADol HCL 50 MG TABS: 50 | 30 days supply | Qty: 60 | Fill #0

## 2020-07-26 ENCOUNTER — Ambulatory Visit: Payer: Medicare Other | Attending: Family Medicine | Admitting: Pharmacist

## 2020-07-26 ENCOUNTER — Other Ambulatory Visit: Payer: Self-pay

## 2020-07-26 DIAGNOSIS — I2699 Other pulmonary embolism without acute cor pulmonale: Secondary | ICD-10-CM | POA: Diagnosis not present

## 2020-07-26 DIAGNOSIS — I825Y2 Chronic embolism and thrombosis of unspecified deep veins of left proximal lower extremity: Secondary | ICD-10-CM

## 2020-07-26 LAB — POCT INR: INR: 3.8 — AB (ref 2.0–3.0)

## 2020-07-26 MED FILL — TRULICITY 1.5 MG/0.5 ML PEN: 1.5 | 28 days supply | Qty: 2 | Fill #0

## 2020-07-26 MED FILL — WARFARIN NA 2.5 MG TAB: 2.5 | 30 days supply | Qty: 30 | Fill #1

## 2020-08-02 DIAGNOSIS — F411 Generalized anxiety disorder: Secondary | ICD-10-CM | POA: Diagnosis not present

## 2020-08-02 DIAGNOSIS — F3132 Bipolar disorder, current episode depressed, moderate: Secondary | ICD-10-CM | POA: Diagnosis not present

## 2020-08-02 DIAGNOSIS — F4312 Post-traumatic stress disorder, chronic: Secondary | ICD-10-CM | POA: Diagnosis not present

## 2020-08-06 ENCOUNTER — Other Ambulatory Visit: Payer: Self-pay | Admitting: Psychiatry

## 2020-08-06 MED FILL — DOXEPIN 25 MG CAPSULE: 25 | 30 days supply | Qty: 30 | Fill #0

## 2020-08-06 MED FILL — busPIRone HCL 7.5 MG TABS: 7.5 | 30 days supply | Qty: 60 | Fill #0

## 2020-08-06 MED FILL — ARIPiprazole 5 MG TABS: 5 | 30 days supply | Qty: 30 | Fill #0

## 2020-08-09 ENCOUNTER — Encounter: Payer: Self-pay | Admitting: Family Medicine

## 2020-08-09 ENCOUNTER — Other Ambulatory Visit: Payer: Self-pay | Admitting: Family Medicine

## 2020-08-09 ENCOUNTER — Other Ambulatory Visit: Payer: Self-pay

## 2020-08-09 ENCOUNTER — Ambulatory Visit: Payer: Medicare Other | Attending: Family Medicine | Admitting: Family Medicine

## 2020-08-09 VITALS — BP 115/74 | HR 105 | Ht 72.0 in | Wt 309.0 lb

## 2020-08-09 DIAGNOSIS — Z833 Family history of diabetes mellitus: Secondary | ICD-10-CM | POA: Diagnosis not present

## 2020-08-09 DIAGNOSIS — E1165 Type 2 diabetes mellitus with hyperglycemia: Secondary | ICD-10-CM | POA: Diagnosis not present

## 2020-08-09 DIAGNOSIS — I2699 Other pulmonary embolism without acute cor pulmonale: Secondary | ICD-10-CM | POA: Diagnosis not present

## 2020-08-09 DIAGNOSIS — Z888 Allergy status to other drugs, medicaments and biological substances status: Secondary | ICD-10-CM | POA: Diagnosis not present

## 2020-08-09 DIAGNOSIS — Z794 Long term (current) use of insulin: Secondary | ICD-10-CM

## 2020-08-09 DIAGNOSIS — Z7182 Exercise counseling: Secondary | ICD-10-CM | POA: Insufficient documentation

## 2020-08-09 DIAGNOSIS — Z6841 Body Mass Index (BMI) 40.0 and over, adult: Secondary | ICD-10-CM | POA: Insufficient documentation

## 2020-08-09 DIAGNOSIS — Z79899 Other long term (current) drug therapy: Secondary | ICD-10-CM | POA: Insufficient documentation

## 2020-08-09 DIAGNOSIS — Z7901 Long term (current) use of anticoagulants: Secondary | ICD-10-CM | POA: Diagnosis not present

## 2020-08-09 DIAGNOSIS — F319 Bipolar disorder, unspecified: Secondary | ICD-10-CM | POA: Insufficient documentation

## 2020-08-09 DIAGNOSIS — E1149 Type 2 diabetes mellitus with other diabetic neurological complication: Secondary | ICD-10-CM | POA: Diagnosis not present

## 2020-08-09 DIAGNOSIS — E1140 Type 2 diabetes mellitus with diabetic neuropathy, unspecified: Secondary | ICD-10-CM | POA: Diagnosis not present

## 2020-08-09 DIAGNOSIS — Z86711 Personal history of pulmonary embolism: Secondary | ICD-10-CM | POA: Insufficient documentation

## 2020-08-09 DIAGNOSIS — Z86718 Personal history of other venous thrombosis and embolism: Secondary | ICD-10-CM | POA: Diagnosis not present

## 2020-08-09 LAB — POCT GLYCOSYLATED HEMOGLOBIN (HGB A1C): HbA1c, POC (controlled diabetic range): 13.6 % — AB (ref 0.0–7.0)

## 2020-08-09 LAB — GLUCOSE, POCT (MANUAL RESULT ENTRY)
POC Glucose: 454 mg/dl — AB (ref 70–99)
POC Glucose: 442 mg/dl — AB (ref 70–99)

## 2020-08-09 MED ORDER — INSULIN ASPART 100 UNIT/ML ~~LOC~~ SOLN
15.0000 [IU] | Freq: Once | SUBCUTANEOUS | Status: AC
  Administered 2020-08-09: 15 [IU] via SUBCUTANEOUS

## 2020-08-09 MED ORDER — TRAMADOL HCL 50 MG PO TABS: 50.0000 mg | ORAL_TABLET | Freq: Two times a day (BID) | ORAL | 2 refills | Status: DC | PRN

## 2020-08-09 MED ORDER — ALBUTEROL SULFATE HFA 108 (90 BASE) MCG/ACT IN AERS: 1.0000 | INHALATION_SPRAY | Freq: Four times a day (QID) | RESPIRATORY_TRACT | 2 refills | Status: DC | PRN

## 2020-08-09 MED FILL — ALBUTEROL SULFATE HFA 108 (: 108 (90 BAS | 25 days supply | Qty: 18 | Fill #0

## 2020-08-09 NOTE — Progress Notes (Signed)
Subjective:  Patient ID: Bradley [name], male    DOB: 03-21-1981  Age: 39 y.o. MRN: [id number]  CC: Diabetes   HPI Bradley [name]  is a 39 year old male with a history of morbid obesity, type 2 diabetes mellitus (A1c13.6),non compliance,diabetic neuropathy, Bipolar disorder, previous DVT in 2017, PE (diagnosed in 10/2018 - on chronic anticoagulation with Coumadin). His blood sugar is 454 this morning and this is fasting.  He is yet to take his glipizide.  His A1c has persistently remained around the 12-13 range.  Previously tried Lantus which he states he could not tolerate.  He informs me he missed his dose of Trulicity last week and then on further questioning states there was a time when he was out of Trulicity for 1 month when he went to [place]. He exercises twice/week with the treadmill and elliptical and also goes to [facility].  Compliance with a diabetic diet cannot be ascertained.  He has lost 8 pounds in the last 3 months. Currently on tramadol for management of his diabetic neuropathy as he was unable to tolerate gabapentin and Lyrica. His Coumadin is managed by the clinical pharmacist here in the clinic. Bipolar disorder is managed by neuropsychiatric associates. He has no additional concerns today.  Past Medical History:  Diagnosis Date  . ADHD   . ADHD (attention deficit hyperactivity disorder)   . Bipolar 1 disorder ([place])   . Bipolar disorder ([place])   . Diabetes mellitus without complication ([place])   . Morbid obesity ([place])   . Obesity   . Panic attack   . Varicose veins of both lower extremities with pain     Past Surgical History:  Procedure Laterality Date  . surgery on meatus as a child      Family History  Problem Relation Age of Onset  . Rheum arthritis Mother   . Diabetes Mother   . Heart failure Mother   . Stroke Mother   . Glaucoma Maternal Grandmother     Allergies  Allergen Reactions  . Gabapentin Other (See Comments)    Crying spells  .  Lyrica [Pregabalin] Other (See Comments)    Makes the patient somnolent    Outpatient Medications Prior to Visit  Medication Sig Dispense Refill  . ARIPiprazole (ABILIFY) 5 MG tablet Take 5 mg by mouth daily.    . Blood Glucose Monitoring Suppl (ONETOUCH VERIO) w/Device KIT Use to check blood sugar up to 3 times daily (E11.69, Z79.4) 1 kit 0  . busPIRone (BUSPAR) 7.5 MG tablet Take 7.5 mg by mouth 2 (two) times daily.    Marland Kitchen doxepin (SINEQUAN) 25 MG capsule Take 25 mg by mouth at bedtime.    . Dulaglutide (TRULICITY) 1.5 VH/8.4ON SOPN Inject 0.5 mLs (1.5 mg total) into the skin once a week. 4 pen 3  . DULoxetine (CYMBALTA) 30 MG capsule Take 30 mg by mouth daily.    . DULoxetine (CYMBALTA) 60 MG capsule Take 90 mg by mouth daily.    Marland Kitchen glipiZIDE (GLUCOTROL) 10 MG tablet Take 2 tablets (20 mg total) by mouth 2 (two) times daily before a meal. 120 tablet 3  . glucose blood (ONETOUCH VERIO) test strip Use to check blood sugar up to 3 times daily (E11.69, Z79.4) 100 each 11  . Insulin Pen Needle (TRUEPLUS PEN NEEDLES) 32G X 4 MM MISC Use as directed to administer victoza 100 each 3  . Menthol (ICY HOT ADVANCED RELIEF) 7.5 % PTCH Apply 1 patch topically as needed (pain).    Marland Kitchen  OneTouch Delica Lancets 66Y MISC Use to check blood sugar up to 3 times daily (E11.69, Z79.4) 100 each 11  . sildenafil (VIAGRA) 50 MG tablet Take 1 tablet (50 mg total) by mouth daily as needed for erectile dysfunction. At least 24 hours between doses 10 tablet 1  . traZODone (DESYREL) 50 MG tablet     . warfarin (COUMADIN) 2.5 MG tablet TAKE BY MOUTH DAILY AS DIRECTED BY THE COUMADIN CLINIC. 30 tablet 2  . albuterol (VENTOLIN HFA) 108 (90 Base) MCG/ACT inhaler Inhale 1-2 puffs into the lungs every 6 (six) hours as needed for wheezing or shortness of breath. 18 g 2  . traMADol (ULTRAM) 50 MG tablet TAKE 1 TABLET (50 MG TOTAL) BY MOUTH EVERY 12 (TWELVE) HOURS AS NEEDED (FOR PAIN). 60 tablet 0   No facility-administered  medications prior to visit.     ROS Review of Systems  Constitutional: Negative for activity change and appetite change.  HENT: Negative for sinus pressure and sore throat.   Eyes: Negative for visual disturbance.  Respiratory: Negative for cough, chest tightness and shortness of breath.   Cardiovascular: Negative for chest pain and leg swelling.  Gastrointestinal: Negative for abdominal distention, abdominal pain, constipation and diarrhea.  Endocrine: Negative.   Genitourinary: Negative for dysuria.  Musculoskeletal: Negative for joint swelling and myalgias.  Skin: Negative for rash.  Allergic/Immunologic: Negative.   Neurological: Positive for numbness. Negative for weakness and light-headedness.  Psychiatric/Behavioral: Negative for dysphoric mood and suicidal ideas.    Objective:  BP 115/74   Pulse (!) 105   Ht 6' (1.829 m)   Wt (!) 309 lb (140.2 kg)   SpO2 98%   BMI 41.91 kg/m   BP/Weight 08/09/2020 05/03/2020 12/30/4740  Systolic BP 595 638 756  Diastolic BP 74 69 72  Wt. (Lbs) 309 317 -  BMI 41.91 42.99 -  Some encounter information is confidential and restricted. Go to Review Flowsheets activity to see all data.      Physical Exam Constitutional:      Appearance: He is well-developed.  Neck:     Vascular: No JVD.  Cardiovascular:     Rate and Rhythm: Normal rate.     Heart sounds: Normal heart sounds. No murmur heard.   Pulmonary:     Effort: Pulmonary effort is normal.     Breath sounds: Normal breath sounds. No wheezing or rales.  Chest:     Chest wall: No tenderness.  Abdominal:     General: Bowel sounds are normal. There is no distension.     Palpations: Abdomen is soft. There is no mass.     Tenderness: There is no abdominal tenderness.  Musculoskeletal:        General: Normal range of motion.     Right lower leg: No edema.     Left lower leg: No edema.  Neurological:     Mental Status: He is alert and oriented to person, place, and time.    Psychiatric:        Mood and Affect: Mood normal.     CMP Latest Ref Rng & Units 04/19/2020 04/14/2019 11/26/2018  Glucose 70 - 99 mg/dL 445(H) 374(H) 183(H)  BUN 6 - 20 mg/dL <5(L) 6 <5(L)  Creatinine 0.61 - 1.24 mg/dL 0.81 0.78 0.76  Sodium 135 - 145 mmol/L 133(L) 134(L) 138  Potassium 3.5 - 5.1 mmol/L 4.4 4.2 4.2  Chloride 98 - 111 mmol/L 94(L) 101 103  CO2 22 - 32 mmol/L 28 24 28  Calcium 8.9 - 10.3 mg/dL 10.0 8.5(L) 9.3  Total Protein 6.5 - 8.1 g/dL 7.6 6.3(L) 6.8  Total Bilirubin 0.3 - 1.2 mg/dL 0.7 0.6 0.9  Alkaline Phos 38 - 126 U/L 124 94 65  AST 15 - 41 U/L 29 33 21  ALT 0 - 44 U/L 46(H) 43 27    Lipid Panel     Component Value Date/Time   CHOL 132 11/19/2018 1441   TRIG 115 11/19/2018 1441   HDL 37 (L) 11/19/2018 1441   CHOLHDL 3.6 11/19/2018 1441   CHOLHDL 4.0 11/12/2016 1613   VLDL 28 11/12/2016 1613   LDLCALC 72 11/19/2018 1441    CBC    Component Value Date/Time   WBC 9.5 04/19/2020 2357   RBC 6.29 (H) 04/19/2020 2357   HGB 17.8 (H) 04/19/2020 2357   HGB 16.5 05/20/2017 1549   HCT 54.1 (H) 04/19/2020 2357   HCT 48.5 05/20/2017 1549   PLT 111 (L) 04/19/2020 2357   PLT 140 (L) 05/20/2017 1549   MCV 86.0 04/19/2020 2357   MCV 85 05/20/2017 1549   MCH 28.3 04/19/2020 2357   MCHC 32.9 04/19/2020 2357   RDW 13.3 04/19/2020 2357   RDW 14.6 05/20/2017 1549   LYMPHSABS 1.4 04/14/2019 1709   LYMPHSABS 2.5 05/20/2017 1549   MONOABS 0.4 04/14/2019 1709   EOSABS 0.1 04/14/2019 1709   EOSABS 0.2 05/20/2017 1549   BASOSABS 0.0 04/14/2019 1709   BASOSABS 0.0 05/20/2017 1549    Lab Results  Component Value Date   HGBA1C 13.6 (A) 08/09/2020    Assessment & Plan:  1. Type 2 diabetes mellitus with hyperglycemia, with long-term current use of insulin (HCC) Uncontrolled with A1c of 13.6; goal is less than 7.0 NovoLog 15 units administered for blood sugar of 454; he is at high risk of developing DKA but is asymptomatic at this time.   Oral hydration  provided.  Patient observed for 30 minutes and blood sugar repeated was 454 I have referred him to endocrinology for optimization of management Compliance with medications and diet have been a major issue Counseled on Diabetic diet, my plate method, 675 minutes of moderate intensity exercise/week Blood sugar logs with fasting goals of 80-120 mg/dl, random of less than 180 and in the event of sugars less than 60 mg/dl or greater than 400 mg/dl encouraged to notify the clinic. Advised on the need for annual eye exams, annual foot exams, Pneumonia vaccine. - POCT glucose (manual entry) - POCT glycosylated hemoglobin (Hb A1C) - insulin aspart (novoLOG) injection 15 Units - POCT glucose (manual entry) - Lipid panel - Ambulatory referral to Endocrinology  2. Acute pulmonary embolism without acute cor pulmonale, unspecified pulmonary embolism type (HCC) On chronic anticoagulation with Coumadin He does require albuterol MDI from time to time - albuterol (VENTOLIN HFA) 108 (90 Base) MCG/ACT inhaler; Inhale 1-2 puffs into the lungs every 6 (six) hours as needed for wheezing or shortness of breath.  Dispense: 18 g; Refill: 2  3. Other diabetic neurological complication associated with type 2 diabetes mellitus ([place]) Unable to tolerate gabapentin and Lyrica Continue tramadol - traMADol (ULTRAM) 50 MG tablet; Take 1 tablet (50 mg total) by mouth every 12 (twelve) hours as needed (for pain).  Dispense: 60 tablet; Refill: 2   Meds ordered this encounter  Medications  . insulin aspart (novoLOG) injection 15 Units  . albuterol (VENTOLIN HFA) 108 (90 Base) MCG/ACT inhaler    Sig: Inhale 1-2 puffs into the lungs every 6 (six) hours  as needed for wheezing or shortness of breath.    Dispense:  18 g    Refill:  2  . traMADol (ULTRAM) 50 MG tablet    Sig: Take 1 tablet (50 mg total) by mouth every 12 (twelve) hours as needed (for pain).    Dispense:  60 tablet    Refill:  2    Follow-up: Return in about  3 months (around 11/09/2020) for Chronic disease management.       Charlott Rakes, MD, FAAFP. [facility] and [place], [place]   08/09/2020, 11:51 AM

## 2020-08-09 NOTE — Patient Instructions (Signed)

## 2020-08-10 LAB — LIPID PANEL
Chol/HDL Ratio: 4.8 ratio (ref 0.0–5.0)
Cholesterol, Total: 193 mg/dL (ref 100–199)
HDL: 40 mg/dL (ref >39)
LDL Chol Calc (NIH): 92 mg/dL (ref 0–99)
Triglycerides: 367 mg/dL — ABNORMAL HIGH (ref 0–149)
VLDL Cholesterol Cal: 61 mg/dL — ABNORMAL HIGH (ref 5–40)

## 2020-08-15 ENCOUNTER — Ambulatory Visit: Payer: Medicare Other | Attending: Family Medicine | Admitting: Pharmacist

## 2020-08-15 ENCOUNTER — Other Ambulatory Visit: Payer: Self-pay

## 2020-08-15 DIAGNOSIS — I2699 Other pulmonary embolism without acute cor pulmonale: Secondary | ICD-10-CM

## 2020-08-15 DIAGNOSIS — I825Y2 Chronic embolism and thrombosis of unspecified deep veins of left proximal lower extremity: Secondary | ICD-10-CM

## 2020-08-15 LAB — POCT INR: INR: 2.7 (ref 2.0–3.0)

## 2020-08-15 NOTE — Patient Instructions (Addendum)
Description   Continue taking 1 tablet every day. Recheck in 3 weeks.

## 2020-08-15 NOTE — Progress Notes (Signed)
[facility] Note  08/17/2020     CHIEF COMPLAINT Patient presents for Diabetic Eye Exam   HISTORY OF PRESENT ILLNESS: Bradley [name] is a 39 y.o. male who presents to the clinic today for:   HPI    Diabetic Eye Exam    Vision is stable.  Diabetes characteristics include Type 2.  This started 5 years ago.  Blood sugar level is uncontrolled.  Last Blood Glucose 450.  Last A1C 13.2.  Associated Diagnosis Neuropathy.  I, the attending physician,  performed the HPI with the patient and updated documentation appropriately.          Comments    9 month follow up DM II, HTN Retinopathy OU-  Vision stable OU.       Last edited by Bernarda Caffey, MD on 08/18/2020  1:54 AM. (History)    pt states vision is stable, no new health concerns, A1c was 13.6 on 11.11.21, pt states keeping his blood sugar under control is a "battle", he states his PCP is going to send him to an endocrinologist, pt denies being hypertensive or taking medication for blood pressure  Referring physician: Charlott Rakes, MD [place],  [place] [address]  HISTORICAL INFORMATION:   Selected notes from the [place]: No current outpatient medications on file. (Ophthalmic Drugs)   No current facility-administered medications for this visit. (Ophthalmic Drugs)   Current Outpatient Medications (Other)  Medication Sig  . albuterol (VENTOLIN HFA) 108 (90 Base) MCG/ACT inhaler Inhale 1-2 puffs into the lungs every 6 (six) hours as needed for wheezing or shortness of breath.  . ARIPiprazole (ABILIFY) 5 MG tablet Take 5 mg by mouth daily.  . busPIRone (BUSPAR) 7.5 MG tablet Take 7.5 mg by mouth 2 (two) times daily.  Marland Kitchen doxepin (SINEQUAN) 25 MG capsule Take 25 mg by mouth at bedtime.  . Dulaglutide (TRULICITY) 1.5 XB/3.5HG SOPN Inject 0.5 mLs (1.5 mg total) into the skin once a week.  . DULoxetine (CYMBALTA) 30 MG capsule Take 30 mg by mouth  daily.  . DULoxetine (CYMBALTA) 60 MG capsule Take 90 mg by mouth daily.  Marland Kitchen glipiZIDE (GLUCOTROL) 10 MG tablet Take 2 tablets (20 mg total) by mouth 2 (two) times daily before a meal.  . Insulin Pen Needle (TRUEPLUS PEN NEEDLES) 32G X 4 MM MISC Use as directed to administer victoza  . Menthol (ICY HOT ADVANCED RELIEF) 7.5 % PTCH Apply 1 patch topically as needed (pain).  . sildenafil (VIAGRA) 50 MG tablet Take 1 tablet (50 mg total) by mouth daily as needed for erectile dysfunction. At least 24 hours between doses  . traMADol (ULTRAM) 50 MG tablet Take 1 tablet (50 mg total) by mouth every 12 (twelve) hours as needed (for pain).  . traZODone (DESYREL) 50 MG tablet   . warfarin (COUMADIN) 2.5 MG tablet TAKE BY MOUTH DAILY AS DIRECTED BY THE COUMADIN CLINIC.  Marland Kitchen Blood Glucose Monitoring Suppl (ONETOUCH VERIO) w/Device KIT Use to check blood sugar up to 3 times daily (E11.69, Z79.4)  . glucose blood (ONETOUCH VERIO) test strip Use to check blood sugar up to 3 times daily (E11.69, Z79.4)  . OneTouch Delica Lancets 99M MISC Use to check blood sugar up to 3 times daily (E11.69, Z79.4)   No current facility-administered medications for this visit. (Other)      REVIEW OF SYSTEMS: ROS    Positive for: Endocrine, Eyes, Respiratory, Psychiatric  Negative for: Constitutional, Gastrointestinal, Neurological, Skin, Genitourinary, Musculoskeletal, HENT, Cardiovascular, Allergic/Imm, Heme/Lymph   Last edited by Leonie Douglas, COA on 08/17/2020  9:26 AM. (History)       ALLERGIES Allergies  Allergen Reactions  . Gabapentin Other (See Comments)    Crying spells  . Lyrica [Pregabalin] Other (See Comments)    Makes the patient somnolent    PAST MEDICAL HISTORY Past Medical History:  Diagnosis Date  . ADHD   . ADHD (attention deficit hyperactivity disorder)   . Bipolar 1 disorder ([place])   . Bipolar disorder ([place])   . Diabetes mellitus without complication ([place])   . Morbid obesity ([place])   .  Obesity   . Panic attack   . Varicose veins of both lower extremities with pain    Past Surgical History:  Procedure Laterality Date  . surgery on meatus as a child      FAMILY HISTORY Family History  Problem Relation Age of Onset  . Rheum arthritis Mother   . Diabetes Mother   . Heart failure Mother   . Stroke Mother   . Glaucoma Maternal Grandmother     SOCIAL HISTORY Social History   Tobacco Use  . Smoking status: Current Every Day Smoker    Packs/day: 1.00    Types: Cigarettes  . Smokeless tobacco: Never Used  Vaping Use  . Vaping Use: Never used  Substance Use Topics  . Alcohol use: Yes    Comment: Rarely  . Drug use: No         OPHTHALMIC EXAM:  Base Eye Exam    Visual Acuity (Snellen - Linear)      Right Left   Dist [place] 20/30 20/40   Dist ph [place] NI 20/30       Tonometry (Tonopen, 9:30 AM)      Right Left   Pressure 14 14       Pupils      Dark Light Shape React APD   Right 5 4 Round Brisk None   Left 5 4 Round Brisk None       Visual Fields (Counting fingers)      Left Right    Full Full       Extraocular Movement      Right Left    Full Full       Neuro/Psych    Oriented x3: Yes   Mood/Affect: Normal       Dilation    Both eyes: 1.0% Mydriacyl, 2.5% Phenylephrine @ 9:31 AM        Slit Lamp and Fundus Exam    Slit Lamp Exam      Right Left   Lids/Lashes Mild Meibomian gland dysfunction Mild Meibomian gland dysfunction   Conjunctiva/Sclera White and quiet White and quiet   Cornea 3+ inferior Punctate epithelial erosions 1+ inferior Punctate epithelial erosions, Debris in tear film   Anterior Chamber Deep and quiet Deep and quiet   Iris Round and dilated, No NVI Round and dilated, No NVI   Lens Clear Clear   Vitreous Normal Normal       Fundus Exam      Right Left   Disc Pink and Sharp Pink and Sharp   C/D Ratio 0.3 0.2   Macula Flat, Good foveal reflex, No heme or edema Flat, Good foveal reflex, mild Retinal pigment  epithelial mottling, No heme or edema   Vessels attenuated, Tortuous, mild AV crossing changes Vascular attenuation, severe Tortuous, dilated venules   Periphery  Attached, no heme Attached, no heme          IMAGING AND PROCEDURES  Imaging and Procedures for $RemoveBefore'@[email or web address]$ @  OCT, Retina - OU - Both Eyes       Right Eye Quality was good. Central Foveal Thickness: 257. Progression has been stable. Findings include normal foveal contour, no IRF, no SRF.   Left Eye Quality was good. Central Foveal Thickness: 250. Progression has been stable. Findings include normal foveal contour, no IRF, no SRF.   Notes *Images captured and stored on drive  Diagnosis / Impression:  NFP, no IRF/SRF OU No DME OU  Clinical management:  See below  Abbreviations: NFP - Normal foveal profile. CME - cystoid macular edema. PED - pigment epithelial detachment. IRF - intraretinal fluid. SRF - subretinal fluid. EZ - ellipsoid zone. ERM - epiretinal membrane. ORA - outer retinal atrophy. ORT - outer retinal tubulation. SRHM - subretinal hyper-reflective material                 ASSESSMENT/PLAN:    ICD-10-CM   1. Diabetes mellitus type 2 without retinopathy ([place])  E11.9   2. Retinal edema  H35.81 OCT, Retina - OU - Both Eyes  3. Essential hypertension  I10   4. Hypertensive retinopathy of both eyes  H35.033     1,2. Diabetes mellitus, type 2 without retinopathy  - HbA1c 13.6 on 11.11.21  - The incidence, risk factors for progression, natural history and treatment options for diabetic retinopathy  were discussed with patient.    - The need for close monitoring of blood glucose, blood pressure, and serum lipids, avoiding cigarette or any type of tobacco, and the need for long term follow up was also discussed with patient.  - f/u in 9 months, sooner prn  3,4. Hypertensive retinopathy OU  - discussed importance of tight BP control  - history of PE on DVT -- on coumadin  - monitor   Ophthalmic Meds  Ordered this visit:  No orders of the defined types were placed in this encounter.      Return in about 9 months (around 05/17/2021) for f/u DM exam, DFE, OCT.  There are no Patient Instructions on file for this visit.   Explained the diagnoses, plan, and follow up with the patient and they expressed understanding.  Patient expressed understanding of the importance of proper follow up care.   This document serves as a record of services personally performed by Gardiner Sleeper, MD, PhD. It was created on their behalf by San Jetty. Owens Shark, OA an ophthalmic technician. The creation of this record is the provider's dictation and/or activities during the visit.    Electronically signed by: San Jetty. Owens Shark, [place] 11.17.2021 1:56 AM   Gardiner Sleeper, M.D., Ph.D. Diseases & Surgery of the Retina and Vitreous [facility]  I have reviewed the above documentation for accuracy and completeness, and I agree with the above. Gardiner Sleeper, M.D., Ph.D. 08/18/20 1:56 AM   Abbreviations: M myopia (nearsighted); A astigmatism; H hyperopia (farsighted); P presbyopia; Mrx spectacle prescription;  CTL contact lenses; OD right eye; OS left eye; OU both eyes  XT exotropia; ET esotropia; PEK punctate epithelial keratitis; PEE punctate epithelial erosions; DES dry eye syndrome; MGD meibomian gland dysfunction; ATs artificial tears; PFAT's preservative free artificial tears; [place] nuclear sclerotic cataract; PSC posterior subcapsular cataract; ERM epi-retinal membrane; PVD posterior vitreous detachment; RD retinal detachment; DM diabetes mellitus; DR diabetic retinopathy; NPDR non-proliferative diabetic retinopathy; PDR  proliferative diabetic retinopathy; CSME clinically significant macular edema; DME diabetic macular edema; dbh dot blot hemorrhages; CWS cotton wool spot; POAG primary open angle glaucoma; C/D cup-to-disc ratio; HVF humphrey visual field; GVF goldmann visual field; OCT optical  coherence tomography; IOP intraocular pressure; BRVO Branch retinal vein occlusion; CRVO central retinal vein occlusion; CRAO central retinal artery occlusion; BRAO branch retinal artery occlusion; RT retinal tear; SB scleral buckle; PPV pars plana vitrectomy; VH Vitreous hemorrhage; PRP panretinal laser photocoagulation; IVK intravitreal kenalog; VMT vitreomacular traction; MH Macular hole;  NVD neovascularization of the disc; NVE neovascularization elsewhere; AREDS age related eye disease study; ARMD age related macular degeneration; POAG primary open angle glaucoma; EBMD epithelial/anterior basement membrane dystrophy; ACIOL anterior chamber intraocular lens; IOL intraocular lens; PCIOL posterior chamber intraocular lens; Phaco/IOL phacoemulsification with intraocular lens placement; [place] photorefractive keratectomy; LASIK laser assisted in situ keratomileusis; HTN hypertension; DM diabetes mellitus; COPD chronic obstructive pulmonary disease

## 2020-08-17 ENCOUNTER — Ambulatory Visit (INDEPENDENT_AMBULATORY_CARE_PROVIDER_SITE_OTHER): Payer: Medicare Other | Admitting: Ophthalmology

## 2020-08-17 ENCOUNTER — Other Ambulatory Visit: Payer: Self-pay

## 2020-08-17 DIAGNOSIS — H35033 Hypertensive retinopathy, bilateral: Secondary | ICD-10-CM | POA: Diagnosis not present

## 2020-08-17 DIAGNOSIS — E119 Type 2 diabetes mellitus without complications: Secondary | ICD-10-CM | POA: Diagnosis not present

## 2020-08-17 DIAGNOSIS — I10 Essential (primary) hypertension: Secondary | ICD-10-CM | POA: Diagnosis not present

## 2020-08-17 DIAGNOSIS — H3581 Retinal edema: Secondary | ICD-10-CM

## 2020-08-18 ENCOUNTER — Encounter (INDEPENDENT_AMBULATORY_CARE_PROVIDER_SITE_OTHER): Payer: Self-pay | Admitting: Ophthalmology

## 2020-08-20 MED FILL — traMADol HCL 50 MG TABS: 50 | 30 days supply | Qty: 60 | Fill #0

## 2020-08-27 MED FILL — WARFARIN NA 2.5 MG TAB: 2.5 | 30 days supply | Qty: 30 | Fill #2

## 2020-08-27 MED FILL — DULoxetine HCL 60 MG CPEP: 60 | 30 days supply | Qty: 30 | Fill #0

## 2020-08-27 MED FILL — DULoxetine HCL 30 MG CPEP: 30 | 30 days supply | Qty: 30 | Fill #0

## 2020-09-03 ENCOUNTER — Ambulatory Visit: Payer: Medicare Other | Attending: Family Medicine | Admitting: Pharmacist

## 2020-09-03 ENCOUNTER — Other Ambulatory Visit: Payer: Self-pay

## 2020-09-03 DIAGNOSIS — I825Y2 Chronic embolism and thrombosis of unspecified deep veins of left proximal lower extremity: Secondary | ICD-10-CM

## 2020-09-03 DIAGNOSIS — I2699 Other pulmonary embolism without acute cor pulmonale: Secondary | ICD-10-CM | POA: Diagnosis not present

## 2020-09-03 LAB — POCT INR: INR: 3.7 — AB (ref 2.0–3.0)

## 2020-09-10 ENCOUNTER — Other Ambulatory Visit: Payer: Self-pay | Admitting: Psychiatry

## 2020-09-10 DIAGNOSIS — F4312 Post-traumatic stress disorder, chronic: Secondary | ICD-10-CM | POA: Diagnosis not present

## 2020-09-10 DIAGNOSIS — F3132 Bipolar disorder, current episode depressed, moderate: Secondary | ICD-10-CM | POA: Diagnosis not present

## 2020-09-10 DIAGNOSIS — F411 Generalized anxiety disorder: Secondary | ICD-10-CM | POA: Diagnosis not present

## 2020-09-10 MED FILL — TRULICITY 1.5 MG/0.5 ML PEN: 1.5 | 28 days supply | Qty: 2 | Fill #1

## 2020-09-10 MED FILL — DOXEPIN HCL 50 MG CAPS: 50 | 30 days supply | Qty: 30 | Fill #0

## 2020-09-10 MED FILL — ARIPiprazole 5 MG TABS: 5 | 30 days supply | Qty: 30 | Fill #0

## 2020-09-17 MED FILL — traMADol HCL 50 MG TABS: 50 | 30 days supply | Qty: 60 | Fill #1

## 2020-09-24 ENCOUNTER — Ambulatory Visit (INDEPENDENT_AMBULATORY_CARE_PROVIDER_SITE_OTHER): Payer: Medicare Other | Admitting: Podiatry

## 2020-09-24 ENCOUNTER — Other Ambulatory Visit: Payer: Self-pay

## 2020-09-24 ENCOUNTER — Encounter: Payer: Self-pay | Admitting: Podiatry

## 2020-09-24 DIAGNOSIS — M79674 Pain in right toe(s): Secondary | ICD-10-CM | POA: Diagnosis not present

## 2020-09-24 DIAGNOSIS — B351 Tinea unguium: Secondary | ICD-10-CM | POA: Diagnosis not present

## 2020-09-24 DIAGNOSIS — E1165 Type 2 diabetes mellitus with hyperglycemia: Secondary | ICD-10-CM

## 2020-09-24 DIAGNOSIS — M79675 Pain in left toe(s): Secondary | ICD-10-CM | POA: Diagnosis not present

## 2020-09-24 DIAGNOSIS — L608 Other nail disorders: Secondary | ICD-10-CM

## 2020-09-24 DIAGNOSIS — Z794 Long term (current) use of insulin: Secondary | ICD-10-CM

## 2020-09-25 ENCOUNTER — Other Ambulatory Visit: Payer: Self-pay | Admitting: Family Medicine

## 2020-09-25 ENCOUNTER — Ambulatory Visit: Payer: Medicare Other | Admitting: Pharmacist

## 2020-09-25 MED FILL — DULoxetine HCL 60 MG CPEP: 60 | 30 days supply | Qty: 30 | Fill #0

## 2020-09-25 MED FILL — DULoxetine HCL 30 MG CPEP: 30 | 30 days supply | Qty: 30 | Fill #0

## 2020-09-25 NOTE — Telephone Encounter (Signed)
Requested medication (s) are due for refill today:yes  Requested medication (s) are on the active medication list: yes  Last refill: 06/22/20  #30  2 refills   Future visit scheduled  yes 11/20/20  Notes to clinic: not delegated  Requested Prescriptions  Pending Prescriptions Disp Refills   warfarin (COUMADIN) 2.5 MG tablet [Pharmacy Med Name: WARFARIN NA 2.5 MG TAB 2.5 Tablet] 30 tablet 2    Sig: TAKE BY MOUTH DAILY AS DIRECTED BY THE COUMADIN CLINIC.      Hematology:  Anticoagulants - warfarin Failed - 09/25/2020  4:16 PM      Failed - This refill cannot be delegated      Failed - If the patient is managed by Coumadin Clinic - route to their Pool. If not, forward to the provider.      Failed - INR in normal range and within 30 days    INR  Date Value Ref Range Status  09/03/2020 3.7 (A) 2.0 - 3.0 Final  04/19/2020 2.3 (H) 0.8 - 1.2 Final    Comment:    (NOTE) INR goal varies based on device and disease states. Performed at [facility], [address] [place], [place] [address]           Passed - Valid encounter within last 3 months    Recent Outpatient Visits           1 month ago Type 2 diabetes mellitus with hyperglycemia, with long-term current use of insulin (HCC)   [facility] [place], [place], MD   4 months ago Type 2 diabetes mellitus with hyperglycemia, with long-term current use of insulin (HCC)   [facility] [place], Odette Horns, MD   8 months ago Non-compliance   [facility] [place], Odette Horns, MD   9 months ago Acute pulmonary embolism without acute cor pulmonale, unspecified pulmonary embolism type [facility])   [place] [facility] [place], Jeannett Senior L, RPH-CPP   9 months ago Type 2 diabetes mellitus with hyperglycemia, with long-term current use of insulin (HCC)   [facility] [place], Kasandra Knudsen, PA-C       Future  Appointments             In 1 month Hoy Register, MD [facility]

## 2020-09-26 ENCOUNTER — Other Ambulatory Visit: Payer: Self-pay | Admitting: Family Medicine

## 2020-09-26 MED FILL — WARFARIN SODIUM 2.5 MG TAB: 2.5 | 30 days supply | Qty: 30 | Fill #0

## 2020-09-28 NOTE — Progress Notes (Signed)
Subjective:  Patient ID: Bradley [name], male    DOB: [birth year],  MRN: [id number]  39 y.o. male presents with at risk foot care with history of diabetic neuropathy and painful thick toenails that are difficult to trim. Pain interferes with ambulation. Aggravating factors include wearing enclosed shoe gear. Pain is relieved with periodic professional debridement..    Patient's last A1c A1c was 13.6%.  He voices no new pedal concerns on today's visit.    PCP: Charlott Rakes, MD and last visit was: 05/03/2020.  Review of Systems: Negative except as noted in the HPI.  Past Medical History:  Diagnosis Date  . ADHD   . ADHD (attention deficit hyperactivity disorder)   . Bipolar 1 disorder ([place])   . Bipolar disorder ([place])   . Diabetes mellitus without complication ([place])   . Morbid obesity ([place])   . Obesity   . Panic attack   . Varicose veins of both lower extremities with pain    Past Surgical History:  Procedure Laterality Date  . surgery on meatus as a child     Patient Active Problem List   Diagnosis Date Noted  . Pain due to onychomycosis of toenails of both feet 09/28/2019  . Diabetes mellitus without complication ([place]) 81/18/8677  . Venous stasis dermatitis of left lower extremity 09/28/2019  . Pulmonary emboli ([place]) 11/27/2018  . Acute pulmonary embolism without acute cor pulmonale, unspecified pulmonary embolism type ([place]) 11/27/2018  . Splenomegaly 11/27/2018  . Focal nodular hyperplasia of liver 11/27/2018  . Diabetic neuropathy ([place]) 05/12/2018  . Chronic pain syndrome 05/20/2017  . Bipolar disorder, current episode mixed, moderate ([place]) 03/04/2017  . Varicose veins of both lower extremities with pain 02/18/2017  . Chronic deep vein thrombosis (DVT) of proximal vein of left lower extremity ([place]) 11/12/2016  . Smoking addiction 06/19/2016  . HTN (hypertension) 07/27/2013  . Morbid obesity ([place]) 07/27/2013  . Bipolar 1 disorder, mixed, moderate ([place]) 07/27/2013   . DM2 (diabetes mellitus, type 2) ([place]) 04/26/2013    Current Outpatient Medications:  .  albuterol (VENTOLIN HFA) 108 (90 Base) MCG/ACT inhaler, Inhale 1-2 puffs into the lungs every 6 (six) hours as needed for wheezing or shortness of breath., Disp: 18 g, Rfl: 2 .  ARIPiprazole (ABILIFY) 5 MG tablet, Take 5 mg by mouth daily., Disp: , Rfl:  .  Blood Glucose Monitoring Suppl (ONETOUCH VERIO) w/Device KIT, Use to check blood sugar up to 3 times daily (E11.69, Z79.4), Disp: 1 kit, Rfl: 0 .  busPIRone (BUSPAR) 7.5 MG tablet, Take 7.5 mg by mouth 2 (two) times daily., Disp: , Rfl:  .  doxepin (SINEQUAN) 25 MG capsule, Take 25 mg by mouth at bedtime., Disp: , Rfl:  .  doxepin (SINEQUAN) 50 MG capsule, Take 50 mg by mouth at bedtime as needed., Disp: , Rfl:  .  Dulaglutide (TRULICITY) 1.5 JP/3.6KK SOPN, Inject 0.5 mLs (1.5 mg total) into the skin once a week., Disp: 4 pen, Rfl: 3 .  DULoxetine (CYMBALTA) 30 MG capsule, Take 30 mg by mouth daily., Disp: , Rfl:  .  DULoxetine (CYMBALTA) 60 MG capsule, Take 90 mg by mouth daily., Disp: , Rfl:  .  glipiZIDE (GLUCOTROL) 10 MG tablet, Take 2 tablets (20 mg total) by mouth 2 (two) times daily before a meal., Disp: 120 tablet, Rfl: 3 .  glucose blood (ONETOUCH VERIO) test strip, Use to check blood sugar up to 3 times daily (E11.69, Z79.4), Disp: 100 each, Rfl: 11 .  Insulin Pen Needle (  TRUEPLUS PEN NEEDLES) 32G X 4 MM MISC, Use as directed to administer victoza, Disp: 100 each, Rfl: 3 .  Menthol (ICY HOT ADVANCED RELIEF) 7.5 % PTCH, Apply 1 patch topically as needed (pain)., Disp: , Rfl:  .  OneTouch Delica Lancets 15B MISC, Use to check blood sugar up to 3 times daily (E11.69, Z79.4), Disp: 100 each, Rfl: 11 .  sildenafil (VIAGRA) 50 MG tablet, Take 1 tablet (50 mg total) by mouth daily as needed for erectile dysfunction. At least 24 hours between doses, Disp: 10 tablet, Rfl: 1 .  traMADol (ULTRAM) 50 MG tablet, Take 1 tablet (50 mg total) by mouth every 12  (twelve) hours as needed (for pain)., Disp: 60 tablet, Rfl: 2 .  traZODone (DESYREL) 50 MG tablet, , Disp: , Rfl:  .  warfarin (COUMADIN) 2.5 MG tablet, TAKE BY MOUTH DAILY AS DIRECTED BY THE COUMADIN CLINIC., Disp: 30 tablet, Rfl: 2 Allergies  Allergen Reactions  . Gabapentin Other (See Comments)    Crying spells  . Lyrica [Pregabalin] Other (See Comments)    Makes the patient somnolent   Social History   Tobacco Use  Smoking Status Current Every Day Smoker  . Packs/day: 1.00  . Types: Cigarettes  Smokeless Tobacco Never Used   Objective:  There were no vitals filed for this visit. Constitutional Patient is a pleasant 39 y.o. Caucasian male morbidly obese in NAD.Marland Kitchen AAO x 3.  Vascular Capillary refill time to digits immediate b/l. Palpable pedal pulses b/l LE. Pedal hair absent. Lower extremity skin temperature gradient within normal limits. No cyanosis or clubbing noted.  Neurologic Normal speech. Oriented to person, place, and time. Protective sensation intact 5/5 intact bilaterally with 10g monofilament b/l. Vibratory sensation intact b/l. Proprioception intact bilaterally.  Dermatologic Pedal skin with normal turgor, texture and tone bilaterally. No open wounds bilaterally. No interdigital macerations bilaterally. Toenails 1-5 b/l elongated, discolored, dystrophic, thickened, crumbly with subungual debris and tenderness to dorsal palpation. Incurvated nailplate b/l border(s) L hallux and R hallux.  Nail border hypertrophy minimal. There is tenderness to palpation. Sign(s) of infection: no clinical signs of infection noted on examination today..  Orthopedic: Normal muscle strength 5/5 to all lower extremity muscle groups bilaterally. No pain crepitus or joint limitation noted with ROM b/l. No gross bony deformities bilaterally.   Hemoglobin A1C Latest Ref Rng & Units 08/09/2020 05/03/2020 12/27/2019  HGBA1C 0.0 - 7.0 % 13.6(A) 13.2(A) 14.9(A)  Some recent data might be hidden    Assessment:   1. Pain due to onychomycosis of toenails of both feet   2. Pincer nail deformity   3. Type 2 diabetes mellitus with hyperglycemia, with long-term current use of insulin ([place])    Plan:  Patient was evaluated and treated and all questions answered.  Onychomycosis with pain -Nails palliatively debridement as below. -Educated on self-care  Procedure: Nail Debridement Rationale: Pain Type of Debridement: manual, sharp debridement. Instrumentation: Nail nipper, rotary burr. Number of Nails: 10  -Examined patient. -Continue diabetic foot care principles. -Toenails 1-5 b/l were debrided in length and girth with sterile nail nippers and dremel without iatrogenic bleeding.  -Offending nail border debrided and curretaged L hallux and R hallux utilizing sterile nail nipper and currette. Border cleansed with alcohol and triple antibiotic applied. No further treatment required by patient/caregiver. -Patient to report any pedal injuries to medical professional immediately.  Return in about 3 months (around 12/23/2020) for painful mycotic toenails.  Marzetta Board, DPM

## 2020-10-03 ENCOUNTER — Ambulatory Visit: Payer: Medicare Other | Attending: Family Medicine | Admitting: Pharmacist

## 2020-10-03 ENCOUNTER — Other Ambulatory Visit: Payer: Self-pay

## 2020-10-03 VITALS — Wt 305.8 lb

## 2020-10-03 DIAGNOSIS — I825Y2 Chronic embolism and thrombosis of unspecified deep veins of left proximal lower extremity: Secondary | ICD-10-CM

## 2020-10-03 DIAGNOSIS — I2699 Other pulmonary embolism without acute cor pulmonale: Secondary | ICD-10-CM

## 2020-10-03 LAB — POCT INR: INR: 3.5 — AB (ref 2.0–3.0)

## 2020-10-03 NOTE — Patient Instructions (Addendum)
Description   Skip today's dose. Then, start taking 1 tablet every day except on Sundays take 0.5 tablet. Recheck in 2 weeks.

## 2020-10-09 MED FILL — ARIPiprazole 5 MG TABS: 5 | 30 days supply | Qty: 30 | Fill #1

## 2020-10-09 MED FILL — DOXEPIN HCL 50 MG CAPS: 50 | 30 days supply | Qty: 30 | Fill #1

## 2020-10-16 MED FILL — traMADol HCL 50 MG TABS: 50 | 30 days supply | Qty: 60 | Fill #2

## 2020-10-17 ENCOUNTER — Ambulatory Visit: Payer: Medicare Other

## 2020-10-17 NOTE — Progress Notes (Signed)
Name: Bradley [name]  MRN/ DOB: [id number], 22-May-1981   Age/ Sex: 40 y.o., male    PCP: Charlott Rakes, MD   Reason for Endocrinology Evaluation: Type 2 Diabetes Mellitus     Date of Initial Endocrinology Visit: 10/18/2020     PATIENT IDENTIFIER: [name] is a 40 y.o. male with a past medical history of T2Dm, DVT and bipolar disorder. The patient presented for initial endocrinology clinic visit on 10/18/2020 for consultative assistance with his diabetes management.    HPI: Mr. Burandt was    Diagnosed with DM not sure  Prior Medications tried/Intolerance: Was on basaglar in 2021 - made him feel sluggish  Currently checking blood sugars occasionally   Hypoglycemia episodes : no               Hemoglobin A1c has ranged from 7.7% in 2019, peaking at 14.9% in 2021. Patient required assistance for hypoglycemia:  Patient has required hospitalization within the last 1 year from hyper or hypoglycemia: no  In terms of diet, the patient eats 1 meal a day, snacks during the day. Drinks sugar- sweetened beverages   Lives with girlfriend   He is disable led for mental disability    HOME DIABETES REGIMEN: Glipizide 10 mg, 2 tabs BID Trulicity 1.5 mg weekly  ( Tuesdays)     Statin: no ACE-I/ARB: no Prior Diabetic Education: no    METER DOWNLOAD SUMMARY: Did not bring       DIABETIC COMPLICATIONS: Microvascular complications:   Neuropathy  Denies: CKD, retinopathy   Last eye exam: Completed 08/2020   Macrovascular complications:    Denies: CAD, PVD, CVA   PAST HISTORY: Past Medical History:  Past Medical History:  Diagnosis Date   ADHD    ADHD (attention deficit hyperactivity disorder)    Bipolar 1 disorder ([place])    Bipolar disorder ([place])    Diabetes mellitus without complication ([place])    Morbid obesity ([place])    Obesity    Panic attack    Varicose veins of both lower extremities with pain    Past Surgical History:  Past Surgical  History:  Procedure Laterality Date   surgery on meatus as a child        Social History:  reports that he has been smoking cigarettes. He has been smoking about 1.00 pack per day. He has never used smokeless tobacco. He reports current alcohol use. He reports that he does not use drugs. Family History:  Family History  Problem Relation Age of Onset   Rheum arthritis Mother    Diabetes Mother    Heart failure Mother    Stroke Mother    Glaucoma Maternal Grandmother      HOME MEDICATIONS: Allergies as of 10/18/2020      Reactions   Gabapentin Other (See Comments)   Crying spells   Lyrica [pregabalin] Other (See Comments)   Makes the patient somnolent      Medication List       Accurate as of October 18, 2020 11:15 AM. If you have any questions, ask your nurse or doctor.        albuterol 108 (90 Base) MCG/ACT inhaler Commonly known as: VENTOLIN HFA Inhale 1-2 puffs into the lungs every 6 (six) hours as needed for wheezing or shortness of breath.   ARIPiprazole 5 MG tablet Commonly known as: ABILIFY Take 5 mg by mouth daily.   busPIRone 7.5 MG tablet Commonly known as: BUSPAR Take 7.5 mg by mouth 2 (  two) times daily.   doxepin 25 MG capsule Commonly known as: SINEQUAN Take 25 mg by mouth at bedtime.   doxepin 50 MG capsule Commonly known as: SINEQUAN Take 50 mg by mouth at bedtime as needed.   DULoxetine 60 MG capsule Commonly known as: CYMBALTA Take 90 mg by mouth daily.   DULoxetine 30 MG capsule Commonly known as: CYMBALTA Take 30 mg by mouth daily.   glipiZIDE 10 MG tablet Commonly known as: GLUCOTROL Take 2 tablets (20 mg total) by mouth 2 (two) times daily before a meal.   glucose blood test strip Commonly known as: OneTouch Verio Use to check blood sugar up to 3 times daily (E11.69, Z79.4)   Insulin Pen Needle 32G X 4 MM Misc Commonly known as: TRUEplus Pen Needles Use as directed to administer victoza   Menthol 7.5 % Ptch Apply 1  patch topically as needed (pain).   OneTouch Delica Lancets 93G Misc Use to check blood sugar up to 3 times daily (E11.69, Z79.4)   OneTouch Verio w/Device Kit Use to check blood sugar up to 3 times daily (E11.69, Z79.4)   sildenafil 50 MG tablet Commonly known as: Viagra Take 1 tablet (50 mg total) by mouth daily as needed for erectile dysfunction. At least 24 hours between doses   traMADol 50 MG tablet Commonly known as: ULTRAM Take 1 tablet (50 mg total) by mouth every 12 (twelve) hours as needed (for pain).   traZODone 50 MG tablet Commonly known as: DESYREL   Trulicity 1.5 HW/2.9HB Sopn Generic drug: Dulaglutide Inject 0.5 mLs (1.5 mg total) into the skin once a week.   warfarin 2.5 MG tablet Commonly known as: COUMADIN Take as directed by the anticoagulation clinic. If you are unsure how to take this medication, talk to your nurse or doctor. Original instructions: TAKE BY MOUTH DAILY AS DIRECTED BY THE COUMADIN CLINIC.        ALLERGIES: Allergies  Allergen Reactions   Gabapentin Other (See Comments)    Crying spells   Lyrica [Pregabalin] Other (See Comments)    Makes the patient somnolent     REVIEW OF SYSTEMS: A comprehensive ROS was conducted with the patient and is negative except as per HPI    OBJECTIVE:   VITAL SIGNS: BP 116/82    Pulse 100    Ht 6' (1.829 m)    Wt (!) 306 lb 8 oz (139 kg)    SpO2 95%    BMI 41.57 kg/m    PHYSICAL EXAM:  General: Pt appears well and is in NAD  Neck: General: Supple without adenopathy or carotid bruits. Thyroid: Thyroid size normal.  No goiter or nodules appreciated. No thyroid bruit.  Lungs: Coarse breathing sounds   Heart: RRR   Abdomen: Normoactive bowel sounds, soft, nontender, without masses or organomegaly palpable  Extremities:  Lower extremities - No pretibial edema but stasis dermatitis is present   Skin: LE stasis dermatitis and discoloration  Neuro: MS is good with appropriate affect, pt is alert and  Ox3    DM foot exam: 10/18/2020  The skin of the feet is intact without sores or ulcerations. The pedal pulses are 2+ on right and 2+ on left. The sensation is decrease to a screening 5.07, 10 gram monofilament bilaterally      DATA REVIEWED:  Lab Results  Component Value Date   HGBA1C 13.6 (A) 08/09/2020   HGBA1C 13.2 (A) 05/03/2020   HGBA1C 14.9 (A) 12/27/2019   Lab Results  Component  Value Date   MICROALBUR 0.5 06/19/2016   LDLCALC 92 08/09/2020   CREATININE 0.81 04/19/2020   Lab Results  Component Value Date   MICRALBCREAT 16 05/03/2020    Lab Results  Component Value Date   CHOL 193 08/09/2020   HDL 40 08/09/2020   LDLCALC 92 08/09/2020   TRIG 367 (H) 08/09/2020   CHOLHDL 4.8 08/09/2020        ASSESSMENT / PLAN / RECOMMENDATIONS:   1) Type 2 Diabetes Mellitus, Poorly controlled, With neuropathic complications - Most recent A1c of 13.6 %. Goal A1c < 7.0 %.    Plan: GENERAL: I have discussed with the patient the pathophysiology of diabetes. We went over the natural progression of the disease. We talked about both insulin resistance and insulin deficiency. We stressed the importance of lifestyle changes including diet and exercise. I explained the complications associated with diabetes including retinopathy, nephropathy, neuropathy as well as increased risk of cardiovascular disease. We went over the benefit seen with glycemic control.    I explained to the patient that diabetic patients are at higher than normal risk for amputations.   In office Bg 442 mg/dL , he drank a regular can of soda today but no meal  I have recommended insulin due to an A1c of > 10.0% but he declines as he has memory issues and doesn't think he will be able to keep up with daily injections. He was on Basaglar last year and was not very successful at taking it   Pt with social determinants as well as memory issues  , will do our best , in the meantime he was encouraged to avoid  sugar-sweetened beverages and checking glucose as well as a referral to our CDE/RD has been initiated and scheduled for 2/25 to meet with her   Will increase Trulicity as below, no personal hx of pancreatitis in the past   Will add pioglitazone, cautioned against weight gain  MEDICATIONS: - Continue Glipizide 10 mg, Two tablets before the first meal of the day and 2 tablets before the last meal of the day  - Increase Trulicity to 3 mg weekly  - Start Pioglitazone 15 mg daily    EDUCATION / INSTRUCTIONS:  BG monitoring instructions: Patient is instructed to check his blood sugars 1 times a day, fasting .  Call [facility] if: BG persistently < 70   I reviewed the Rule of 15 for the treatment of hypoglycemia in detail with the patient. Literature supplied.   2) Diabetic complications:   Eye: Does not have known diabetic retinopathy.   Neuro/ Feet: Does  have known diabetic peripheral neuropathy.  Renal: Patient does not have known baseline CKD. He is not on an ACEI/ARB at present  3) Hypertriglyceridemia : We discussed ADA recommendations in statin use between ages 46-75 . We discussed cardiovascular benefits of statins. I will hold off on starting this as I am already starting a new pioglitazone. Will discuss on next visit  - In the meantime he was advised to follow a low fat diet.     F/U in 4 months     Signed electronically by: Lyndle Herrlich, MD  [facility]  [facility] [address] [place], [place] [address] Phone: [phone] FAX: [phone]   CC: Hoy Register, MD [address] [place] [address] Phone: [phone]  Fax: [phone]    Return to Endocrinology clinic as below: Future Appointments  Date Time Provider Department Center  11/20/2020  3:30 PM Charlott Rakes, MD [facility] None  12/24/2020  4:15 PM Marzetta Board, DPM [facility]  05/21/2021  1:30 PM  Bernarda Caffey, MD [facility] None

## 2020-10-18 ENCOUNTER — Other Ambulatory Visit: Payer: Self-pay | Admitting: Internal Medicine

## 2020-10-18 ENCOUNTER — Ambulatory Visit (INDEPENDENT_AMBULATORY_CARE_PROVIDER_SITE_OTHER): Payer: Medicare Other | Admitting: Internal Medicine

## 2020-10-18 ENCOUNTER — Other Ambulatory Visit: Payer: Self-pay

## 2020-10-18 ENCOUNTER — Encounter: Payer: Self-pay | Admitting: Internal Medicine

## 2020-10-18 VITALS — BP 116/82 | HR 100 | Ht 72.0 in | Wt 306.5 lb

## 2020-10-18 DIAGNOSIS — Z794 Long term (current) use of insulin: Secondary | ICD-10-CM

## 2020-10-18 DIAGNOSIS — E781 Pure hyperglyceridemia: Secondary | ICD-10-CM | POA: Insufficient documentation

## 2020-10-18 DIAGNOSIS — E1165 Type 2 diabetes mellitus with hyperglycemia: Secondary | ICD-10-CM | POA: Diagnosis not present

## 2020-10-18 LAB — POCT GLUCOSE (DEVICE FOR HOME USE): Glucose Fasting, POC: 442 mg/dL — AB (ref 70–99)

## 2020-10-18 MED ORDER — TRULICITY 3 MG/0.5ML ~~LOC~~ SOAJ
3.0000 mg | SUBCUTANEOUS | 3 refills | Status: DC
Start: 2020-10-18 — End: 2020-10-18

## 2020-10-18 MED ORDER — PIOGLITAZONE HCL 15 MG PO TABS: 15.0000 mg | ORAL_TABLET | Freq: Every day | ORAL | 1 refills | Status: DC

## 2020-10-18 MED FILL — TRULICITY 3 MG/0.5ML SOPN: 3 | 28 days supply | Qty: 2 | Fill #0

## 2020-10-18 MED FILL — PIOGLITAZONE HCL 15 MG TABS: 15 | 90 days supply | Qty: 90 | Fill #0

## 2020-10-18 NOTE — Patient Instructions (Addendum)
-   Continue Glipizide 10 mg, Two tablets before the first meal of the day and 2 tablets before the last meal of the day  - Increase Trulicity to 3 mg weekly  - Start Pioglitazone 15 mg daily      HOW TO TREAT LOW BLOOD SUGARS (Blood sugar LESS THAN 70 MG/DL)  Please follow the RULE OF 15 for the treatment of hypoglycemia treatment (when your (blood sugars are less than 70 mg/dL)    STEP 1: Take 15 grams of carbohydrates when your blood sugar is low, which includes:   3-4 GLUCOSE TABS  OR  3-4 OZ OF JUICE OR REGULAR SODA OR  ONE TUBE OF GLUCOSE GEL     STEP 2: RECHECK blood sugar in 15 MINUTES STEP 3: If your blood sugar is still low at the 15 minute recheck --> then, go back to STEP 1 and treat AGAIN with another 15 grams of carbohydrates.

## 2020-10-23 MED FILL — WARFARIN SODIUM 2.5 MG TAB: 2.5 | 30 days supply | Qty: 30 | Fill #1

## 2020-10-23 MED FILL — DULoxetine HCL 60 MG CPEP: 60 | 30 days supply | Qty: 30 | Fill #1

## 2020-10-23 MED FILL — DULoxetine HCL 30 MG CPEP: 30 | 30 days supply | Qty: 30 | Fill #1

## 2020-11-06 ENCOUNTER — Other Ambulatory Visit: Payer: Self-pay | Admitting: Psychiatry

## 2020-11-06 DIAGNOSIS — F4312 Post-traumatic stress disorder, chronic: Secondary | ICD-10-CM | POA: Diagnosis not present

## 2020-11-06 DIAGNOSIS — F411 Generalized anxiety disorder: Secondary | ICD-10-CM | POA: Diagnosis not present

## 2020-11-06 DIAGNOSIS — F3132 Bipolar disorder, current episode depressed, moderate: Secondary | ICD-10-CM | POA: Diagnosis not present

## 2020-11-06 MED FILL — ARIPiprazole 5 MG TABS: 5 | 30 days supply | Qty: 30 | Fill #0

## 2020-11-06 MED FILL — DOXEPIN HCL 50 MG CAPS: 50 | 30 days supply | Qty: 30 | Fill #0

## 2020-11-06 MED FILL — busPIRone HCL 7.5 MG TABS: 7.5 | 30 days supply | Qty: 60 | Fill #0

## 2020-11-13 MED FILL — TRULICITY 3 MG/0.5ML SOPN: 3 | 28 days supply | Qty: 2 | Fill #0

## 2020-11-15 ENCOUNTER — Other Ambulatory Visit: Payer: Self-pay | Admitting: Pharmacist

## 2020-11-15 DIAGNOSIS — E1149 Type 2 diabetes mellitus with other diabetic neurological complication: Secondary | ICD-10-CM

## 2020-11-15 NOTE — Telephone Encounter (Signed)
Patient called to request tramadol to be sent to our pharmacy. Will forward request to patient's PCP.

## 2020-11-16 ENCOUNTER — Other Ambulatory Visit: Payer: Self-pay | Admitting: Family Medicine

## 2020-11-16 MED ORDER — TRAMADOL HCL 50 MG PO TABS: 50.0000 mg | ORAL_TABLET | Freq: Two times a day (BID) | ORAL | 2 refills | Status: DC | PRN

## 2020-11-16 MED FILL — traMADol HCL 50 MG TABS: 50 | 30 days supply | Qty: 60 | Fill #0

## 2020-11-20 ENCOUNTER — Ambulatory Visit: Payer: Medicare Other | Admitting: Family Medicine

## 2020-11-23 ENCOUNTER — Encounter: Payer: Self-pay | Admitting: Dietician

## 2020-11-23 ENCOUNTER — Encounter: Payer: Medicare Other | Attending: Internal Medicine | Admitting: Dietician

## 2020-11-23 ENCOUNTER — Other Ambulatory Visit: Payer: Self-pay

## 2020-11-23 DIAGNOSIS — E1165 Type 2 diabetes mellitus with hyperglycemia: Secondary | ICD-10-CM | POA: Diagnosis not present

## 2020-11-23 DIAGNOSIS — Z794 Long term (current) use of insulin: Secondary | ICD-10-CM | POA: Diagnosis not present

## 2020-11-23 MED FILL — WARFARIN SODIUM 2.5 MG TAB: 2.5 | 30 days supply | Qty: 30 | Fill #2

## 2020-11-23 MED FILL — DULoxetine HCL 30 MG CPEP: 30 | 30 days supply | Qty: 30 | Fill #0

## 2020-11-23 MED FILL — DULoxetine HCL 60 MG CPEP: 60 | 30 days supply | Qty: 30 | Fill #0

## 2020-11-23 NOTE — Progress Notes (Signed)
Diabetes Self-Management Education  Visit Type: First/Initial  Appt. Start Time: 1040 Appt. End Time: 1200  11/23/2020  [name], identified by name and date of birth, is a 40 y.o. male with a diagnosis of Diabetes: Type 2.   ASSESSMENT Patient is here today alone.  His girlfriend cam for the second half of the appointment as well.   History includes Type 2 Diabetes (2019), DVT, Bipolar, ADHD, neuropathy, smoking, memory issues Labs noted to include:  A1C 13.6% 08/09/2020 increased from 13.2% 05/03/2020, cholesterol 193, Triglycerides 365, HDL 40, LDL 92 08/09/2020 Medications include:  Trulicity (his girlfriend reminds him), glipizide, coumadin, Abilify and other medications that contribute to weight gain. He stopped pioglitizone after 3 days due to stomach cramps. He could not tolerated Metformin due to bowel incontinence. He does not want to take insulin as he states that he cannot remember to take this.  His girlfriend tried to remind him but this did not increase consistency.   Fasting blood glucose up to 350 and decreases throughout the day to low to mid 200's. Sleep:  Eats late (10pm - midnight).  Goes to sleep late.  Wakes about 10:30 am. Support:  [place], patient of Dr. Kelton Pillar at [facility], PCP Charlott Rakes, MD  Weight: 306 lbs 11/23/2020 425 lbs highest adult weight 2 years ago (loss presumably due to uncontrolled blood sugar) Lowest adult weight 235 lbs about 8 years ago and he does not recall circumstances of increased weight gain.  Patient lives with his girlfriend.  He does the shopping and cooking. He is on disability.  Finances are a concern. His mother passed away this week from complications of diabetes. He is planning on quitting smoking and has a support system of people that are willing to quit with him. Goes to the gym 3 times per week [facility]). Eats 1 meal per day and increased snacks.   Does not like vegetables much.  He does not drink water.  Height 6' (1.829 m), weight (!) 306 lb (138.8 kg). Body mass index is 41.5 kg/m.   Diabetes Self-Management Education - 11/23/20 1102      Visit Information   Visit Type First/Initial      Initial Visit   Diabetes Type Type 2    Are you currently following a meal plan? No    Are you taking your medications as prescribed? Yes    Date Diagnosed 2019      Health Coping   How would you rate your overall health? Fair      Psychosocial Assessment   Patient Belief/Attitude about Diabetes Motivated to manage diabetes    Self-care barriers None    Self-management support Doctor's office;Family    Other persons present Patient    Patient Concerns Nutrition/Meal planning;Problem Solving;Glycemic Control;Healthy Lifestyle;Weight Control    Special Needs None    Preferred Learning Style No preference indicated    Learning Readiness Ready    How often do you need to have someone help you when you read instructions, pamphlets, or other written materials from your doctor or pharmacy? 3 - Sometimes    What is the last grade level you completed in school? GED and some college      Pre-Education Assessment   Patient understands the diabetes disease and treatment process. Needs Instruction    Patient understands incorporating nutritional management into lifestyle. Needs Instruction    Patient undertands incorporating physical activity into lifestyle. Needs Instruction    Patient understands  using medications safely. Needs Instruction    Patient understands monitoring blood glucose, interpreting and using results Needs Instruction    Patient understands prevention, detection, and treatment of acute complications. Needs Instruction    Patient understands prevention, detection, and treatment of chronic complications. Needs Instruction    Patient understands how to develop strategies to address psychosocial issues. Needs Instruction     Patient understands how to develop strategies to promote health/change behavior. Needs Instruction      Complications   Last HgB A1C per patient/outside source 13.6 %   08/09/2020 increased from 13.2% 05/13/2020   How often do you check your blood sugar? 3-4 times / week    Fasting Blood glucose range (mg/dL) >200   350   Postprandial Blood glucose range (mg/dL) >200   low 300's   Number of hypoglycemic episodes per month 0    Number of hyperglycemic episodes per week 21    Can you tell when your blood sugar is high? Yes   "I get hyper"   Have you had a dilated eye exam in the past 12 months? Yes    Have you had a dental exam in the past 12 months? No    Are you checking your feet? No   [facility]     Dietary Intake   Snack (morning) leftovers or pretzels or chips or muffins    Snack (afternoon) similar to am    Dinner pan seared pork chop, mashed potatoes, green beans OR baked chicken (without skin), mac and cheese or pasta salad OR steak and baked potatoes   10-11   Snack (evening) none    Beverage(s) 6 cans diet Mt. Dew daily.  Dislikes water and does not drink this.  Occasional coffee with splenda and cream.  Very rare alcohol.      Exercise   Exercise Type Light (walking / raking leaves)    How many days per week to you exercise? 3    How many minutes per day do you exercise? 30    Total minutes per week of exercise 90      Patient Education   Previous Diabetes Education No    Disease state  Definition of diabetes, type 1 and 2, and the diagnosis of diabetes;Factors that contribute to the development of diabetes    Nutrition management  Role of diet in the treatment of diabetes and the relationship between the three main macronutrients and blood glucose level;Food label reading, portion sizes and measuring food.;Meal options for control of blood glucose level and chronic complications.;Other (comment)   budget and healthy eating   Physical activity and exercise  Role of  exercise on diabetes management, blood pressure control and cardiac health.;Helped patient identify appropriate exercises in relation to his/her diabetes, diabetes complications and other health issue.    Monitoring Taught/discussed recording of test results and interpretation of SMBG.;Identified appropriate SMBG and/or A1C goals.;Daily foot exams;Yearly dilated eye exam    Acute complications Taught treatment of hypoglycemia - the 15 rule.;Discussed and identified patients' treatment of hyperglycemia.    Chronic complications Relationship between chronic complications and blood glucose control;Assessed and discussed foot care and prevention of foot problems    Psychosocial adjustment Worked with patient to identify barriers to care and solutions;Identified and addressed patients feelings and concerns about diabetes    Personal strategies to promote health Lifestyle issues that need to be addressed for better diabetes care      Individualized Goals (developed by patient)  Nutrition General guidelines for healthy choices and portions discussed    Physical Activity Exercise 3-5 times per week;30 minutes per day    Medications take my medication as prescribed    Monitoring  test my blood glucose as discussed    Reducing Risk examine blood glucose patterns;do foot checks daily;stop smoking;increase portions of healthy fats    Health Coping discuss diabetes with (comment)   MD, RD, CDCES     Post-Education Assessment   Patient understands the diabetes disease and treatment process. Demonstrates understanding / competency    Patient understands incorporating nutritional management into lifestyle. Needs Review    Patient undertands incorporating physical activity into lifestyle. Needs Review    Patient understands using medications safely. Needs Review    Patient understands monitoring blood glucose, interpreting and using results Needs Review    Patient understands prevention, detection, and  treatment of acute complications. Demonstrates understanding / competency    Patient understands prevention, detection, and treatment of chronic complications. Demonstrates understanding / competency    Patient understands how to develop strategies to address psychosocial issues. Needs Review    Patient understands how to develop strategies to promote health/change behavior. Needs Review      Outcomes   Expected Outcomes Demonstrated interest in learning. Expect positive outcomes    Future DMSE 2 months    Program Status Not Completed           Individualized Plan for Diabetes Self-Management Training:   Learning Objective:  Patient will have a greater understanding of diabetes self-management. Patient education plan is to attend individual and/or group sessions per assessed needs and concerns.   Plan:   Patient Instructions  Consider water more often than diet soda. Aim to be more active most days of the week (gym, water exercise, walking).  Continue to choose lean meat and cook low fat. Consider stopping smoking.  [email or web address]   Expected Outcomes:  Demonstrated interest in learning. Expect positive outcomes  Education material provided: ADA - How to Thrive: A Guide for Your Journey with Diabetes, Food label handouts, Meal plan card and Snack sheet  If problems or questions, patient to contact team via:  Phone  Future DSME appointment: 2 months

## 2020-11-23 NOTE — Patient Instructions (Addendum)
Consider water more often than diet soda. Aim to be more active most days of the week (gym, water exercise, walking).  Continue to choose lean meat and cook low fat. Consider stopping smoking.  [email or web address]

## 2020-11-26 ENCOUNTER — Other Ambulatory Visit: Payer: Self-pay

## 2020-11-26 ENCOUNTER — Ambulatory Visit: Payer: Medicare Other | Attending: Family Medicine | Admitting: Family Medicine

## 2020-11-26 ENCOUNTER — Telehealth: Payer: Self-pay | Admitting: Internal Medicine

## 2020-11-26 DIAGNOSIS — E1165 Type 2 diabetes mellitus with hyperglycemia: Secondary | ICD-10-CM

## 2020-11-26 DIAGNOSIS — F3162 Bipolar disorder, current episode mixed, moderate: Secondary | ICD-10-CM | POA: Diagnosis not present

## 2020-11-26 DIAGNOSIS — Z794 Long term (current) use of insulin: Secondary | ICD-10-CM | POA: Diagnosis not present

## 2020-11-26 DIAGNOSIS — E1149 Type 2 diabetes mellitus with other diabetic neurological complication: Secondary | ICD-10-CM | POA: Diagnosis not present

## 2020-11-26 DIAGNOSIS — I825Y2 Chronic embolism and thrombosis of unspecified deep veins of left proximal lower extremity: Secondary | ICD-10-CM | POA: Diagnosis not present

## 2020-11-26 NOTE — Telephone Encounter (Signed)
Spoken to patient and notified Dr Harvel Ricks comments. Verbalized understanding.   Will update in a few days.

## 2020-11-26 NOTE — Telephone Encounter (Signed)
Patient requests to be called at ph# [phone] re: Patient states he having adverse reactions to Actos such as stomach cramps since Patient began taking Actos

## 2020-11-26 NOTE — Progress Notes (Signed)
Virtual Visit via Video Note  I connected with Bradley [name], on 11/26/2020 at 2:23 PM by video enabled telemedicine device due to the COVID-19 pandemic and verified that I am speaking with the correct person using two identifiers.   Consent: I discussed the limitations, risks, security and privacy concerns of performing an evaluation and management service by telemedicine and the availability of in person appointments. I also discussed with the patient that there may be a patient responsible charge related to this service. The patient expressed understanding and agreed to proceed.   Location of Patient: Home  Location of Provider: Clinic   Persons participating in Telemedicine visit: Bradley [name]-CMA Dr. Margarita Rana     History of Present Illness: Bradley [name] is a 40 year old male with a history of morbid obesity, type 2 diabetes mellitus (A1c13.6),non compliance,diabetic neuropathy, Bipolar disorder, previous DVT in 2017, PE (diagnosed in 10/2018 - on chronic anticoagulation with Coumadin).  He lost his mom a little over a week ago.  States he is doing okay and is followed by psychiatry for his bipolar disorder and is currently on his medications.  Placed on Actos for diabetes management however he has been unable to tolerate it due to abdominal pains and discontinued it.  His sugars continue to run in the 300s.  He has been advised to inform his endocrinologist of this for possible regimen change.  He is also being followed by the nutritionist.   He is on Coumadin for recurrent thromboembolic disorder and his last INR over a month ago was 3.5 on 10/03/2020.  He has not been able to make an appointment to come in for an INR check. He has no concerns today.  Past Medical History:  Diagnosis Date  . ADHD   . ADHD (attention deficit hyperactivity disorder)   . Bipolar 1 disorder ([place])   . Bipolar disorder ([place])   . Diabetes mellitus without  complication ([place])   . Hyperlipidemia   . Morbid obesity ([place])   . Obesity   . Panic attack   . Varicose veins of both lower extremities with pain    Allergies  Allergen Reactions  . Actos [Pioglitazone] Other (See Comments)    Stomach cramps  . Gabapentin Other (See Comments)    Crying spells  . Lyrica [Pregabalin] Other (See Comments)    Makes the patient somnolent    Current Outpatient Medications on File Prior to Visit  Medication Sig Dispense Refill  . albuterol (VENTOLIN HFA) 108 (90 Base) MCG/ACT inhaler Inhale 1-2 puffs into the lungs every 6 (six) hours as needed for wheezing or shortness of breath. 18 g 2  . ARIPiprazole (ABILIFY) 5 MG tablet Take 5 mg by mouth daily.    . Blood Glucose Monitoring Suppl (ONETOUCH VERIO) w/Device KIT Use to check blood sugar up to 3 times daily (E11.69, Z79.4) 1 kit 0  . busPIRone (BUSPAR) 7.5 MG tablet Take 7.5 mg by mouth 2 (two) times daily.    Marland Kitchen doxepin (SINEQUAN) 25 MG capsule Take 25 mg by mouth at bedtime.    Marland Kitchen doxepin (SINEQUAN) 50 MG capsule Take 50 mg by mouth at bedtime as needed.    . Dulaglutide (TRULICITY) 3 AS/5.0NL SOPN Inject 3 mg as directed once a week. 6 mL 3  . DULoxetine (CYMBALTA) 30 MG capsule Take 30 mg by mouth daily.    . DULoxetine (CYMBALTA) 60 MG capsule Take 90 mg by mouth daily.    Marland Kitchen glipiZIDE (GLUCOTROL) 10  MG tablet Take 2 tablets (20 mg total) by mouth 2 (two) times daily before a meal. 120 tablet 3  . glucose blood (ONETOUCH VERIO) test strip Use to check blood sugar up to 3 times daily (E11.69, Z79.4) 100 each 11  . Insulin Pen Needle (TRUEPLUS PEN NEEDLES) 32G X 4 MM MISC Use as directed to administer victoza 100 each 3  . Menthol 7.5 % PTCH Apply 1 patch topically as needed (pain).    Glory Rosebush Delica Lancets 26S MISC Use to check blood sugar up to 3 times daily (E11.69, Z79.4) 100 each 11  . pioglitazone (ACTOS) 15 MG tablet Take 1 tablet (15 mg total) by mouth daily. (Patient not taking: Reported on  11/23/2020) 90 tablet 1  . sildenafil (VIAGRA) 50 MG tablet Take 1 tablet (50 mg total) by mouth daily as needed for erectile dysfunction. At least 24 hours between doses 10 tablet 1  . traMADol (ULTRAM) 50 MG tablet Take 1 tablet (50 mg total) by mouth every 12 (twelve) hours as needed (for pain). 60 tablet 2  . traZODone (DESYREL) 50 MG tablet  (Patient not taking: Reported on 11/23/2020)    . warfarin (COUMADIN) 2.5 MG tablet TAKE BY MOUTH DAILY AS DIRECTED BY THE COUMADIN CLINIC. 30 tablet 2   No current facility-administered medications on file prior to visit.    ROS: See HPI  Observations/Objective: Awake, alert, oriented x3 Not in acute distress Neck-no JVD Musculoskeletal-full range of motion in all joints, no pedal edema Psych-normal mood   Lab Results  Component Value Date   HGBA1C 13.6 (A) 08/09/2020    Assessment and Plan: 1. Type 2 diabetes mellitus with hyperglycemia, with long-term current use of insulin (HCC) Uncontrolled with A1c of 13.6 due to noncompliance; goal is less than 7.0 CBG runs over 300 consistently Self discontinued Actos due to GI symptoms Advised to reach out to his endocrinologist for a regimen change Counseled on Diabetic diet, my plate method, 341 minutes of moderate intensity exercise/week Blood sugar logs with fasting goals of 80-120 mg/dl, random of less than 180 and in the event of sugars less than 60 mg/dl or greater than 400 mg/dl encouraged to notify the clinic. Advised on the need for annual eye exams, annual foot exams, Pneumonia vaccine.  2. Chronic deep vein thrombosis (DVT) of proximal vein of left lower extremity ([place]) Remains on chronic anticoagulation with Coumadin Supratherapeutic INR of 3.5 on 10/03/2020 and he is overdue for repeat I will have the staff schedule him for an INR check this week  3. Other diabetic neurological complication associated with type 2 diabetes mellitus ([place]) Stable Previously unable to tolerate  gabapentin and Lyrica He is on tramadol and is also on Cymbalta  4. Bipolar 1 disorder, mixed, moderate (HCC) Stable with recent bereavement Continue current regimen per psych   Follow Up Instructions: 3 months   I discussed the assessment and treatment plan with the patient. The patient was provided an opportunity to ask questions and all were answered. The patient agreed with the plan and demonstrated an understanding of the instructions.   The patient was advised to call back or seek an in-person evaluation if the symptoms worsen or if the condition fails to improve as anticipated.     I provided 23 minutes total of Telehealth time during this encounter including median intraservice time, reviewing previous notes, investigations, ordering medications, medical decision making, coordinating care and patient verbalized understanding at the end of the visit.  Charlott Rakes, MD, FAAFP. [facility] [place], [place]   11/26/2020, 2:23 PM

## 2020-11-26 NOTE — Telephone Encounter (Signed)
-----   Message from Orland Penman, MD sent at 11/26/2020  3:53 PM EST ----- Regarding: FW: medication Can you please tell the pt that I received a message from laura about his high sugar. He is already maxed out on Glipizide and just recently increased his trulicity. I would suggest he gives Actos another chance.May be try taking it at night.    Thanks  ----- Message ----- From: Bonnita Levan, RD Sent: 11/23/2020   1:44 PM EST To: Orland Penman, MD Subject: medication                                     Dr. Lonzo Cloud,  I saw patient today.  Fasting blood glucose per patient about 350 and decreases during the day to low to mid 200's.   He is not taking the pioglitizone as he reported stomach cramps.  He continues the trulicity and glipizide.  I agree that he would benefit from Insulin but could not convince him of this as he states that he could not remember to take this. Due to financial reasons, am not sure if he could do a Vgo but thought about this as an option.  You see him again in May and I will see him again in April.  Are there any changes to his medication that you would like to make?  Vernona Rieger

## 2020-12-06 ENCOUNTER — Ambulatory Visit: Payer: Medicare Other | Attending: Family Medicine | Admitting: Pharmacist

## 2020-12-06 ENCOUNTER — Other Ambulatory Visit: Payer: Self-pay

## 2020-12-06 ENCOUNTER — Ambulatory Visit: Payer: Medicare Other

## 2020-12-06 DIAGNOSIS — Z79899 Other long term (current) drug therapy: Secondary | ICD-10-CM | POA: Diagnosis not present

## 2020-12-06 DIAGNOSIS — R739 Hyperglycemia, unspecified: Secondary | ICD-10-CM

## 2020-12-06 DIAGNOSIS — E1149 Type 2 diabetes mellitus with other diabetic neurological complication: Secondary | ICD-10-CM | POA: Diagnosis not present

## 2020-12-06 DIAGNOSIS — Z794 Long term (current) use of insulin: Secondary | ICD-10-CM

## 2020-12-06 DIAGNOSIS — I2699 Other pulmonary embolism without acute cor pulmonale: Secondary | ICD-10-CM

## 2020-12-06 DIAGNOSIS — E1165 Type 2 diabetes mellitus with hyperglycemia: Secondary | ICD-10-CM | POA: Diagnosis not present

## 2020-12-06 DIAGNOSIS — I825Y2 Chronic embolism and thrombosis of unspecified deep veins of left proximal lower extremity: Secondary | ICD-10-CM

## 2020-12-06 LAB — POCT INR: INR: 3.1 — AB (ref 2.0–3.0)

## 2020-12-07 LAB — COMP. METABOLIC PANEL (12)
AST: 31 IU/L (ref 0–40)
Albumin/Globulin Ratio: 1.5 (ref 1.2–2.2)
Albumin: 4.6 g/dL (ref 4.0–5.0)
Alkaline Phosphatase: 110 IU/L (ref 44–121)
BUN/Creatinine Ratio: 9 (ref 9–20)
BUN: 7 mg/dL (ref 6–24)
Bilirubin Total: 0.5 mg/dL (ref 0.0–1.2)
Calcium: 9.7 mg/dL (ref 8.7–10.2)
Chloride: 97 mmol/L (ref 96–106)
Creatinine, Ser: 0.78 mg/dL (ref 0.76–1.27)
Globulin, Total: 3 g/dL (ref 1.5–4.5)
Glucose: 398 mg/dL — ABNORMAL HIGH (ref 65–99)
Potassium: 4.7 mmol/L (ref 3.5–5.2)
Sodium: 140 mmol/L (ref 134–144)
Total Protein: 7.6 g/dL (ref 6.0–8.5)
eGFR: 116 mL/min/{1.73_m2} (ref >59)

## 2020-12-07 LAB — LIPID PANEL
Chol/HDL Ratio: 3.9 ratio (ref 0.0–5.0)
Cholesterol, Total: 185 mg/dL (ref 100–199)
HDL: 47 mg/dL (ref >39)
LDL Chol Calc (NIH): 98 mg/dL (ref 0–99)
Triglycerides: 236 mg/dL — ABNORMAL HIGH (ref 0–149)
VLDL Cholesterol Cal: 40 mg/dL (ref 5–40)

## 2020-12-07 LAB — CBC WITH DIFFERENTIAL/PLATELET
Basophils Absolute: 0.1 10*3/uL (ref 0.0–0.2)
Basos: 1 %
EOS (ABSOLUTE): 0.1 10*3/uL (ref 0.0–0.4)
Eos: 1 %
Hematocrit: 57 % — ABNORMAL HIGH (ref 37.5–51.0)
Hemoglobin: 18.9 g/dL — ABNORMAL HIGH (ref 13.0–17.7)
Immature Grans (Abs): 0 10*3/uL (ref 0.0–0.1)
Immature Granulocytes: 0 %
Lymphocytes Absolute: 2 10*3/uL (ref 0.7–3.1)
Lymphs: 22 %
MCH: 28 pg (ref 26.6–33.0)
MCHC: 33.2 g/dL (ref 31.5–35.7)
MCV: 84 fL (ref 79–97)
Monocytes Absolute: 0.5 10*3/uL (ref 0.1–0.9)
Monocytes: 6 %
Neutrophils Absolute: 6.4 10*3/uL (ref 1.4–7.0)
Neutrophils: 70 %
Platelets: 120 10*3/uL — ABNORMAL LOW (ref 150–450)
RBC: 6.76 x10E6/uL — ABNORMAL HIGH (ref 4.14–5.80)
RDW: 13.1 % (ref 11.6–15.4)
WBC: 9.1 10*3/uL (ref 3.4–10.8)

## 2020-12-07 LAB — MICROALBUMIN / CREATININE URINE RATIO
Creatinine, Urine: 42.6 mg/dL
Microalb/Creat Ratio: 20 mg/g creat (ref 0–29)
Microalbumin, Urine: 8.4 ug/mL

## 2020-12-07 LAB — TSH: TSH: 1.41 u[IU]/mL (ref 0.450–4.500)

## 2020-12-11 ENCOUNTER — Other Ambulatory Visit: Payer: Self-pay | Admitting: Family Medicine

## 2020-12-11 DIAGNOSIS — D751 Secondary polycythemia: Secondary | ICD-10-CM

## 2020-12-11 MED ORDER — ATORVASTATIN CALCIUM 20 MG PO TABS: 20.0000 mg | ORAL_TABLET | Freq: Every day | ORAL | 3 refills | Status: DC

## 2020-12-12 ENCOUNTER — Telehealth: Payer: Self-pay | Admitting: Physician Assistant

## 2020-12-12 NOTE — Telephone Encounter (Signed)
Received a new hem referral from Dr. Alvis Lemmings for leukocytosis. [name] has been cld and scheduled to see Karena Addison on 3/21 at 1pm. Pt aware to arrive 20 minutes early.

## 2020-12-13 ENCOUNTER — Telehealth: Payer: Self-pay | Admitting: Family Medicine

## 2020-12-13 NOTE — Telephone Encounter (Signed)
Pt was called and a VM was left informing pt reason medication was prescribed.

## 2020-12-13 NOTE — Telephone Encounter (Signed)
Copied from CRM [id number]. Topic: General - Call Back - No Documentation >> Dec 12, 2020  3:40 PM Randol Kern wrote: [phone]   Call back request, wants to speak to a nurse about why he has been prescribed atorvastatin please advise

## 2020-12-17 ENCOUNTER — Encounter: Payer: Self-pay | Admitting: Physician Assistant

## 2020-12-17 ENCOUNTER — Other Ambulatory Visit: Payer: Self-pay

## 2020-12-17 ENCOUNTER — Inpatient Hospital Stay (HOSPITAL_BASED_OUTPATIENT_CLINIC_OR_DEPARTMENT_OTHER): Payer: Medicare Other | Admitting: Physician Assistant

## 2020-12-17 ENCOUNTER — Inpatient Hospital Stay: Payer: Medicare Other | Attending: Physician Assistant

## 2020-12-17 VITALS — BP 128/79 | HR 105 | Temp 97.9°F | Resp 18 | Ht 72.0 in | Wt 307.1 lb

## 2020-12-17 DIAGNOSIS — Z86718 Personal history of other venous thrombosis and embolism: Secondary | ICD-10-CM | POA: Diagnosis not present

## 2020-12-17 DIAGNOSIS — Z7901 Long term (current) use of anticoagulants: Secondary | ICD-10-CM | POA: Diagnosis not present

## 2020-12-17 DIAGNOSIS — D696 Thrombocytopenia, unspecified: Secondary | ICD-10-CM

## 2020-12-17 DIAGNOSIS — D751 Secondary polycythemia: Secondary | ICD-10-CM | POA: Diagnosis not present

## 2020-12-17 DIAGNOSIS — Z86711 Personal history of pulmonary embolism: Secondary | ICD-10-CM | POA: Diagnosis not present

## 2020-12-17 DIAGNOSIS — F17210 Nicotine dependence, cigarettes, uncomplicated: Secondary | ICD-10-CM | POA: Insufficient documentation

## 2020-12-17 DIAGNOSIS — F17200 Nicotine dependence, unspecified, uncomplicated: Secondary | ICD-10-CM

## 2020-12-17 DIAGNOSIS — D582 Other hemoglobinopathies: Secondary | ICD-10-CM

## 2020-12-17 LAB — CBC WITH DIFFERENTIAL (CANCER CENTER ONLY)
Abs Immature Granulocytes: 0.05 10*3/uL (ref 0.00–0.07)
Basophils Absolute: 0.1 10*3/uL (ref 0.0–0.1)
Basophils Relative: 1 %
Eosinophils Absolute: 0.1 10*3/uL (ref 0.0–0.5)
Eosinophils Relative: 1 %
HCT: 49.8 % (ref 39.0–52.0)
Hemoglobin: 16.7 g/dL (ref 13.0–17.0)
Immature Granulocytes: 1 %
Lymphocytes Relative: 24 %
Lymphs Abs: 2.2 10*3/uL (ref 0.7–4.0)
MCH: 27.6 pg (ref 26.0–34.0)
MCHC: 33.5 g/dL (ref 30.0–36.0)
MCV: 82.3 fL (ref 80.0–100.0)
Monocytes Absolute: 0.6 10*3/uL (ref 0.1–1.0)
Monocytes Relative: 6 %
Neutro Abs: 6.3 10*3/uL (ref 1.7–7.7)
Neutrophils Relative %: 67 %
Platelet Count: 110 10*3/uL — ABNORMAL LOW (ref 150–400)
RBC: 6.05 MIL/uL — ABNORMAL HIGH (ref 4.22–5.81)
RDW: 13.1 % (ref 11.5–15.5)
WBC Count: 9.3 10*3/uL (ref 4.0–10.5)
nRBC: 0 % (ref 0.0–0.2)

## 2020-12-17 NOTE — Progress Notes (Signed)
[place] Telephone:(336) [phone]   Fax:(336) [place] NOTE  Patient Care Team: Charlott Rakes, MD as PCP - General (Family Medicine)  Hematological/Oncological History 1) 04/21/2016: Labs showed WBC 12.0 (H), RBC 6.09 (H), Hgb 17.3 (H), Plt 132K 2) 04/19/2020: Labs showed WBC 9.5, RBC 6.29 (H), Hgb 17.8 (H), Plt 111K 3) 12/06/2020: Labs showed WBC 9.1, RBC 6.76 (H), Hgb 18.9 (H), Plt 120K 4) 12/17/2020: Established care with Dede Query PA-C   CHIEF COMPLAINTS/PURPOSE OF CONSULTATION:  "Polycythemia"  HISTORY OF PRESENTING ILLNESS:  [name] 40 y.o. male with medical history significant for COPD secondary to smoking, HTN, DM, DVT and PE, hyperlipidemia, Bipolar 1 disorder, ADHD and morbid obesity.   Patient presents today with his girlfriend. On exam today, patient reports that his energy levels are stable over the past several months. He is able to complete all his ADLs on his own including chores and self care. He reports a good appetite without any significant weight changes. Patient denies any nausea, vomiting or abdominal pain. He reports occasional episodes of diarrhea that he attributes to his diabetes medications. He denies any hematochezia or melena. Patient has chronic lower extremity pain from the knees to his ankles. The lower extremity pain is constant and secondary to swelling, diabetes induced neuropathy and history of DVT in right lower extremity. He reports not wearing compression stockings regularly. Patient has occasional episodes of dizziness, mainly positional without any presyncope or LOC. He has chronic SOB with exertion secondary to COPD. He denies any fevers, chills, night sweats, chest pain, cough, headaches, bleeding or easy bruising. He has no other complaints. Rest of 10 point ROS is below.   MEDICAL HISTORY:  Past Medical History:  Diagnosis Date  . ADHD   . ADHD (attention deficit hyperactivity disorder)   . Bipolar  1 disorder ([place])   . Bipolar disorder ([place])   . Diabetes mellitus without complication ([place])   . Hyperlipidemia   . Morbid obesity ([place])   . Obesity   . Panic attack   . Varicose veins of both lower extremities with pain     SURGICAL HISTORY: Past Surgical History:  Procedure Laterality Date  . surgery on meatus as a child      SOCIAL HISTORY: Social History   Socioeconomic History  . Marital status: Single    Spouse name: Not on file  . Number of children: Not on file  . Years of education: Not on file  . Highest education level: Not on file  Occupational History  . Not on file  Tobacco Use  . Smoking status: Current Every Day Smoker    Packs/day: 1.00    Types: Cigarettes  . Smokeless tobacco: Never Used  Vaping Use  . Vaping Use: Never used  Substance and Sexual Activity  . Alcohol use: Yes    Comment: Rarely  . Drug use: No  . Sexual activity: Not on file  Other Topics Concern  . Not on file  Social History Narrative   ** Merged History Encounter **       Social Determinants of Health   Financial Resource Strain: Not on file  Food Insecurity: Not on file  Transportation Needs: Not on file  Physical Activity: Not on file  Stress: Not on file  Social Connections: Not on file  Intimate Partner Violence: Not on file    FAMILY HISTORY: Family History  Problem Relation Age of Onset  . Rheum arthritis Mother   .  Diabetes Mother   . Heart failure Mother   . Stroke Mother   . Glaucoma Maternal Grandmother   . Lung cancer Paternal Grandmother        smoker    ALLERGIES:  is allergic to actos [pioglitazone], gabapentin, and lyrica [pregabalin].  MEDICATIONS:  Current Outpatient Medications  Medication Sig Dispense Refill  . albuterol (VENTOLIN HFA) 108 (90 Base) MCG/ACT inhaler Inhale 1-2 puffs into the lungs every 6 (six) hours as needed for wheezing or shortness of breath. 18 g 2  . ARIPiprazole (ABILIFY) 5 MG tablet Take 5 mg by mouth daily.    Marland Kitchen  atorvastatin (LIPITOR) 20 MG tablet Take 1 tablet (20 mg total) by mouth daily. 30 tablet 3  . Blood Glucose Monitoring Suppl (ONETOUCH VERIO) w/Device KIT Use to check blood sugar up to 3 times daily (E11.69, Z79.4) 1 kit 0  . busPIRone (BUSPAR) 7.5 MG tablet Take 7.5 mg by mouth 2 (two) times daily.    Marland Kitchen doxepin (SINEQUAN) 25 MG capsule Take 25 mg by mouth at bedtime.    Marland Kitchen doxepin (SINEQUAN) 50 MG capsule Take 50 mg by mouth at bedtime as needed.    . Dulaglutide (TRULICITY) 3 VE/7.2CN SOPN Inject 3 mg as directed once a week. 6 mL 3  . DULoxetine (CYMBALTA) 30 MG capsule Take 30 mg by mouth daily.    . DULoxetine (CYMBALTA) 60 MG capsule Take 90 mg by mouth daily.    Marland Kitchen glipiZIDE (GLUCOTROL) 10 MG tablet Take 2 tablets (20 mg total) by mouth 2 (two) times daily before a meal. 120 tablet 3  . glucose blood (ONETOUCH VERIO) test strip Use to check blood sugar up to 3 times daily (E11.69, Z79.4) 100 each 11  . Menthol 7.5 % PTCH Apply 1 patch topically as needed (pain).    Glory Rosebush Delica Lancets 47S MISC Use to check blood sugar up to 3 times daily (E11.69, Z79.4) 100 each 11  . pioglitazone (ACTOS) 15 MG tablet Take 1 tablet (15 mg total) by mouth daily. 90 tablet 1  . sildenafil (VIAGRA) 50 MG tablet Take 1 tablet (50 mg total) by mouth daily as needed for erectile dysfunction. At least 24 hours between doses 10 tablet 1  . traMADol (ULTRAM) 50 MG tablet Take 1 tablet (50 mg total) by mouth every 12 (twelve) hours as needed (for pain). 60 tablet 2  . warfarin (COUMADIN) 2.5 MG tablet TAKE BY MOUTH DAILY AS DIRECTED BY THE COUMADIN CLINIC. 30 tablet 2   No current facility-administered medications for this visit.    REVIEW OF SYSTEMS:   Constitutional: ( - ) fevers, ( - )  chills , ( - ) night sweats Eyes: ( - ) blurriness of vision, ( - ) double vision, ( - ) watery eyes Ears, nose, mouth, throat, and face: ( - ) mucositis, ( - ) sore throat Respiratory: ( - ) cough, ( + ) dyspnea, ( - )  wheezes Cardiovascular: ( - ) palpitation, ( - ) chest discomfort, ( + ) lower extremity swelling Gastrointestinal:  ( - ) nausea, ( - ) heartburn, ( - ) change in bowel habits Skin: ( - ) abnormal skin rashes Lymphatics: ( - ) new lymphadenopathy, ( - ) easy bruising Neurological: ( + ) numbness, ( - ) tingling, ( - ) new weaknesses Behavioral/Psych: ( - ) mood change, ( - ) new changes  All other systems were reviewed with the patient and are negative.  PHYSICAL EXAMINATION:  ECOG PERFORMANCE STATUS: 1 - Symptomatic but completely ambulatory  Vitals:   12/17/20 1310  BP: 128/79  Pulse: (!) 105  Resp: 18  Temp: 97.9 F (36.6 C)  SpO2: 99%   Filed Weights   12/17/20 1310  Weight: (!) 307 lb 1.6 oz (139.3 kg)    GENERAL: well appearing male in NAD  SKIN: texture, turgor are normal, no rashes or significant lesions. Venous stasis dermatitis in lower extremities EYES: conjunctiva are pink and non-injected, sclera clear  OROPHARYNX: no exudate, no erythema; lips, buccal mucosa, and tongue normal. Severe teeth decay. NECK: supple, non-tender LYMPH:  no palpable lymphadenopathy in the cervical, axillary or supraclavicular lymph nodes.  LUNGS: Bilateral, diffuse wheezing upon auscultation HEART: Tachycardic, regular rhythm and no murmurs. Lower extremity edema (L > R) ABDOMEN: soft, non-tender, non-distended, normal bowel sounds Musculoskeletal: no cyanosis of digits and no clubbing  PSYCH: alert & oriented x 3, fluent speech NEURO: no focal motor/sensory deficits  LABORATORY DATA:  I have reviewed the data as listed CBC Latest Ref Rng & Units 12/06/2020 04/19/2020 04/14/2019  WBC 3.4 - 10.8 x10E3/uL 9.1 9.5 5.9  Hemoglobin 13.0 - 17.7 g/dL 18.9(H) 17.8(H) 15.7  Hematocrit 37.5 - 51.0 % 57.0(H) 54.1(H) 47.5  Platelets 150 - 450 x10E3/uL 120(L) 111(L) 88(L)    CMP Latest Ref Rng & Units 12/06/2020 04/19/2020 04/14/2019  Glucose 65 - 99 mg/dL 398(H) 445(H) 374(H)  BUN 6 - 24 mg/dL 7  <5(L) 6  Creatinine 0.76 - 1.27 mg/dL 0.78 0.81 0.78  Sodium 134 - 144 mmol/L 140 133(L) 134(L)  Potassium 3.5 - 5.2 mmol/L 4.7 4.4 4.2  Chloride 96 - 106 mmol/L 97 94(L) 101  CO2 22 - 32 mmol/L - 28 24  Calcium 8.7 - 10.2 mg/dL 9.7 10.0 8.5(L)  Total Protein 6.0 - 8.5 g/dL 7.6 7.6 6.3(L)  Total Bilirubin 0.0 - 1.2 mg/dL 0.5 0.7 0.6  Alkaline Phos 44 - 121 IU/L 110 124 94  AST 0 - 40 IU/L 31 29 33  ALT 0 - 44 U/L - 46(H) 43    ASSESSMENT & PLAN MATHEAU ORONA is a 40 y.o. male presenting to the clinic for evaluation for polycythemia. Discussed differentials including renal dysfunction, smoking, OSA, malignancy and myeloproliferative disorders. After review of labs, history and physical; the likely cause is chronic smoking. I will order additional labs for further workup including CBC with diff, erythropoietin, BCR-ABL, and MPN panel.   #Polycythemia --Likely etiology is chronic smoking as patient reports smoking 1 pack/day. Recommend smoking cessation.  --Will rule out other etiologies by obtaining labs (CBC, erythropoietin, BCR-ABL and MPN panel). --RTC in 3 months with labs unless further workup/intervention is needed.   #Chronic smoking --Patient reports that he plans to completely quit in June 2022. --Suggested a referral to tobacco cessation program for additional assistance. Patient is in agreement so referral was sent.   #History of DVT and PE: --05/22/2016: DVT of left lower extremity involving common femoral, femoral,popliteal and saphenofemoral veins. Treated with xarelto for one year.  --11/27/2018: Bilateral PE. Patient was treated prior with doxycycline for pneumonia. Treated PE with lovenox >> coumadin.  --His family history is remarkable for his mother who had a DVT in her 57s.  --Risk factors include venous insufficiency, smoking, and sedentary lifestyle. --Patient will remain on lifelong anticoagulation due to risk factors mentioned above, multiple DVT/PE episodes,  and family history of DVT.   #Thrombocytopenia:  --Likely etiology is possible hepatic cirrhosis and splenomegaly as mentioned on CT  scan from 11/27/2018.  --Patient denies any bleeding or easy bruising --If levels worsen, consider repeat imaging to further evaluate.    Orders Placed This Encounter  Procedures  . JAK2 (INCLUDING V617F AND EXON 12), MPL,& CALR W/RFL MPN PANEL (NGS)    Standing Status:   Future    Number of Occurrences:   1    Standing Expiration Date:   12/17/2021  . BCR ABL1 FISH ([facility])    Standing Status:   Future    Number of Occurrences:   1    Standing Expiration Date:   12/17/2021  . Erythropoietin    Standing Status:   Future    Number of Occurrences:   1    Standing Expiration Date:   12/17/2021  . CBC with Differential (Cancer Center Only)    Standing Status:   Future    Number of Occurrences:   1    Standing Expiration Date:   12/17/2021  . Ambulatory referral to Smoking Cessation Program    Referral Priority:   Routine    Referral Type:   Consultation    Referral Reason:   Specialty Services Required    Number of Visits Requested:   1    All questions were answered. The patient knows to call the clinic with any problems, questions or concerns.  A total of 60 minutes were spent on this encounter and over half of that time was spent on counseling and coordination of care as outlined above.    Dede Query, PA-C Department of Hematology/Oncology [place] [facility] Phone: [phone]

## 2020-12-18 LAB — ERYTHROPOIETIN: Erythropoietin: 16.6 m[IU]/mL (ref 2.6–18.5)

## 2020-12-19 ENCOUNTER — Telehealth: Payer: Self-pay | Admitting: Physician Assistant

## 2020-12-19 NOTE — Telephone Encounter (Signed)
Left message with upcoming appointment per 3/21 los. Gave option to call back to reschedule if needed.

## 2020-12-20 ENCOUNTER — Other Ambulatory Visit: Payer: Self-pay

## 2020-12-20 ENCOUNTER — Ambulatory Visit: Payer: Medicare Other | Attending: Family Medicine | Admitting: Pharmacist

## 2020-12-20 DIAGNOSIS — I825Y2 Chronic embolism and thrombosis of unspecified deep veins of left proximal lower extremity: Secondary | ICD-10-CM

## 2020-12-20 DIAGNOSIS — I2699 Other pulmonary embolism without acute cor pulmonale: Secondary | ICD-10-CM | POA: Diagnosis not present

## 2020-12-20 LAB — POCT INR: INR: 2.8 (ref 2.0–3.0)

## 2020-12-24 ENCOUNTER — Other Ambulatory Visit: Payer: Self-pay

## 2020-12-24 ENCOUNTER — Other Ambulatory Visit: Payer: Self-pay | Admitting: Family Medicine

## 2020-12-24 ENCOUNTER — Encounter: Payer: Self-pay | Admitting: Podiatry

## 2020-12-24 ENCOUNTER — Ambulatory Visit (INDEPENDENT_AMBULATORY_CARE_PROVIDER_SITE_OTHER): Payer: Medicare Other | Admitting: Podiatry

## 2020-12-24 DIAGNOSIS — E1165 Type 2 diabetes mellitus with hyperglycemia: Secondary | ICD-10-CM

## 2020-12-24 DIAGNOSIS — M79674 Pain in right toe(s): Secondary | ICD-10-CM

## 2020-12-24 DIAGNOSIS — L608 Other nail disorders: Secondary | ICD-10-CM

## 2020-12-24 DIAGNOSIS — B351 Tinea unguium: Secondary | ICD-10-CM | POA: Diagnosis not present

## 2020-12-24 DIAGNOSIS — M79675 Pain in left toe(s): Secondary | ICD-10-CM

## 2020-12-24 DIAGNOSIS — Z794 Long term (current) use of insulin: Secondary | ICD-10-CM

## 2020-12-24 DIAGNOSIS — I83813 Varicose veins of bilateral lower extremities with pain: Secondary | ICD-10-CM

## 2020-12-24 MED FILL — DULoxetine HCL 60 MG CPEP: 60 | 30 days supply | Qty: 30 | Fill #1

## 2020-12-24 MED FILL — DULoxetine HCL 30 MG CPEP: 30 | 30 days supply | Qty: 30 | Fill #1

## 2020-12-24 MED FILL — glipiZIDE 10 MG TABS: 10 | 30 days supply | Qty: 120 | Fill #1

## 2020-12-24 NOTE — Telephone Encounter (Signed)
Requested medication (s) are due for refill today:  yes  Requested medication (s) are on the active medication list: yes  Last refill:  11/23/2020  Future visit scheduled: yes  Notes to clinic:  This refill cannot be delegated   Requested Prescriptions  Pending Prescriptions Disp Refills   warfarin (COUMADIN) 2.5 MG tablet [Pharmacy Med Name: WARFARIN SODIUM 2.5 MG TAB 2.5 Tablet] 30 tablet 2    Sig: TAKE BY MOUTH DAILY AS DIRECTED BY THE COUMADIN CLINIC.      Hematology:  Anticoagulants - warfarin Failed - 12/24/2020  9:59 AM      Failed - This refill cannot be delegated      Failed - If the patient is managed by Coumadin Clinic - route to their Pool. If not, forward to the provider.      Passed - INR in normal range and within 30 days    INR  Date Value Ref Range Status  12/20/2020 2.8 2.0 - 3.0 Final  04/19/2020 2.3 (H) 0.8 - 1.2 Final    Comment:    (NOTE) INR goal varies based on device and disease states. Performed at [facility], [address], [place], [place] [address]           Passed - Valid encounter within last 3 months    Recent Outpatient Visits           4 weeks ago Type 2 diabetes mellitus with hyperglycemia, with long-term current use of insulin (HCC)   [facility] [place], [place], MD   4 months ago Type 2 diabetes mellitus with hyperglycemia, with long-term current use of insulin (HCC)   [facility] [place], [place], MD   7 months ago Type 2 diabetes mellitus with hyperglycemia, with long-term current use of insulin (HCC)    [facility] [place], MD   11 months ago Non-compliance   [facility] [place], Odette Horns, MD   12 months ago Acute pulmonary embolism without acute cor pulmonale, unspecified pulmonary embolism type [facility])   [facility] [place], Cornelius Moras, RPH-CPP

## 2020-12-25 ENCOUNTER — Telehealth: Payer: Self-pay | Admitting: Physician Assistant

## 2020-12-25 LAB — JAK2 (INCLUDING V617F AND EXON 12), MPL,& CALR W/RFL MPN PANEL (NGS)

## 2020-12-25 LAB — BCR ABL1 FISH (GENPATH)

## 2020-12-25 MED FILL — WARFARIN SODIUM 2.5 MG TAB: 2.5 | 30 days supply | Qty: 30 | Fill #0

## 2020-12-25 NOTE — Telephone Encounter (Signed)
I called Bradley [name] to review the lab results after our consultation for polycythemia.  Reviewed that erythropoietin levels,BCR/ABL and MPN panel were unremarkable.  The likely etiology for polycythemia is chronic smoking.  I did inquire if patient snores to suggest obstructive sleep apnea as a possible cause.  Patient denies snoring but will check with his partner since he does sleep alone. I recommended smoking cessation and again offered tobacco cessation progression.  If patient is found to snore while asleep, I would recommend a sleep study to evaluate for obstructive sleep apnea.  Bradley [name] will return to our clinic in 3 months with repeat labs.  Patient expressed understanding and satisfaction with the plan provided.

## 2020-12-26 ENCOUNTER — Ambulatory Visit: Payer: Medicare Other | Admitting: Family Medicine

## 2020-12-27 ENCOUNTER — Encounter: Payer: Self-pay | Admitting: Oncology

## 2020-12-27 NOTE — Progress Notes (Signed)
Subjective:  Patient ID: Bradley [name], male    DOB: Sep 21, 1981,  MRN: [id number]  40 y.o. male presents with at risk foot care with history of diabetic neuropathy and painful thick toenails that are difficult to trim. Pain interferes with ambulation. Aggravating factors include wearing enclosed shoe gear. Pain is relieved with periodic professional debridement..    Patient's last blood glucose was 327 mg/dl.; his last A1c A1c was 13.6%.  Patient states he lost his mother since his last visit. He states he lost his compression hose when moving.   PCP: Charlott Rakes, MD and last visit was: 11/26/2020.  Review of Systems: Negative except as noted in the HPI.  Past Medical History:  Diagnosis Date  . ADHD   . ADHD (attention deficit hyperactivity disorder)   . Bipolar 1 disorder ([place])   . Bipolar disorder ([place])   . Diabetes mellitus without complication ([place])   . Hyperlipidemia   . Morbid obesity ([place])   . Obesity   . Panic attack   . Varicose veins of both lower extremities with pain    Past Surgical History:  Procedure Laterality Date  . surgery on meatus as a child     Patient Active Problem List   Diagnosis Date Noted  . Polycythemia 12/17/2020  . Hypertriglyceridemia 10/18/2020  . Pain due to onychomycosis of toenails of both feet 09/28/2019  . Diabetes mellitus without complication ([place]) 19/37/9024  . Venous stasis dermatitis of left lower extremity 09/28/2019  . Pulmonary emboli ([place]) 11/27/2018  . Acute pulmonary embolism without acute cor pulmonale, unspecified pulmonary embolism type ([place]) 11/27/2018  . Splenomegaly 11/27/2018  . Focal nodular hyperplasia of liver 11/27/2018  . Diabetic neuropathy ([place]) 05/12/2018  . Chronic pain syndrome 05/20/2017  . Bipolar disorder, current episode mixed, moderate ([place]) 03/04/2017  . Varicose veins of both lower extremities with pain 02/18/2017  . Chronic deep vein thrombosis (DVT) of proximal vein of left lower extremity  ([place]) 11/12/2016  . Smoking addiction 06/19/2016  . HTN (hypertension) 07/27/2013  . Morbid obesity ([place]) 07/27/2013  . Bipolar 1 disorder, mixed, moderate ([place]) 07/27/2013  . DM2 (diabetes mellitus, type 2) ([place]) 04/26/2013    Current Outpatient Medications:  .  warfarin (COUMADIN) 2.5 MG tablet, TAKE BY MOUTH DAILY AS DIRECTED BY THE COUMADIN CLINIC., Disp: 30 tablet, Rfl: 2 .  albuterol (VENTOLIN HFA) 108 (90 Base) MCG/ACT inhaler, Inhale 1-2 puffs into the lungs every 6 (six) hours as needed for wheezing or shortness of breath., Disp: 18 g, Rfl: 2 .  ARIPiprazole (ABILIFY) 5 MG tablet, Take 5 mg by mouth daily., Disp: , Rfl:  .  atorvastatin (LIPITOR) 20 MG tablet, Take 1 tablet (20 mg total) by mouth daily., Disp: 30 tablet, Rfl: 3 .  Blood Glucose Monitoring Suppl (ONETOUCH VERIO) w/Device KIT, Use to check blood sugar up to 3 times daily (E11.69, Z79.4), Disp: 1 kit, Rfl: 0 .  busPIRone (BUSPAR) 7.5 MG tablet, Take 7.5 mg by mouth 2 (two) times daily., Disp: , Rfl:  .  doxepin (SINEQUAN) 25 MG capsule, Take 25 mg by mouth at bedtime., Disp: , Rfl:  .  doxepin (SINEQUAN) 50 MG capsule, Take 50 mg by mouth at bedtime as needed., Disp: , Rfl:  .  Dulaglutide (TRULICITY) 3 OX/7.3ZH SOPN, Inject 3 mg as directed once a week., Disp: 6 mL, Rfl: 3 .  DULoxetine (CYMBALTA) 30 MG capsule, Take 30 mg by mouth daily., Disp: , Rfl:  .  DULoxetine (CYMBALTA) 60 MG capsule,  Take 90 mg by mouth daily., Disp: , Rfl:  .  glipiZIDE (GLUCOTROL) 10 MG tablet, Take 2 tablets (20 mg total) by mouth 2 (two) times daily before a meal., Disp: 120 tablet, Rfl: 3 .  glucose blood (ONETOUCH VERIO) test strip, Use to check blood sugar up to 3 times daily (E11.69, Z79.4), Disp: 100 each, Rfl: 11 .  Menthol 7.5 % PTCH, Apply 1 patch topically as needed (pain)., Disp: , Rfl:  .  OneTouch Delica Lancets 78G MISC, Use to check blood sugar up to 3 times daily (E11.69, Z79.4), Disp: 100 each, Rfl: 11 .  pioglitazone  (ACTOS) 15 MG tablet, Take 1 tablet (15 mg total) by mouth daily., Disp: 90 tablet, Rfl: 1 .  sildenafil (VIAGRA) 50 MG tablet, Take 1 tablet (50 mg total) by mouth daily as needed for erectile dysfunction. At least 24 hours between doses, Disp: 10 tablet, Rfl: 1 .  traMADol (ULTRAM) 50 MG tablet, Take 1 tablet (50 mg total) by mouth every 12 (twelve) hours as needed (for pain)., Disp: 60 tablet, Rfl: 2   Allergies  Allergen Reactions  . Actos [Pioglitazone] Other (See Comments)    Stomach cramps  . Gabapentin Other (See Comments)    Crying spells  . Lyrica [Pregabalin] Other (See Comments)    Makes the patient somnolent   Social History   Tobacco Use  Smoking Status Current Every Day Smoker  . Packs/day: 1.00  . Types: Cigarettes  Smokeless Tobacco Never Used   Objective:  There were no vitals filed for this visit. Constitutional Patient is a pleasant 40 y.o. Caucasian male morbidly obese in NAD.Marland Kitchen AAO x 3.  Vascular Capillary refill time to digits immediate b/l. Palpable pedal pulses b/l LE. Pedal hair absent. Lower extremity skin temperature gradient within normal limits. No pain with calf compression b/l. Varicosities present b/l. No cyanosis or clubbing noted.  Neurologic Normal speech. Oriented to person, place, and time. Protective sensation intact 5/5 intact bilaterally with 10g monofilament b/l. Vibratory sensation intact b/l. Proprioception intact bilaterally.  Dermatologic Pedal skin with normal turgor, texture and tone bilaterally. No open wounds bilaterally. No interdigital macerations bilaterally. Toenails 1-5 b/l elongated, discolored, dystrophic, thickened, crumbly with subungual debris and tenderness to dorsal palpation. Incurvated nailplate b/l border(s) L hallux and R hallux.  Nail border hypertrophy minimal. There is tenderness to palpation. Sign(s) of infection: no clinical signs of infection noted on examination today.  Orthopedic: Normal muscle strength 5/5 to all  lower extremity muscle groups bilaterally. No pain crepitus or joint limitation noted with ROM b/l. No gross bony deformities bilaterally.   Hemoglobin A1C Latest Ref Rng & Units 08/09/2020 05/03/2020  HGBA1C 0.0 - 7.0 % 13.6(A) 13.2(A)  Some recent data might be hidden   Assessment:   1. Pain due to onychomycosis of toenails of both feet   2. Pincer nail deformity   3. Varicose veins of both lower extremities with pain   4. Type 2 diabetes mellitus with hyperglycemia, with long-term current use of insulin ([place])    Plan:  Patient was evaluated and treated and all questions answered.  Onychomycosis with pain -Nails palliatively debridement as below. -Educated on self-care  Procedure: Nail Debridement Rationale: Pain Type of Debridement: manual, sharp debridement. Instrumentation: Nail nipper, rotary burr. Number of Nails: 10  -Examined patient. -Continue diabetic foot care principles. -Toenails 1-5 b/l were debrided in length and girth with sterile nail nippers and dremel without iatrogenic bleeding.  -Offending nail border debrided and curretaged L  hallux and R hallux utilizing sterile nail nipper and currette. Border(s) cleansed with alcohol and triple antibiotic ointment applied. Patient instructed to apply triple antibiotic ointment to L hallux and R hallux once daily for 7 days. -Patient to report any pedal injuries to medical professional immediately. -Patient given information for [employer] in [place], [place], for affordable compression hose. Advised him to call before he goes as they may be appointment-only. He related understanding. -Patient/POA to call should there be question/concern in the interim.  Return in about 3 months (around 03/26/2021).  Marzetta Board, DPM

## 2021-01-02 ENCOUNTER — Other Ambulatory Visit: Payer: Self-pay

## 2021-01-02 DIAGNOSIS — F4312 Post-traumatic stress disorder, chronic: Secondary | ICD-10-CM | POA: Diagnosis not present

## 2021-01-02 DIAGNOSIS — F3132 Bipolar disorder, current episode depressed, moderate: Secondary | ICD-10-CM | POA: Diagnosis not present

## 2021-01-02 DIAGNOSIS — F411 Generalized anxiety disorder: Secondary | ICD-10-CM | POA: Diagnosis not present

## 2021-01-02 MED ORDER — DULOXETINE HCL 60 MG PO CPEP
ORAL_CAPSULE | ORAL | 1 refills | Status: DC
  Filled 2021-01-02 – 2021-01-23 (×2): qty 30, 30d supply, fill #0
  Filled 2021-02-20: qty 30, 30d supply, fill #1

## 2021-01-02 MED ORDER — BUSPIRONE HCL 7.5 MG PO TABS
ORAL_TABLET | ORAL | 1 refills | Status: DC
  Filled 2021-01-02: qty 60, 30d supply, fill #0

## 2021-01-02 MED ORDER — ARIPIPRAZOLE 5 MG PO TABS
ORAL_TABLET | ORAL | 1 refills | Status: DC
  Filled 2021-01-02 – 2021-01-04 (×2): qty 30, 30d supply, fill #0
  Filled 2021-02-11: qty 30, 30d supply, fill #1

## 2021-01-02 MED ORDER — DULOXETINE HCL 30 MG PO CPEP
ORAL_CAPSULE | ORAL | 1 refills | Status: DC
  Filled 2021-01-02 – 2021-01-23 (×2): qty 30, 30d supply, fill #0
  Filled 2021-02-20: qty 30, 30d supply, fill #1

## 2021-01-02 MED ORDER — DOXEPIN HCL 50 MG PO CAPS
ORAL_CAPSULE | ORAL | 1 refills | Status: DC
  Filled 2021-01-02: qty 30, 30d supply, fill #0

## 2021-01-03 ENCOUNTER — Other Ambulatory Visit: Payer: Self-pay

## 2021-01-03 ENCOUNTER — Encounter: Payer: Medicare Other | Attending: Internal Medicine | Admitting: Dietician

## 2021-01-03 ENCOUNTER — Encounter: Payer: Self-pay | Admitting: Dietician

## 2021-01-03 DIAGNOSIS — E119 Type 2 diabetes mellitus without complications: Secondary | ICD-10-CM | POA: Insufficient documentation

## 2021-01-03 NOTE — Progress Notes (Signed)
Diabetes Self-Management Education  Visit Type:  Follow-up  Appt. Start Time: 1610 Appt. End Time: 1640  01/03/2021  [name], identified by name and date of birth, is a 40 y.o. male with a diagnosis of Diabetes:  .   ASSESSMENT Patient is here today alone.  He was last seen by this RD 11/23/2020 He has switched to all diet soda Started to drink a bottle of water rather than soda when he goes to the gym most often. He has restarted going to the gym twice weekly for 30 minutes. He continues to smoke and has a quit date set for June 1.  He has left a message with the person that runs the no smoking program through [facility] Weight stable.  He is checking his blood sugar 3-4 times per week.  Blood sugar 327 before eating.  He states that he had pie the night before.  Blood glucose higher than 250 and 299 in the office today (fasting). Finances continue to be a concern. Discussed insulin.  He was on Basaglar in the past which cost less than $5.  Patient did not remember to take this.  We discussed having his girlfriend help him remember or taking after dinner.  Patient states that he is willing to try this again.  History includes Type 2 Diabetes (2019), DVT, Bipolar, ADHD, neuropathy, smoking, memory issues Labs noted to include:  A1C 13.6% 08/09/2020 increased from 13.2% 05/03/2020, cholesterol 193, Triglycerides 365, HDL 40, LDL 92 08/09/2020 Medications include:  Trulicity (his girlfriend reminds him), glipizide, coumadin, Abilify and other medications that contribute to weight gain. He stopped pioglitizone after 3 days due to stomach cramps. He could not tolerated Metformin due to bowel incontinence. He does not want to take insulin as he states that he cannot remember to take this.  His girlfriend tried to remind him but this did not increase consistency.   Fasting blood glucose up to 350 and decreases throughout the day to low to mid 200's. Sleep:  Eats late (10pm -  midnight).  Goes to sleep late.  Wakes about 10:30 am. Support:  [facility] [employer] [facility], patient of Dr. Lonzo Cloud at [facility], PCP Hoy Register, MD  Weight: 306 lbs 47/2022 306 lbs 11/23/2020 425 lbs highest adult weight 2 years ago (loss presumably due to uncontrolled blood sugar) Lowest adult weight 235 lbs about 8 years ago and he does not recall circumstances of increased weight gain.  Patient lives with his girlfriend.  He does the shopping and cooking. He is on disability.  Finances are a concern. His mother passed away this week from complications of diabetes. He is planning on quitting smoking and has a support system of people that are willing to quit with him. Goes to the gym 3 times per week [facility]). Eats 1 meal per day and increased snacks.  Does not like vegetables much.  He does not drink water. There were no vitals taken for this visit. There is no height or weight on file to calculate BMI.    Diabetes Self-Management Education - 01/03/21 1706      Pre-Education Assessment   Patient understands the diabetes disease and treatment process. Needs Review    Patient understands incorporating nutritional management into lifestyle. Needs Review    Patient undertands incorporating physical activity into lifestyle. Needs Review    Patient understands using medications safely. Needs Review    Patient understands monitoring blood glucose, interpreting and using results Needs Review  Patient understands prevention, detection, and treatment of acute complications. Needs Review    Patient understands prevention, detection, and treatment of chronic complications. Needs Review    Patient understands how to develop strategies to address psychosocial issues. Needs Review    Patient understands how to develop strategies to promote health/change behavior. Needs Review      Complications   How often do you check your blood sugar? 3-4 times /  week    Fasting Blood glucose range (mg/dL) >703    Postprandial Blood glucose range (mg/dL) >500    Number of hypoglycemic episodes per month 0    Number of hyperglycemic episodes per week 21      Dietary Intake   Breakfast 2 English Muffins (plain)    Snack (morning) none    Lunch none    Snack (afternoon) none    Dinner pork chops and 1 1/2 cups instant mashed potatoes    Snack (evening) none    Beverage(s) 6 cans diet Mt. Dew daily, 1 bottle of water a couple times of week.  Occasional coffee with splenda and cream, very rare alcohol      Exercise   Exercise Type Light (walking / raking leaves)    How many days per week to you exercise? 2    How many minutes per day do you exercise? 30    Total minutes per week of exercise 60      Patient Education   Previous Diabetes Education Yes (please comment)   11/23/2020   Nutrition management  Other (comment)   review of his food choices and alternative options   Physical activity and exercise  Role of exercise on diabetes management, blood pressure control and cardiac health.    Medications Reviewed patients medication for diabetes, action, purpose, timing of dose and side effects.    Monitoring Other (comment)   assessed how he takes this   Psychosocial adjustment Worked with patient to identify barriers to care and solutions;Identified and addressed patients feelings and concerns about diabetes    Personal strategies to promote health Review risk of smoking and offered smoking cessation      Individualized Goals (developed by patient)   Nutrition General guidelines for healthy choices and portions discussed    Physical Activity Exercise 3-5 times per week;30 minutes per day    Medications take my medication as prescribed    Monitoring  test my blood glucose as discussed    Reducing Risk increase portions of healthy fats;stop smoking;examine blood glucose patterns      Patient Self-Evaluation of Goals - Patient rates self as meeting  previously set goals (% of time)   Nutrition 25 - 50%    Physical Activity 50 - 75 %    Medications >75%    Monitoring 25 - 50%    Problem Solving 50 - 75 %    Reducing Risk 50 - 75 %    Health Coping >75%      Post-Education Assessment   Patient understands the diabetes disease and treatment process. Demonstrates understanding / competency    Patient understands incorporating nutritional management into lifestyle. Needs Review    Patient undertands incorporating physical activity into lifestyle. Needs Review    Patient understands using medications safely. Needs Review    Patient understands monitoring blood glucose, interpreting and using results Needs Review    Patient understands prevention, detection, and treatment of acute complications. Demonstrates understanding / competency    Patient understands prevention, detection, and treatment of  chronic complications. Demonstrates understanding / competency    Patient understands how to develop strategies to address psychosocial issues. Needs Review    Patient understands how to develop strategies to promote health/change behavior. Needs Review      Outcomes   Program Status Not Completed      Subsequent Visit   Since your last visit have you continued or begun to take your medications as prescribed? Yes    Since your last visit have you experienced any weight changes? No change           Learning Objective:  Patient will have a greater understanding of diabetes self-management. Patient education plan is to attend individual and/or group sessions per assessed needs and concerns.   Plan:   Patient Instructions  Consider insulin More water rather than diet soda. Continue to stay active.  Consider doing something that is physical active most days of the week for 30 minutes.  Continue to follow up regarding stopping smoking.  Continue the changes that you have made!    Expected Outcomes:  Other (comment) (demonstrated  interest.  change is difficult.)  Education material provided:   If problems or questions, patient to contact team via:  Phone  Future DSME appointment: - 2 months

## 2021-01-03 NOTE — Patient Instructions (Addendum)
Consider insulin More water rather than diet soda. Continue to stay active.  Consider doing something that is physical active most days of the week for 30 minutes.  Continue to follow up regarding stopping smoking.  Continue the changes that you have made!

## 2021-01-04 ENCOUNTER — Other Ambulatory Visit: Payer: Self-pay

## 2021-01-04 ENCOUNTER — Other Ambulatory Visit: Payer: Self-pay | Admitting: Family Medicine

## 2021-01-04 NOTE — Telephone Encounter (Signed)
Requested medication (s) are due for refill today: no  Requested medication (s) are on the active medication list: yes  Last refill: 08/06/2020  Future visit scheduled: yes  Notes to clinic:  this refill cannot be delegated    Requested Prescriptions  Pending Prescriptions Disp Refills   ARIPiprazole (ABILIFY) 5 MG tablet 30 tablet 0    Sig: TAKE 1 TABLET(S) BY MOUTH AT BEDTIME FOR MOOD      Not Delegated - Psychiatry:  Antipsychotics - Second Generation (Atypical) - aripiprazole Failed - 01/04/2021  9:45 AM      Failed - This refill cannot be delegated      Passed - Valid encounter within last 6 months    Recent Outpatient Visits           1 month ago Type 2 diabetes mellitus with hyperglycemia, with long-term current use of insulin (HCC)   [facility] [place], [place], MD   4 months ago Type 2 diabetes mellitus with hyperglycemia, with long-term current use of insulin (HCC)   [facility] [place], [place], MD   8 months ago Type 2 diabetes mellitus with hyperglycemia, with long-term current use of insulin (HCC)   [facility] Hoy Register, MD   11 months ago Non-compliance   [facility] [place], Odette Horns, MD   1 year ago Acute pulmonary embolism without acute cor pulmonale, unspecified pulmonary embolism type [facility])   [facility] [place], Cornelius Moras, RPH-CPP

## 2021-01-07 ENCOUNTER — Other Ambulatory Visit: Payer: Self-pay

## 2021-01-07 MED FILL — Atorvastatin Calcium Tab 20 MG (Base Equivalent): ORAL | 30 days supply | Qty: 30 | Fill #0 | Status: AC

## 2021-01-09 ENCOUNTER — Ambulatory Visit (INDEPENDENT_AMBULATORY_CARE_PROVIDER_SITE_OTHER): Payer: Medicare Other | Admitting: Internal Medicine

## 2021-01-09 ENCOUNTER — Other Ambulatory Visit: Payer: Self-pay

## 2021-01-09 ENCOUNTER — Encounter: Payer: Self-pay | Admitting: Internal Medicine

## 2021-01-09 VITALS — BP 130/84 | HR 102 | Ht 72.0 in | Wt 301.5 lb

## 2021-01-09 DIAGNOSIS — Z794 Long term (current) use of insulin: Secondary | ICD-10-CM

## 2021-01-09 DIAGNOSIS — E1165 Type 2 diabetes mellitus with hyperglycemia: Secondary | ICD-10-CM | POA: Diagnosis not present

## 2021-01-09 LAB — POCT GLYCOSYLATED HEMOGLOBIN (HGB A1C): Hemoglobin A1C: 11.3 % — AB (ref 4.0–5.6)

## 2021-01-09 LAB — POCT GLUCOSE (DEVICE FOR HOME USE): POC Glucose: 327 mg/dl — AB (ref 70–99)

## 2021-01-09 MED ORDER — INSULIN PEN NEEDLE 31G X 5 MM MISC
1.0000 | Freq: Every day | 3 refills | Status: DC
  Filled 2021-01-09: qty 30, 30d supply, fill #0
  Filled 2021-01-09: qty 100, 90d supply, fill #0
  Filled 2021-04-17: qty 100, 90d supply, fill #1

## 2021-01-09 MED ORDER — TRESIBA FLEXTOUCH 100 UNIT/ML ~~LOC~~ SOPN
26.0000 [IU] | PEN_INJECTOR | Freq: Every day | SUBCUTANEOUS | 6 refills | Status: DC
  Filled 2021-01-09: qty 15, 57d supply, fill #0
  Filled 2021-02-28: qty 15, 57d supply, fill #1

## 2021-01-09 NOTE — Patient Instructions (Addendum)
-   Stop  Glipizide  - Continue Trulicity 3 mg weekly  - Start Tresiba 26 units once daily       HOW TO TREAT LOW BLOOD SUGARS (Blood sugar LESS THAN 70 MG/DL)  Please follow the RULE OF 15 for the treatment of hypoglycemia treatment (when your (blood sugars are less than 70 mg/dL)    STEP 1: Take 15 grams of carbohydrates when your blood sugar is low, which includes:   3-4 GLUCOSE TABS  OR  3-4 OZ OF JUICE OR REGULAR SODA OR  ONE TUBE OF GLUCOSE GEL     STEP 2: RECHECK blood sugar in 15 MINUTES STEP 3: If your blood sugar is still low at the 15 minute recheck --> then, go back to STEP 1 and treat AGAIN with another 15 grams of carbohydrates.

## 2021-01-09 NOTE — Progress Notes (Signed)
Name: Bradley [name]  Age/ Sex: 40 y.o., male   MRN/ DOB: [id number], [birth year]     PCP: Charlott Rakes, MD   Reason for Endocrinology Evaluation: Type 2 Diabetes Mellitus  Initial Endocrine Consultative Visit: 10/18/2020    PATIENT IDENTIFIER: Bradley [name] is a 40 y.o. male with a past medical history of T2Dm, DVT and bipolar disorder. The patient has followed with Endocrinology clinic since 10/18/2020 for consultative assistance with management of his diabetes.  DIABETIC HISTORY:  Bradley [name] was diagnosed with DM yrs ago. Basaglar made him feel sluggish. His hemoglobin A1c has ranged from 7.7% in 2019, peaking at 14.9% in 2021.   On his initial visit to our clinic his A1c was 13.6 %, he DECLINED insulin. He was on Glipizide and Trulicity, we increased Glipizide, Trulicity and added Pioglitazone. By the time he went  To see our RD in 12/2020 he was off pioglitazone  Due to abdominal cramps     Lives with girlfriend   He is disable led for mental health SUBJECTIVE:   During the last visit (10/18/2020): 13.6% We increased Glipizide, Trulicity and added pioglitazone   Today (01/09/2021): Bradley [name] is here for a follow up on diabetes management.  He checks his blood sugars 1 times every other day.  The patient has not had hypoglycemic episodes since the last clinic visit. Denies abdomina pain or nausea    HOME DIABETES REGIMEN:  Glipizide 10 mg, Two tablets before the first meal of the day and 2 tablets before the last meal of the day  Trulicity  3 mg weekly       Statin: no ACE-I/ARB: no Prior Diabetic Education: yes   METER DOWNLOAD SUMMARY: Did not bring    DIABETIC COMPLICATIONS: Microvascular complications:   neuropathy  Denies: CKD retinopathy  Last Eye Exam: Completed 08/2020  Macrovascular complications:    Denies: CAD, CVA, PVD   HISTORY:  Past Medical History:  Past Medical History:  Diagnosis Date  . ADHD   . ADHD (attention  deficit hyperactivity disorder)   . Bipolar 1 disorder ([place])   . Bipolar disorder ([place])   . Diabetes mellitus without complication ([place])   . Hyperlipidemia   . Morbid obesity ([place])   . Obesity   . Panic attack   . Varicose veins of both lower extremities with pain    Past Surgical History:  Past Surgical History:  Procedure Laterality Date  . surgery on meatus as a child      Social History:  reports that he has been smoking cigarettes. He has been smoking about 1.00 pack per day. He has never used smokeless tobacco. He reports current alcohol use. He reports that he does not use drugs. Family History:  Family History  Problem Relation Age of Onset  . Rheum arthritis Mother   . Diabetes Mother   . Heart failure Mother   . Stroke Mother   . Glaucoma Maternal Grandmother   . Lung cancer Paternal Grandmother        smoker     HOME MEDICATIONS: Allergies as of 01/09/2021      Reactions   Actos [pioglitazone] Other (See Comments)   Stomach cramps   Gabapentin Other (See Comments)   Crying spells   Lyrica [pregabalin] Other (See Comments)   Makes the patient somnolent      Medication List       Accurate as of January 09, 2021 12:30 PM. If you have any questions, ask your  nurse or doctor.        albuterol 108 (90 Base) MCG/ACT inhaler Commonly known as: VENTOLIN HFA INHALE 1-2 PUFFS INTO THE LUNGS EVERY 6 (SIX) HOURS AS NEEDED FOR WHEEZING OR SHORTNESS OF BREATH.   ARIPiprazole 5 MG tablet Commonly known as: ABILIFY TAKE 1 TABLET(S) BY MOUTH AT BEDTIME FOR MOOD   ARIPiprazole 5 MG tablet Commonly known as: ABILIFY TAKE 1 TABLET(S) BY MOUTH AT BEDTIME FOR MOOD   ARIPiprazole 5 MG tablet Commonly known as: ABILIFY TAKE 1 TABLET(S) BY MOUTH AT BEDTIME FOR MOOD   ARIPiprazole 5 MG tablet Commonly known as: ABILIFY TAKE 1 TABLET(S) BY MOUTH AT BEDTIME FOR MOOD   ARIPiprazole 5 MG tablet Commonly known as: ABILIFY Take 1 tablet(s) by mouth at bedtime for mood    atorvastatin 20 MG tablet Commonly known as: LIPITOR TAKE 1 TABLET (20 MG TOTAL) BY MOUTH DAILY.   busPIRone 7.5 MG tablet Commonly known as: BUSPAR Take 7.5 mg by mouth 2 (two) times daily.   busPIRone 7.5 MG tablet Commonly known as: BUSPAR TAKE 1 TABLET(S) BY MOUTH TWICE A DAY FOR SEVERE ANXIETY/PANIC ATTACKS   busPIRone 7.5 MG tablet Commonly known as: BUSPAR TAKE 1 TABLET(S) BY MOUTH TWICE A DAY FOR SEVERE ANXIETY/PANIC ATTACKS   busPIRone 7.5 MG tablet Commonly known as: BUSPAR Take 1 tablet(s) by mouth twice a day for severe anxiety/panic attacks   doxepin 25 MG capsule Commonly known as: SINEQUAN Take 25 mg by mouth at bedtime.   doxepin 25 MG capsule Commonly known as: SINEQUAN TAKE 1 CAPSULE(S) BY MOUTH AT BEDTIME AS NEEDED FOR SLEEP   doxepin 50 MG capsule Commonly known as: SINEQUAN Take 50 mg by mouth at bedtime as needed.   doxepin 50 MG capsule Commonly known as: SINEQUAN TAKE 1 CAPSULE(S) BY MOUTH AT BEDTIME AS NEEDED FOR SLEEP   doxepin 50 MG capsule Commonly known as: SINEQUAN TAKE 1 CAPSULE(S) BY MOUTH AT BEDTIME AS NEEDED FOR SLEEP   doxepin 50 MG capsule Commonly known as: SINEQUAN Take 1 capsule(s) by mouth at bedtime as needed for sleep   DULoxetine 60 MG capsule Commonly known as: CYMBALTA Take 90 mg by mouth daily.   DULoxetine 30 MG capsule Commonly known as: CYMBALTA Take 30 mg by mouth daily.   DULoxetine 30 MG capsule Commonly known as: CYMBALTA TAKE 1 CAPSULE(S) BY MOUTH DAILY FOR ANXIETY/DEPRESSION   DULoxetine 60 MG capsule Commonly known as: CYMBALTA TAKE 1 CAPSULE(S) BY MOUTH AT BEDTIME FOR ANXIETY/DEPRESSION   DULoxetine 30 MG capsule Commonly known as: CYMBALTA TAKE 1 CAPSULE(S) BY MOUTH DAILY FOR ANXIETY/DEPRESSION   DULoxetine 60 MG capsule Commonly known as: CYMBALTA TAKE 1 CAPSULE(S) BY MOUTH AT BEDTIME FOR ANXIETY/DEPRESSION   DULoxetine 30 MG capsule Commonly known as: CYMBALTA TAKE 1 CAPSULE(S) BY MOUTH  DAILY FOR ANXIETY/DEPRESSION   DULoxetine 60 MG capsule Commonly known as: CYMBALTA TAKE 1 CAPSULE(S) BY MOUTH AT BEDTIME FOR ANXIETY/DEPRESSION   DULoxetine 30 MG capsule Commonly known as: CYMBALTA Take 1 capsule(s) by mouth daily for anxiety/depression   DULoxetine 60 MG capsule Commonly known as: CYMBALTA Take 1 capsule(s) by mouth at bedtime for anxiety/depression   glipiZIDE 10 MG tablet Commonly known as: GLUCOTROL TAKE 2 TABLETS (20 MG TOTAL) BY MOUTH 2 (TWO) TIMES DAILY BEFORE A MEAL.   glucose blood test strip Commonly known as: OneTouch Verio Use to check blood sugar up to 3 times daily (E11.69, Z79.4)   Insulin Pen Needle 31G X 5 MM Misc 1 Device  by Does not apply route daily. Started by: Dorita Sciara, MD   Menthol 7.5 % Ptch Apply 1 patch topically as needed (pain).   OneTouch Delica Lancets 97C Misc Use to check blood sugar up to 3 times daily (E11.69, Z79.4)   OneTouch Verio w/Device Kit Use to check blood sugar up to 3 times daily (E11.69, Z79.4)   pioglitazone 15 MG tablet Commonly known as: ACTOS TAKE 1 TABLET (15 MG TOTAL) BY MOUTH DAILY.   sildenafil 50 MG tablet Commonly known as: Viagra Take 1 tablet (50 mg total) by mouth daily as needed for erectile dysfunction. At least 24 hours between doses   traMADol 50 MG tablet Commonly known as: ULTRAM TAKE 1 TABLET (50 MG TOTAL) BY MOUTH EVERY 12 (TWELVE) HOURS AS NEEDED (FOR PAIN).   Bradley [name] FlexTouch 100 UNIT/ML FlexTouch Pen Generic drug: insulin degludec Inject 26 Units into the skin daily. Started by: Dorita Sciara, MD   Trulicity 3 BU/3.8GT Sopn Generic drug: Dulaglutide INJECT 3 MG AS DIRECTED ONCE A WEEK.   warfarin 2.5 MG tablet Commonly known as: COUMADIN Take as directed by the anticoagulation clinic. If you are unsure how to take this medication, talk to your nurse or doctor. Original instructions: TAKE BY MOUTH DAILY AS DIRECTED BY THE COUMADIN CLINIC.         OBJECTIVE:   Vital Signs: BP 130/84   Pulse (!) 102   Ht 6' (1.829 m)   Wt (!) 301 lb 8 oz (136.8 kg)   SpO2 98%   BMI 40.89 kg/m   Wt Readings from Last 3 Encounters:  01/09/21 (!) 301 lb 8 oz (136.8 kg)  12/17/20 (!) 307 lb 1.6 oz (139.3 kg)  11/23/20 (!) 306 lb (138.8 kg)     Exam: General: Pt appears well and is in NAD  Lungs: Clear with good BS bilat with no rales, rhonchi, or wheezes  Heart: RRR with normal S1 and S2 and no gallops; no murmurs; no rub  Abdomen: Normoactive bowel sounds, soft, nontender, without masses or organomegaly palpable  Extremities: No pretibial edema.  Stasis dermatitis over the shins  Neuro: MS is good with appropriate affect, pt is alert and Ox3     DM foot exam: 10/18/2020  The skin of the feet is intact without sores or ulcerations. The pedal pulses are 2+ on right and 2+ on left. The sensation is decrease to a screening 5.07, 10 gram monofilament bilaterally    DATA REVIEWED:  Lab Results  Component Value Date   HGBA1C 11.3 (A) 01/09/2021   HGBA1C 13.6 (A) 08/09/2020   HGBA1C 13.2 (A) 05/03/2020   Lab Results  Component Value Date   MICROALBUR 0.5 06/19/2016   LDLCALC 98 12/06/2020   CREATININE 0.78 12/06/2020   Lab Results  Component Value Date   MICRALBCREAT 20 12/06/2020     Lab Results  Component Value Date   CHOL 185 12/06/2020   HDL 47 12/06/2020   LDLCALC 98 12/06/2020   TRIG 236 (H) 12/06/2020   CHOLHDL 3.9 12/06/2020         ASSESSMENT / PLAN / RECOMMENDATIONS:   1) Type 2 Diabetes Mellitus, Poorly controlled, With Neuropathic  complications - Most recent A1c of 11.3%. Goal A1c < 7.0 %.   -His A1c has trended down from 13.9% to 11.3%, the patient has made lifestyle changes which I have praised him for encouraged him to continue with.  He understand that this is a good progress but more work  is needed to get to a goal of 7.0% -He is intolerant to pioglitazone due to abdominal pain -On his initial  visit to our clinic he declined insulin but today he is open to this idea -We are going to stop the glipizide and start him on basal insulin as below    MEDICATIONS:  Stop glipizide  Continue Trulicity 3 mg daily  Start Tresiba 26 units daily  EDUCATION / INSTRUCTIONS:  BG monitoring instructions: Patient is instructed to check his blood sugars 1 times a day, fasting.  Call [facility] if: BG persistently < 70  . I reviewed the Rule of 15 for the treatment of hypoglycemia in detail with the patient. Literature supplied.    2) Diabetic complications:   Eye: Does not have known diabetic retinopathy.   Neuro/ Feet: Does  have known diabetic peripheral neuropathy .   Renal: Patient does not have known baseline CKD. He   is not on an ACEI/ARB at present.      3) Hypertriglyceridemia : We had discussed ADA recommendations in statin use between ages 93-75 . We had discussed cardiovascular benefits of statins.  We will consider initiating statin therapy in the future.    F/U in 3 months   Signed electronically by: Mack Guise, MD  [facility]  [facility] [address] [place], [place] [address] Phone: [phone] FAX: [phone]   CC: Charlott Rakes, [place] [address] Phone: [phone]  Fax: [phone]  Return to Endocrinology clinic as below: Future Appointments  Date Time Provider [place]  01/17/2021  2:30 PM Tresa Endo, RPH-CPP [facility] None  02/21/2021  4:15 PM Valora Piccolo Barnabas Lister, RD [place] [facility]DM  03/19/2021 12:30 PM [facility] None  03/19/2021  1:00 PM Lincoln Brigham, PA-C [facility] None  04/08/2021  3:15 PM Marzetta Board, DPM [facility]  04/15/2021  2:20 PM Kallan Bischoff, Melanie Crazier, MD [facility] None  05/21/2021  1:30 PM Bernarda Caffey, MD [facility] None

## 2021-01-10 ENCOUNTER — Other Ambulatory Visit: Payer: Self-pay

## 2021-01-14 ENCOUNTER — Other Ambulatory Visit: Payer: Self-pay

## 2021-01-14 MED FILL — Tramadol HCl Tab 50 MG: ORAL | 30 days supply | Qty: 60 | Fill #0 | Status: AC

## 2021-01-17 ENCOUNTER — Other Ambulatory Visit: Payer: Self-pay

## 2021-01-17 ENCOUNTER — Ambulatory Visit: Payer: Medicare Other | Attending: Family Medicine | Admitting: Pharmacist

## 2021-01-17 DIAGNOSIS — I825Y2 Chronic embolism and thrombosis of unspecified deep veins of left proximal lower extremity: Secondary | ICD-10-CM

## 2021-01-17 DIAGNOSIS — I2699 Other pulmonary embolism without acute cor pulmonale: Secondary | ICD-10-CM

## 2021-01-17 LAB — POCT INR: INR: 2.8 (ref 2.0–3.0)

## 2021-01-23 ENCOUNTER — Other Ambulatory Visit: Payer: Self-pay

## 2021-01-23 MED FILL — Warfarin Sodium Tab 2.5 MG: ORAL | 30 days supply | Qty: 30 | Fill #0 | Status: AC

## 2021-01-24 ENCOUNTER — Other Ambulatory Visit: Payer: Self-pay

## 2021-01-28 ENCOUNTER — Other Ambulatory Visit: Payer: Self-pay

## 2021-02-05 ENCOUNTER — Other Ambulatory Visit: Payer: Self-pay

## 2021-02-11 ENCOUNTER — Other Ambulatory Visit: Payer: Self-pay

## 2021-02-11 MED FILL — Atorvastatin Calcium Tab 20 MG (Base Equivalent): ORAL | 30 days supply | Qty: 30 | Fill #1 | Status: AC

## 2021-02-12 ENCOUNTER — Other Ambulatory Visit: Payer: Self-pay | Admitting: Pharmacist

## 2021-02-12 DIAGNOSIS — E1149 Type 2 diabetes mellitus with other diabetic neurological complication: Secondary | ICD-10-CM

## 2021-02-12 NOTE — Progress Notes (Signed)
Patient called requesting refills for his tramadol. There are none remaining in our pharmacy. Will forward request to Dr. Alvis Lemmings.

## 2021-02-13 ENCOUNTER — Other Ambulatory Visit: Payer: Self-pay

## 2021-02-13 MED ORDER — TRAMADOL HCL 50 MG PO TABS
ORAL_TABLET | ORAL | 2 refills | Status: DC
  Filled 2021-02-13: qty 60, 30d supply, fill #0
  Filled 2021-03-15: qty 60, 30d supply, fill #1
  Filled 2021-04-11: qty 60, 30d supply, fill #2

## 2021-02-13 NOTE — Progress Notes (Signed)
Refilled

## 2021-02-14 ENCOUNTER — Other Ambulatory Visit: Payer: Self-pay

## 2021-02-14 ENCOUNTER — Ambulatory Visit: Payer: Medicare Other | Attending: Family Medicine | Admitting: Pharmacist

## 2021-02-14 DIAGNOSIS — I2699 Other pulmonary embolism without acute cor pulmonale: Secondary | ICD-10-CM | POA: Diagnosis not present

## 2021-02-14 DIAGNOSIS — I825Y2 Chronic embolism and thrombosis of unspecified deep veins of left proximal lower extremity: Secondary | ICD-10-CM

## 2021-02-14 LAB — POCT INR: INR: 2.4 (ref 2.0–3.0)

## 2021-02-14 NOTE — Progress Notes (Deleted)
TTR - 47.8%  INR at last visit WNL: 2.8  Goal: 2 -3  2.5 mg QD = 17.5 mg total  Pt had a PE   DVT 05/22/16 - tried xarelto  But due to obesity he was unable to use DOAC.  PE in 2020 - lovenox -coumadin bridge

## 2021-02-20 ENCOUNTER — Other Ambulatory Visit: Payer: Self-pay

## 2021-02-20 ENCOUNTER — Ambulatory Visit: Payer: Medicare Other | Admitting: Internal Medicine

## 2021-02-20 MED FILL — Warfarin Sodium Tab 2.5 MG: ORAL | 30 days supply | Qty: 30 | Fill #1 | Status: AC

## 2021-02-21 ENCOUNTER — Encounter: Payer: Medicare Other | Attending: Internal Medicine | Admitting: Dietician

## 2021-02-21 ENCOUNTER — Encounter: Payer: Self-pay | Admitting: Dietician

## 2021-02-21 DIAGNOSIS — E119 Type 2 diabetes mellitus without complications: Secondary | ICD-10-CM | POA: Insufficient documentation

## 2021-02-21 NOTE — Progress Notes (Signed)
Diabetes Self-Management Education  Visit Type: Follow-up  Appt. Start Time: 1645 Appt. End Time: 1715 This visit was completed via telephone .  Last RD visit 12/2020..   I spoke with Bradley [name] and verified that I was speaking with the correct person with two patient identifiers (full name and date of birth).   02/21/2021  [name], identified by name and date of birth, is a 40 y.o. male with a diagnosis of Diabetes:  .   ASSESSMENT Patient states that he feels "a lot better" since starting the Tresiba. He has been going to the gym twice a week for 30 minutes. Finances continue to be a problem.  He is out of the Trulicity and does not have the money to get this until 02/28/2021 after he gets his social security check.  Cost is $7 at [facility].  He has adequate food but nutritionally, they are not the highest quality.  He and his girlfriend dislike vegetables. He has lost to 295 lbs, decreased 11 lbs in the past 6 weeks. A1C 11.3% 01/09/2021 decreased from 13.6% 07/2020 He does not check his blood glucose consistently.  Glucose 245 HS about 3 nights ago.  He is not checking his fasting insulin at this time as he states that he is not alert enough when he wakes. He has started drinking water. Now eating 2-3 meals per day.  History includes Type 2 Diabetes (2019), DVT, Bipolar, ADHD, neuropathy, smoking, memory issues Labs noted to include: A1C 13.6% 08/09/2020 increased from 13.2% 05/03/2020, cholesterol 193, Triglycerides 365, HDL 40, LDL 92 08/09/2020 Medications include: Trulicity (his girlfriend reminds him), glipizide (discontinued), actos (discontinued), Tresiba 26 units q HS, coumadin, Abilify and other medications that contribute to weight gain. He stopped pioglitizone after 3 days due to stomach cramps. He could not tolerated Metformin due to bowel incontinence.  Sleep: Eats late (10pm - midnight). Goes to sleep late. Wakes about 10:30  am. Support: [facility], [facility], patient of Dr. Lonzo [name] at [facility], PCP Hoy Register, MD  Weight: 295 lbs 02/14/21 306 lbs 47/2022 306 lbs 11/23/2020 425 lbs highest adult weight 2 years ago (loss presumably due to uncontrolled blood sugar) Lowest adult weight 235 lbs about 8 years ago and he does not recall circumstances of increased weight gain.  Patient lives with his girlfriend. He does the shopping and cooking. He is on disability.Finances are a concern. His mother passed away 12-01-20 from complications of diabetes. He is planning on quitting smoking and has a support system of people that are willing to quit with him. Goes to the gym 2-3 times per week [facility]).    Diabetes Self-Management Education - 02/21/21 1826      Visit Information   Visit Type Follow-up      Initial Visit   Are you taking your medications as prescribed? Yes      Pre-Education Assessment   Patient understands the diabetes disease and treatment process. Needs Review    Patient understands incorporating nutritional management into lifestyle. Needs Review    Patient undertands incorporating physical activity into lifestyle. Needs Review    Patient understands using medications safely. Needs Review    Patient understands monitoring blood glucose, interpreting and using results Needs Review    Patient understands prevention, detection, and treatment of acute complications. Needs Review    Patient understands prevention, detection, and treatment of chronic complications. Needs Review    Patient understands how to develop strategies to address  psychosocial issues. Needs Review    Patient understands how to develop strategies to promote health/change behavior. Needs Review      Complications   Last HgB A1C per patient/outside source 11.3 %   01/09/2021   How often do you check your blood sugar? 3-4 times / week    Postprandial Blood glucose range  (mg/dL) >937   342   Number of hypoglycemic episodes per month 0      Dietary Intake   Breakfast Frosted MIni Wheats, skim milk    Lunch 2 hot pockets    Dinner spaghetti with 85% lean meat sauce    Beverage(s) diet soda, water, apple juice      Exercise   Exercise Type Light (walking / raking leaves)    How many days per week to you exercise? 2    How many minutes per day do you exercise? 30    Total minutes per week of exercise 60      Patient Education   Previous Diabetes Education Yes (please comment)   12/2020   Nutrition management  Other (comment)   affects of juice on blood sugar   Physical activity and exercise  Role of exercise on diabetes management, blood pressure control and cardiac health.    Medications Other (comment);Reviewed patients medication for diabetes, action, purpose, timing of dose and side effects.   insulin site rotation   Psychosocial adjustment Identified and addressed patients feelings and concerns about diabetes      Individualized Goals (developed by patient)   Nutrition General guidelines for healthy choices and portions discussed    Physical Activity Exercise 3-5 times per week;30 minutes per day    Medications take my medication as prescribed    Monitoring  test my blood glucose as discussed    Reducing Risk examine blood glucose patterns      Patient Self-Evaluation of Goals - Patient rates self as meeting previously set goals (% of time)   Nutrition 50 - 75 %    Physical Activity 50 - 75 %    Medications Not Applicable    Monitoring 25 - 50%    Problem Solving 50 - 75 %    Reducing Risk 50 - 75 %    Health Coping >75%      Post-Education Assessment   Patient understands the diabetes disease and treatment process. Demonstrates understanding / competency    Patient understands incorporating nutritional management into lifestyle. Needs Review    Patient undertands incorporating physical activity into lifestyle. Needs Review    Patient  understands using medications safely. Needs Review    Patient understands monitoring blood glucose, interpreting and using results Demonstrates understanding / competency    Patient understands prevention, detection, and treatment of acute complications. Demonstrates understanding / competency    Patient understands prevention, detection, and treatment of chronic complications. Demonstrates understanding / competency    Patient understands how to develop strategies to address psychosocial issues. Needs Review    Patient understands how to develop strategies to promote health/change behavior. Needs Review      Outcomes   Expected Outcomes Demonstrated interest in learning. Expect positive outcomes    Future DMSE 3-4 months    Program Status Not Completed      Subsequent Visit   Since your last visit have you continued or begun to take your medications as prescribed? Yes    Since your last visit have you experienced any weight changes? Loss    Weight Loss (lbs) 11  Individualized Plan for Diabetes Self-Management Training:   Learning Objective:  Patient will have a greater understanding of diabetes self-management. Patient education plan is to attend individual and/or group sessions per assessed needs and concerns.   Plan:   Patient Instructions  Rotate the Insulin Injection Site Great job on getting to the gym.  Consider using the pool after you do the usual exercises.     Expected Outcomes:  Demonstrated interest in learning. Expect positive outcomes  Education material provided:   If problems or questions, patient to contact team via:  Phone  Future DSME appointment: 3-4 months

## 2021-02-21 NOTE — Patient Instructions (Signed)
Rotate the Insulin Injection Site Great job on getting to the gym.  Consider using the pool after you do the usual exercises.

## 2021-02-22 ENCOUNTER — Other Ambulatory Visit: Payer: Self-pay

## 2021-02-28 ENCOUNTER — Other Ambulatory Visit: Payer: Self-pay

## 2021-03-04 ENCOUNTER — Other Ambulatory Visit: Payer: Self-pay

## 2021-03-04 DIAGNOSIS — F3132 Bipolar disorder, current episode depressed, moderate: Secondary | ICD-10-CM | POA: Diagnosis not present

## 2021-03-04 DIAGNOSIS — F411 Generalized anxiety disorder: Secondary | ICD-10-CM | POA: Diagnosis not present

## 2021-03-04 DIAGNOSIS — F4312 Post-traumatic stress disorder, chronic: Secondary | ICD-10-CM | POA: Diagnosis not present

## 2021-03-04 MED ORDER — DOXEPIN HCL 50 MG PO CAPS
ORAL_CAPSULE | ORAL | 2 refills | Status: DC
  Filled 2021-03-04 – 2021-03-11 (×2): qty 30, 30d supply, fill #0
  Filled 2021-04-08: qty 30, 30d supply, fill #1
  Filled 2021-05-08: qty 30, 30d supply, fill #2

## 2021-03-04 MED ORDER — ARIPIPRAZOLE 5 MG PO TABS
ORAL_TABLET | ORAL | 2 refills | Status: DC
  Filled 2021-03-04 – 2021-03-11 (×2): qty 30, 30d supply, fill #0
  Filled 2021-04-08: qty 30, 30d supply, fill #1
  Filled 2021-05-08: qty 30, 30d supply, fill #2

## 2021-03-04 MED ORDER — BUSPIRONE HCL 7.5 MG PO TABS
ORAL_TABLET | ORAL | 2 refills | Status: DC
  Filled 2021-03-04: qty 60, 30d supply, fill #0

## 2021-03-04 MED ORDER — DULOXETINE HCL 30 MG PO CPEP
ORAL_CAPSULE | ORAL | 2 refills | Status: DC
  Filled 2021-03-04 – 2021-03-25 (×2): qty 30, 30d supply, fill #0
  Filled 2021-04-25: qty 30, 30d supply, fill #1
  Filled 2021-05-23: qty 30, 30d supply, fill #2

## 2021-03-04 MED ORDER — DULOXETINE HCL 60 MG PO CPEP
ORAL_CAPSULE | ORAL | 2 refills | Status: DC
  Filled 2021-03-04 – 2021-03-25 (×2): qty 30, 30d supply, fill #0
  Filled 2021-04-21 – 2021-04-22 (×2): qty 30, 30d supply, fill #1
  Filled 2021-05-23: qty 30, 30d supply, fill #2

## 2021-03-11 ENCOUNTER — Other Ambulatory Visit: Payer: Self-pay

## 2021-03-11 MED FILL — Atorvastatin Calcium Tab 20 MG (Base Equivalent): ORAL | 30 days supply | Qty: 30 | Fill #2 | Status: AC

## 2021-03-12 ENCOUNTER — Other Ambulatory Visit: Payer: Self-pay

## 2021-03-15 ENCOUNTER — Other Ambulatory Visit: Payer: Self-pay

## 2021-03-18 ENCOUNTER — Other Ambulatory Visit: Payer: Self-pay

## 2021-03-18 ENCOUNTER — Other Ambulatory Visit: Payer: Self-pay | Admitting: Physician Assistant

## 2021-03-18 ENCOUNTER — Ambulatory Visit: Payer: Medicare Other | Attending: Family Medicine | Admitting: Pharmacist

## 2021-03-18 DIAGNOSIS — I2699 Other pulmonary embolism without acute cor pulmonale: Secondary | ICD-10-CM

## 2021-03-18 DIAGNOSIS — I825Y2 Chronic embolism and thrombosis of unspecified deep veins of left proximal lower extremity: Secondary | ICD-10-CM

## 2021-03-18 DIAGNOSIS — D751 Secondary polycythemia: Secondary | ICD-10-CM

## 2021-03-18 LAB — POCT INR: INR: 2.5 (ref 2.0–3.0)

## 2021-03-19 ENCOUNTER — Other Ambulatory Visit: Payer: Self-pay

## 2021-03-19 ENCOUNTER — Inpatient Hospital Stay: Payer: Medicare Other

## 2021-03-19 ENCOUNTER — Telehealth: Payer: Self-pay

## 2021-03-19 ENCOUNTER — Inpatient Hospital Stay: Payer: Medicare Other | Attending: Physician Assistant | Admitting: Physician Assistant

## 2021-03-19 VITALS — BP 118/71 | HR 115 | Temp 97.0°F | Resp 20 | Ht 72.0 in | Wt 291.6 lb

## 2021-03-19 DIAGNOSIS — D751 Secondary polycythemia: Secondary | ICD-10-CM | POA: Diagnosis not present

## 2021-03-19 DIAGNOSIS — F17210 Nicotine dependence, cigarettes, uncomplicated: Secondary | ICD-10-CM | POA: Diagnosis not present

## 2021-03-19 DIAGNOSIS — E119 Type 2 diabetes mellitus without complications: Secondary | ICD-10-CM | POA: Insufficient documentation

## 2021-03-19 DIAGNOSIS — Z86718 Personal history of other venous thrombosis and embolism: Secondary | ICD-10-CM | POA: Diagnosis not present

## 2021-03-19 DIAGNOSIS — Z79899 Other long term (current) drug therapy: Secondary | ICD-10-CM | POA: Diagnosis not present

## 2021-03-19 DIAGNOSIS — Z86711 Personal history of pulmonary embolism: Secondary | ICD-10-CM | POA: Insufficient documentation

## 2021-03-19 DIAGNOSIS — Z7901 Long term (current) use of anticoagulants: Secondary | ICD-10-CM | POA: Diagnosis not present

## 2021-03-19 DIAGNOSIS — Z794 Long term (current) use of insulin: Secondary | ICD-10-CM | POA: Diagnosis not present

## 2021-03-19 DIAGNOSIS — R739 Hyperglycemia, unspecified: Secondary | ICD-10-CM | POA: Diagnosis not present

## 2021-03-19 LAB — CBC WITH DIFFERENTIAL (CANCER CENTER ONLY)
Abs Immature Granulocytes: 0.04 10*3/uL (ref 0.00–0.07)
Basophils Absolute: 0.1 10*3/uL (ref 0.0–0.1)
Basophils Relative: 1 %
Eosinophils Absolute: 0.2 10*3/uL (ref 0.0–0.5)
Eosinophils Relative: 2 %
HCT: 50.7 % (ref 39.0–52.0)
Hemoglobin: 17.5 g/dL — ABNORMAL HIGH (ref 13.0–17.0)
Immature Granulocytes: 0 %
Lymphocytes Relative: 23 %
Lymphs Abs: 2.2 10*3/uL (ref 0.7–4.0)
MCH: 28.4 pg (ref 26.0–34.0)
MCHC: 34.5 g/dL (ref 30.0–36.0)
MCV: 82.2 fL (ref 80.0–100.0)
Monocytes Absolute: 0.6 10*3/uL (ref 0.1–1.0)
Monocytes Relative: 6 %
Neutro Abs: 6.3 10*3/uL (ref 1.7–7.7)
Neutrophils Relative %: 68 %
Platelet Count: 102 10*3/uL — ABNORMAL LOW (ref 150–400)
RBC: 6.17 MIL/uL — ABNORMAL HIGH (ref 4.22–5.81)
RDW: 13.3 % (ref 11.5–15.5)
WBC Count: 9.3 10*3/uL (ref 4.0–10.5)
nRBC: 0 % (ref 0.0–0.2)

## 2021-03-19 LAB — CMP (CANCER CENTER ONLY)
ALT: 33 U/L (ref 0–44)
AST: 20 U/L (ref 15–41)
Albumin: 3.9 g/dL (ref 3.5–5.0)
Alkaline Phosphatase: 83 U/L (ref 38–126)
Anion gap: 9 (ref 5–15)
BUN: 11 mg/dL (ref 6–20)
CO2: 27 mmol/L (ref 22–32)
Calcium: 9 mg/dL (ref 8.9–10.3)
Chloride: 97 mmol/L — ABNORMAL LOW (ref 98–111)
Creatinine: 0.99 mg/dL (ref 0.61–1.24)
GFR, Estimated: >60 mL/min (ref >60)
Glucose, Bld: 481 mg/dL — ABNORMAL HIGH (ref 70–99)
Potassium: 4.4 mmol/L (ref 3.5–5.1)
Sodium: 133 mmol/L — ABNORMAL LOW (ref 135–145)
Total Bilirubin: 0.6 mg/dL (ref 0.3–1.2)
Total Protein: 7.3 g/dL (ref 6.5–8.1)

## 2021-03-19 NOTE — Telephone Encounter (Signed)
Pts glucose is 481. I have called the pt to determine if he has taken his insulin today and was unable to reach him. I left a voice message requesting he call us back.  I have also called his PCP's office, Dr. Alvis Lemmings, and spoke with Belva Crome who took this message as well and they will also follow-up with the pt.

## 2021-03-19 NOTE — Progress Notes (Signed)
[place] [phone]   Fax:([phone] [place] NOTE  Patient Care Team: Charlott Rakes, MD as PCP - General (Family Medicine)  Hematological/Oncological History 1) 04/21/2016: Labs showed WBC 12.0 (H), RBC 6.09 (H), Hgb 17.3 (H), Plt 132K 2) 04/19/2020: Labs showed WBC 9.5, RBC 6.29 (H), Hgb 17.8 (H), Plt 111K 3) 12/06/2020: Labs showed WBC 9.1, RBC 6.76 (H), Hgb 18.9 (H), Plt 120K 4) 12/17/2020: Established care with Dede Query PA-C   CHIEF COMPLAINTS: Secondary polycythemia  HISTORY OF PRESENTING ILLNESS:  Bradley [name] 40 y.o. male who presents to the clinic for secondary polycythemia. Patient is unaccompanied for this visit. He reports that his energy levels and appetite are stable. He has episodes of fatigue but continues to complete his ADLs on his own. He denies any nausea, vomiting or abdominal pain. He denies any changes to his bowel movements. He denies easy bruising or signs of bleeding. He continues to have lower extremity pain with swelling and peripheral neuropathy. He reports not wearing compression stockings regularly. He has chronic SOB with exertion secondary to COPD. He denies any fevers, chills, night sweats, chest pain, cough, headaches, bleeding or easy bruising. He has no other complaints. Rest of 10 point ROS is below.   MEDICAL HISTORY:  Past Medical History:  Diagnosis Date   ADHD    ADHD (attention deficit hyperactivity disorder)    Bipolar 1 disorder (HCC)    Bipolar disorder ([place])    Diabetes mellitus without complication ([place])    Hyperlipidemia    Morbid obesity (HCC)    Obesity    Panic attack    Varicose veins of both lower extremities with pain     SURGICAL HISTORY: Past Surgical History:  Procedure Laterality Date   surgery on meatus as a child      SOCIAL HISTORY: Social History   Socioeconomic History   Marital status: Single    Spouse name: Not on file   Number of children: Not on file    Years of education: Not on file   Highest education level: Not on file  Occupational History   Not on file  Tobacco Use   Smoking status: Every Day    Packs/day: 1.00    Pack years: 0.00    Types: Cigarettes   Smokeless tobacco: Never  Vaping Use   Vaping Use: Never used  Substance and Sexual Activity   Alcohol use: Yes    Comment: Rarely   Drug use: No   Sexual activity: Not on file  Other Topics Concern   Not on file  Social History Narrative   ** Merged History Encounter **       Social Determinants of Health   Financial Resource Strain: Not on file  Food Insecurity: Not on file  Transportation Needs: Not on file  Physical Activity: Not on file  Stress: Not on file  Social Connections: Not on file  Intimate Partner Violence: Not on file    FAMILY HISTORY: Family History  Problem Relation Age of Onset   Rheum arthritis Mother    Diabetes Mother    Heart failure Mother    Stroke Mother    Glaucoma Maternal Grandmother    Lung cancer Paternal Grandmother        smoker    ALLERGIES:  is allergic to actos [pioglitazone], gabapentin, and lyrica [pregabalin].  MEDICATIONS:  Current Outpatient Medications  Medication Sig Dispense Refill   albuterol (VENTOLIN HFA) 108 (90 Base) MCG/ACT inhaler INHALE  1-2 PUFFS INTO THE LUNGS EVERY 6 (SIX) HOURS AS NEEDED FOR WHEEZING OR SHORTNESS OF BREATH. 18 g 2   ARIPiprazole (ABILIFY) 5 MG tablet TAKE 1 TABLET(S) BY MOUTH AT BEDTIME FOR MOOD 30 tablet 1   ARIPiprazole (ABILIFY) 5 MG tablet TAKE 1 TABLET(S) BY MOUTH AT BEDTIME FOR MOOD 30 tablet 1   ARIPiprazole (ABILIFY) 5 MG tablet TAKE 1 TABLET(S) BY MOUTH AT BEDTIME FOR MOOD 30 tablet 0   ARIPiprazole (ABILIFY) 5 MG tablet TAKE 1 TABLET(S) BY MOUTH AT BEDTIME FOR MOOD 30 tablet 1   ARIPiprazole (ABILIFY) 5 MG tablet Take 1 tablet(s) by mouth at bedtime for mood 30 tablet 1   ARIPiprazole (ABILIFY) 5 MG tablet Take 1 tablet(s) by mouth at bedtime for mood 30 tablet 2    atorvastatin (LIPITOR) 20 MG tablet TAKE 1 TABLET (20 MG TOTAL) BY MOUTH DAILY. 30 tablet 3   Blood Glucose Monitoring Suppl (ONETOUCH VERIO) w/Device KIT Use to check blood sugar up to 3 times daily (E11.69, Z79.4) 1 kit 0   busPIRone (BUSPAR) 7.5 MG tablet Take 7.5 mg by mouth 2 (two) times daily.     busPIRone (BUSPAR) 7.5 MG tablet TAKE 1 TABLET(S) BY MOUTH TWICE A DAY FOR SEVERE ANXIETY/PANIC ATTACKS 60 tablet 1   busPIRone (BUSPAR) 7.5 MG tablet TAKE 1 TABLET(S) BY MOUTH TWICE A DAY FOR SEVERE ANXIETY/PANIC ATTACKS 60 tablet 0   busPIRone (BUSPAR) 7.5 MG tablet Take 1 tablet(s) by mouth twice a day for severe anxiety/panic attacks 60 tablet 1   busPIRone (BUSPAR) 7.5 MG tablet Take 1 tablet(s) by mouth twice a day for severe anxiety/panic attacks 60 tablet 2   doxepin (SINEQUAN) 25 MG capsule Take 25 mg by mouth at bedtime.     doxepin (SINEQUAN) 25 MG capsule TAKE 1 CAPSULE(S) BY MOUTH AT BEDTIME AS NEEDED FOR SLEEP 30 capsule 0   doxepin (SINEQUAN) 50 MG capsule Take 50 mg by mouth at bedtime as needed.     doxepin (SINEQUAN) 50 MG capsule TAKE 1 CAPSULE(S) BY MOUTH AT BEDTIME AS NEEDED FOR SLEEP 30 capsule 1   doxepin (SINEQUAN) 50 MG capsule TAKE 1 CAPSULE(S) BY MOUTH AT BEDTIME AS NEEDED FOR SLEEP 30 capsule 1   doxepin (SINEQUAN) 50 MG capsule Take 1 capsule(s) by mouth at bedtime as needed for sleep 30 capsule 1   doxepin (SINEQUAN) 50 MG capsule Take 1 capsule(s) by mouth at bedtime as needed for sleep 30 capsule 2   Dulaglutide 3 MG/0.5ML SOPN INJECT 3 MG AS DIRECTED ONCE A WEEK. (Patient not taking: Reported on 02/21/2021) 6 mL 3   DULoxetine (CYMBALTA) 30 MG capsule Take 30 mg by mouth daily.     DULoxetine (CYMBALTA) 30 MG capsule TAKE 1 CAPSULE(S) BY MOUTH DAILY FOR ANXIETY/DEPRESSION 30 capsule 1   DULoxetine (CYMBALTA) 30 MG capsule TAKE 1 CAPSULE(S) BY MOUTH DAILY FOR ANXIETY/DEPRESSION 30 capsule 1   DULoxetine (CYMBALTA) 30 MG capsule TAKE 1 CAPSULE(S) BY MOUTH DAILY FOR  ANXIETY/DEPRESSION 30 capsule 0   DULoxetine (CYMBALTA) 30 MG capsule Take 1 capsule(s) by mouth daily for anxiety/depression 30 capsule 2   DULoxetine (CYMBALTA) 60 MG capsule Take 90 mg by mouth daily.     DULoxetine (CYMBALTA) 60 MG capsule TAKE 1 CAPSULE(S) BY MOUTH AT BEDTIME FOR ANXIETY/DEPRESSION 30 capsule 1   DULoxetine (CYMBALTA) 60 MG capsule TAKE 1 CAPSULE(S) BY MOUTH AT BEDTIME FOR ANXIETY/DEPRESSION 30 capsule 1   DULoxetine (CYMBALTA) 60 MG capsule TAKE 1 CAPSULE(S) BY MOUTH  AT BEDTIME FOR ANXIETY/DEPRESSION 30 capsule 0   DULoxetine (CYMBALTA) 60 MG capsule Take 1 capsule(s) by mouth at bedtime for anxiety/depression 30 capsule 2   glipiZIDE (GLUCOTROL) 10 MG tablet TAKE 2 TABLETS (20 MG TOTAL) BY MOUTH 2 (TWO) TIMES DAILY BEFORE A MEAL. (Patient not taking: Reported on 02/21/2021) 120 tablet 3   glucose blood (ONETOUCH VERIO) test strip Use to check blood sugar up to 3 times daily (E11.69, Z79.4) 100 each 11   insulin degludec (TRESIBA FLEXTOUCH) 100 UNIT/ML FlexTouch Pen Inject 26 Units into the skin daily. 15 mL 6   Insulin Pen Needle 31G X 5 MM MISC use as directed daily 100 each 3   Menthol 7.5 % PTCH Apply 1 patch topically as needed (pain).     OneTouch Delica Lancets 54O MISC Use to check blood sugar up to 3 times daily (E11.69, Z79.4) 100 each 11   pioglitazone (ACTOS) 15 MG tablet TAKE 1 TABLET (15 MG TOTAL) BY MOUTH DAILY. (Patient not taking: Reported on 02/21/2021) 90 tablet 1   sildenafil (VIAGRA) 50 MG tablet Take 1 tablet (50 mg total) by mouth daily as needed for erectile dysfunction. At least 24 hours between doses 10 tablet 1   traMADol (ULTRAM) 50 MG tablet TAKE 1 TABLET (50 MG TOTAL) BY MOUTH EVERY 12 (TWELVE) HOURS AS NEEDED (FOR PAIN). 60 tablet 2   warfarin (COUMADIN) 2.5 MG tablet TAKE BY MOUTH DAILY AS DIRECTED BY THE COUMADIN CLINIC. 30 tablet 2   No current facility-administered medications for this visit.    REVIEW OF SYSTEMS:   Constitutional: ( - )  fevers, ( - )  chills , ( - ) night sweats Eyes: ( - ) blurriness of vision, ( - ) double vision, ( - ) watery eyes Ears, nose, mouth, throat, and face: ( - ) mucositis, ( - ) sore throat Respiratory: ( - ) cough, ( + ) dyspnea, ( - ) wheezes Cardiovascular: ( - ) palpitation, ( - ) chest discomfort, ( + ) lower extremity swelling Gastrointestinal:  ( - ) nausea, ( - ) heartburn, ( - ) change in bowel habits Skin: ( - ) abnormal skin rashes Lymphatics: ( - ) new lymphadenopathy, ( - ) easy bruising Neurological: ( + ) numbness, ( - ) tingling, ( - ) new weaknesses Behavioral/Psych: ( - ) mood change, ( - ) new changes  All other systems were reviewed with the patient and are negative.  PHYSICAL EXAMINATION: ECOG PERFORMANCE STATUS: 1 - Symptomatic but completely ambulatory  Vitals:   03/19/21 1241  BP: 118/71  Pulse: (!) 115  Resp: 20  Temp: (!) 97 F (36.1 C)  SpO2: 100%   Filed Weights   03/19/21 1241  Weight: 291 lb 9.6 oz (132.3 kg)    GENERAL: well appearing male in NAD  SKIN: texture, turgor are normal, no rashes or significant lesions. Venous stasis dermatitis in lower extremities EYES: conjunctiva are pink and non-injected, sclera clear  OROPHARYNX: no exudate, no erythema; lips, buccal mucosa, and tongue normal. Severe teeth decay. NECK: supple, non-tender LYMPH:  no palpable lymphadenopathy in the cervical, axillary or supraclavicular lymph nodes.  LUNGS: Bilateral, diffuse wheezing upon auscultation HEART: Tachycardic, regular rhythm and no murmurs. Lower extremity edema (L > R) ABDOMEN: soft, non-tender, non-distended, normal bowel sounds Musculoskeletal: no cyanosis of digits and no clubbing  PSYCH: alert & oriented x 3, fluent speech NEURO: no focal motor/sensory deficits  LABORATORY DATA:  I have reviewed the data as  listed CBC Latest Ref Rng & Units 03/19/2021 12/17/2020 12/06/2020  WBC 4.0 - 10.5 K/uL 9.3 9.3 9.1  Hemoglobin 13.0 - 17.0 g/dL 17.5(H) 16.7  18.9(H)  Hematocrit 39.0 - 52.0 % 50.7 49.8 57.0(H)  Platelets 150 - 400 K/uL 102(L) 110(L) 120(L)    CMP Latest Ref Rng & Units 12/06/2020 04/19/2020 04/14/2019  Glucose 65 - 99 mg/dL 398(H) 445(H) 374(H)  BUN 6 - 24 mg/dL 7 <5(L) 6  Creatinine 0.76 - 1.27 mg/dL 0.78 0.81 0.78  Sodium 134 - 144 mmol/L 140 133(L) 134(L)  Potassium 3.5 - 5.2 mmol/L 4.7 4.4 4.2  Chloride 96 - 106 mmol/L 97 94(L) 101  CO2 22 - 32 mmol/L - 28 24  Calcium 8.7 - 10.2 mg/dL 9.7 10.0 8.5(L)  Total Protein 6.0 - 8.5 g/dL 7.6 7.6 6.3(L)  Total Bilirubin 0.0 - 1.2 mg/dL 0.5 0.7 0.6  Alkaline Phos 44 - 121 IU/L 110 124 94  AST 0 - 40 IU/L 31 29 33  ALT 0 - 44 U/L - 46(H) 43    ASSESSMENT & PLAN JULES BATY is a 40 y.o. male presenting to the clinic for evaluation for secondary polycythemia  #Polycythemia 2/2 smoking --MPN panel and BCR/ABL was negative.Likely etiology is smoking.  --Labs today reveal Hgb of 17.5.  --Recommend smoking cessation.  --RTC in 6 months with labs.   #Chronic smoking --Patient currently smokes 1 pack/day. He reports that he plans to completely quit in next month with a friend.  --Sent referral to tobacco cessation program at last visit but patient has not enrolled in program. I will send another referral.   #History of DVT and PE: --05/22/2016: DVT of left lower extremity involving common femoral, femoral,popliteal and saphenofemoral veins. Treated with xarelto for one year.  --11/27/2018: Bilateral PE. Patient was treated prior with doxycycline for pneumonia. Treated PE with lovenox >> coumadin.  --His family history is remarkable for his mother who had a DVT in her 61s.  --Risk factors include venous insufficiency, smoking, and sedentary lifestyle. --Patient will remain on lifelong anticoagulation due to risk factors mentioned above, multiple DVT/PE episodes, and family history of DVT.   #Thrombocytopenia:  --Likely etiology is possible hepatic cirrhosis and splenomegaly as  mentioned on CT scan from 11/27/2018.  --Patient denies any bleeding or easy bruising --Today's platelet count has mildly decreased to 102K. Monitor for now. --If levels worsen, consider repeat imaging to further evaluate.   #Hyperglycemia: --Today's glucose level is 481, chronically elevated. Patient has T2DM currently on insulin, glipizide, trulicity.  --Results came back after patient left clinic. Tried to call patient but unable to reach him, left voicemail.  --Will inform PCP, Dr. Margarita Rana about results and request to follow up with the patient.    Orders Placed This Encounter  Procedures   Ambulatory referral to Smoking Cessation Program    Referral Priority:   Routine    Referral Type:   Consultation    Referral Reason:   Specialty Services Required    Number of Visits Requested:   1    All questions were answered. The patient knows to call the clinic with any problems, questions or concerns.  I have spent a total of 25 minutes minutes of face-to-face and non-face-to-face time, preparing to see the patient, obtaining and/or reviewing separately obtained history, performing a medically appropriate examination, counseling and educating the patient, ordering tests, referring and communicating with other health care professionals, documenting clinical information in the electronic health record, and care coordination.  Dede Query, PA-C Department of Hematology/Oncology [place] at [facility] Phone: [phone]

## 2021-03-20 ENCOUNTER — Telehealth: Payer: Self-pay | Admitting: Family Medicine

## 2021-03-20 NOTE — Telephone Encounter (Signed)
Copied from CRM [id number]. Topic: General - Other >> Mar 19, 2021  1:35 PM Marylen Ponto wrote: Reason for CRM: Ansyi with [facility]  called to report patient glucose reading was 481 today. Cb# [phone] switch board can transfer to Jolayne Panther

## 2021-03-20 NOTE — Telephone Encounter (Signed)
FYI

## 2021-03-20 NOTE — Telephone Encounter (Signed)
Diabetes is managed by Endocrine Dr Lonzo Cloud. Please advise him to reach out to Endocrinologist.

## 2021-03-26 ENCOUNTER — Other Ambulatory Visit: Payer: Self-pay

## 2021-03-26 ENCOUNTER — Other Ambulatory Visit: Payer: Self-pay | Admitting: Family Medicine

## 2021-03-27 ENCOUNTER — Other Ambulatory Visit: Payer: Self-pay

## 2021-03-27 MED ORDER — WARFARIN SODIUM 2.5 MG PO TABS
ORAL_TABLET | ORAL | 2 refills | Status: DC
  Filled 2021-03-27: qty 30, 30d supply, fill #0
  Filled 2021-04-21: qty 30, 30d supply, fill #1
  Filled 2021-05-23: qty 30, 30d supply, fill #2

## 2021-03-27 NOTE — Telephone Encounter (Signed)
Requested medication (s) are due for refill today: yes   Requested medication (s) are on the active medication list:  yes   Last refill:  02/22/2021  Future visit scheduled: yes   Notes to clinic:  this refill cannot be delegated    Requested Prescriptions  Pending Prescriptions Disp Refills   warfarin (COUMADIN) 2.5 MG tablet 30 tablet 2    Sig: TAKE BY MOUTH DAILY AS DIRECTED BY THE COUMADIN CLINIC.      Hematology:  Anticoagulants - warfarin Failed - 03/26/2021  4:30 PM      Failed - This refill cannot be delegated      Failed - If the patient is managed by Coumadin Clinic - route to their Pool. If not, forward to the provider.      Failed - Valid encounter within last 3 months    Recent Outpatient Visits           4 months ago Type 2 diabetes mellitus with hyperglycemia, with long-term current use of insulin (HCC)   [facility] Hamer, [place], MD   7 months ago Type 2 diabetes mellitus with hyperglycemia, with long-term current use of insulin (HCC)   [facility] [place], [place], MD   10 months ago Type 2 diabetes mellitus with hyperglycemia, with long-term current use of insulin (HCC)   [facility] Artas, Odette Horns, MD   1 year ago Non-compliance   [facility] Rivereno, Odette Horns, MD   1 year ago Acute pulmonary embolism without acute cor pulmonale, unspecified pulmonary embolism type [facility])   [facility] [place], Cornelius Moras, RPH-CPP                Passed - INR in normal range and within 30 days    INR  Date Value Ref Range Status  03/18/2021 2.5 2.0 - 3.0 Final  04/19/2020 2.3 (H) 0.8 - 1.2 Final    Comment:    (NOTE) INR goal varies based on device and disease states. Performed at [facility], [address] [place], [place] [address]

## 2021-04-08 ENCOUNTER — Ambulatory Visit (INDEPENDENT_AMBULATORY_CARE_PROVIDER_SITE_OTHER): Payer: Medicare Other | Admitting: Podiatry

## 2021-04-08 ENCOUNTER — Encounter: Payer: Self-pay | Admitting: Podiatry

## 2021-04-08 ENCOUNTER — Other Ambulatory Visit: Payer: Self-pay | Admitting: Family Medicine

## 2021-04-08 ENCOUNTER — Other Ambulatory Visit: Payer: Self-pay

## 2021-04-08 DIAGNOSIS — L608 Other nail disorders: Secondary | ICD-10-CM

## 2021-04-08 DIAGNOSIS — E1142 Type 2 diabetes mellitus with diabetic polyneuropathy: Secondary | ICD-10-CM

## 2021-04-08 DIAGNOSIS — B351 Tinea unguium: Secondary | ICD-10-CM | POA: Diagnosis not present

## 2021-04-08 DIAGNOSIS — M79674 Pain in right toe(s): Secondary | ICD-10-CM | POA: Diagnosis not present

## 2021-04-08 DIAGNOSIS — M79675 Pain in left toe(s): Secondary | ICD-10-CM | POA: Diagnosis not present

## 2021-04-09 ENCOUNTER — Other Ambulatory Visit: Payer: Self-pay

## 2021-04-09 MED ORDER — ATORVASTATIN CALCIUM 20 MG PO TABS
ORAL_TABLET | Freq: Every day | ORAL | 3 refills | Status: DC
  Filled 2021-04-09: qty 30, 30d supply, fill #0
  Filled 2021-05-08: qty 30, 30d supply, fill #1
  Filled 2021-06-06: qty 30, 30d supply, fill #2
  Filled 2021-07-05: qty 30, 30d supply, fill #3

## 2021-04-10 ENCOUNTER — Other Ambulatory Visit: Payer: Self-pay

## 2021-04-11 ENCOUNTER — Other Ambulatory Visit: Payer: Self-pay

## 2021-04-11 NOTE — Progress Notes (Signed)
Subjective: Bradley [name] is a pleasant 40 y.o. male patient seen today painful thick toenails that are difficult to trim. Pain interferes with ambulation. Aggravating factors include wearing enclosed shoe gear. Pain is relieved with periodic professional debridement.  He states his blood glucose was 357 mg/dl today. He voices no new pedal problems on today's visit.  PCP is Hoy Register, MD. Last visit was: 11/26/2020.  Allergies  Allergen Reactions   Actos [Pioglitazone] Other (See Comments)    Stomach cramps   Gabapentin Other (See Comments)    Crying spells   Lyrica [Pregabalin] Other (See Comments)    Makes the patient somnolent    Objective: Physical Exam  General: Bradley [name] is a pleasant 40 y.o. Caucasian male, morbidly obese in NAD. AAO x 3.   Vascular:  Capillary refill time to digits immediate b/l. Palpable pedal pulses b/l LE. Pedal hair absent. Lower extremity skin temperature gradient within normal limits. Nonpitting edema noted b/l lower extremities. Varicosities present b/l.  Dermatological:  No open wounds b/l lower extremities No interdigital macerations b/l lower extremities Toenails 2-5 bilaterally elongated, discolored, dystrophic, thickened, and crumbly with subungual debris and tenderness to dorsal palpation. Pincer nail deformity L hallux and R hallux. No erythema, no edema, no drainage, no fluctuance. Nail border hypertrophy present. No erythema, no edema, no drainage.  Musculoskeletal:  Normal muscle strength 5/5 to all lower extremity muscle groups bilaterally. No pain crepitus or joint limitation noted with ROM b/l. No gross bony deformities bilaterally.  Neurological:  Pt has subjective symptoms of neuropathy. Protective sensation intact 5/5 intact bilaterally with 10g monofilament b/l. Vibratory sensation intact b/l. Proprioception intact bilaterally.  Assessment and Plan:  1. Pain due to onychomycosis of toenails of both feet   2. Pincer  nail deformity   3. Diabetic peripheral neuropathy associated with type 2 diabetes mellitus (HCC)     -Examined patient. -Continue diabetic foot care principles. -Patient to continue soft, supportive shoe gear daily. -Toenails 1-5 b/l were debrided in length and girth with sterile nail nippers and dremel without iatrogenic bleeding.  -Offending nail border debrided and curretaged L hallux and R hallux utilizing sterile nail nipper and currette. Border(s) cleansed with alcohol and triple antibiotic ointment applied. Patient instructed to apply Neosporin to L hallux and R hallux once daily for 7 days. -Patient to report any pedal injuries to medical professional immediately. -Patient/POA to call should there be question/concern in the interim.  Return in about 3 months (around 07/09/2021).  Freddie Breech, DPM

## 2021-04-15 ENCOUNTER — Other Ambulatory Visit: Payer: Self-pay

## 2021-04-15 ENCOUNTER — Encounter: Payer: Self-pay | Admitting: Internal Medicine

## 2021-04-15 ENCOUNTER — Ambulatory Visit (INDEPENDENT_AMBULATORY_CARE_PROVIDER_SITE_OTHER): Payer: Medicare Other | Admitting: Internal Medicine

## 2021-04-15 VITALS — BP 112/68 | HR 93 | Ht 74.0 in | Wt 289.0 lb

## 2021-04-15 DIAGNOSIS — Z794 Long term (current) use of insulin: Secondary | ICD-10-CM | POA: Diagnosis not present

## 2021-04-15 DIAGNOSIS — E1165 Type 2 diabetes mellitus with hyperglycemia: Secondary | ICD-10-CM

## 2021-04-15 LAB — POCT GLYCOSYLATED HEMOGLOBIN (HGB A1C): Hemoglobin A1C: 12.7 % — AB (ref 4.0–5.6)

## 2021-04-15 MED ORDER — TRESIBA FLEXTOUCH 100 UNIT/ML ~~LOC~~ SOPN
35.0000 [IU] | PEN_INJECTOR | Freq: Every day | SUBCUTANEOUS | 3 refills | Status: DC
  Filled 2021-04-15: qty 30, 85d supply, fill #0
  Filled 2021-07-31: qty 30, 85d supply, fill #1

## 2021-04-15 NOTE — Progress Notes (Signed)
Name: Bradley [name]  Age/ Sex: 40 y.o., male   MRN/ DOB: [id number], [birth year]     PCP: Charlott Rakes, MD   Reason for Endocrinology Evaluation: Type 2 Diabetes Mellitus  Initial Endocrine Consultative Visit: 10/18/2020    PATIENT IDENTIFIER: Bradley [name] is a 40 y.o. male with a past medical history of T2Dm, DVT and bipolar disorder. The patient has followed with Endocrinology clinic since 10/18/2020 for consultative assistance with management of his diabetes.  DIABETIC HISTORY:  Bradley [name] was diagnosed with DM yrs ago. Basaglar made him feel sluggish. His hemoglobin A1c has ranged from 7.7% in 2019, peaking at 14.9% in 2021.   On his initial visit to our clinic his A1c was 13.6 %, he DECLINED insulin. He was on Glipizide and Trulicity, we increased Glipizide, Trulicity and added Pioglitazone. By the time he went  To see our RD in 12/2020 he was off pioglitazone  Due to abdominal cramps    Stopped Glipizide and started basal insulin 12/2020  Lives with girlfriend    He is disabled due to mental health SUBJECTIVE:   During the last visit (01/09/2021): 11.3% We stopped glipizide, started basal insulin and continued Trulicity      Today (1/47/8295): Bradley [name] is here for a follow up on diabetes management.  He checks his blood sugars 1 times every other day.  The patient has not had hypoglycemic episodes since the last clinic visit.   Denies abdomina pain or nausea   Has not taken Trulicity in 6 weeks due to cost $7     HOME DIABETES REGIMEN:  Trulicity  3 mg weekly  Tresiba 26 units daily     Statin: no ACE-I/ARB: no Prior Diabetic Education: yes   METER DOWNLOAD SUMMARY: Did not bring    DIABETIC COMPLICATIONS: Microvascular complications:  neuropathy Denies: CKD retinopathy Last Eye Exam: Completed 08/2020  Macrovascular complications:   Denies: CAD, CVA, PVD   HISTORY:  Past Medical History:  Past Medical History:  Diagnosis  Date   ADHD    ADHD (attention deficit hyperactivity disorder)    Bipolar 1 disorder ([place])    Bipolar disorder ([place])    Diabetes mellitus without complication ([place])    Hyperlipidemia    Morbid obesity ([place])    Obesity    Panic attack    Varicose veins of both lower extremities with pain    Past Surgical History:  Past Surgical History:  Procedure Laterality Date   surgery on meatus as a child     Social History:  reports that he has been smoking cigarettes. He has been smoking an average of 1 pack per day. He has never used smokeless tobacco. He reports current alcohol use. He reports that he does not use drugs. Family History:  Family History  Problem Relation Age of Onset   Rheum arthritis Mother    Diabetes Mother    Heart failure Mother    Stroke Mother    Glaucoma Maternal Grandmother    Lung cancer Paternal Grandmother        smoker     HOME MEDICATIONS: Allergies as of 04/15/2021       Reactions   Actos [pioglitazone] Other (See Comments)   Stomach cramps   Gabapentin Other (See Comments)   Crying spells   Lyrica [pregabalin] Other (See Comments)   Makes the patient somnolent        Medication List        Accurate as of April 15, 2021  2:19 PM. If you have any questions, ask your nurse or doctor.          albuterol 108 (90 Base) MCG/ACT inhaler Commonly known as: VENTOLIN HFA INHALE 1-2 PUFFS INTO THE LUNGS EVERY 6 (SIX) HOURS AS NEEDED FOR WHEEZING OR SHORTNESS OF BREATH.   ARIPiprazole 5 MG tablet Commonly known as: ABILIFY TAKE 1 TABLET(S) BY MOUTH AT BEDTIME FOR MOOD   ARIPiprazole 5 MG tablet Commonly known as: ABILIFY TAKE 1 TABLET(S) BY MOUTH AT BEDTIME FOR MOOD   ARIPiprazole 5 MG tablet Commonly known as: ABILIFY TAKE 1 TABLET(S) BY MOUTH AT BEDTIME FOR MOOD   ARIPiprazole 5 MG tablet Commonly known as: ABILIFY TAKE 1 TABLET(S) BY MOUTH AT BEDTIME FOR MOOD   ARIPiprazole 5 MG tablet Commonly known as: ABILIFY Take 1 tablet(s)  by mouth at bedtime for mood   ARIPiprazole 5 MG tablet Commonly known as: ABILIFY Take 1 tablet(s) by mouth at bedtime for mood   atorvastatin 20 MG tablet Commonly known as: LIPITOR Take 1 tablet by mouth daily.   B-D UF III MINI PEN NEEDLES 31G X 5 MM Misc Generic drug: Insulin Pen Needle use as directed daily   busPIRone 7.5 MG tablet Commonly known as: BUSPAR Take 7.5 mg by mouth 2 (two) times daily.   busPIRone 7.5 MG tablet Commonly known as: BUSPAR TAKE 1 TABLET(S) BY MOUTH TWICE A DAY FOR SEVERE ANXIETY/PANIC ATTACKS   busPIRone 7.5 MG tablet Commonly known as: BUSPAR TAKE 1 TABLET(S) BY MOUTH TWICE A DAY FOR SEVERE ANXIETY/PANIC ATTACKS   busPIRone 7.5 MG tablet Commonly known as: BUSPAR Take 1 tablet(s) by mouth twice a day for severe anxiety/panic attacks   busPIRone 7.5 MG tablet Commonly known as: BUSPAR Take 1 tablet(s) by mouth twice a day for severe anxiety/panic attacks   doxepin 25 MG capsule Commonly known as: SINEQUAN Take 25 mg by mouth at bedtime.   doxepin 25 MG capsule Commonly known as: SINEQUAN TAKE 1 CAPSULE(S) BY MOUTH AT BEDTIME AS NEEDED FOR SLEEP   doxepin 50 MG capsule Commonly known as: SINEQUAN Take 50 mg by mouth at bedtime as needed.   doxepin 50 MG capsule Commonly known as: SINEQUAN TAKE 1 CAPSULE(S) BY MOUTH AT BEDTIME AS NEEDED FOR SLEEP   doxepin 50 MG capsule Commonly known as: SINEQUAN TAKE 1 CAPSULE(S) BY MOUTH AT BEDTIME AS NEEDED FOR SLEEP   doxepin 50 MG capsule Commonly known as: SINEQUAN Take 1 capsule(s) by mouth at bedtime as needed for sleep   doxepin 50 MG capsule Commonly known as: SINEQUAN Take 1 capsule(s) by mouth at bedtime as needed for sleep   DULoxetine 30 MG capsule Commonly known as: CYMBALTA Take 30 mg by mouth daily. What changed: Another medication with the same name was removed. Continue taking this medication, and follow the directions you see here. Changed by: Dorita Sciara,  MD   DULoxetine 30 MG capsule Commonly known as: CYMBALTA TAKE 1 CAPSULE(S) BY MOUTH DAILY FOR ANXIETY/DEPRESSION What changed: Another medication with the same name was removed. Continue taking this medication, and follow the directions you see here. Changed by: Dorita Sciara, MD   DULoxetine 30 MG capsule Commonly known as: CYMBALTA TAKE 1 CAPSULE(S) BY MOUTH DAILY FOR ANXIETY/DEPRESSION What changed: Another medication with the same name was removed. Continue taking this medication, and follow the directions you see here. Changed by: Dorita Sciara, MD   DULoxetine 60 MG capsule Commonly known as: CYMBALTA Take 1 capsule(s)  by mouth at bedtime for anxiety/depression What changed: Another medication with the same name was removed. Continue taking this medication, and follow the directions you see here. Changed by: Dorita Sciara, MD   glipiZIDE 10 MG tablet Commonly known as: GLUCOTROL TAKE 2 TABLETS (20 MG TOTAL) BY MOUTH 2 (TWO) TIMES DAILY BEFORE A MEAL.   glucose blood test strip Commonly known as: OneTouch Verio Use to check blood sugar up to 3 times daily (E11.69, Z79.4)   Menthol 7.5 % Ptch Apply 1 patch topically as needed (pain).   OneTouch Delica Lancets 75Z Misc Use to check blood sugar up to 3 times daily (E11.69, Z79.4)   OneTouch Verio w/Device Kit Use to check blood sugar up to 3 times daily (E11.69, Z79.4)   pioglitazone 15 MG tablet Commonly known as: ACTOS TAKE 1 TABLET (15 MG TOTAL) BY MOUTH DAILY.   sildenafil 50 MG tablet Commonly known as: Viagra Take 1 tablet (50 mg total) by mouth daily as needed for erectile dysfunction. At least 24 hours between doses   traMADol 50 MG tablet Commonly known as: ULTRAM TAKE 1 TABLET (50 MG TOTAL) BY MOUTH EVERY 12 (TWELVE) HOURS AS NEEDED (FOR PAIN).   Tyler Aas FlexTouch 100 UNIT/ML FlexTouch Pen Generic drug: insulin degludec Inject 26 Units into the skin daily.   Trulicity 3 VJ/2.8AS  Sopn Generic drug: Dulaglutide INJECT 3 MG AS DIRECTED ONCE A WEEK.   warfarin 2.5 MG tablet Commonly known as: COUMADIN Take as directed by the anticoagulation clinic. If you are unsure how to take this medication, talk to your nurse or doctor. Original instructions: TAKE BY MOUTH DAILY AS DIRECTED BY THE COUMADIN CLINIC.         OBJECTIVE:   Vital Signs: BP 112/68   Pulse 93   Ht $R'6\' 2"'xG$  (1.88 m)   Wt 289 lb (131.1 kg)   SpO2 98%   BMI 37.11 kg/m   Wt Readings from Last 3 Encounters:  04/15/21 289 lb (131.1 kg)  03/19/21 291 lb 9.6 oz (132.3 kg)  01/09/21 (!) 301 lb 8 oz (136.8 kg)     Exam: General: Pt appears well and is in NAD  Lungs: Clear with good BS bilat with no rales, rhonchi, or wheezes  Heart: RRR with normal S1 and S2 and no gallops; no murmurs; no rub  Abdomen: Normoactive bowel sounds, soft, nontender, without masses or organomegaly palpable  Extremities: No pretibial edema.  Stasis dermatitis over the shins  Neuro: MS is good with appropriate affect, pt is alert and Ox3     DM foot exam: 10/18/2020   The skin of the feet is intact without sores or ulcerations. The pedal pulses are 2+ on right and 2+ on left. The sensation is decrease to a screening 5.07, 10 gram monofilament bilaterally    DATA REVIEWED:  Lab Results  Component Value Date   HGBA1C 11.3 (A) 01/09/2021   HGBA1C 13.6 (A) 08/09/2020   HGBA1C 13.2 (A) 05/03/2020   Lab Results  Component Value Date   MICROALBUR 0.5 06/19/2016   LDLCALC 98 12/06/2020   CREATININE 0.99 03/19/2021   Lab Results  Component Value Date   MICRALBCREAT 20 12/06/2020     Lab Results  Component Value Date   CHOL 185 12/06/2020   HDL 47 12/06/2020   LDLCALC 98 12/06/2020   TRIG 236 (H) 12/06/2020   CHOLHDL 3.9 12/06/2020         ASSESSMENT / PLAN / RECOMMENDATIONS:   1) Type  2 Diabetes Mellitus, Poorly controlled, With Neuropathic  complications - Most recent A1c of 12.7 %. Goal A1c < 7.0 %.    - Pt continues with hyperglycemia, he has not had trulicity in ~ 6 weeks due to financial strains. I also suspect dietary indiscretions  -He is intolerant to pioglitazone due to abdominal pain - He was provided with pt assistance program application  - Trulicity samples provided as well     MEDICATIONS:  Continue Trulicity 3 mg weekly  Increase Tresiba to 35 units once daily   EDUCATION / INSTRUCTIONS: BG monitoring instructions: Patient is instructed to check his blood sugars 1 times a day, fasting. Call [facility] if: BG persistently < 70  I reviewed the Rule of 15 for the treatment of hypoglycemia in detail with the patient. Literature supplied.    2) Diabetic complications:  Eye: Does not have known diabetic retinopathy.  Neuro/ Feet: Does  have known diabetic peripheral neuropathy .  Renal: Patient does not have known baseline CKD. He   is not on an ACEI/ARB at present.      3) Hypertriglyceridemia : We had discussed ADA recommendations in statin use between ages 60-75 . We had discussed cardiovascular benefits of statins.  We will consider initiating statin therapy in the future.  4) Tobacco dependence:  - I have counseled him about tobacco cessation, we discussed increase risk of lung, bladder, colon and pancreatic cancer as well chronic bronchitis. We also discussed from the financial stand point, it will save him money and encouraged him to prioritize spending money on things that would improve his health ( such as $7 for a month of Trulicity vs $ 5.5 a pack of cigarettes multiple times a week )    F/U in 3 months  I spent 25 minutes preparing to see the patient by review of recent labs, imaging and procedures, obtaining and reviewing separately obtained history, communicating with the patient/family or caregiver, ordering medications, tests or procedures, and documenting clinical information in the EHR including the differential Dx, treatment, and  any further evaluation and other management   Signed electronically by: Mack Guise, MD  [facility] Endocrinology  [facility] [address], [place], [place] [address] Phone: [phone] FAX: [phone]   CC: Charlott Rakes, MD [place] [address] Phone: [phone]  Fax: [phone]  Return to Endocrinology clinic as below: Future Appointments  Date Time Provider [place]  04/15/2021  2:20 PM Tahir Blank, Melanie Crazier, MD [facility] None  04/17/2021  3:30 PM Tresa Endo, RPH-CPP [facility] None  05/09/2021  4:15 PM Valora Piccolo Barnabas Lister, RD [place] [facility]DM  05/21/2021  1:30 PM Bernarda Caffey, MD [facility] None  07/15/2021  2:00 PM Marzetta Board, DPM [facility]  09/17/2021  2:30 PM [facility] None  09/17/2021  3:00 PM Lincoln Brigham, PA-C [facility] None

## 2021-04-15 NOTE — Patient Instructions (Signed)
-   Continue Trulicity 3 mg weekly  - Increase Tresiba to 35 units once daily      HOW TO TREAT LOW BLOOD SUGARS (Blood sugar LESS THAN 70 MG/DL) Please follow the RULE OF 15 for the treatment of hypoglycemia treatment (when your (blood sugars are less than 70 mg/dL)   STEP 1: Take 15 grams of carbohydrates when your blood sugar is low, which includes:  3-4 GLUCOSE TABS  OR 3-4 OZ OF JUICE OR REGULAR SODA OR ONE TUBE OF GLUCOSE GEL    STEP 2: RECHECK blood sugar in 15 MINUTES STEP 3: If your blood sugar is still low at the 15 minute recheck --> then, go back to STEP 1 and treat AGAIN with another 15 grams of carbohydrates.

## 2021-04-16 ENCOUNTER — Other Ambulatory Visit: Payer: Self-pay

## 2021-04-17 ENCOUNTER — Other Ambulatory Visit: Payer: Self-pay

## 2021-04-17 ENCOUNTER — Ambulatory Visit: Payer: Medicare Other | Attending: Family Medicine | Admitting: Pharmacist

## 2021-04-17 DIAGNOSIS — I2699 Other pulmonary embolism without acute cor pulmonale: Secondary | ICD-10-CM | POA: Diagnosis not present

## 2021-04-17 DIAGNOSIS — I825Y2 Chronic embolism and thrombosis of unspecified deep veins of left proximal lower extremity: Secondary | ICD-10-CM

## 2021-04-17 LAB — POCT INR: INR: 2.4 (ref 2.0–3.0)

## 2021-04-22 ENCOUNTER — Other Ambulatory Visit: Payer: Self-pay

## 2021-04-23 ENCOUNTER — Other Ambulatory Visit: Payer: Self-pay

## 2021-04-24 ENCOUNTER — Other Ambulatory Visit: Payer: Self-pay

## 2021-04-25 ENCOUNTER — Other Ambulatory Visit: Payer: Self-pay

## 2021-04-26 ENCOUNTER — Other Ambulatory Visit: Payer: Self-pay

## 2021-05-08 ENCOUNTER — Other Ambulatory Visit: Payer: Self-pay | Admitting: Family Medicine

## 2021-05-08 ENCOUNTER — Other Ambulatory Visit: Payer: Self-pay

## 2021-05-08 DIAGNOSIS — E1149 Type 2 diabetes mellitus with other diabetic neurological complication: Secondary | ICD-10-CM

## 2021-05-08 NOTE — Telephone Encounter (Signed)
Requested medication (s) are due for refill today: due 8 /18 /22  Requested medication (s) are on the active medication list: yes  Last refill: 02/13/21  #60  2 refills  Future visit scheduled    yes with pharmacist  05/16/21  Notes to clinic  not delegated  Requested Prescriptions  Pending Prescriptions Disp Refills   traMADol (ULTRAM) 50 MG tablet 60 tablet 2    Sig: TAKE 1 TABLET (50 MG TOTAL) BY MOUTH EVERY 12 (TWELVE) HOURS AS NEEDED (FOR PAIN).      Not Delegated - Analgesics:  Opioid Agonists Failed - 05/08/2021  4:12 PM      Failed - This refill cannot be delegated      Failed - Urine Drug Screen completed in last 360 days      Passed - Valid encounter within last 6 months    Recent Outpatient Visits           5 months ago Type 2 diabetes mellitus with hyperglycemia, with long-term current use of insulin (HCC)   [facility] [place], [place], MD   9 months ago Type 2 diabetes mellitus with hyperglycemia, with long-term current use of insulin (HCC)   [facility] [place], [place], MD   1 year ago Type 2 diabetes mellitus with hyperglycemia, with long-term current use of insulin (HCC)   [facility] Hoy Register, MD   1 year ago Non-compliance   [facility] [place], Odette Horns, MD   1 year ago Acute pulmonary embolism without acute cor pulmonale, unspecified pulmonary embolism type [facility])   [facility] [place], Cornelius Moras, RPH-CPP

## 2021-05-09 ENCOUNTER — Encounter: Payer: Medicare Other | Attending: Internal Medicine | Admitting: Dietician

## 2021-05-09 ENCOUNTER — Encounter: Payer: Self-pay | Admitting: Dietician

## 2021-05-09 ENCOUNTER — Other Ambulatory Visit: Payer: Self-pay

## 2021-05-09 DIAGNOSIS — Z794 Long term (current) use of insulin: Secondary | ICD-10-CM | POA: Diagnosis not present

## 2021-05-09 DIAGNOSIS — E1165 Type 2 diabetes mellitus with hyperglycemia: Secondary | ICD-10-CM | POA: Insufficient documentation

## 2021-05-09 NOTE — Patient Instructions (Signed)
Continue to take your medication consistently.  Great job with this! Consider checking your blood sugar daily.  Be mindful of your beverage choices. Aim to be more active.  This will help your blood sugar and help prevent complications including keeping your neuropathy from worsening.

## 2021-05-09 NOTE — Progress Notes (Signed)
Diabetes Self-Management Education  Visit Type: Follow-up  Appt. Start Time: 1615 Appt. End Time: 1650  05/09/2021  Bradley [name], identified by name and date of birth, is a 40 y.o. male with a diagnosis of Diabetes:  .   ASSESSMENT Patient is here today with his girlfriend.  He was last seen by this RD 02/21/2021.  Currently drinking a regular Coke.  Patient states that he is down to 1 per week.  Reminded patient that this is good for increasing his blood sugar. He is drinking a diet flavored water most of the time. Off Trulicity for a time due to not being able to afford it.  He has now resumed this. Actos stopped due to stomach cramps. He is not taking Glipizide at this time as this was discontinued. He is taking Tresiba 35 units  every HS.  He has not problems affording this but forgets once per month. His girlfriend has been trying to convince him to go to the gym but he complains of increased pain due to neuropathy. He continues to smoke.  He is not checking his blood sugar at home. He states that his blood glucose meter is slow and not working as well.  New meter provided and patient was able to set it up and link it to his phone app during this appointment.  Blood glucose was 458 after 2 sandwiches 1 hour ago and 10 oz of regular coke in my office.  Encouraged him to begin checking his blood sugar regularly.  Discussed the effects of the coke on his blood glucose and food equivalent.  One Touch Verio Reflect Lot:  [id number] Expiration:  12/27/2021  History includes Type 2 Diabetes (2019), DVT, Bipolar, ADHD, neuropathy, smoking, memory issues Labs noted to include:  A1C 13.6% 08/09/2020 increased from 13.2% 05/03/2020, cholesterol 193, Triglycerides 365, HDL 40, LDL 92 08/09/2020 Medications include:  Trulicity (his girlfriend reminds him), glipizide (discontinued), actos (discontinued), Tresiba 26 units q HS, coumadin, Abilify and other medications that contribute to weight  gain. He stopped pioglitizone after 3 days due to stomach cramps. He could not tolerated Metformin due to bowel incontinence.    Sleep:  Eats late (10pm - midnight).  Goes to sleep late.  Wakes about 10:30 am. Support:  [facility] [employer] [facility], patient of Dr. Lonzo Cloud at [facility], PCP Hoy Register, MD   Weight: 295 lbs 02/14/21 306 lbs 47/2022 306 lbs 11/23/2020 425 lbs highest adult weight 2 years ago (loss presumably due to uncontrolled blood sugar) Lowest adult weight 235 lbs about 8 years ago and he does not recall circumstances of increased weight gain.   Patient lives with his girlfriend.  He does the shopping and cooking. He is on disability.  Finances are a concern. His mother passed away 17-Nov-2020 from complications of diabetes. He is planning on quitting smoking and has a support system of people that are willing to quit with him. Girlfriend  goes to the gym 2-3 times per week [employer]).  There were no vitals taken for this visit. There is no height or weight on file to calculate BMI.   Diabetes Self-Management Education - 05/09/21 1700       Visit Information   Visit Type Follow-up      Initial Visit   Are you taking your medications as prescribed? Yes      Pre-Education Assessment   Patient understands the diabetes disease and treatment process. Needs Review    Patient understands incorporating nutritional management  into lifestyle. Needs Review    Patient undertands incorporating physical activity into lifestyle. Needs Review    Patient understands using medications safely. Needs Review    Patient understands monitoring blood glucose, interpreting and using results Needs Review    Patient understands prevention, detection, and treatment of acute complications. Needs Review    Patient understands prevention, detection, and treatment of chronic complications. Needs Review    Patient understands how to develop strategies to  address psychosocial issues. Needs Review    Patient understands how to develop strategies to promote health/change behavior. Needs Review      Complications   Last HgB A1C per patient/outside source 12.7 %   04/15/21     Dietary Intake   Breakfast skips    Lunch 2 [place] and cheese sandwiches on [employer]    Dinner 3 oz steak and a spoon of potatoes    Beverage(s) flavored water, diet soda, occasional regular Coke      Exercise   Exercise Type ADL's      Patient Education   Previous Diabetes Education Yes (please comment)    Nutrition management  Other (comment)   beverage choices   Physical activity and exercise  Role of exercise on diabetes management, blood pressure control and cardiac health.;Helped patient identify appropriate exercises in relation to his/her diabetes, diabetes complications and other health issue.    Medications Reviewed patients medication for diabetes, action, purpose, timing of dose and side effects.    Monitoring Taught/evaluated SMBG meter.;Purpose and frequency of SMBG.    Psychosocial adjustment Worked with patient to identify barriers to care and solutions      Individualized Goals (developed by patient)   Nutrition General guidelines for healthy choices and portions discussed    Physical Activity Exercise 3-5 times per week;30 minutes per day    Medications take my medication as prescribed    Monitoring  test my blood glucose as discussed    Reducing Risk examine blood glucose patterns      Patient Self-Evaluation of Goals - Patient rates self as meeting previously set goals (% of time)   Nutrition 50 - 75 %    Physical Activity 25 - 50%    Medications >75%    Monitoring < 25%    Problem Solving 50 - 75 %    Reducing Risk 50 - 75 %    Health Coping >75%      Post-Education Assessment   Patient understands the diabetes disease and treatment process. Demonstrates understanding / competency    Patient understands incorporating nutritional management into  lifestyle. Needs Review    Patient undertands incorporating physical activity into lifestyle. Needs Review    Patient understands using medications safely. Needs Review    Patient understands monitoring blood glucose, interpreting and using results Demonstrates understanding / competency    Patient understands prevention, detection, and treatment of acute complications. Demonstrates understanding / competency    Patient understands prevention, detection, and treatment of chronic complications. Demonstrates understanding / competency    Patient understands how to develop strategies to address psychosocial issues. Needs Review    Patient understands how to develop strategies to promote health/change behavior. Needs Review      Outcomes   Expected Outcomes Demonstrated interest in learning. Expect positive outcomes    Future DMSE 3-4 months    Program Status Not Completed      Subsequent Visit   Since your last visit have you continued or begun to take your medications as prescribed?  Yes    Since your last visit have you experienced any weight changes? Loss    Weight Loss (lbs) 9    Since your last visit, are you checking your blood glucose at least once a day? No             Individualized Plan for Diabetes Self-Management Training:   Learning Objective:  Patient will have a greater understanding of diabetes self-management. Patient education plan is to attend individual and/or group sessions per assessed needs and concerns.   Plan:   Patient Instructions  Continue to take your medication consistently.  Great job with this! Consider checking your blood sugar daily.  Be mindful of your beverage choices. Aim to be more active.  This will help your blood sugar and help prevent complications including keeping your neuropathy from worsening.  Expected Outcomes:  Demonstrated interest in learning. Expect positive outcomes  Education material provided:   If problems or questions,  patient to contact team via:  Phone  Future DSME appointment: 3-4 months

## 2021-05-10 ENCOUNTER — Other Ambulatory Visit: Payer: Self-pay

## 2021-05-10 MED ORDER — TRAMADOL HCL 50 MG PO TABS
ORAL_TABLET | ORAL | 2 refills | Status: DC
Start: 2021-05-10 — End: 2021-08-05
  Filled 2021-05-10: qty 60, 30d supply, fill #0
  Filled 2021-06-06: qty 14, 7d supply, fill #1
  Filled 2021-06-13: qty 60, 30d supply, fill #2
  Filled 2021-07-10: qty 46, 23d supply, fill #3

## 2021-05-16 ENCOUNTER — Ambulatory Visit: Payer: Medicare Other | Admitting: Pharmacist

## 2021-05-20 ENCOUNTER — Ambulatory Visit: Payer: Medicare Other | Admitting: Pharmacist

## 2021-05-20 NOTE — Progress Notes (Signed)
[facility] Note  05/21/2021     CHIEF COMPLAINT Patient presents for Diabetic Eye Exam   HISTORY OF PRESENT ILLNESS: Bradley [name] is a 40 y.o. male who presents to the clinic today for:   HPI     Diabetic Eye Exam   Vision is stable and fluctuates with blood sugars.  Diabetes characteristics include Type 2 and on insulin.  This started 3 years ago.  Blood sugar level is uncontrolled.  Last Blood Glucose 450.  Last A1C 12.7.  Associated Diagnosis Neuropathy.  I, the attending physician,  performed the HPI with the patient and updated documentation appropriately.        Comments   9 month follow up DM 2 without retinopathy-  Doing well. No new problems or changes in vision. After A1C doctor started him on a new insulin, Tresiba.       Last edited by Bernarda Caffey, MD on 05/26/2021 12:17 AM.    pt states his blood sugar has been "really, really high" over the last 3 months, he states he is unsure why it's so high bc he was recently started on insulin and is following his drs directions on how to take it, pt states he also takes medication for cholesterol and warfarin for blood clots  Referring physician: Charlott Rakes, MD [place],  [place] [id number]  HISTORICAL INFORMATION:   Selected notes from the [place]: No current outpatient medications on file. (Ophthalmic Drugs)   No current facility-administered medications for this visit. (Ophthalmic Drugs)   Current Outpatient Medications (Other)  Medication Sig   albuterol (VENTOLIN HFA) 108 (90 Base) MCG/ACT inhaler INHALE 1-2 PUFFS INTO THE LUNGS EVERY 6 (SIX) HOURS AS NEEDED FOR WHEEZING OR SHORTNESS OF BREATH.   ARIPiprazole (ABILIFY) 5 MG tablet TAKE 1 TABLET(S) BY MOUTH AT BEDTIME FOR MOOD   atorvastatin (LIPITOR) 20 MG tablet Take 1 tablet by mouth daily.   busPIRone (BUSPAR) 7.5 MG tablet Take 7.5 mg by mouth 2 (two) times daily.    doxepin (SINEQUAN) 25 MG capsule Take 25 mg by mouth at bedtime.   DULoxetine (CYMBALTA) 30 MG capsule Take 30 mg by mouth daily.   insulin degludec (TRESIBA FLEXTOUCH) 100 UNIT/ML FlexTouch Pen Inject 35 Units into the skin daily.   sildenafil (VIAGRA) 50 MG tablet Take 1 tablet (50 mg total) by mouth daily as needed for erectile dysfunction. At least 24 hours between doses   traMADol (ULTRAM) 50 MG tablet TAKE 1 TABLET (50 MG TOTAL) BY MOUTH EVERY 12 (TWELVE) HOURS AS NEEDED (FOR PAIN).   warfarin (COUMADIN) 2.5 MG tablet TAKE BY MOUTH DAILY AS DIRECTED BY THE COUMADIN CLINIC.   ARIPiprazole (ABILIFY) 5 MG tablet TAKE 1 TABLET(S) BY MOUTH AT BEDTIME FOR MOOD   ARIPiprazole (ABILIFY) 5 MG tablet TAKE 1 TABLET(S) BY MOUTH AT BEDTIME FOR MOOD   ARIPiprazole (ABILIFY) 5 MG tablet TAKE 1 TABLET(S) BY MOUTH AT BEDTIME FOR MOOD   ARIPiprazole (ABILIFY) 5 MG tablet Take 1 tablet(s) by mouth at bedtime for mood   ARIPiprazole (ABILIFY) 5 MG tablet Take 1 tablet(s) by mouth at bedtime for mood   Blood Glucose Monitoring Suppl (ONETOUCH VERIO) w/Device KIT Use to check blood sugar up to 3 times daily (E11.69, Z79.4)   busPIRone (BUSPAR) 7.5 MG tablet TAKE 1 TABLET(S) BY MOUTH TWICE A DAY FOR SEVERE ANXIETY/PANIC ATTACKS   busPIRone (BUSPAR) 7.5 MG tablet TAKE 1  TABLET(S) BY MOUTH TWICE A DAY FOR SEVERE ANXIETY/PANIC ATTACKS   busPIRone (BUSPAR) 7.5 MG tablet Take 1 tablet(s) by mouth twice a day for severe anxiety/panic attacks   busPIRone (BUSPAR) 7.5 MG tablet Take 1 tablet(s) by mouth twice a day for severe anxiety/panic attacks   doxepin (SINEQUAN) 25 MG capsule TAKE 1 CAPSULE(S) BY MOUTH AT BEDTIME AS NEEDED FOR SLEEP   doxepin (SINEQUAN) 50 MG capsule Take 50 mg by mouth at bedtime as needed.   doxepin (SINEQUAN) 50 MG capsule TAKE 1 CAPSULE(S) BY MOUTH AT BEDTIME AS NEEDED FOR SLEEP   doxepin (SINEQUAN) 50 MG capsule TAKE 1 CAPSULE(S) BY MOUTH AT BEDTIME AS NEEDED FOR SLEEP   doxepin (SINEQUAN)  50 MG capsule Take 1 capsule(s) by mouth at bedtime as needed for sleep   doxepin (SINEQUAN) 50 MG capsule Take 1 capsule(s) by mouth at bedtime as needed for sleep   Dulaglutide 3 MG/0.5ML SOPN INJECT 3 MG AS DIRECTED ONCE A WEEK.   DULoxetine (CYMBALTA) 30 MG capsule TAKE 1 CAPSULE(S) BY MOUTH DAILY FOR ANXIETY/DEPRESSION   DULoxetine (CYMBALTA) 30 MG capsule TAKE 1 CAPSULE(S) BY MOUTH DAILY FOR ANXIETY/DEPRESSION   DULoxetine (CYMBALTA) 30 MG capsule Take 1 capsule(s) by mouth daily for anxiety/depression   DULoxetine (CYMBALTA) 60 MG capsule Take 1 capsule(s) by mouth at bedtime for anxiety/depression   glucose blood (ONETOUCH VERIO) test strip Use to check blood sugar up to 3 times daily (E11.69, Z79.4)   Insulin Pen Needle 31G X 5 MM MISC use as directed daily   Menthol 7.5 % PTCH Apply 1 patch topically as needed (pain). (Patient not taking: Reported on 0/45/4098)   OneTouch Delica Lancets 11B MISC Use to check blood sugar up to 3 times daily (E11.69, Z79.4)   No current facility-administered medications for this visit. (Other)   REVIEW OF SYSTEMS: ROS   Positive for: Endocrine, Eyes, Respiratory, Psychiatric Negative for: Constitutional, Gastrointestinal, Neurological, Skin, Genitourinary, Musculoskeletal, HENT, Cardiovascular, Allergic/Imm, Heme/Lymph Last edited by Leonie Douglas, COA on 05/21/2021  1:34 PM.     ALLERGIES Allergies  Allergen Reactions   Actos [Pioglitazone] Other (See Comments)    Stomach cramps   Gabapentin Other (See Comments)    Crying spells   Lyrica [Pregabalin] Other (See Comments)    Makes the patient somnolent    PAST MEDICAL HISTORY Past Medical History:  Diagnosis Date   ADHD    ADHD (attention deficit hyperactivity disorder)    Bipolar 1 disorder ([place])    Bipolar disorder ([place])    Diabetes mellitus without complication ([place])    Hyperlipidemia    Morbid obesity ([place])    Obesity    Panic attack    Varicose veins of both lower extremities  with pain    Past Surgical History:  Procedure Laterality Date   surgery on meatus as a child      FAMILY HISTORY Family History  Problem Relation Age of Onset   Rheum arthritis Mother    Diabetes Mother    Heart failure Mother    Stroke Mother    Glaucoma Maternal Grandmother    Lung cancer Paternal Grandmother        smoker    SOCIAL HISTORY Social History   Tobacco Use   Smoking status: Every Day    Packs/day: 1.00    Types: Cigarettes   Smokeless tobacco: Never  Vaping Use   Vaping Use: Never used  Substance Use Topics   Alcohol use: Yes    Comment: Rarely  Drug use: No         OPHTHALMIC EXAM:  Base Eye Exam     Visual Acuity (Snellen - Linear)       Right Left   Dist [place] 20/30 +1 20/60 -2   Dist ph [place] 20/25 20/30 +1         Tonometry (Tonopen, 1:42 PM)       Right Left   Pressure 11 14         Pupils       Dark Light Shape React APD   Right 5 4 Round Brisk None   Left 5 4 Round Brisk None         Visual Fields (Counting fingers)       Left Right    Full Full         Extraocular Movement       Right Left    Full Full         Neuro/Psych     Oriented x3: Yes   Mood/Affect: Normal         Dilation     Both eyes: 1.0% Mydriacyl, 2.5% Phenylephrine @ 1:42 PM           Slit Lamp and Fundus Exam     Slit Lamp Exam       Right Left   Lids/Lashes Mild Meibomian gland dysfunction Mild Meibomian gland dysfunction   Conjunctiva/Sclera White and quiet White and quiet   Cornea 3+ inferior Punctate epithelial erosions 1+ inferior Punctate epithelial erosions, Debris in tear film   Anterior Chamber Deep and quiet Deep and quiet   Iris Round and dilated, No NVI Round and dilated, No NVI   Lens Clear Clear   Vitreous Normal Normal         Fundus Exam       Right Left   Disc Pink and Sharp Pink and Sharp, mild tilt, mild PPA   C/D Ratio 0.3 0.2   Macula Flat, Good foveal reflex, No heme or edema Flat, Good  foveal reflex, mild Retinal pigment epithelial mottling, No heme or edema   Vessels Tortuous Vascular attenuation, severe Tortuous, dilated venules   Periphery Attached, no heme Attached, no heme            IMAGING AND PROCEDURES  Imaging and Procedures for $RemoveBefore'@TODAY'[id number]$ @  OCT, Retina - OU - Both Eyes       Right Eye Quality was good. Central Foveal Thickness: 257. Progression has been stable. Findings include normal foveal contour, no IRF, no SRF.   Left Eye Quality was good. Central Foveal Thickness: 251. Progression has been stable. Findings include normal foveal contour, no IRF, no SRF.   Notes *Images captured and stored on drive  Diagnosis / Impression:  NFP, no IRF/SRF OU No DME OU  Clinical management:  See below  Abbreviations: NFP - Normal foveal profile. CME - cystoid macular edema. PED - pigment epithelial detachment. IRF - intraretinal fluid. SRF - subretinal fluid. EZ - ellipsoid zone. ERM - epiretinal membrane. ORA - outer retinal atrophy. ORT - outer retinal tubulation. SRHM - subretinal hyper-reflective material            ASSESSMENT/PLAN:    ICD-10-CM   1. Diabetes mellitus type 2 without retinopathy ([place])  E11.9     2. Retinal edema  H35.81 OCT, Retina - OU - Both Eyes    3. Essential hypertension  I10     4. Hypertensive retinopathy of both eyes  H35.033       1,2. Diabetes mellitus, type 2 without retinopathy  - HbA1c 12.7 on 07.18.22  - The incidence, risk factors for progression, natural history and treatment options for diabetic retinopathy  were discussed with patient.    - The need for close monitoring of blood glucose, blood pressure, and serum lipids, avoiding cigarette or any type of tobacco, and the need for long term follow up was also discussed with patient.  - f/u in 1 year, sooner prn, DFE, OCT, ?FA  3,4. Hypertensive retinopathy OU  - discussed importance of tight BP control  - history of PE on DVT -- on coumadin  -  monitor   Ophthalmic Meds Ordered this visit:  No orders of the defined types were placed in this encounter.      Return in about 1 year (around 05/21/2022) for f/u DM exam, DFE, OCT, ?FA.  There are no Patient Instructions on file for this visit.  This document serves as a record of services personally performed by Gardiner Sleeper, MD, PhD. It was created on their behalf by Leeann Must, [place], an ophthalmic technician. The creation of this record is the provider's dictation and/or activities during the visit.    Electronically signed by: Leeann Must, COA $RemoveB'@TODAY'[email or web address]$ @ 12:23 AM  This document serves as a record of services personally performed by Gardiner Sleeper, MD, PhD. It was created on their behalf by San Jetty. Owens Shark, OA an ophthalmic technician. The creation of this record is the provider's dictation and/or activities during the visit.    Electronically signed by: San Jetty. Marguerita Merles 08.23.2022 12:23 AM  Gardiner Sleeper, M.D., Ph.D. Diseases & Surgery of the Retina and [place] 05/21/2021   I have reviewed the above documentation for accuracy and completeness, and I agree with the above. Gardiner Sleeper, M.D., Ph.D. 05/26/21 12:23 AM  Abbreviations: M myopia (nearsighted); A astigmatism; H hyperopia (farsighted); P presbyopia; Mrx spectacle prescription;  CTL contact lenses; OD right eye; OS left eye; OU both eyes  XT exotropia; ET esotropia; PEK punctate epithelial keratitis; PEE punctate epithelial erosions; DES dry eye syndrome; MGD meibomian gland dysfunction; ATs artificial tears; PFAT's preservative free artificial tears; [place] nuclear sclerotic cataract; PSC posterior subcapsular cataract; ERM epi-retinal membrane; PVD posterior vitreous detachment; RD retinal detachment; DM diabetes mellitus; DR diabetic retinopathy; NPDR non-proliferative diabetic retinopathy; PDR proliferative diabetic retinopathy; CSME clinically significant macular edema;  DME diabetic macular edema; dbh dot blot hemorrhages; CWS cotton wool spot; POAG primary open angle glaucoma; C/D cup-to-disc ratio; HVF humphrey visual field; GVF goldmann visual field; OCT optical coherence tomography; IOP intraocular pressure; BRVO Branch retinal vein occlusion; CRVO central retinal vein occlusion; CRAO central retinal artery occlusion; BRAO branch retinal artery occlusion; RT retinal tear; SB scleral buckle; PPV pars plana vitrectomy; VH Vitreous hemorrhage; PRP panretinal laser photocoagulation; IVK intravitreal kenalog; VMT vitreomacular traction; MH Macular hole;  NVD neovascularization of the disc; NVE neovascularization elsewhere; AREDS age related eye disease study; ARMD age related macular degeneration; POAG primary open angle glaucoma; EBMD epithelial/anterior basement membrane dystrophy; ACIOL anterior chamber intraocular lens; IOL intraocular lens; PCIOL posterior chamber intraocular lens; Phaco/IOL phacoemulsification with intraocular lens placement; [place] photorefractive keratectomy; LASIK laser assisted in situ keratomileusis; HTN hypertension; DM diabetes mellitus; COPD chronic obstructive pulmonary disease

## 2021-05-21 ENCOUNTER — Other Ambulatory Visit: Payer: Self-pay

## 2021-05-21 ENCOUNTER — Ambulatory Visit (INDEPENDENT_AMBULATORY_CARE_PROVIDER_SITE_OTHER): Payer: Medicare Other | Admitting: Ophthalmology

## 2021-05-21 DIAGNOSIS — H35033 Hypertensive retinopathy, bilateral: Secondary | ICD-10-CM

## 2021-05-21 DIAGNOSIS — I10 Essential (primary) hypertension: Secondary | ICD-10-CM

## 2021-05-21 DIAGNOSIS — E119 Type 2 diabetes mellitus without complications: Secondary | ICD-10-CM | POA: Diagnosis not present

## 2021-05-21 DIAGNOSIS — H3581 Retinal edema: Secondary | ICD-10-CM

## 2021-05-23 ENCOUNTER — Other Ambulatory Visit: Payer: Self-pay

## 2021-05-26 ENCOUNTER — Encounter (INDEPENDENT_AMBULATORY_CARE_PROVIDER_SITE_OTHER): Payer: Self-pay | Admitting: Ophthalmology

## 2021-05-31 ENCOUNTER — Other Ambulatory Visit: Payer: Self-pay

## 2021-05-31 ENCOUNTER — Ambulatory Visit: Payer: Medicare Other | Attending: Family Medicine | Admitting: Pharmacist

## 2021-05-31 DIAGNOSIS — I825Y2 Chronic embolism and thrombosis of unspecified deep veins of left proximal lower extremity: Secondary | ICD-10-CM

## 2021-05-31 DIAGNOSIS — I2699 Other pulmonary embolism without acute cor pulmonale: Secondary | ICD-10-CM

## 2021-05-31 LAB — POCT INR: INR: 2.8 (ref 2.0–3.0)

## 2021-05-31 MED ORDER — DULOXETINE HCL 60 MG PO CPEP
60.0000 mg | ORAL_CAPSULE | Freq: Every day | ORAL | 2 refills | Status: DC
  Filled 2021-05-31 – 2021-06-25 (×3): qty 30, 30d supply, fill #0
  Filled 2021-07-22: qty 30, 30d supply, fill #1

## 2021-05-31 MED ORDER — DOXEPIN HCL 50 MG PO CAPS
50.0000 mg | ORAL_CAPSULE | ORAL | 2 refills | Status: AC
  Filled 2021-06-06: qty 30, 30d supply, fill #0
  Filled 2021-07-05: qty 30, 30d supply, fill #1

## 2021-05-31 MED ORDER — DULOXETINE HCL 30 MG PO CPEP
30.0000 mg | ORAL_CAPSULE | Freq: Every morning | ORAL | 2 refills | Status: DC
  Filled 2021-06-24: qty 30, 30d supply, fill #0
  Filled 2021-07-22: qty 30, 30d supply, fill #1

## 2021-05-31 MED ORDER — ARIPIPRAZOLE 5 MG PO TABS
5.0000 mg | ORAL_TABLET | Freq: Every day | ORAL | 2 refills | Status: AC
  Filled 2021-06-06: qty 30, 30d supply, fill #0
  Filled 2021-07-05: qty 30, 30d supply, fill #1

## 2021-06-07 ENCOUNTER — Other Ambulatory Visit: Payer: Self-pay

## 2021-06-10 ENCOUNTER — Other Ambulatory Visit: Payer: Self-pay

## 2021-06-14 ENCOUNTER — Other Ambulatory Visit: Payer: Self-pay

## 2021-06-24 ENCOUNTER — Other Ambulatory Visit: Payer: Self-pay | Admitting: Family Medicine

## 2021-06-24 ENCOUNTER — Other Ambulatory Visit: Payer: Self-pay

## 2021-06-24 NOTE — Telephone Encounter (Signed)
Requested medications are due for refill today.  yes  Requested medications are on the active medications list.  yes  Last refill. 03/27/2021  Future visit scheduled.   yes  Notes to clinic.  Medication not delegated. 

## 2021-06-25 ENCOUNTER — Other Ambulatory Visit: Payer: Self-pay

## 2021-06-27 ENCOUNTER — Other Ambulatory Visit: Payer: Self-pay

## 2021-06-27 MED ORDER — WARFARIN SODIUM 2.5 MG PO TABS
ORAL_TABLET | ORAL | 2 refills | Status: DC
  Filled 2021-06-27: qty 30, 30d supply, fill #0
  Filled 2021-07-22: qty 30, 30d supply, fill #1
  Filled 2021-08-19: qty 30, 30d supply, fill #2

## 2021-07-01 ENCOUNTER — Other Ambulatory Visit: Payer: Self-pay

## 2021-07-01 ENCOUNTER — Ambulatory Visit: Payer: Medicare Other | Attending: Family Medicine | Admitting: Pharmacist

## 2021-07-01 DIAGNOSIS — I825Y2 Chronic embolism and thrombosis of unspecified deep veins of left proximal lower extremity: Secondary | ICD-10-CM

## 2021-07-01 DIAGNOSIS — I2699 Other pulmonary embolism without acute cor pulmonale: Secondary | ICD-10-CM | POA: Diagnosis not present

## 2021-07-01 LAB — POCT INR: INR: 2.2 (ref 2.0–3.0)

## 2021-07-08 ENCOUNTER — Other Ambulatory Visit: Payer: Self-pay

## 2021-07-09 ENCOUNTER — Other Ambulatory Visit: Payer: Self-pay

## 2021-07-11 ENCOUNTER — Other Ambulatory Visit: Payer: Self-pay

## 2021-07-15 ENCOUNTER — Encounter: Payer: Self-pay | Admitting: Podiatry

## 2021-07-15 ENCOUNTER — Ambulatory Visit (INDEPENDENT_AMBULATORY_CARE_PROVIDER_SITE_OTHER): Payer: Medicare Other | Admitting: Podiatry

## 2021-07-15 ENCOUNTER — Other Ambulatory Visit: Payer: Self-pay

## 2021-07-15 DIAGNOSIS — L608 Other nail disorders: Secondary | ICD-10-CM | POA: Diagnosis not present

## 2021-07-15 DIAGNOSIS — L02611 Cutaneous abscess of right foot: Secondary | ICD-10-CM

## 2021-07-15 DIAGNOSIS — B351 Tinea unguium: Secondary | ICD-10-CM | POA: Diagnosis not present

## 2021-07-15 DIAGNOSIS — L03031 Cellulitis of right toe: Secondary | ICD-10-CM

## 2021-07-15 DIAGNOSIS — M79674 Pain in right toe(s): Secondary | ICD-10-CM | POA: Diagnosis not present

## 2021-07-15 DIAGNOSIS — E1142 Type 2 diabetes mellitus with diabetic polyneuropathy: Secondary | ICD-10-CM | POA: Diagnosis not present

## 2021-07-15 DIAGNOSIS — M79675 Pain in left toe(s): Secondary | ICD-10-CM

## 2021-07-15 MED ORDER — CEPHALEXIN 500 MG PO CAPS
500.0000 mg | ORAL_CAPSULE | Freq: Four times a day (QID) | ORAL | 0 refills | Status: AC
  Filled 2021-07-15: qty 40, 10d supply, fill #0

## 2021-07-15 NOTE — Patient Instructions (Signed)
EPSOM SALT FOOT SOAK INSTRUCTIONS FOR BILATERAL GREAT TOES  *IF YOU HAVE BEEN PRESCRIBED ANTIBIOTICS, TAKE AS INSTRUCTED UNTIL ALL ARE GONE*  Shopping List:  A. Plain epsom salt (not scented) B. Neosporin Cream/Ointment or Bacitracin Cream/Ointment (or prescribed antiobiotic drops/cream/ointment) C. 1-inch fabric band-aids   Place 1/4 cup of epsom salts in 2 quarts of warm tap water. IF YOU ARE DIABETIC, OR HAVE NEUROPATHY, CHECK THE TEMPERATURE OF THE WATER WITH YOUR ELBOW.   Submerge your foot/feet in the solution and soak for 10-15 minutes.      3.  Next, remove your foot/feet from solution, blot dry the affected area.    4.  Apply light amount of antibiotic cream/ointment and cover with fabric band-aid .  5.  This soak should be done once a day for 10 days.   6.  Monitor for any signs/symptoms of infection such as redness, swelling, odor, drainage, increased pain, or non-healing of digit.   7.  Please do not hesitate to call the office and speak to a Nurse or Doctor if you have questions.   8.  If you experience fever, chills, nightsweats, nausea or vomiting with worsening of digit, please go to the emergency room.

## 2021-07-17 ENCOUNTER — Other Ambulatory Visit: Payer: Self-pay

## 2021-07-17 NOTE — Progress Notes (Signed)
Subjective:  Patient ID: Bradley [name], male    DOB: Aug 16, 1981,  MRN: [id number]  Bradley [name] presents to clinic today for at risk foot care with history of diabetic neuropathy and thick, elongated toenails 1-5 bilaterally and also chronic pincer nail deformity of both great toes.    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  Patient states blood glucose was 379 mg/dl today.    PCP is Bradley Register, MD , and last visit was 11/26/2020.  He also sees Dr. Lonzo Cloud in Endocrinology and last visit was 04/15/2021.  My medical assistant brings rednessof his right hallux to his attention and patient states he did not feel any pain in the toe. He states it did not bother him.   Allergies  Allergen Reactions   Actos [Pioglitazone] Other (See Comments)    Stomach cramps   Gabapentin Other (See Comments)    Crying spells   Lyrica [Pregabalin] Other (See Comments)    Makes the patient somnolent    Review of Systems: Negative except as noted in the HPI. Objective:   Constitutional Bradley [name] is a pleasant 40 y.o. Caucasian male, obese in NAD. AAO x 3.   Vascular Capillary refill time to digits immediate b/l. Palpable DP pulse(s) b/l lower extremities Palpable PT pulse(s) b/l lower extremities Pedal hair absent. Lower extremity skin temperature gradient within normal limits. No pain with calf compression b/l. Nonpitting edema noted b/l lower extremities. Varicosities present b/l. No cyanosis or clubbing noted.  Neurologic Normal speech. Oriented to person, place, and time. Pt has subjective symptoms of neuropathy. Protective sensation decreased with 10 gram monofilament b/l lower extremities. Vibratory sensation intact b/l.  Dermatologic Toenails 2-5 bilaterally and L hallux elongated, discolored, dystrophic, thickened, and crumbly with subungual debris and tenderness to dorsal palpation. Pincer nail deformity L hallux and R  hallux. No erythema, no edema, no drainage, no fluctuance. Nail border hypertrophy present right great toe proximal nailfold.  Sign(s) of infection: erythema and pain on palpation present right hallux proximal nailfold. No purulence expressed. Plantar aspect of patient's feet soiled consistent with walking barefoot.  Orthopedic: Normal muscle strength 5/5 to all lower extremity muscle groups bilaterally. No gross bony deformities b/l lower extremities. Patient ambulates independent of any assistive aids.   Radiographs: None Assessment:   1. Pain due to onychomycosis of toenails of both feet   2. Cellulitis of great toe, right   3. Pincer nail deformity   4. Diabetic peripheral neuropathy associated with type 2 diabetes mellitus (HCC)    Plan:  Patient was evaluated and treated and all questions answered. Consent given for treatment as described below: -Examined patient. -Discussed avoiding walking barefoot and what dangers this could impose indoors and outdoors. Patient states his balcony and home are clean and floors are swept daily indicating he will continue to walk barefoot.  -Continue diabetic foot care principles: inspect feet daily, monitor glucose as recommended by PCP and/or Endocrinologist, and follow prescribed diet per PCP, Endocrinologist and/or dietician. -Patient to continue soft, supportive shoe gear daily. -Toenails 2-5 bilaterally and L hallux debrided in length and girth without iatrogenic bleeding with sterile nail nipper and dremel.  -Patient is neuropathic and offending nail border debrided and curretaged R hallux utilizing sterile nail nipper and currette. Border(s) cleansed with alcohol and triple antibiotic ointment applied. Dispensed written instructions for once daily epsom salt soaks for 10 days. -Patient to report any pedal injuries to medical  professional immediately. -Rx for Keflex 500 mg po, #40, to be taken one capsule qid  for 10 days. -Patient/POA to call  should there be no improvement in right great toe.  Return in about 3 months (around 10/15/2021).  Freddie Breech, DPM

## 2021-07-18 ENCOUNTER — Other Ambulatory Visit: Payer: Self-pay

## 2021-07-23 ENCOUNTER — Other Ambulatory Visit: Payer: Self-pay

## 2021-07-31 ENCOUNTER — Other Ambulatory Visit: Payer: Self-pay

## 2021-07-31 ENCOUNTER — Other Ambulatory Visit: Payer: Self-pay | Admitting: Family Medicine

## 2021-07-31 DIAGNOSIS — F4312 Post-traumatic stress disorder, chronic: Secondary | ICD-10-CM | POA: Diagnosis not present

## 2021-07-31 DIAGNOSIS — E1149 Type 2 diabetes mellitus with other diabetic neurological complication: Secondary | ICD-10-CM

## 2021-07-31 DIAGNOSIS — F3132 Bipolar disorder, current episode depressed, moderate: Secondary | ICD-10-CM | POA: Diagnosis not present

## 2021-07-31 DIAGNOSIS — F411 Generalized anxiety disorder: Secondary | ICD-10-CM | POA: Diagnosis not present

## 2021-07-31 MED ORDER — DULOXETINE HCL 30 MG PO CPEP
ORAL_CAPSULE | ORAL | 2 refills | Status: DC
  Filled 2021-07-31 – 2021-08-19 (×2): qty 30, 30d supply, fill #0
  Filled 2021-09-19: qty 30, 30d supply, fill #1
  Filled 2021-10-21: qty 30, 30d supply, fill #0

## 2021-07-31 MED ORDER — BUSPIRONE HCL 7.5 MG PO TABS
ORAL_TABLET | ORAL | 2 refills | Status: DC
  Filled 2021-07-31: qty 60, 30d supply, fill #0

## 2021-07-31 MED ORDER — DULOXETINE HCL 60 MG PO CPEP
ORAL_CAPSULE | ORAL | 2 refills | Status: AC
  Filled 2021-07-31 – 2021-08-19 (×2): qty 30, 30d supply, fill #0
  Filled 2021-09-19: qty 30, 30d supply, fill #1
  Filled 2021-10-21: qty 30, 30d supply, fill #0

## 2021-07-31 MED ORDER — ARIPIPRAZOLE 5 MG PO TABS
ORAL_TABLET | ORAL | 2 refills | Status: DC
  Filled 2021-07-31 – 2021-08-05 (×2): qty 30, 30d supply, fill #0
  Filled 2021-09-04: qty 30, 30d supply, fill #1
  Filled 2021-10-07: qty 30, 30d supply, fill #0

## 2021-07-31 MED ORDER — DOXEPIN HCL 50 MG PO CAPS
ORAL_CAPSULE | ORAL | 2 refills | Status: DC
  Filled 2021-07-31 – 2021-08-05 (×3): qty 30, 30d supply, fill #0
  Filled 2021-09-04: qty 30, 30d supply, fill #1
  Filled 2021-10-07: qty 30, 30d supply, fill #0

## 2021-07-31 NOTE — Telephone Encounter (Signed)
Requested medication (s) are due for refill today - yes  Requested medication (s) are on the active medication list -yes  Future visit scheduled -no  Last refill: 05/10/21 #60 2RF  Notes to clinic: Request RF: non delegated Rx  Requested Prescriptions  Pending Prescriptions Disp Refills   traMADol (ULTRAM) 50 MG tablet 60 tablet 2    Sig: TAKE 1 TABLET (50 MG TOTAL) BY MOUTH EVERY 12 (TWELVE) HOURS AS NEEDED (FOR PAIN).     Not Delegated - Analgesics:  Opioid Agonists Failed - 07/31/2021  9:48 AM      Failed - This refill cannot be delegated      Failed - Urine Drug Screen completed in last 360 days      Failed - Valid encounter within last 6 months    Recent Outpatient Visits           8 months ago Type 2 diabetes mellitus with hyperglycemia, with long-term current use of insulin (HCC)   [facility] [place], [place], MD   11 months ago Type 2 diabetes mellitus with hyperglycemia, with long-term current use of insulin (HCC)   [facility] [place], [place], MD   1 year ago Type 2 diabetes mellitus with hyperglycemia, with long-term current use of insulin (HCC)   [facility] [place], Odette Horns, MD   1 year ago Non-compliance   [facility] [place], Odette Horns, MD   1 year ago Acute pulmonary embolism without acute cor pulmonale, unspecified pulmonary embolism type [facility])   [facility] [place], Cornelius Moras, RPH-CPP                 Requested Prescriptions  Pending Prescriptions Disp Refills   traMADol (ULTRAM) 50 MG tablet 60 tablet 2    Sig: TAKE 1 TABLET (50 MG TOTAL) BY MOUTH EVERY 12 (TWELVE) HOURS AS NEEDED (FOR PAIN).     Not Delegated - Analgesics:  Opioid Agonists Failed - 07/31/2021  9:48 AM      Failed - This refill cannot be delegated      Failed - Urine Drug Screen completed in last 360 days      Failed - Valid  encounter within last 6 months    Recent Outpatient Visits           8 months ago Type 2 diabetes mellitus with hyperglycemia, with long-term current use of insulin (HCC)   [facility] [place], [place], MD   11 months ago Type 2 diabetes mellitus with hyperglycemia, with long-term current use of insulin (HCC)   [facility] [employer], [place], MD   1 year ago Type 2 diabetes mellitus with hyperglycemia, with long-term current use of insulin (HCC)   [facility] [place] Register, MD   1 year ago Non-compliance    [facility] [place], Odette Horns, MD   1 year ago Acute pulmonary embolism without acute cor pulmonale, unspecified pulmonary embolism type [facility])   [facility] [place], Cornelius Moras, RPH-CPP

## 2021-08-01 ENCOUNTER — Ambulatory Visit: Payer: Medicare Other | Attending: Family Medicine | Admitting: Pharmacist

## 2021-08-01 ENCOUNTER — Other Ambulatory Visit: Payer: Self-pay

## 2021-08-01 DIAGNOSIS — I2699 Other pulmonary embolism without acute cor pulmonale: Secondary | ICD-10-CM

## 2021-08-01 DIAGNOSIS — I825Y2 Chronic embolism and thrombosis of unspecified deep veins of left proximal lower extremity: Secondary | ICD-10-CM

## 2021-08-01 LAB — POCT INR: INR: 2.7 (ref 2.0–3.0)

## 2021-08-05 ENCOUNTER — Encounter: Payer: Self-pay | Admitting: Family Medicine

## 2021-08-05 ENCOUNTER — Other Ambulatory Visit: Payer: Self-pay

## 2021-08-05 ENCOUNTER — Other Ambulatory Visit: Payer: Self-pay | Admitting: Pharmacist

## 2021-08-05 ENCOUNTER — Ambulatory Visit: Payer: Medicare Other | Attending: Family Medicine | Admitting: Family Medicine

## 2021-08-05 DIAGNOSIS — E1149 Type 2 diabetes mellitus with other diabetic neurological complication: Secondary | ICD-10-CM | POA: Diagnosis not present

## 2021-08-05 DIAGNOSIS — E781 Pure hyperglyceridemia: Secondary | ICD-10-CM | POA: Diagnosis not present

## 2021-08-05 MED ORDER — TRAMADOL HCL 50 MG PO TABS
50.0000 mg | ORAL_TABLET | Freq: Two times a day (BID) | ORAL | 0 refills | Status: DC | PRN
  Filled 2021-08-05: qty 60, 30d supply, fill #0

## 2021-08-05 MED ORDER — ATORVASTATIN CALCIUM 20 MG PO TABS
ORAL_TABLET | Freq: Every day | ORAL | 3 refills | Status: DC
  Filled 2021-08-05: qty 30, 30d supply, fill #0
  Filled 2021-09-04: qty 30, 30d supply, fill #1
  Filled 2021-10-09: qty 30, 30d supply, fill #0
  Filled 2021-11-04: qty 30, 30d supply, fill #1

## 2021-08-05 NOTE — Progress Notes (Signed)
Virtual Visit via Telephone Note  I connected with Bradley [name], on 08/05/2021 at 8:13 AM by telephone due to the COVID-19 pandemic and verified that I am speaking with the correct person using two identifiers.   Consent: I discussed the limitations, risks, security and privacy concerns of performing an evaluation and management service by telephone and the availability of in person appointments. I also discussed with the patient that there may be a patient responsible charge related to this service. The patient expressed understanding and agreed to proceed.   Location of Patient: Home  Location of Provider: Clinic   Persons participating in Telemedicine visit: Bradley [name] Dr. Margarita Rana     History of Present Illness: Bradley [name] is a 40 y.o. year old male with  a history of morbid obesity, type 2 diabetes mellitus (A1c 13.6 ), non compliance,diabetic neuropathy, Bipolar disorder, previous DVT in 2017, PE (diagnosed in 10/2018 - on chronic anticoagulation with Coumadin).   He is on tramadol chronically for peripheral neuropathy as he had adverse effects to gabapentin and Lyrica.  He takes tramadol twice a day around-the-clock and not only when he is in pain. Denies presence of substance abuse, drinks alcohol socially on rare occasions and smokes 1 pack of cigarettes per day. Does have an underlying bipolar disorder. His last visit with me was in 10/2020 and he has not had an in person visit in a while. Past Medical History:  Diagnosis Date   ADHD    ADHD (attention deficit hyperactivity disorder)    Bipolar 1 disorder (HCC)    Bipolar disorder (HCC)    Diabetes mellitus without complication ([place])    Hyperlipidemia    Morbid obesity ([place])    Obesity    Panic attack    Varicose veins of both lower extremities with pain    Allergies  Allergen Reactions   Actos [Pioglitazone] Other (See Comments)    Stomach cramps   Gabapentin Other (See Comments)    Crying  spells   Lyrica [Pregabalin] Other (See Comments)    Makes the patient somnolent    Current Outpatient Medications on File Prior to Visit  Medication Sig Dispense Refill   albuterol (VENTOLIN HFA) 108 (90 Base) MCG/ACT inhaler INHALE 1-2 PUFFS INTO THE LUNGS EVERY 6 (SIX) HOURS AS NEEDED FOR WHEEZING OR SHORTNESS OF BREATH. 18 g 2   ARIPiprazole (ABILIFY) 5 MG tablet TAKE 1 TABLET(S) BY MOUTH AT BEDTIME FOR MOOD 30 tablet 1   ARIPiprazole (ABILIFY) 5 MG tablet TAKE 1 TABLET(S) BY MOUTH AT BEDTIME FOR MOOD 30 tablet 1   ARIPiprazole (ABILIFY) 5 MG tablet TAKE 1 TABLET(S) BY MOUTH AT BEDTIME FOR MOOD 30 tablet 0   ARIPiprazole (ABILIFY) 5 MG tablet TAKE 1 TABLET(S) BY MOUTH AT BEDTIME FOR MOOD 30 tablet 1   ARIPiprazole (ABILIFY) 5 MG tablet Take 1 tablet(s) by mouth at bedtime for mood 30 tablet 1   ARIPiprazole (ABILIFY) 5 MG tablet Take 1 tablet(s) by mouth at bedtime for mood 30 tablet 2   ARIPiprazole (ABILIFY) 5 MG tablet Take 1 tablet (5 mg total) by mouth at bedtime. 30 tablet 2   ARIPiprazole (ABILIFY) 5 MG tablet Take 1 tablet(s) by mouth at bedtime for mood 30 tablet 2   atorvastatin (LIPITOR) 20 MG tablet Take 1 tablet by mouth daily. 30 tablet 3   Blood Glucose Monitoring Suppl (ONETOUCH VERIO) w/Device KIT Use to check blood sugar up to 3 times daily (E11.69, Z79.4) 1 kit 0  busPIRone (BUSPAR) 7.5 MG tablet Take 7.5 mg by mouth 2 (two) times daily.     busPIRone (BUSPAR) 7.5 MG tablet TAKE 1 TABLET(S) BY MOUTH TWICE A DAY FOR SEVERE ANXIETY/PANIC ATTACKS 60 tablet 1   busPIRone (BUSPAR) 7.5 MG tablet TAKE 1 TABLET(S) BY MOUTH TWICE A DAY FOR SEVERE ANXIETY/PANIC ATTACKS 60 tablet 0   busPIRone (BUSPAR) 7.5 MG tablet Take 1 tablet(s) by mouth twice a day for severe anxiety/panic attacks 60 tablet 1   busPIRone (BUSPAR) 7.5 MG tablet Take 1 tablet(s) by mouth twice a day for severe anxiety/panic attacks 60 tablet 2   busPIRone (BUSPAR) 7.5 MG tablet Take 1 tablet(s) by mouth twice a  day for severe anxiety/panic attacks 60 tablet 2   doxepin (SINEQUAN) 25 MG capsule Take 25 mg by mouth at bedtime.     doxepin (SINEQUAN) 25 MG capsule TAKE 1 CAPSULE(S) BY MOUTH AT BEDTIME AS NEEDED FOR SLEEP 30 capsule 0   doxepin (SINEQUAN) 50 MG capsule Take 50 mg by mouth at bedtime as needed.     doxepin (SINEQUAN) 50 MG capsule TAKE 1 CAPSULE(S) BY MOUTH AT BEDTIME AS NEEDED FOR SLEEP 30 capsule 1   doxepin (SINEQUAN) 50 MG capsule TAKE 1 CAPSULE(S) BY MOUTH AT BEDTIME AS NEEDED FOR SLEEP 30 capsule 1   doxepin (SINEQUAN) 50 MG capsule Take 1 capsule(s) by mouth at bedtime as needed for sleep 30 capsule 1   doxepin (SINEQUAN) 50 MG capsule Take 1 capsule(s) by mouth at bedtime as needed for sleep 30 capsule 2   doxepin (SINEQUAN) 50 MG capsule Take 1 capsule (50 mg total) by mouth as needed for sleep 30 capsule 2   doxepin (SINEQUAN) 50 MG capsule Take 1 capsule(s) by mouth at bedtime as needed for sleep 30 capsule 2   Dulaglutide 3 MG/0.5ML SOPN INJECT 3 MG AS DIRECTED ONCE A WEEK. 6 mL 3   DULoxetine (CYMBALTA) 30 MG capsule Take 30 mg by mouth daily.     DULoxetine (CYMBALTA) 30 MG capsule TAKE 1 CAPSULE(S) BY MOUTH DAILY FOR ANXIETY/DEPRESSION 30 capsule 1   DULoxetine (CYMBALTA) 30 MG capsule TAKE 1 CAPSULE(S) BY MOUTH DAILY FOR ANXIETY/DEPRESSION 30 capsule 1   DULoxetine (CYMBALTA) 30 MG capsule Take 1 capsule (30 mg total) by mouth every morning. 30 capsule 2   DULoxetine (CYMBALTA) 30 MG capsule Take 1 capsule(s) by mouth daily for anxiety/depression 30 capsule 2   DULoxetine (CYMBALTA) 60 MG capsule Take 1 capsule (60 mg total) by mouth at bedtime. 30 capsule 2   DULoxetine (CYMBALTA) 60 MG capsule Take 1 capsule(s) by mouth at bedtime for anxiety/depression 30 capsule 2   glucose blood (ONETOUCH VERIO) test strip Use to check blood sugar up to 3 times daily (E11.69, Z79.4) 100 each 11   insulin degludec (TRESIBA FLEXTOUCH) 100 UNIT/ML FlexTouch Pen Inject 35 Units into the skin  daily. 30 mL 3   Insulin Pen Needle 31G X 5 MM MISC use as directed daily 100 each 3   Menthol 7.5 % PTCH Apply 1 patch topically as needed (pain). (Patient not taking: Reported on 05/21/2021)     OneTouch Delica Lancets 43X MISC Use to check blood sugar up to 3 times daily (E11.69, Z79.4) 100 each 11   sildenafil (VIAGRA) 50 MG tablet Take 1 tablet (50 mg total) by mouth daily as needed for erectile dysfunction. At least 24 hours between doses 10 tablet 1   traMADol (ULTRAM) 50 MG tablet TAKE 1 TABLET (50 MG  TOTAL) BY MOUTH EVERY 12 (TWELVE) HOURS AS NEEDED (FOR PAIN). 60 tablet 2   warfarin (COUMADIN) 2.5 MG tablet TAKE BY MOUTH DAILY AS DIRECTED BY THE COUMADIN CLINIC. 30 tablet 2   No current facility-administered medications on file prior to visit.    ROS: See HPI  Observations/Objective: Awake, alert, oriented x3 Not in acute distress Normal mood CMP Latest Ref Rng & Units 03/19/2021 12/06/2020 04/19/2020  Glucose 70 - 99 mg/dL 481(H) 398(H) 445(H)  BUN 6 - 20 mg/dL 11 7 <5(L)  Creatinine 0.61 - 1.24 mg/dL 0.99 0.78 0.81  Sodium 135 - 145 mmol/L 133(L) 140 133(L)  Potassium 3.5 - 5.1 mmol/L 4.4 4.7 4.4  Chloride 98 - 111 mmol/L 97(L) 97 94(L)  CO2 22 - 32 mmol/L 27 - 28  Calcium 8.9 - 10.3 mg/dL 9.0 9.7 10.0  Total Protein 6.5 - 8.1 g/dL 7.3 7.6 7.6  Total Bilirubin 0.3 - 1.2 mg/dL 0.6 0.5 0.7  Alkaline Phos 38 - 126 U/L 83 110 124  AST 15 - 41 U/L $Remo'20 31 29  'hcWwq$ ALT 0 - 44 U/L 33 - 46(H)    Lipid Panel     Component Value Date/Time   CHOL 185 12/06/2020 1456   TRIG 236 (H) 12/06/2020 1456   HDL 47 12/06/2020 1456   CHOLHDL 3.9 12/06/2020 1456   CHOLHDL 4.0 11/12/2016 1613   VLDL 28 11/12/2016 1613   LDLCALC 98 12/06/2020 1456   LABVLDL 40 12/06/2020 1456    Lab Results  Component Value Date   HGBA1C 12.7 (A) 04/15/2021    Assessment and Plan: 1. Other diabetic neurological complication associated with type 2 diabetes mellitus ([place]) He is on tramadol  chronically Discussed adverse effects of chronic opioid use including dependence He is due for urine drug screen which will be performed at his next visit Advised that rather than take tramadol around-the-clock he should try to cut back to as needed Continue statin for primary cardiovascular prevention - traMADol (ULTRAM) 50 MG tablet; Take 1 tablet (50 mg total) by mouth every 12 (twelve) hours as needed.  Dispense: 60 tablet; Refill: 0 - atorvastatin (LIPITOR) 20 MG tablet; Take 1 tablet by mouth daily.  Dispense: 30 tablet; Refill: 3  2. Hypertriglyceridemia - atorvastatin (LIPITOR) 20 MG tablet; Take 1 tablet by mouth daily.  Dispense: 30 tablet; Refill: 3   Follow Up Instructions: Advised to schedule I month in person visit as he will need a urine drug screen at that visit.   I discussed the assessment and treatment plan with the patient. The patient was provided an opportunity to ask questions and all were answered. The patient agreed with the plan and demonstrated an understanding of the instructions.   The patient was advised to call back or seek an in-person evaluation if the symptoms worsen or if the condition fails to improve as anticipated.     I provided 11 minutes total of non-face-to-face time during this encounter.   Charlott Rakes, MD, FAAFP. [facility] and [name], [place]   08/05/2021, 8:13 AM

## 2021-08-06 ENCOUNTER — Other Ambulatory Visit: Payer: Self-pay

## 2021-08-16 ENCOUNTER — Ambulatory Visit (INDEPENDENT_AMBULATORY_CARE_PROVIDER_SITE_OTHER): Payer: Medicare Other | Admitting: Internal Medicine

## 2021-08-16 ENCOUNTER — Other Ambulatory Visit: Payer: Self-pay

## 2021-08-16 ENCOUNTER — Encounter: Payer: Self-pay | Admitting: Dietician

## 2021-08-16 ENCOUNTER — Encounter: Payer: Self-pay | Admitting: Internal Medicine

## 2021-08-16 ENCOUNTER — Encounter: Payer: Medicare Other | Attending: Internal Medicine | Admitting: Dietician

## 2021-08-16 VITALS — BP 114/62 | HR 106 | Ht 74.0 in | Wt 282.0 lb

## 2021-08-16 DIAGNOSIS — Z794 Long term (current) use of insulin: Secondary | ICD-10-CM | POA: Insufficient documentation

## 2021-08-16 DIAGNOSIS — E1169 Type 2 diabetes mellitus with other specified complication: Secondary | ICD-10-CM

## 2021-08-16 DIAGNOSIS — E1165 Type 2 diabetes mellitus with hyperglycemia: Secondary | ICD-10-CM

## 2021-08-16 LAB — POCT GLYCOSYLATED HEMOGLOBIN (HGB A1C): Hemoglobin A1C: 12.7 % — AB (ref 4.0–5.6)

## 2021-08-16 LAB — GLUCOSE, POCT (MANUAL RESULT ENTRY): POC Glucose: 450 mg/dl — AB (ref 70–99)

## 2021-08-16 MED ORDER — DEXCOM G6 TRANSMITTER MISC: 1.0000 | 3 refills | Status: DC

## 2021-08-16 MED ORDER — ONETOUCH VERIO VI STRP
1.0000 | ORAL_STRIP | Freq: Three times a day (TID) | 3 refills | Status: DC
Start: 2021-08-16 — End: 2021-12-20

## 2021-08-16 MED ORDER — ONETOUCH DELICA LANCETS 33G MISC
1.0000 | Freq: Three times a day (TID) | 3 refills | Status: DC
  Filled 2021-08-16: qty 300, fill #0

## 2021-08-16 MED ORDER — DEXCOM G6 SENSOR MISC: 1.0000 | 3 refills | Status: DC

## 2021-08-16 MED ORDER — INSULIN PEN NEEDLE 31G X 5 MM MISC
1.0000 | Freq: Three times a day (TID) | 3 refills | Status: DC
Start: 2021-08-16 — End: 2022-07-21
  Filled 2021-08-16: qty 100, 33d supply, fill #0

## 2021-08-16 MED ORDER — ONETOUCH DELICA LANCETS 33G MISC: 1.0000 | Freq: Three times a day (TID) | 3 refills | Status: DC

## 2021-08-16 MED ORDER — TRESIBA FLEXTOUCH 100 UNIT/ML ~~LOC~~ SOPN
35.0000 [IU] | PEN_INJECTOR | Freq: Every day | SUBCUTANEOUS | 3 refills | Status: DC
Start: 2021-08-16 — End: 2021-12-19
  Filled 2021-08-16 – 2021-11-04 (×3): qty 30, 85d supply, fill #0

## 2021-08-16 MED ORDER — NOVOLOG FLEXPEN 100 UNIT/ML ~~LOC~~ SOPN
8.0000 [IU] | PEN_INJECTOR | Freq: Three times a day (TID) | SUBCUTANEOUS | 3 refills | Status: DC
  Filled 2021-08-16: qty 6, 25d supply, fill #0

## 2021-08-16 MED ORDER — ONETOUCH VERIO VI STRP
1.0000 | ORAL_STRIP | Freq: Three times a day (TID) | 3 refills | Status: DC
  Filled 2021-08-16: qty 300, 100d supply, fill #0

## 2021-08-16 NOTE — Patient Instructions (Addendum)
-   Start Novolog 8 units with each meal  - Continue Trulicity 3 mg weekly  - Continue Tresiba 35 units once daily      HOW TO TREAT LOW BLOOD SUGARS (Blood sugar LESS THAN 70 MG/DL) Please follow the RULE OF 15 for the treatment of hypoglycemia treatment (when your (blood sugars are less than 70 mg/dL)   STEP 1: Take 15 grams of carbohydrates when your blood sugar is low, which includes:  3-4 GLUCOSE TABS  OR 3-4 OZ OF JUICE OR REGULAR SODA OR ONE TUBE OF GLUCOSE GEL    STEP 2: RECHECK blood sugar in 15 MINUTES STEP 3: If your blood sugar is still low at the 15 minute recheck --> then, go back to STEP 1 and treat AGAIN with another 15 grams of carbohydrates.

## 2021-08-16 NOTE — Progress Notes (Signed)
Name: Bradley [name]  Age/ Sex: 40 y.o., male   MRN/ DOB: [id number], [birth year]     PCP: Charlott Rakes, MD   Reason for Endocrinology Evaluation: Type 2 Diabetes Mellitus  Initial Endocrine Consultative Visit: 10/18/2020    PATIENT IDENTIFIER: Bradley [name] is a 40 y.o. male with a past medical history of T2Dm, DVT and bipolar disorder. The patient has followed with Endocrinology clinic since 10/18/2020 for consultative assistance with management of his diabetes.  DIABETIC HISTORY:  Bradley [name] was diagnosed with DM yrs ago. Basaglar made him feel sluggish. His hemoglobin A1c has ranged from 7.7% in 2019, peaking at 14.9% in 2021.   On his initial visit to our clinic his A1c was 13.6 %, he DECLINED insulin. He was on Glipizide and Trulicity, we increased Glipizide, Trulicity and added Pioglitazone. By the time he went  To see our RD in 12/2020 he was off pioglitazone  Due to abdominal cramps    Stopped Glipizide and started basal insulin 12/2020  Lives with girlfriend    He is disabled due to mental health SUBJECTIVE:   During the last visit (04/15/2021): 40.9 % Continued Trulicity, increased tresiba      Today (08/16/2021): Bradley [name] is here for a follow up on diabetes management. He is accompanied by his girl friend Geologist, engineering.   He has not been checking his glucose at home.   Denies abdomina pain or nausea    He is not taking   HOME DIABETES REGIMEN:  Trulicity  3 mg weekly  Tresiba 35 units daily     Statin: no ACE-I/ARB: no Prior Diabetic Education: yes   METER DOWNLOAD SUMMARY: Did not bring    DIABETIC COMPLICATIONS: Microvascular complications:  neuropathy Denies: CKD retinopathy Last Eye Exam: Completed 08/2020  Macrovascular complications:   Denies: CAD, CVA, PVD   HISTORY:  Past Medical History:  Past Medical History:  Diagnosis Date   ADHD    ADHD (attention deficit hyperactivity disorder)    Bipolar 1 disorder ([place])     Bipolar disorder ([place])    Diabetes mellitus without complication ([place])    Hyperlipidemia    Morbid obesity ([place])    Obesity    Panic attack    Varicose veins of both lower extremities with pain    Past Surgical History:  Past Surgical History:  Procedure Laterality Date   surgery on meatus as a child     Social History:  reports that he has been smoking cigarettes. He has been smoking an average of 1 pack per day. He has never used smokeless tobacco. He reports current alcohol use. He reports that he does not use drugs. Family History:  Family History  Problem Relation Age of Onset   Rheum arthritis Mother    Diabetes Mother    Heart failure Mother    Stroke Mother    Glaucoma Maternal Grandmother    Lung cancer Paternal Grandmother        smoker     HOME MEDICATIONS: Allergies as of 08/16/2021       Reactions   Actos [pioglitazone] Other (See Comments)   Stomach cramps   Gabapentin Other (See Comments)   Crying spells   Lyrica [pregabalin] Other (See Comments)   Makes the patient somnolent        Medication List        Accurate as of August 16, 2021  2:35 PM. If you have any questions, ask your nurse or doctor.  albuterol 108 (90 Base) MCG/ACT inhaler Commonly known as: VENTOLIN HFA INHALE 1-2 PUFFS INTO THE LUNGS EVERY 6 (SIX) HOURS AS NEEDED FOR WHEEZING OR SHORTNESS OF BREATH.   ARIPiprazole 5 MG tablet Commonly known as: ABILIFY Take 1 tablet (5 mg total) by mouth at bedtime.   ARIPiprazole 5 MG tablet Commonly known as: Abilify Take 1 tablet(s) by mouth at bedtime for mood   atorvastatin 20 MG tablet Commonly known as: LIPITOR Take 1 tablet by mouth daily.   B-D UF III MINI PEN NEEDLES 31G X 5 MM Misc Generic drug: Insulin Pen Needle use as directed daily   busPIRone 7.5 MG tablet Commonly known as: BUSPAR Take 1 tablet(s) by mouth twice a day for severe anxiety/panic attacks   doxepin 50 MG capsule Commonly known as:  SINEQUAN Take 1 capsule (50 mg total) by mouth as needed for sleep   doxepin 50 MG capsule Commonly known as: SINEQUAN Take 1 capsule(s) by mouth at bedtime as needed for sleep   DULoxetine 60 MG capsule Commonly known as: CYMBALTA Take 1 capsule (60 mg total) by mouth at bedtime.   DULoxetine 30 MG capsule Commonly known as: CYMBALTA Take 1 capsule (30 mg total) by mouth every morning.   DULoxetine 30 MG capsule Commonly known as: CYMBALTA Take 1 capsule(s) by mouth daily for anxiety/depression   DULoxetine 60 MG capsule Commonly known as: CYMBALTA Take 1 capsule(s) by mouth at bedtime for anxiety/depression   glucose blood test strip Commonly known as: OneTouch Verio Use to check blood sugar up to 3 times daily (E11.69, Z79.4)   Menthol 7.5 % Ptch Apply 1 patch topically as needed (pain).   OneTouch Delica Lancets 50I Misc Use to check blood sugar up to 3 times daily (E11.69, Z79.4)   OneTouch Verio w/Device Kit Use to check blood sugar up to 3 times daily (E11.69, Z79.4)   sildenafil 50 MG tablet Commonly known as: Viagra Take 1 tablet (50 mg total) by mouth daily as needed for erectile dysfunction. At least 24 hours between doses   traMADol 50 MG tablet Commonly known as: ULTRAM Take 1 tablet (50 mg total) by mouth every 12 (twelve) hours as needed.   Tyler Aas FlexTouch 100 UNIT/ML FlexTouch Pen Generic drug: insulin degludec Inject 35 Units into the skin daily.   Trulicity 3 BB/0.4UG Sopn Generic drug: Dulaglutide INJECT 3 MG AS DIRECTED ONCE A WEEK.   warfarin 2.5 MG tablet Commonly known as: COUMADIN Take as directed by the anticoagulation clinic. If you are unsure how to take this medication, talk to your nurse or doctor. Original instructions: TAKE BY MOUTH DAILY AS DIRECTED BY THE COUMADIN CLINIC.         OBJECTIVE:   Vital Signs: BP 114/62 (BP Location: Left Arm, Patient Position: Sitting, Cuff Size: Small)   Pulse (!) 106   Ht _0  (1.88  m)   Wt 282 lb (127.9 kg)   SpO2 99%   BMI 36.21 kg/m   Wt Readings from Last 3 Encounters:  08/16/21 282 lb (127.9 kg)  04/15/21 289 lb (131.1 kg)  03/19/21 291 lb 9.6 oz (132.3 kg)     Exam: General: Pt appears well and is in NAD  Lungs: Clear with good BS bilat with no rales, rhonchi, or wheezes  Heart: RRR with normal S1 and S2 and no gallops; no murmurs; no rub  Abdomen: Normoactive bowel sounds, soft, nontender, without masses or organomegaly palpable  Extremities: No pretibial edema.  Stasis dermatitis over  the shins  Neuro: MS is good with appropriate affect, pt is alert and Ox3     DM foot exam: 10/18/2020   The skin of the feet is intact without sores or ulcerations. The pedal pulses are 2+ on right and 2+ on left. The sensation is decrease to a screening 5.07, 10 gram monofilament bilaterally    DATA REVIEWED:  Lab Results  Component Value Date   HGBA1C 12.7 (A) 08/16/2021   HGBA1C 12.7 (A) 04/15/2021   HGBA1C 11.3 (A) 01/09/2021   Lab Results  Component Value Date   MICROALBUR 0.5 06/19/2016   LDLCALC 98 12/06/2020   CREATININE 0.99 03/19/2021   Lab Results  Component Value Date   MICRALBCREAT 20 12/06/2020     Lab Results  Component Value Date   CHOL 185 12/06/2020   HDL 47 12/06/2020   LDLCALC 98 12/06/2020   TRIG 236 (H) 12/06/2020   CHOLHDL 3.9 12/06/2020         ASSESSMENT / PLAN / RECOMMENDATIONS:   1) Type 2 Diabetes Mellitus, Poorly controlled, With Neuropathic  complications - Most recent A1c of 12.7 %. Goal A1c < 7.0 %.   - Pt continues with hyperglycemia, he continues with dietary indiscretions and medication non-adherence  - His main barriers to diabetes self care if mental heath illness  - Interestingly enough he takes daily insulin but he does not take Trulicity, I have provided him with a pt assistance paper work on his last visit for Trulicity but he never brought it back  - He had cream soda before coming here today  -He is  intolerant to pioglitazone due to abdominal pain - I have recommended prandial insulin which he reluctantly agreed to  - Will prescribe Dexcom     MEDICATIONS:  Continue Trulicity 3 mg weekly  Continue  Tresiba 35 units once daily  Start Novolog 8 units with each meal   EDUCATION / INSTRUCTIONS: BG monitoring instructions: Patient is instructed to check his blood sugars 3 times a day, before meals  Call [facility] if: BG persistently < 70  I reviewed the Rule of 15 for the treatment of hypoglycemia in detail with the patient. Literature supplied.    2) Diabetic complications:  Eye: Does not have known diabetic retinopathy.  Neuro/ Feet: Does  have known diabetic peripheral neuropathy  Renal: Patient does not have known baseline CKD. He   is not on an ACEI/ARB at present.      3) Hypertriglyceridemia : We had discussed ADA recommendations in statin use between ages 83-75 in the past . We had discussed cardiovascular benefits of statins.  Will consider starting therapy after his labs on next visit     F/U in 3 months  Signed electronically by: Mack Guise, MD  [facility]  [facility] [address] [facility] [address], [place] [address] Phone: [phone] FAX: [phone]   CC: Charlott Rakes, [place] [address] Phone: [phone]  Fax: [phone]  Return to Endocrinology clinic as below: Future Appointments  Date Time Provider [place]  08/16/2021  2:40 PM Jodette Wik, Melanie Crazier, MD [facility] None  09/02/2021  1:30 PM Tresa Endo, RPH-CPP [facility] None  09/17/2021  2:30 PM [facility] None  09/17/2021  3:00 PM Lincoln Brigham, PA-C [facility] None  10/21/2021  3:30 PM Charlott Rakes, MD [facility] None  10/21/2021  4:00 PM Marzetta Board, DPM [facility]  10/24/2021  2:30  PM Clydell Hakim, RD [place] [facility]  05/21/2022  1:00  PM Bernarda Caffey, MD [facility] None

## 2021-08-16 NOTE — Patient Instructions (Addendum)
Restart the Trulicity unless your MD changes this. Continue to take your insulin and other medications as prescribed. Rotate where you give your insulin injection. Get fresh air and sunlight daily.   Great job changing your drinks!  Recommend starting at the gym.  Exercise can lower your blood glucose and improve your mental health.  Begin testing your blood sugar.  Goals:  Fasting  80-130  2 hours after you eat - less than 180

## 2021-08-16 NOTE — Progress Notes (Signed)
Diabetes Self-Management Education  Visit Type: Follow-up  APatient is here today with his girlfriend.  He was last seen by this RD on 05/09/2021. ppt. Start Time: 1355 Appt. End Time: 1425  08/16/2021  Mr. Carley Hammed, identified by name and date of birth, is a 40 y.o. male with a diagnosis of Diabetes:  .   ASSESSMENT  He states that his blood sugar is high although he is not checking his blood glucose.  He states that he feels more sleepy and sluggish. Depression worse (winter season).  Has not been taking the Trulicity as he fears the pain although he states this does not hurt when he does use it. Takes 35 units Tresiba q HS and states that he forgets this rarely.  Weight decreased.  A1C decreased but remains very high.  History includes Type 2 Diabetes (2019), DVT, Bipolar, ADHD, neuropathy, smoking, memory issues Labs noted to include:  A1C 12.7% 08/16/2021, 13.6% 08/09/2020 increased from 13.2% 05/03/2020, cholesterol 193, Triglycerides 365, HDL 40, LDL 92 08/09/2020 Medications include:  Trulicity (not taking), glipizide (discontinued), actos (discontinued), Tresiba 35 units q HS, coumadin, Abilify and other medications that contribute to weight gain. He stopped pioglitizone after 3 days due to stomach cramps. He could not tolerated Metformin due to bowel incontinence.    Sleep:  Eats late (10pm - midnight).  Goes to sleep late.  Wakes about 10:30 am. Support:  [facility], patient of Dr. Lonzo Cloud at [facility], PCP Hoy Register, MD   Weight: 282 lbs 08/16/2021 loss likely due to uncontrolled blood glucose 295 lbs 02/14/21 306 lbs 47/2022 306 lbs 11/23/2020 425 lbs highest adult weight 2 years ago (loss presumably due to uncontrolled blood sugar) Lowest adult weight 235 lbs about 8 years ago and he does not recall circumstances of increased weight gain.   Patient lives with his girlfriend.  He does the shopping  and cooking. He is on disability.  Finances are a concern. His mother passed away 2020-11-23 from complications of diabetes. He is planning on quitting smoking and has a support system of people that are willing to quit with him. Girlfriend  goes to the gym 2-3 times per week [facility]). There were no vitals taken for this visit. There is no height or weight on file to calculate BMI.   Diabetes Self-Management Education - 08/16/21 1444       Visit Information   Visit Type Follow-up      Psychosocial Assessment   Patient Belief/Attitude about Diabetes Other (comment)   increased barriers   Self-care barriers Debilitated state due to current medical condition    Self-management support Doctor's office    Other persons present Patient    Patient Concerns Nutrition/Meal planning    Special Needs None    Preferred Learning Style No preference indicated    Learning Readiness Ready    How often do you need to have someone help you when you read instructions, pamphlets, or other written materials from your doctor or pharmacy? 1 - Never      Pre-Education Assessment   Patient understands the diabetes disease and treatment process. Needs Review    Patient understands incorporating nutritional management into lifestyle. Needs Review    Patient undertands incorporating physical activity into lifestyle. Needs Review    Patient understands using medications safely. Needs Review    Patient understands monitoring blood glucose, interpreting and using results Needs Review    Patient understands prevention, detection, and treatment of acute complications.  Needs Review    Patient understands prevention, detection, and treatment of chronic complications. Needs Review    Patient understands how to develop strategies to address psychosocial issues. Needs Review    Patient understands how to develop strategies to promote health/change behavior. Needs Review      Complications   Last HgB A1C per patient/outside  source 12.7 %   08/16/2021   How often do you check your blood sugar? 0 times/day (not testing)      Dietary Intake   Breakfast none    Snack (morning) none    Lunch 2 [place] and cheese sandwiches on white bread "white is cheaper"    Snack (afternoon) Popcorn chips OR occasional Receese cups (4 regular size)    Dinner pork chops, 1- 1/2 cups butter and herb rice (dislikes vegetables)    Snack (evening) none    Beverage(s) diet Mt. Dew, Zero Pepsi, no water (dislikes)      Exercise   Exercise Type ADL's      Patient Education   Previous Diabetes Education Yes (please comment)   04/2021   Nutrition management  Food label reading, portion sizes and measuring food.;Meal options for control of blood glucose level and chronic complications.    Physical activity and exercise  Role of exercise on diabetes management, blood pressure control and cardiac health.    Medications Reviewed patients medication for diabetes, action, purpose, timing of dose and side effects.    Monitoring Taught/discussed recording of test results and interpretation of SMBG.;Identified appropriate SMBG and/or A1C goals.    Acute complications Other (comment)    Psychosocial adjustment Worked with patient to identify barriers to care and solutions      Individualized Goals (developed by patient)   Nutrition General guidelines for healthy choices and portions discussed    Physical Activity Exercise 3-5 times per week;30 minutes per day    Medications take my medication as prescribed    Monitoring  test my blood glucose as discussed    Reducing Risk examine blood glucose patterns;increase portions of healthy fats      Patient Self-Evaluation of Goals - Patient rates self as meeting previously set goals (% of time)   Nutrition >75%    Physical Activity < 25%    Medications 50 - 75 %    Monitoring < 25%    Problem Solving 25 - 50%    Reducing Risk 25 - 50%    Health Coping 50 - 75 %      Post-Education Assessment    Patient understands the diabetes disease and treatment process. Needs Review    Patient understands incorporating nutritional management into lifestyle. Needs Review    Patient undertands incorporating physical activity into lifestyle. Needs Review    Patient understands using medications safely. Needs Review    Patient understands monitoring blood glucose, interpreting and using results Needs Review    Patient understands prevention, detection, and treatment of acute complications. Needs Review    Patient understands prevention, detection, and treatment of chronic complications. Needs Review    Patient understands how to develop strategies to address psychosocial issues. Needs Review    Patient understands how to develop strategies to promote health/change behavior. Needs Review      Outcomes   Expected Outcomes Demonstrated interest in learning. Expect positive outcomes    Future DMSE 2 months    Program Status Completed      Subsequent Visit   Since your last visit have you continued or begun  to take your medications as prescribed? No    Since your last visit have you experienced any weight changes? Gain    Weight Gain (lbs) 13    Since your last visit, are you checking your blood glucose at least once a day? No             Individualized Plan for Diabetes Self-Management Training:   Learning Objective:  Patient will have a greater understanding of diabetes self-management. Patient education plan is to attend individual and/or group sessions per assessed needs and concerns.   Plan:   Patient Instructions  Restart the Trulicity unless your MD changes this. Continue to take your insulin and other medications as prescribed. Rotate where you give your insulin injection. Get fresh air and sunlight daily.   Great job changing your drinks!  Recommend starting at the gym.  Exercise can lower your blood glucose and improve your mental health.  Begin testing your blood  sugar.  Goals:  Fasting  80-130  2 hours after you eat - less than 180  Expected Outcomes:  Demonstrated interest in learning. Expect positive outcomes  Education material provided:   If problems or questions, patient to contact team via:  Phone  Future DSME appointment: 2 months

## 2021-08-19 ENCOUNTER — Other Ambulatory Visit: Payer: Self-pay

## 2021-08-19 MED FILL — Dulaglutide Soln Auto-injector 3 MG/0.5ML: SUBCUTANEOUS | 28 days supply | Qty: 2 | Fill #0 | Status: AC

## 2021-08-20 ENCOUNTER — Other Ambulatory Visit: Payer: Self-pay

## 2021-09-02 ENCOUNTER — Other Ambulatory Visit: Payer: Self-pay

## 2021-09-02 ENCOUNTER — Ambulatory Visit: Payer: Medicare Other | Attending: Family Medicine | Admitting: Pharmacist

## 2021-09-02 DIAGNOSIS — I2699 Other pulmonary embolism without acute cor pulmonale: Secondary | ICD-10-CM

## 2021-09-02 DIAGNOSIS — I825Y2 Chronic embolism and thrombosis of unspecified deep veins of left proximal lower extremity: Secondary | ICD-10-CM

## 2021-09-02 LAB — POCT INR: INR: 2.6 (ref 2.0–3.0)

## 2021-09-02 MED ORDER — WARFARIN SODIUM 2.5 MG PO TABS
ORAL_TABLET | ORAL | 2 refills | Status: DC
Start: 2021-09-02 — End: 2021-12-19
  Filled 2021-09-02: qty 30, fill #0
  Filled 2021-09-19 – 2021-10-21 (×2): qty 30, 30d supply, fill #0
  Filled 2021-11-19: qty 30, 30d supply, fill #1

## 2021-09-04 ENCOUNTER — Other Ambulatory Visit: Payer: Self-pay | Admitting: Family Medicine

## 2021-09-04 DIAGNOSIS — E1149 Type 2 diabetes mellitus with other diabetic neurological complication: Secondary | ICD-10-CM

## 2021-09-04 NOTE — Telephone Encounter (Signed)
Requested medications are due for refill today.  yes  Requested medications are on the active medications list.  yes  Last refill. 08/05/2021  Future visit scheduled.   yes  Notes to clinic.  Medication not delegated.    Requested Prescriptions  Pending Prescriptions Disp Refills   traMADol (ULTRAM) 50 MG tablet 60 tablet 0    Sig: Take 1 tablet (50 mg total) by mouth every 12 (twelve) hours as needed.     Not Delegated - Analgesics:  Opioid Agonists Failed - 09/04/2021  8:48 PM      Failed - This refill cannot be delegated      Failed - Urine Drug Screen completed in last 360 days      Failed - Valid encounter within last 6 months    Recent Outpatient Visits           1 month ago Hypertriglyceridemia   [facility] [place], Odette Horns, MD   9 months ago Type 2 diabetes mellitus with hyperglycemia, with long-term current use of insulin (HCC)   [facility] [place], D[place]el [place], MD   1 year ago Type 2 diabetes mellitus with hyperglycemia, with long-term current use of insulin (HCC)   [facility] [place], Odette Horns, MD   1 year ago Type 2 diabetes mellitus with hyperglycemia, with long-term current use of insulin (HCC)    [facility] Hoy Register, MD   1 year ago Non-compliance   [facility] Hoy Register, MD       Future Appointments             In 1 month Hoy Register, MD [facility]

## 2021-09-05 ENCOUNTER — Other Ambulatory Visit: Payer: Self-pay

## 2021-09-06 ENCOUNTER — Other Ambulatory Visit: Payer: Self-pay

## 2021-09-06 MED ORDER — TRAMADOL HCL 50 MG PO TABS
50.0000 mg | ORAL_TABLET | Freq: Two times a day (BID) | ORAL | 0 refills | Status: DC | PRN
  Filled 2021-09-06: qty 60, 30d supply, fill #0

## 2021-09-17 ENCOUNTER — Other Ambulatory Visit: Payer: Self-pay

## 2021-09-17 ENCOUNTER — Inpatient Hospital Stay: Payer: Medicare Other | Attending: Physician Assistant

## 2021-09-17 ENCOUNTER — Inpatient Hospital Stay (HOSPITAL_BASED_OUTPATIENT_CLINIC_OR_DEPARTMENT_OTHER): Payer: Medicare Other | Admitting: Physician Assistant

## 2021-09-17 VITALS — BP 132/86 | HR 97 | Temp 97.5°F | Resp 18 | Wt 283.9 lb

## 2021-09-17 DIAGNOSIS — D751 Secondary polycythemia: Secondary | ICD-10-CM | POA: Insufficient documentation

## 2021-09-17 DIAGNOSIS — F17210 Nicotine dependence, cigarettes, uncomplicated: Secondary | ICD-10-CM | POA: Diagnosis not present

## 2021-09-17 DIAGNOSIS — D696 Thrombocytopenia, unspecified: Secondary | ICD-10-CM

## 2021-09-17 LAB — CBC WITH DIFFERENTIAL (CANCER CENTER ONLY)
Abs Immature Granulocytes: 0.02 10*3/uL (ref 0.00–0.07)
Basophils Absolute: 0.1 10*3/uL (ref 0.0–0.1)
Basophils Relative: 1 %
Eosinophils Absolute: 0.2 10*3/uL (ref 0.0–0.5)
Eosinophils Relative: 2 %
HCT: 49.7 % (ref 39.0–52.0)
Hemoglobin: 17.3 g/dL — ABNORMAL HIGH (ref 13.0–17.0)
Immature Granulocytes: 0 %
Lymphocytes Relative: 23 %
Lymphs Abs: 2.2 10*3/uL (ref 0.7–4.0)
MCH: 28.5 pg (ref 26.0–34.0)
MCHC: 34.8 g/dL (ref 30.0–36.0)
MCV: 82 fL (ref 80.0–100.0)
Monocytes Absolute: 0.5 10*3/uL (ref 0.1–1.0)
Monocytes Relative: 6 %
Neutro Abs: 6.6 10*3/uL (ref 1.7–7.7)
Neutrophils Relative %: 68 %
Platelet Count: 102 10*3/uL — ABNORMAL LOW (ref 150–400)
RBC: 6.06 MIL/uL — ABNORMAL HIGH (ref 4.22–5.81)
RDW: 13.3 % (ref 11.5–15.5)
WBC Count: 9.5 10*3/uL (ref 4.0–10.5)
nRBC: 0 % (ref 0.0–0.2)

## 2021-09-17 LAB — CMP (CANCER CENTER ONLY)
ALT: 22 U/L (ref 0–44)
AST: 16 U/L (ref 15–41)
Albumin: 4 g/dL (ref 3.5–5.0)
Alkaline Phosphatase: 78 U/L (ref 38–126)
Anion gap: 8 (ref 5–15)
BUN: 14 mg/dL (ref 6–20)
CO2: 29 mmol/L (ref 22–32)
Calcium: 9.3 mg/dL (ref 8.9–10.3)
Chloride: 99 mmol/L (ref 98–111)
Creatinine: 0.84 mg/dL (ref 0.61–1.24)
GFR, Estimated: >60 mL/min (ref >60)
Glucose, Bld: 385 mg/dL — ABNORMAL HIGH (ref 70–99)
Potassium: 4.3 mmol/L (ref 3.5–5.1)
Sodium: 136 mmol/L (ref 135–145)
Total Bilirubin: 1.1 mg/dL (ref 0.3–1.2)
Total Protein: 7 g/dL (ref 6.5–8.1)

## 2021-09-17 MED FILL — Dulaglutide Soln Auto-injector 3 MG/0.5ML: SUBCUTANEOUS | 28 days supply | Qty: 2 | Fill #1 | Status: CN

## 2021-09-17 NOTE — Progress Notes (Signed)
[place] [phone]   [phone]  PROGRESS NOTE  Patient Care Team: Charlott Rakes, MD as PCP - General (Family Medicine)  Hematological/Oncological History 1) 04/21/2016: Labs showed WBC 12.0 (H), RBC 6.09 (H), Hgb 17.3 (H), Plt 132K 2) 04/19/2020: Labs showed WBC 9.5, RBC 6.29 (H), Hgb 17.8 (H), Plt 111K 3) 12/06/2020: Labs showed WBC 9.1, RBC 6.76 (H), Hgb 18.9 (H), Plt 120K 4) 12/17/2020: Established care with Dede Query PA-C   CHIEF COMPLAINTS: Secondary polycythemia  HISTORY OF PRESENTING ILLNESS:  Bradley [name] 39 y.o. male who returns for a follow for secondary polycythemia and thrombocytopenia. He is unaccompanied for this visit. Patient reports that his energy levels and appetite are stable. He completes his ADLs on his own. He denies any nausea, vomiting or abdominal pain. He denies any changes to his bowel movements. He denies easy bruising or signs of bleeding. He has chronic SOB with exertion secondary to COPD. He denies any fevers, chills, night sweats, chest pain, cough, headaches, bleeding or easy bruising. He has no other complaints. Rest of 10 point ROS is below.   MEDICAL HISTORY:  Past Medical History:  Diagnosis Date   ADHD    ADHD (attention deficit hyperactivity disorder)    Bipolar 1 disorder (HCC)    Bipolar disorder ([place])    Diabetes mellitus without complication ([place])    Hyperlipidemia    Morbid obesity (HCC)    Obesity    Panic attack    Varicose veins of both lower extremities with pain     SURGICAL HISTORY: Past Surgical History:  Procedure Laterality Date   surgery on meatus as a child      SOCIAL HISTORY: Social History   Socioeconomic History   Marital status: Single    Spouse name: Not on file   Number of children: Not on file   Years of education: Not on file   Highest education level: Not on file  Occupational History   Not on file  Tobacco Use   Smoking status: Every Day     Packs/day: 1.00    Types: Cigarettes   Smokeless tobacco: Never  Vaping Use   Vaping Use: Never used  Substance and Sexual Activity   Alcohol use: Yes    Comment: Rarely   Drug use: No   Sexual activity: Not on file  Other Topics Concern   Not on file  Social History Narrative   ** Merged History Encounter **       Social Determinants of Health   Financial Resource Strain: Not on file  Food Insecurity: Not on file  Transportation Needs: Not on file  Physical Activity: Not on file  Stress: Not on file  Social Connections: Not on file  Intimate Partner Violence: Not on file    FAMILY HISTORY: Family History  Problem Relation Age of Onset   Rheum arthritis Mother    Diabetes Mother    Heart failure Mother    Stroke Mother    Glaucoma Maternal Grandmother    Lung cancer Paternal Grandmother        smoker    ALLERGIES:  is allergic to actos [pioglitazone], gabapentin, and lyrica [pregabalin].  MEDICATIONS:  Current Outpatient Medications  Medication Sig Dispense Refill   ARIPiprazole (ABILIFY) 5 MG tablet Take 1 tablet (5 mg total) by mouth at bedtime. 30 tablet 2   ARIPiprazole (ABILIFY) 5 MG tablet Take 1 tablet(s) by mouth at bedtime for mood 30 tablet 2   atorvastatin (  LIPITOR) 20 MG tablet Take 1 tablet by mouth daily. 30 tablet 3   Blood Glucose Monitoring Suppl (ONETOUCH VERIO) w/Device KIT Use to check blood sugar up to 3 times daily (E11.69, Z79.4) 1 kit 0   busPIRone (BUSPAR) 7.5 MG tablet Take 1 tablet(s) by mouth twice a day for severe anxiety/panic attacks (Patient taking differently: 7.5 mg. Takes as needed) 60 tablet 2   Continuous Blood Gluc Sensor (DEXCOM G6 SENSOR) MISC 1 Device by Does not apply route as directed. 9 each 3   Continuous Blood Gluc Transmit (DEXCOM G6 TRANSMITTER) MISC 1 Device by Does not apply route as directed. 1 each 3   doxepin (SINEQUAN) 50 MG capsule Take 1 capsule (50 mg total) by mouth as needed for sleep 30 capsule 2   doxepin  (SINEQUAN) 50 MG capsule Take 1 capsule(s) by mouth at bedtime as needed for sleep 30 capsule 2   Dulaglutide 3 MG/0.5ML SOPN INJECT 3 MG AS DIRECTED ONCE A WEEK. 6 mL 3   DULoxetine (CYMBALTA) 30 MG capsule Take 1 capsule (30 mg total) by mouth every morning. 30 capsule 2   DULoxetine (CYMBALTA) 30 MG capsule Take 1 capsule(s) by mouth daily for anxiety/depression 30 capsule 2   DULoxetine (CYMBALTA) 60 MG capsule Take 1 capsule(s) by mouth at bedtime for anxiety/depression 30 capsule 2   glucose blood (ONETOUCH VERIO) test strip 1 each by Other route 3 (three) times daily. Use to check blood sugar up to 3 times daily (E11.69, Z79.4) 300 each 3   insulin aspart (NOVOLOG FLEXPEN) 100 UNIT/ML FlexPen Inject 8 Units into the skin 3 (three) times daily with meals. 30 mL 3   insulin degludec (TRESIBA FLEXTOUCH) 100 UNIT/ML FlexTouch Pen Inject 35 Units into the skin daily. 30 mL 3   Insulin Pen Needle 31G X 5 MM MISC Use as directed 3 times daily. 300 each 3   Menthol 7.5 % PTCH Apply 1 patch topically as needed (pain).     OneTouch Delica Lancets 65V MISC 1 Device by Does not apply route 3 (three) times daily. 300 each 3   sildenafil (VIAGRA) 50 MG tablet Take 1 tablet (50 mg total) by mouth daily as needed for erectile dysfunction. At least 24 hours between doses 10 tablet 1   traMADol (ULTRAM) 50 MG tablet Take 1 tablet (50 mg total) by mouth every 12 (twelve) hours as needed. 60 tablet 0   warfarin (COUMADIN) 2.5 MG tablet TAKE BY MOUTH DAILY AS DIRECTED BY THE COUMADIN CLINIC. 30 tablet 2   albuterol (VENTOLIN HFA) 108 (90 Base) MCG/ACT inhaler INHALE 1-2 PUFFS INTO THE LUNGS EVERY 6 (SIX) HOURS AS NEEDED FOR WHEEZING OR SHORTNESS OF BREATH. 18 g 2   DULoxetine (CYMBALTA) 60 MG capsule Take 1 capsule (60 mg total) by mouth at bedtime. (Patient not taking: Reported on 08/16/2021) 30 capsule 2   No current facility-administered medications for this visit.    REVIEW OF SYSTEMS:   Constitutional: (  - ) fevers, ( - )  chills , ( - ) night sweats Eyes: ( - ) blurriness of vision, ( - ) double vision, ( - ) watery eyes Ears, nose, mouth, throat, and face: ( - ) mucositis, ( - ) sore throat Respiratory: ( - ) cough, ( + ) dyspnea, ( - ) wheezes Cardiovascular: ( - ) palpitation, ( - ) chest discomfort, ( + ) lower extremity swelling Gastrointestinal:  ( - ) nausea, ( - ) heartburn, ( - )  change in bowel habits Skin: ( - ) abnormal skin rashes Lymphatics: ( - ) new lymphadenopathy, ( - ) easy bruising Neurological: ( + ) numbness, ( - ) tingling, ( - ) new weaknesses Behavioral/Psych: ( - ) mood change, ( - ) new changes  All other systems were reviewed with the patient and are negative.  PHYSICAL EXAMINATION: ECOG PERFORMANCE STATUS: 1 - Symptomatic but completely ambulatory  Vitals:   09/17/21 1455  BP: 132/86  Pulse: 97  Resp: 18  Temp: (!) 97.5 F (36.4 C)  SpO2: 100%   Filed Weights   09/17/21 1455  Weight: 283 lb 14.4 oz (128.8 kg)    GENERAL: well appearing male in NAD  SKIN: texture, turgor are normal, no rashes or significant lesions. Venous stasis dermatitis in lower extremities EYES: conjunctiva are pink and non-injected, sclera clear  OROPHARYNX: no exudate, no erythema; lips, buccal mucosa, and tongue normal. Severe teeth decay. NECK: supple, non-tender LYMPH:  no palpable lymphadenopathy in the cervical, axillary or supraclavicular lymph nodes.  LUNGS: Bilateral, diffuse wheezing upon auscultation HEART:  Regular rhythm and no murmurs. Lower extremity edema (L > R) ABDOMEN: soft, non-tender, non-distended, normal bowel sounds Musculoskeletal: no cyanosis of digits and no clubbing  PSYCH: alert & oriented x 3, fluent speech NEURO: no focal motor/sensory deficits  LABORATORY DATA:  I have reviewed the data as listed CBC Latest Ref Rng & Units 09/17/2021 03/19/2021 12/17/2020  WBC 4.0 - 10.5 K/uL 9.5 9.3 9.3  Hemoglobin 13.0 - 17.0 g/dL 17.3(H) 17.5(H) 16.7   Hematocrit 39.0 - 52.0 % 49.7 50.7 49.8  Platelets 150 - 400 K/uL 102(L) 102(L) 110(L)    CMP Latest Ref Rng & Units 03/19/2021 12/06/2020 04/19/2020  Glucose 70 - 99 mg/dL 481(H) 398(H) 445(H)  BUN 6 - 20 mg/dL 11 7 <5(L)  Creatinine 0.61 - 1.24 mg/dL 0.99 0.78 0.81  Sodium 135 - 145 mmol/L 133(L) 140 133(L)  Potassium 3.5 - 5.1 mmol/L 4.4 4.7 4.4  Chloride 98 - 111 mmol/L 97(L) 97 94(L)  CO2 22 - 32 mmol/L 27 - 28  Calcium 8.9 - 10.3 mg/dL 9.0 9.7 10.0  Total Protein 6.5 - 8.1 g/dL 7.3 7.6 7.6  Total Bilirubin 0.3 - 1.2 mg/dL 0.6 0.5 0.7  Alkaline Phos 38 - 126 U/L 83 110 124  AST 15 - 41 U/L $Remo'20 31 29  'mCoSX$ ALT 0 - 44 U/L 33 - 46(H)    ASSESSMENT & PLAN Bradley [name] is a 40 y.o. male presenting to the clinic for evaluation for secondary polycythemia  #Polycythemia 2/2 smoking --MPN panel and BCR/ABL was negative.Likely etiology is smoking.  --Labs today reveal Hgb of 17.3, stable --Recommend smoking cessation. Continue to monitor.  --RTC in 6 months with labs.   #Chronic smoking --Patient currently smokes 1 pack/day. He reports that he plans to completely quit in next month with a friend.  --Sent referral to tobacco cessation program at last visit but patient has not enrolled in program. I will send another referral.   #History of DVT and PE: --05/22/2016: DVT of left lower extremity involving common femoral, femoral,popliteal and saphenofemoral veins. Treated with xarelto for one year.  --11/27/2018: Bilateral PE. Patient was treated prior with doxycycline for pneumonia. Treated PE with lovenox >> coumadin.  --His family history is remarkable for his mother who had a DVT in her 11s.  --Risk factors include venous insufficiency, smoking, and sedentary lifestyle. --Patient will remain on lifelong anticoagulation due to risk factors mentioned above, multiple  DVT/PE episodes, and family history of DVT.   #Thrombocytopenia:  --Likely etiology is possible hepatic cirrhosis and  splenomegaly as mentioned on CT scan from 11/27/2018.  --Patient denies any bleeding or easy bruising --Today's platelet count has mildly decreased to 102K. Monitor for now. --If levels worsen, consider repeat imaging to further evaluate.   #Hyperglycemia: --Today's glucose level is 385, chronically elevated. Patient has T2DM currently on insulin, glipizide, trulicity.  --Continue to monitor.    No orders of the defined types were placed in this encounter.   All questions were answered. The patient knows to call the clinic with any problems, questions or concerns.  I have spent a total of 25 minutes minutes of face-to-face and non-face-to-face time, preparing to see the patient, obtaining and/or reviewing separately obtained history, performing a medically appropriate examination, counseling and educating the patient, ordering tests, referring and communicating with other health care professionals, documenting clinical information in the electronic health record, and care coordination.    Dede Query, PA-C Department of Hematology/Oncology [place] at [facility] Phone: [phone]

## 2021-09-18 ENCOUNTER — Telehealth: Payer: Self-pay | Admitting: Physician Assistant

## 2021-09-18 ENCOUNTER — Other Ambulatory Visit: Payer: Self-pay

## 2021-09-18 NOTE — Telephone Encounter (Signed)
Scheduled per 12/20 los, message was left with pt

## 2021-09-19 ENCOUNTER — Other Ambulatory Visit: Payer: Self-pay

## 2021-09-24 ENCOUNTER — Other Ambulatory Visit: Payer: Self-pay

## 2021-09-25 ENCOUNTER — Telehealth: Payer: Self-pay | Admitting: Internal Medicine

## 2021-09-25 ENCOUNTER — Other Ambulatory Visit: Payer: Self-pay

## 2021-09-25 DIAGNOSIS — Z794 Long term (current) use of insulin: Secondary | ICD-10-CM

## 2021-09-25 MED ORDER — DULAGLUTIDE 3 MG/0.5ML ~~LOC~~ SOAJ
SUBCUTANEOUS | 3 refills | Status: DC
  Filled 2021-09-25: qty 6, fill #0

## 2021-09-25 NOTE — Telephone Encounter (Signed)
..  MEDICATION: Dulaglutide 3 MG/0.5ML SOPN  PHARMACY:   [facility] Phone:  [phone]  Fax:  [phone]      HAS THE PATIENT CONTACTED THEIR PHARMACY?  yes  Phone:  [phone]  Fax:  [phone]    IS THIS A 90 DAY SUPPLY : 30 days  IS PATIENT OUT OF MEDICATION: yes  IF NOT; HOW MUCH IS LEFT:   LAST APPOINTMENT DATE: @11 /18/2022  NEXT APPOINTMENT DATE:@3 /23/2023  DO WE HAVE YOUR PERMISSION TO LEAVE A DETAILED MESSAGE?:  OTHER COMMENTS: Pharmacy is unable to fill due to back order with no estimate on when medication will be available. Patient ask for alternate if possible. Patient has been for 3 days now.   **Let patient know to contact pharmacy at the end of the day to make sure medication is ready. **  ** Please notify patient to allow 48-72 hours to process**  **Encourage patient to contact the pharmacy for refills or they can request refills through [facility]**

## 2021-09-25 NOTE — Telephone Encounter (Signed)
Script sent  

## 2021-09-27 MED ORDER — TRULICITY 1.5 MG/0.5ML ~~LOC~~ SOAJ
3.0000 mg | SUBCUTANEOUS | 2 refills | Status: DC
Start: 2021-09-27 — End: 2021-12-19
  Filled 2021-09-27: qty 2, 28d supply, fill #0

## 2021-09-27 NOTE — Addendum Note (Signed)
Addended by: Kenyon Ana on: 09/27/2021 04:44 PM   Modules accepted: Orders

## 2021-09-27 NOTE — Telephone Encounter (Signed)
New dose sent to preferred pharmacy.

## 2021-09-27 NOTE — Telephone Encounter (Signed)
Patient is out of Trulicity 3MG  weekly - pharmacy can not fill RX because they do not have any and the product is on back order. Note from 09/25/21 was resolved by resending same out of stock RX back to pharmacy.  Patient is requesting a substitute medication until Trulicity is back in stock.  Is requesting to speak to someone if there is someone available. Patient a bit "panicked" by note having any medication going into a long holiday weekend   Call back number [phone]

## 2021-09-30 ENCOUNTER — Other Ambulatory Visit: Payer: Self-pay

## 2021-10-01 ENCOUNTER — Other Ambulatory Visit: Payer: Self-pay

## 2021-10-03 ENCOUNTER — Other Ambulatory Visit: Payer: Self-pay

## 2021-10-03 ENCOUNTER — Ambulatory Visit: Payer: Medicare Other | Attending: Family Medicine | Admitting: Pharmacist

## 2021-10-03 DIAGNOSIS — I2699 Other pulmonary embolism without acute cor pulmonale: Secondary | ICD-10-CM | POA: Diagnosis not present

## 2021-10-03 DIAGNOSIS — I825Y2 Chronic embolism and thrombosis of unspecified deep veins of left proximal lower extremity: Secondary | ICD-10-CM

## 2021-10-03 LAB — POCT INR: INR: 2.7 (ref 2.0–3.0)

## 2021-10-07 ENCOUNTER — Other Ambulatory Visit: Payer: Self-pay

## 2021-10-07 ENCOUNTER — Other Ambulatory Visit: Payer: Self-pay | Admitting: Family Medicine

## 2021-10-07 DIAGNOSIS — E1149 Type 2 diabetes mellitus with other diabetic neurological complication: Secondary | ICD-10-CM

## 2021-10-07 DIAGNOSIS — E781 Pure hyperglyceridemia: Secondary | ICD-10-CM

## 2021-10-07 NOTE — Telephone Encounter (Signed)
Refilled 11/7/20222 #30 with 3 refills. Requested Prescriptions  Pending Prescriptions Disp Refills   atorvastatin (LIPITOR) 20 MG tablet 30 tablet 3    Sig: Take 1 tablet by mouth daily.     Cardiovascular:  Antilipid - Statins Failed - 10/07/2021 10:39 AM      Failed - Triglycerides in normal range and within 360 days    Triglycerides  Date Value Ref Range Status  12/06/2020 236 (H) 0 - 149 mg/dL Final         Passed - Total Cholesterol in normal range and within 360 days    Cholesterol, Total  Date Value Ref Range Status  12/06/2020 185 100 - 199 mg/dL Final         Passed - LDL in normal range and within 360 days    LDL Chol Calc (NIH)  Date Value Ref Range Status  12/06/2020 98 0 - 99 mg/dL Final         Passed - HDL in normal range and within 360 days    HDL  Date Value Ref Range Status  12/06/2020 47 >39 mg/dL Final         Passed - Patient is not pregnant      Passed - Valid encounter within last 12 months    Recent Outpatient Visits          2 months ago Hypertriglyceridemia   [facility] [place], [place], MD   10 months ago Type 2 diabetes mellitus with hyperglycemia, with long-term current use of insulin (HCC)   [facility] [place], [place], MD   1 year ago Type 2 diabetes mellitus with hyperglycemia, with long-term current use of insulin (HCC)   [facility] [place], [place], MD   1 year ago Type 2 diabetes mellitus with hyperglycemia, with long-term current use of insulin (HCC)   [facility] Hoy Register, MD   1 year ago Non-compliance   [facility] Hoy Register, MD      Future Appointments            In 2 weeks Hoy Register, MD [facility]

## 2021-10-08 ENCOUNTER — Other Ambulatory Visit: Payer: Self-pay | Admitting: Pharmacist

## 2021-10-08 ENCOUNTER — Other Ambulatory Visit: Payer: Self-pay

## 2021-10-08 DIAGNOSIS — E1149 Type 2 diabetes mellitus with other diabetic neurological complication: Secondary | ICD-10-CM

## 2021-10-08 MED ORDER — TRAMADOL HCL 50 MG PO TABS
50.0000 mg | ORAL_TABLET | Freq: Two times a day (BID) | ORAL | 0 refills | Status: DC | PRN
  Filled 2021-10-08: qty 60, 30d supply, fill #0

## 2021-10-09 ENCOUNTER — Other Ambulatory Visit: Payer: Self-pay

## 2021-10-10 ENCOUNTER — Other Ambulatory Visit: Payer: Self-pay | Admitting: Internal Medicine

## 2021-10-10 ENCOUNTER — Other Ambulatory Visit: Payer: Self-pay

## 2021-10-10 DIAGNOSIS — Z794 Long term (current) use of insulin: Secondary | ICD-10-CM

## 2021-10-10 DIAGNOSIS — E1165 Type 2 diabetes mellitus with hyperglycemia: Secondary | ICD-10-CM

## 2021-10-11 ENCOUNTER — Other Ambulatory Visit: Payer: Self-pay

## 2021-10-16 ENCOUNTER — Other Ambulatory Visit: Payer: Self-pay

## 2021-10-21 ENCOUNTER — Ambulatory Visit: Payer: Medicare Other | Admitting: Podiatry

## 2021-10-21 ENCOUNTER — Other Ambulatory Visit: Payer: Self-pay

## 2021-10-21 ENCOUNTER — Ambulatory Visit: Payer: Medicare Other | Attending: Family Medicine | Admitting: Family Medicine

## 2021-10-21 ENCOUNTER — Encounter: Payer: Self-pay | Admitting: Family Medicine

## 2021-10-21 VITALS — BP 113/79 | HR 100 | Ht 74.0 in | Wt 278.0 lb

## 2021-10-21 DIAGNOSIS — E1149 Type 2 diabetes mellitus with other diabetic neurological complication: Secondary | ICD-10-CM

## 2021-10-21 DIAGNOSIS — G8929 Other chronic pain: Secondary | ICD-10-CM

## 2021-10-21 DIAGNOSIS — I825Y2 Chronic embolism and thrombosis of unspecified deep veins of left proximal lower extremity: Secondary | ICD-10-CM | POA: Diagnosis not present

## 2021-10-21 MED ORDER — TRAMADOL HCL 50 MG PO TABS
50.0000 mg | ORAL_TABLET | Freq: Two times a day (BID) | ORAL | 2 refills | Status: DC | PRN
  Filled 2021-10-21 – 2021-11-06 (×2): qty 60, 30d supply, fill #0
  Filled 2021-12-04: qty 60, 30d supply, fill #1
  Filled 2022-01-02: qty 60, 30d supply, fill #2

## 2021-10-21 NOTE — Progress Notes (Signed)
Subjective:  Patient ID: Bradley [name], male    DOB: [birth year]  Age: 41 y.o. MRN: [id number]  CC: Leg Pain   HPI Bradley [name] is a 41 y.o. year old male with a history of morbid obesity, type 2 diabetes mellitus (A1c 12.7 managed by endocrine, non compliance,diabetic neuropathy (unable to tolerate gabapentin, Lyrica), Bipolar disorder, previous DVT in 2017, PE (diagnosed in 10/2018 - on chronic anticoagulation with Coumadin).  Interval History: His last endocrinology visit was on 08/16/2021 with Dr Kelton Pillar. Last month he had a visit with oncology for follow-up of thrombocytopenia and per notes likely etiology is possible hepatic cirrhosis. He has no bleeding. He states his last alcoholic drink was 3 months ago.  He is on tramadol chronically for diabetic neuropathy in his legs predominantly due to inability to tolerate gabapentin and Lyrica and symptoms are not controlled on Cymbalta. Uses Tramadol 1-2 x/day. Denies use of recreational drugs He still smokes 3/4ths of a pack of cig/day. For his PE and h/o DVT he is doing well on Coumadin which is managed bu the Pharm D and last INR was 2.7.  Past Medical History:  Diagnosis Date   ADHD    ADHD (attention deficit hyperactivity disorder)    Bipolar 1 disorder (HCC)    Bipolar disorder ([place])    Diabetes mellitus without complication ([place])    Hyperlipidemia    Morbid obesity ([place])    Obesity    Panic attack    Varicose veins of both lower extremities with pain     Past Surgical History:  Procedure Laterality Date   surgery on meatus as a child      Family History  Problem Relation Age of Onset   Rheum arthritis Mother    Diabetes Mother    Heart failure Mother    Stroke Mother    Glaucoma Maternal Grandmother    Lung cancer Paternal Grandmother        smoker    Allergies  Allergen Reactions   Actos [Pioglitazone] Other (See Comments)    Stomach cramps   Gabapentin Other (See Comments)    Crying spells    Lyrica [Pregabalin] Other (See Comments)    Makes the patient somnolent    Outpatient Medications Prior to Visit  Medication Sig Dispense Refill   ARIPiprazole (ABILIFY) 5 MG tablet Take 1 tablet (5 mg total) by mouth at bedtime. 30 tablet 2   ARIPiprazole (ABILIFY) 5 MG tablet Take 1 tablet(s) by mouth at bedtime for mood 30 tablet 2   atorvastatin (LIPITOR) 20 MG tablet Take 1 tablet by mouth daily. 30 tablet 3   Blood Glucose Monitoring Suppl (ONETOUCH VERIO) w/Device KIT Use to check blood sugar up to 3 times daily (E11.69, Z79.4) 1 kit 0   busPIRone (BUSPAR) 7.5 MG tablet Take 1 tablet(s) by mouth twice a day for severe anxiety/panic attacks (Patient taking differently: 7.5 mg. Takes as needed) 60 tablet 2   Continuous Blood Gluc Sensor (DEXCOM G6 SENSOR) MISC 1 Device by Does not apply route as directed. 9 each 3   Continuous Blood Gluc Transmit (DEXCOM G6 TRANSMITTER) MISC 1 Device by Does not apply route as directed. 1 each 3   doxepin (SINEQUAN) 50 MG capsule Take 1 capsule (50 mg total) by mouth as needed for sleep 30 capsule 2   doxepin (SINEQUAN) 50 MG capsule Take 1 capsule(s) by mouth at bedtime as needed for sleep 30 capsule 2   Dulaglutide (TRULICITY) 1.5 IR/5.1OA SOPN Inject  3 mg into the skin once a week. 4 mL 2   Dulaglutide 3 MG/0.5ML SOPN INJECT 3 MG AS DIRECTED ONCE A WEEK. 6 mL 3   DULoxetine (CYMBALTA) 30 MG capsule Take 1 capsule (30 mg total) by mouth every morning. 30 capsule 2   DULoxetine (CYMBALTA) 30 MG capsule Take 1 capsule(s) by mouth daily for anxiety/depression 30 capsule 2   DULoxetine (CYMBALTA) 60 MG capsule Take 1 capsule(s) by mouth at bedtime for anxiety/depression 30 capsule 2   glucose blood (ONETOUCH VERIO) test strip 1 each by Other route 3 (three) times daily. Use to check blood sugar up to 3 times daily (E11.69, Z79.4) 300 each 3   insulin aspart (NOVOLOG FLEXPEN) 100 UNIT/ML FlexPen Inject 8 Units into the skin 3 (three) times daily with meals. 30  mL 3   insulin degludec (TRESIBA FLEXTOUCH) 100 UNIT/ML FlexTouch Pen Inject 35 Units into the skin daily. 30 mL 3   Insulin Pen Needle 31G X 5 MM MISC Use as directed 3 times daily. 300 each 3   Menthol 7.5 % PTCH Apply 1 patch topically as needed (pain).     OneTouch Delica Lancets 21J MISC 1 Device by Does not apply route 3 (three) times daily. 300 each 3   sildenafil (VIAGRA) 50 MG tablet Take 1 tablet (50 mg total) by mouth daily as needed for erectile dysfunction. At least 24 hours between doses 10 tablet 1   traMADol (ULTRAM) 50 MG tablet Take 1 tablet (50 mg total) by mouth every 12 (twelve) hours as needed. 60 tablet 0   warfarin (COUMADIN) 2.5 MG tablet TAKE BY MOUTH DAILY AS DIRECTED BY THE COUMADIN CLINIC. 30 tablet 2   albuterol (VENTOLIN HFA) 108 (90 Base) MCG/ACT inhaler INHALE 1-2 PUFFS INTO THE LUNGS EVERY 6 (SIX) HOURS AS NEEDED FOR WHEEZING OR SHORTNESS OF BREATH. 18 g 2   No facility-administered medications prior to visit.     ROS Review of Systems  Constitutional:  Negative for activity change and appetite change.  HENT:  Negative for sinus pressure and sore throat.   Eyes:  Negative for visual disturbance.  Respiratory:  Negative for cough, chest tightness and shortness of breath.   Cardiovascular:  Negative for chest pain and leg swelling.  Gastrointestinal:  Negative for abdominal distention, abdominal pain, constipation and diarrhea.  Endocrine: Negative.   Genitourinary:  Negative for dysuria.  Musculoskeletal:  Negative for joint swelling and myalgias.  Skin:  Negative for rash.  Allergic/Immunologic: Negative.   Neurological:  Positive for numbness. Negative for weakness and light-headedness.  Psychiatric/Behavioral:  Negative for dysphoric mood and suicidal ideas.    Objective:  BP 113/79    Pulse 100    Ht $R'6\' 2"'LJ$  (1.88 m)    Wt 278 lb (126.1 kg)    SpO2 99%    BMI 35.69 kg/m   BP/Weight 10/21/2021 09/17/2021 94/17/4081  Systolic BP 448 185 -  Diastolic  BP 79 86 -  Wt. (Lbs) 278 283.9 282  BMI 35.69 36.45 36.21  Some encounter information is confidential and restricted. Go to Review Flowsheets activity to see all data.      Physical Exam Constitutional:      Appearance: He is well-developed. He is obese.  Cardiovascular:     Rate and Rhythm: Normal rate.     Heart sounds: Normal heart sounds. No murmur heard. Pulmonary:     Effort: Pulmonary effort is normal.     Breath sounds: Normal breath sounds. No  wheezing or rales.  Chest:     Chest wall: No tenderness.  Abdominal:     General: Bowel sounds are normal. There is no distension.     Palpations: Abdomen is soft. There is no mass.     Tenderness: There is no abdominal tenderness.  Musculoskeletal:        General: Normal range of motion.     Right lower leg: No edema.     Left lower leg: No edema.  Skin:    Comments: Hyperpigmentation in lower half of L leg  Neurological:     Mental Status: He is alert and oriented to person, place, and time.  Psychiatric:        Mood and Affect: Mood normal.    CMP Latest Ref Rng & Units 09/17/2021 03/19/2021 12/06/2020  Glucose 70 - 99 mg/dL 385(H) 481(H) 398(H)  BUN 6 - 20 mg/dL $Remove'14 11 7  'TFlCcXd$ Creatinine 0.61 - 1.24 mg/dL 0.84 0.99 0.78  Sodium 135 - 145 mmol/L 136 133(L) 140  Potassium 3.5 - 5.1 mmol/L 4.3 4.4 4.7  Chloride 98 - 111 mmol/L 99 97(L) 97  CO2 22 - 32 mmol/L 29 27 -  Calcium 8.9 - 10.3 mg/dL 9.3 9.0 9.7  Total Protein 6.5 - 8.1 g/dL 7.0 7.3 7.6  Total Bilirubin 0.3 - 1.2 mg/dL 1.1 0.6 0.5  Alkaline Phos 38 - 126 U/L 78 83 110  AST 15 - 41 U/L $Remo'16 20 31  'bQbiM$ ALT 0 - 44 U/L 22 33 -    Lipid Panel     Component Value Date/Time   CHOL 185 12/06/2020 1456   TRIG 236 (H) 12/06/2020 1456   HDL 47 12/06/2020 1456   CHOLHDL 3.9 12/06/2020 1456   CHOLHDL 4.0 11/12/2016 1613   VLDL 28 11/12/2016 1613   LDLCALC 98 12/06/2020 1456    CBC    Component Value Date/Time   WBC 9.5 09/17/2021 1440   WBC 9.5 04/19/2020 2357   RBC  6.06 (H) 09/17/2021 1440   HGB 17.3 (H) 09/17/2021 1440   HGB 18.9 (H) 12/06/2020 1456   HCT 49.7 09/17/2021 1440   HCT 57.0 (H) 12/06/2020 1456   PLT 102 (L) 09/17/2021 1440   PLT 120 (L) 12/06/2020 1456   MCV 82.0 09/17/2021 1440   MCV 84 12/06/2020 1456   MCH 28.5 09/17/2021 1440   MCHC 34.8 09/17/2021 1440   RDW 13.3 09/17/2021 1440   RDW 13.1 12/06/2020 1456   LYMPHSABS 2.2 09/17/2021 1440   LYMPHSABS 2.0 12/06/2020 1456   MONOABS 0.5 09/17/2021 1440   EOSABS 0.2 09/17/2021 1440   EOSABS 0.1 12/06/2020 1456   BASOSABS 0.1 09/17/2021 1440   BASOSABS 0.1 12/06/2020 1456    Lab Results  Component Value Date   HGBA1C 12.7 (A) 08/16/2021    Assessment & Plan:  1. Other diabetic neurological complication associated with type 2 diabetes mellitus ([place]) Uncontrolled Diabetes with A1c of 12.7 Goal is <7.0 Currently on Diabetic regimen per Endocrine Neuropathy is stable on current regimen of Tramadol due to intolerance of Gabapentin, Lyrica Counseled on Diabetic diet, my plate method, 938 minutes of moderate intensity exercise/week Blood sugar logs with fasting goals of 80-120 mg/dl, random of less than 180 and in the event of sugars less than 60 mg/dl or greater than 400 mg/dl encouraged to notify the clinic. Advised on the need for annual eye exams, annual foot exams, Pneumonia vaccine.  - Microalbumin / creatinine urine ratio; Future - traMADol (ULTRAM) 50 MG tablet;  Take 1 tablet (50 mg total) by mouth every 12 (twelve) hours as needed.  Dispense: 60 tablet; Refill: 2 - Microalbumin / creatinine urine ratio  2. Chronic deep vein thrombosis (DVT) of proximal vein of left lower extremity (HCC) Therapeutic on Coumadin Will remain on lifelong anticoagulation due to multiple thrombotic events  3. Other chronic pain Currently on a pain contract PDMP reviewed - no aberrant behavior - Drug Screen 12+Alcohol+CRT, Ur    No orders of the defined types were placed in this  encounter.   Return in about 6 months (around 04/20/2022) for Chronic medical conditions.       Charlott Rakes, MD, FAAFP. [facility] and [place], [place]   10/21/2021, 4:04 PM

## 2021-10-22 ENCOUNTER — Encounter: Payer: Self-pay | Admitting: Family Medicine

## 2021-10-22 LAB — MICROALBUMIN / CREATININE URINE RATIO
Creatinine, Urine: 54.5 mg/dL
Microalb/Creat Ratio: 13 mg/g creat (ref 0–29)
Microalbumin, Urine: 6.9 ug/mL

## 2021-10-24 ENCOUNTER — Encounter: Payer: Medicare Other | Attending: Internal Medicine | Admitting: Dietician

## 2021-10-24 ENCOUNTER — Other Ambulatory Visit: Payer: Self-pay

## 2021-10-24 ENCOUNTER — Encounter: Payer: Self-pay | Admitting: Dietician

## 2021-10-24 DIAGNOSIS — Z794 Long term (current) use of insulin: Secondary | ICD-10-CM | POA: Insufficient documentation

## 2021-10-24 DIAGNOSIS — E1165 Type 2 diabetes mellitus with hyperglycemia: Secondary | ICD-10-CM | POA: Diagnosis not present

## 2021-10-24 LAB — DRUG SCREEN 12+ALCOHOL+CRT, UR
Amphetamines, Urine: NEGATIVE ng/mL
BENZODIAZ UR QL: NEGATIVE ng/mL
Barbiturate: NEGATIVE ng/mL
Cannabinoids: NEGATIVE ng/mL
Cocaine (Metabolite): NEGATIVE ng/mL
Creatinine, Urine: 55.4 mg/dL (ref 20.0–300.0)
Ethanol, Urine: NEGATIVE %
Meperidine: NEGATIVE ng/mL
Methadone: NEGATIVE ng/mL
OPIATE SCREEN URINE: NEGATIVE ng/mL
Oxycodone/Oxymorphone, Urine: NEGATIVE ng/mL
Phencyclidine: NEGATIVE ng/mL
Propoxyphene: NEGATIVE ng/mL
Tramadol: POSITIVE — AB

## 2021-10-24 NOTE — Patient Instructions (Addendum)
Take your Novolog out the the refrigerator and put on your entertainment center.  Remember to take this prior to each meal.  Continue to take your medications consistently.  Start at the gym on Monday - recumbent bike for 10 minutes and slowly increase, try the ropes or the rowing machine  Begin checking your blood sugar.  Fill prescription for strips.

## 2021-10-24 NOTE — Progress Notes (Signed)
Patient is here today with his girlfriend.  They were last seen by this RD on 08/16/2021. Their aunt gifted them an air fryer.  He is not frying his foods and has not tried the air fryer yet. He notes no difficulty getting his medication recently. Continues to smoke and does not want to quit but decreased from 1 pack per day to 3/4 pack per day due to expense. Has not gotten his testing strips filled due to expense.  Blood glucose was 366 today in office (fasting).  Encouraged him to get strips and begin testing. Eats only 1 meal and small snacks throughout the day.   He is not exercising and encouraged him to start to go to the [employer] with his girlfriend. He continues to slowly lose weight.   Diabetes Self-Management Education  Visit Type: Follow-up  Appt. Start Time: 1440 Appt. End Time: 1525  10/24/2021  [name], identified by name and date of birth, is a 41 y.o. male with a diagnosis of Diabetes:  .   ASSESSMENT  History includes Type 2 Diabetes (2019), DVT, Bipolar, ADHD, neuropathy, smoking, memory issues, polycythemia, thrombocytopenia, history of DVT/PE Labs noted to include:  A1C 12.7% 08/16/2021, 13.6% 08/09/2020 increased from 13.2% 05/03/2020, cholesterol 193, Triglycerides 365, HDL 40, LDL 92 08/09/2020 Medications include:  Trulicity, glipizide (discontinued), actos (discontinued), Tresiba 35 units q HS, Novolog before meals (not taking), coumadin, Abilify and other medications that contribute to weight gain. He stopped pioglitizone after 3 days due to stomach cramps. He could not tolerated Metformin due to bowel incontinence.    Sleep:  Eats late (10pm - midnight).  Goes to sleep late.  Wakes about 10:30 am. Support:  [facility], patient of Dr. Lonzo [name] at [facility], PCP Bradley Register, MD   Weight: 279 lbs 10/24/2021 282 lbs 08/16/2021 loss likely due to uncontrolled blood glucose 295 lbs 02/14/21 306  lbs 47/2022 306 lbs 11/23/2020 425 lbs highest adult weight 2 years ago (loss presumably due to uncontrolled blood sugar) Lowest adult weight 235 lbs about 8 years ago and he does not recall circumstances of increased weight gain.   Patient lives with his girlfriend.  He does the shopping and cooking. He is on disability.  Finances are a concern. His mother passed away 12/10/2020 from complications of diabetes. He is planning on quitting smoking and has a support system of people that are willing to quit with him. Girlfriend  goes to the gym 2-3 times per week [facility]).  Weight 279 lb (126.6 kg). Body mass index is 35.82 kg/m.   Diabetes Self-Management Education - 10/24/21 1700       Visit Information   Visit Type Follow-up      Pre-Education Assessment   Patient understands the diabetes disease and treatment process. Needs Review    Patient understands incorporating nutritional management into lifestyle. Needs Review    Patient undertands incorporating physical activity into lifestyle. Needs Review    Patient understands using medications safely. Needs Review    Patient understands monitoring blood glucose, interpreting and using results Needs Review    Patient understands prevention, detection, and treatment of acute complications. Needs Review    Patient understands prevention, detection, and treatment of chronic complications. Needs Review    Patient understands how to develop strategies to address psychosocial issues. Needs Review    Patient understands how to develop strategies to promote health/change behavior. Needs Review      Complications   How often do  you check your blood sugar? 0 times/day (not testing)      Dietary Intake   Breakfast none    Snack (morning) none    Lunch 1-2 slices pizza    Snack (afternoon) chips or dry honey nut cheerios    Dinner none    Snack (evening) none    Beverage(s) diet soda, 2% milk, NO WATER (dislikes)      Exercise   Exercise Type  ADL's      Patient Education   Nutrition management  Role of diet in the treatment of diabetes and the relationship between the three main macronutrients and blood glucose level;Meal options for control of blood glucose level and chronic complications.    Physical activity and exercise  Other (comment)   encouraged   Medications Reviewed patients medication for diabetes, action, purpose, timing of dose and side effects.    Monitoring Purpose and frequency of SMBG.    Acute complications Taught treatment of hypoglycemia - the 15 rule.    Psychosocial adjustment Worked with patient to identify barriers to care and solutions      Individualized Goals (developed by patient)   Nutrition General guidelines for healthy choices and portions discussed    Physical Activity Exercise 3-5 times per week;15 minutes per day    Medications take my medication as prescribed    Monitoring  test my blood glucose as discussed    Reducing Risk examine blood glucose patterns;do foot checks daily;stop smoking;increase portions of healthy fats      Patient Self-Evaluation of Goals - Patient rates self as meeting previously set goals (% of time)   Nutrition 50 - 75 %    Physical Activity < 25%    Medications >75%    Monitoring < 25%    Problem Solving 25 - 50%    Reducing Risk 25 - 50%    Health Coping 50 - 75 %      Post-Education Assessment   Patient understands the diabetes disease and treatment process. Needs Review    Patient understands incorporating nutritional management into lifestyle. Needs Review    Patient undertands incorporating physical activity into lifestyle. Needs Review    Patient understands using medications safely. Needs Review    Patient understands monitoring blood glucose, interpreting and using results Needs Review    Patient understands prevention, detection, and treatment of acute complications. Needs Review    Patient understands prevention, detection, and treatment of chronic  complications. Needs Review    Patient understands how to develop strategies to address psychosocial issues. Needs Review    Patient understands how to develop strategies to promote health/change behavior. Needs Review      Outcomes   Expected Outcomes Demonstrated interest in learning. Expect positive outcomes    Future DMSE 2 months    Program Status Not Completed      Subsequent Visit   Since your last visit have you experienced any weight changes? Loss    Weight Loss (lbs) 3             Individualized Plan for Diabetes Self-Management Training:   Learning Objective:  Patient will have a greater understanding of diabetes self-management. Patient education plan is to attend individual and/or group sessions per assessed needs and concerns.   Plan:   Patient Instructions  Take your Novolog out the the refrigerator and put on your entertainment center.  Remember to take this prior to each meal.  Continue to take your medications consistently.  Start at the  gym on Monday - recumbent bike for 10 minutes and slowly increase, try the ropes or the rowing machine  Begin checking your blood sugar.  Fill prescription for strips.    Expected Outcomes:  Demonstrated interest in learning. Expect positive outcomes  Education material provided:   If problems or questions, patient to contact team via:  Phone  Future DSME appointment: 2 months

## 2021-10-28 DIAGNOSIS — F3132 Bipolar disorder, current episode depressed, moderate: Secondary | ICD-10-CM | POA: Diagnosis not present

## 2021-10-28 DIAGNOSIS — F411 Generalized anxiety disorder: Secondary | ICD-10-CM | POA: Diagnosis not present

## 2021-10-28 DIAGNOSIS — F4312 Post-traumatic stress disorder, chronic: Secondary | ICD-10-CM | POA: Diagnosis not present

## 2021-11-04 ENCOUNTER — Other Ambulatory Visit: Payer: Self-pay

## 2021-11-04 ENCOUNTER — Ambulatory Visit: Payer: Medicare Other | Attending: Family Medicine | Admitting: Pharmacist

## 2021-11-04 DIAGNOSIS — I2699 Other pulmonary embolism without acute cor pulmonale: Secondary | ICD-10-CM

## 2021-11-04 DIAGNOSIS — I825Y2 Chronic embolism and thrombosis of unspecified deep veins of left proximal lower extremity: Secondary | ICD-10-CM

## 2021-11-04 LAB — POCT INR: INR: 2.5 (ref 2.0–3.0)

## 2021-11-05 ENCOUNTER — Other Ambulatory Visit: Payer: Self-pay

## 2021-11-06 ENCOUNTER — Other Ambulatory Visit: Payer: Self-pay

## 2021-11-19 ENCOUNTER — Other Ambulatory Visit: Payer: Self-pay

## 2021-11-19 ENCOUNTER — Encounter: Payer: Self-pay | Admitting: Podiatry

## 2021-11-19 ENCOUNTER — Ambulatory Visit (INDEPENDENT_AMBULATORY_CARE_PROVIDER_SITE_OTHER): Payer: Medicare Other | Admitting: Podiatry

## 2021-11-19 DIAGNOSIS — B351 Tinea unguium: Secondary | ICD-10-CM

## 2021-11-19 DIAGNOSIS — L608 Other nail disorders: Secondary | ICD-10-CM | POA: Diagnosis not present

## 2021-11-19 DIAGNOSIS — M79674 Pain in right toe(s): Secondary | ICD-10-CM | POA: Diagnosis not present

## 2021-11-19 DIAGNOSIS — M79675 Pain in left toe(s): Secondary | ICD-10-CM | POA: Diagnosis not present

## 2021-11-19 DIAGNOSIS — L03031 Cellulitis of right toe: Secondary | ICD-10-CM

## 2021-11-19 DIAGNOSIS — E119 Type 2 diabetes mellitus without complications: Secondary | ICD-10-CM | POA: Diagnosis not present

## 2021-11-19 DIAGNOSIS — E1142 Type 2 diabetes mellitus with diabetic polyneuropathy: Secondary | ICD-10-CM

## 2021-11-19 MED ORDER — CEPHALEXIN 500 MG PO CAPS
500.0000 mg | ORAL_CAPSULE | Freq: Four times a day (QID) | ORAL | 0 refills | Status: AC
  Filled 2021-11-19: qty 40, 10d supply, fill #0

## 2021-11-19 MED ORDER — MUPIROCIN 2 % EX OINT
TOPICAL_OINTMENT | CUTANEOUS | 1 refills | Status: DC
  Filled 2021-11-19: qty 22, 14d supply, fill #0

## 2021-11-19 NOTE — Patient Instructions (Signed)
Apply Mupirocin Ointment to right great toe once daily.

## 2021-11-20 ENCOUNTER — Other Ambulatory Visit: Payer: Self-pay

## 2021-11-24 NOTE — Progress Notes (Signed)
ANNUAL DIABETIC FOOT EXAM  Subjective: Bradley [name] presents today for annual diabetic foot examination.  Patient relates diagnosis of diabetes.  Patient denies any h/o foot wounds.  Patient has been diagnosed with neuropathy and it is managed with  Tramadol .  Patient's blood sugar was 336 mg/dl today. States last A1c was 12.7%. He is seeing Dr. Kelton Pillar in Endocrinology  Risk factors:  chronic DVT, varicose veins of both LE, pulmonary emboli, venous stasis dermatitis, smoking addiction, diabetes, diabetic neuropathy, HTN, hyperlipidemia, patient compliance.  Patient states he never picked up antibiotics from pharmacy prescribed on last visit, nor did he apply any antibiotic ointment to the right great toe. He states it wasn't painful, but he is neuropathic. He denies any redness, drainage or swelling of right great toe. Denies any fever, chills, night sweats, nausea or vomiting.  Bradley Rakes, MD is patient's PCP. Last visit was October 21, 2021.  Past Medical History:  Diagnosis Date   ADHD    ADHD (attention deficit hyperactivity disorder)    Bipolar 1 disorder ([place])    Bipolar disorder ([place])    Diabetes mellitus without complication ([place])    Hyperlipidemia    Morbid obesity ([place])    Obesity    Panic attack    Varicose veins of both lower extremities with pain    Patient Active Problem List   Diagnosis Date Noted   Hyperglycemia 03/19/2021   Polycythemia 12/17/2020   Hypertriglyceridemia 10/18/2020   Pain due to onychomycosis of toenails of both feet 09/28/2019   Diabetes mellitus without complication ([place]) 78/29/5621   Venous stasis dermatitis of left lower extremity 09/28/2019   Pulmonary emboli ([place]) 11/27/2018   Acute pulmonary embolism without acute cor pulmonale, unspecified pulmonary embolism type ([place]) 11/27/2018   Splenomegaly 11/27/2018   Focal nodular hyperplasia of liver 11/27/2018   Diabetic neuropathy ([place]) 05/12/2018   Chronic pain syndrome  05/20/2017   Bipolar disorder, current episode mixed, moderate ([place]) 03/04/2017   Varicose veins of both lower extremities with pain 02/18/2017   Chronic deep vein thrombosis (DVT) of proximal vein of left lower extremity ([place]) 11/12/2016   Smoking addiction 06/19/2016   HTN (hypertension) 07/27/2013   Morbid obesity ([place]) 07/27/2013   Bipolar 1 disorder, mixed, moderate ([place]) 07/27/2013   DM2 (diabetes mellitus, type 2) ([place]) 04/26/2013   Past Surgical History:  Procedure Laterality Date   surgery on meatus as a child     Current Outpatient Medications on File Prior to Visit  Medication Sig Dispense Refill   albuterol (VENTOLIN HFA) 108 (90 Base) MCG/ACT inhaler INHALE 1-2 PUFFS INTO THE LUNGS EVERY 6 (SIX) HOURS AS NEEDED FOR WHEEZING OR SHORTNESS OF BREATH. 18 g 2   ARIPiprazole (ABILIFY) 5 MG tablet Take 1 tablet (5 mg total) by mouth at bedtime. 30 tablet 2   ARIPiprazole (ABILIFY) 5 MG tablet Take 1 tablet(s) by mouth at bedtime for mood 30 tablet 2   atorvastatin (LIPITOR) 20 MG tablet Take 1 tablet by mouth daily. 30 tablet 3   Blood Glucose Monitoring Suppl (ONETOUCH VERIO) w/Device KIT Use to check blood sugar up to 3 times daily (E11.69, Z79.4) 1 kit 0   busPIRone (BUSPAR) 7.5 MG tablet Take 1 tablet(s) by mouth twice a day for severe anxiety/panic attacks (Patient taking differently: 7.5 mg. Takes as needed) 60 tablet 2   Continuous Blood Gluc Sensor (DEXCOM G6 SENSOR) MISC 1 Device by Does not apply route as directed. 9 each 3   Continuous Blood Gluc Transmit (  DEXCOM G6 TRANSMITTER) MISC 1 Device by Does not apply route as directed. 1 each 3   doxepin (SINEQUAN) 50 MG capsule Take 1 capsule (50 mg total) by mouth as needed for sleep 30 capsule 2   doxepin (SINEQUAN) 50 MG capsule Take 1 capsule(s) by mouth at bedtime as needed for sleep 30 capsule 2   Dulaglutide (TRULICITY) 1.5 FU/9.3AT SOPN Inject 3 mg into the skin once a week. 4 mL 2   Dulaglutide 3 MG/0.5ML SOPN INJECT 3  MG AS DIRECTED ONCE A WEEK. 6 mL 3   DULoxetine (CYMBALTA) 30 MG capsule Take 1 capsule (30 mg total) by mouth every morning. 30 capsule 2   DULoxetine (CYMBALTA) 30 MG capsule Take 1 capsule(s) by mouth daily for anxiety/depression 30 capsule 2   DULoxetine (CYMBALTA) 60 MG capsule Take 1 capsule(s) by mouth at bedtime for anxiety/depression 30 capsule 2   glucose blood (ONETOUCH VERIO) test strip 1 each by Other route 3 (three) times daily. Use to check blood sugar up to 3 times daily (E11.69, Z79.4) 300 each 3   insulin aspart (NOVOLOG FLEXPEN) 100 UNIT/ML FlexPen Inject 8 Units into the skin 3 (three) times daily with meals. 30 mL 3   insulin degludec (TRESIBA FLEXTOUCH) 100 UNIT/ML FlexTouch Pen Inject 35 Units into the skin daily. 30 mL 3   Insulin Pen Needle 31G X 5 MM MISC Use as directed 3 times daily. 300 each 3   Menthol 7.5 % PTCH Apply 1 patch topically as needed (pain).     OneTouch Delica Lancets 55D MISC 1 Device by Does not apply route 3 (three) times daily. 300 each 3   sildenafil (VIAGRA) 50 MG tablet Take 1 tablet (50 mg total) by mouth daily as needed for erectile dysfunction. At least 24 hours between doses 10 tablet 1   traMADol (ULTRAM) 50 MG tablet Take 1 tablet (50 mg total) by mouth every 12 (twelve) hours as needed. 60 tablet 2   warfarin (COUMADIN) 2.5 MG tablet TAKE BY MOUTH DAILY AS DIRECTED BY THE COUMADIN CLINIC. 30 tablet 2   No current facility-administered medications on file prior to visit.    Allergies  Allergen Reactions   Actos [Pioglitazone] Other (See Comments)    Stomach cramps   Gabapentin Other (See Comments)    Crying spells   Lyrica [Pregabalin] Other (See Comments)    Makes the patient somnolent   Social History   Occupational History   Not on file  Tobacco Use   Smoking status: Every Day    Packs/day: 1.00    Types: Cigarettes   Smokeless tobacco: Never  Vaping Use   Vaping Use: Never used  Substance and Sexual Activity   Alcohol  use: Yes    Comment: Rarely   Drug use: No   Sexual activity: Not on file   Family History  Problem Relation Age of Onset   Rheum arthritis Mother    Diabetes Mother    Heart failure Mother    Stroke Mother    Glaucoma Maternal Grandmother    Lung cancer Paternal Grandmother        smoker   Immunization History  Administered Date(s) Administered   Influenza,inj,Quad PF,6+ Mos 06/22/2019   Pneumococcal Polysaccharide-23 08/12/2018     Review of Systems: Negative except as noted in the HPI.   Objective: There were no vitals filed for this visit.  Bradley [name] is a pleasant 41 y.o. male in NAD. AAO X 3.  Vascular  Examination: Capillary refill time to digits immediate b/l. Palpable DP pulse(s) b/l LE. Palpable PT pulse(s) b/l LE. Pedal hair absent. No pain with calf compression b/l. Lower extremity skin temperature gradient within normal limits. Hyperpigmentation noted LLE. Evidence of chronic venous insufficiency b/l lower extremities.  Dermatological Examination: Toenails 2-5 bilaterally elongated, discolored, dystrophic, thickened, and crumbly with subungual debris and tenderness to dorsal palpation. Pincer nail deformity L hallux. No erythema, no edema, no drainage, no fluctuance. Nail border hypertrophy absent.  Sign(s) of infection: no clinical signs of infection noted on examination today.. Localized abscess noted latera border(s) R hallux extending subungually with onycholysis of lateral half of nail plate. There is no penetration into deep tissues. There is also subungual detritus. There is erythema of proximal nail fold. No odor. No tracking nor tunneling.  Neurological Examination: Protective sensation diminished with 10g monofilament b/l. Vibratory sensation diminished b/l.  Musculoskeletal Examination: Normal muscle strength 5/5 to all lower extremity muscle groups bilaterally. No pain, crepitus or joint limitation noted with ROM b/l LE. No gross bony pedal  deformities b/l. Patient ambulates independently without assistive aids.  Footwear Assessment: Does the patient wear appropriate shoes? Wears flip flops. Does the patient need inserts/orthotics? No.  Hemoglobin A1C Latest Ref Rng & Units 08/16/2021 04/15/2021 01/09/2021  HGBA1C 4.0 - 5.6 % 12.7(A) 12.7(A) 11.3(A)  Some recent data might be hidden   Assessment: 1. Pain due to onychomycosis of toenails of both feet   2. Cellulitis of great toe, right   3. Subungual abscess of toe of right foot   4. Pincer nail deformity   5. Diabetic peripheral neuropathy associated with type 2 diabetes mellitus ([place])   6. Encounter for diabetic foot exam ([place])     ADA Risk Categorization: High Risk  Patient has one or more of the following: Loss of protective sensation Absent pedal pulses Severe Foot deformity History of foot ulcer  Plan: -Examined patient. -Diabetic foot examination performed today. -Continue foot and shoe inspections daily. Monitor blood glucose per PCP/Endocrinologist's recommendations. -Mycotic toenails 2-5 bilaterally and L hallux were debrided in length and girth with sterile nail nippers and dremel without iatrogenic bleeding. -Patient is neuropathic and loose portion of nailplate R hallux gently debrided to level of adherence. Abscess irrigated with hydrogen peroxide. Nailbed irrigated with alcohol.Triple antibiotic ointment applied to nailbed followed by light dressing. Patient instructed to apply Mupirocin Ointment to R hallux once daily. -Rx sent to patient's pharmacy of choice for Keflex 500 mg po qid x 10 days.  -We discussed importance of patient compliance in order to obtain favorable outcome. He states he is unable to soak his feet due to plumbing problems this week. I have prescribed Mupirocin Ointment for him to apply to the nailbed. I will have him follow up with Dr. Sherryle Lis on next week for toe check. -Patient/POA to call should there be question/concern in the  interim. Return in about 1 week (around 11/26/2021).  Marzetta Board, DPM

## 2021-11-28 ENCOUNTER — Other Ambulatory Visit: Payer: Self-pay

## 2021-11-28 ENCOUNTER — Ambulatory Visit (INDEPENDENT_AMBULATORY_CARE_PROVIDER_SITE_OTHER): Payer: Medicare Other | Admitting: Podiatry

## 2021-11-28 DIAGNOSIS — L03031 Cellulitis of right toe: Secondary | ICD-10-CM | POA: Diagnosis not present

## 2021-11-28 NOTE — Progress Notes (Signed)
?  Subjective:  ?Patient ID: Bradley [name], male    DOB: [birth year],  MRN: [id number] ? ?Chief Complaint  ?Patient presents with  ? Foot Ulcer  ?   follow up cellulitis/abscess of right hallux; Keflex and Mupirocin Ointment  ? ? ?41 y.o. male presents with the above complaint. History confirmed with patient.  At last visit with Dr. Elisha Ponder there was an abscess under the nail plate that was debrided and drained, he has completed the course of Keflex and has been using the mupirocin ointment as directed ? ?Objective:  ?Physical Exam: ?warm, good capillary refill, no trophic changes or ulcerative lesions, and gross peripheral neuropathy, pulses are palpable, he has significant hemosiderosis and edema secondary to venous disease, right hallux nail appears to be healing well no active signs of ulceration nailbed is intact, no cellulitis purulence or malodor. ? ?Assessment:  ? ?1. Cellulitis of great toe, right   ? ? ? ?Plan:  ?Patient was evaluated and treated and all questions answered. ? ?Doing well cellulitis has resolved at this point.  Begin to leave the toe open to air can continue mupirocin ointment for 1 more week and then discontinue.  No further oral antibiotics indicated.  Follow-up with me as needed for this or other issues.  He has routine at risk diabetic foot care scheduled already with Dr. Elisha Ponder. ? ?Return if symptoms worsen or fail to improve.  ? ?

## 2021-12-02 ENCOUNTER — Other Ambulatory Visit (HOSPITAL_COMMUNITY): Payer: Self-pay

## 2021-12-04 ENCOUNTER — Other Ambulatory Visit: Payer: Self-pay | Admitting: Family Medicine

## 2021-12-04 ENCOUNTER — Other Ambulatory Visit: Payer: Self-pay

## 2021-12-04 DIAGNOSIS — E1149 Type 2 diabetes mellitus with other diabetic neurological complication: Secondary | ICD-10-CM

## 2021-12-04 DIAGNOSIS — E781 Pure hyperglyceridemia: Secondary | ICD-10-CM

## 2021-12-04 MED ORDER — ATORVASTATIN CALCIUM 20 MG PO TABS
ORAL_TABLET | Freq: Every day | ORAL | 3 refills | Status: DC
  Filled 2021-12-04: qty 30, 30d supply, fill #0
  Filled 2022-01-02: qty 30, 30d supply, fill #1
  Filled 2022-02-04: qty 30, 30d supply, fill #2
  Filled 2022-03-05: qty 30, 30d supply, fill #3

## 2021-12-06 ENCOUNTER — Other Ambulatory Visit: Payer: Self-pay

## 2021-12-09 ENCOUNTER — Other Ambulatory Visit: Payer: Self-pay

## 2021-12-09 ENCOUNTER — Ambulatory Visit: Payer: Medicare Other | Attending: Family Medicine | Admitting: Pharmacist

## 2021-12-09 DIAGNOSIS — I825Y2 Chronic embolism and thrombosis of unspecified deep veins of left proximal lower extremity: Secondary | ICD-10-CM

## 2021-12-09 DIAGNOSIS — I2699 Other pulmonary embolism without acute cor pulmonale: Secondary | ICD-10-CM

## 2021-12-09 LAB — POCT INR: INR: 2.5 (ref 2.0–3.0)

## 2021-12-19 ENCOUNTER — Encounter: Payer: Medicare Other | Attending: Internal Medicine | Admitting: Dietician

## 2021-12-19 ENCOUNTER — Other Ambulatory Visit: Payer: Self-pay

## 2021-12-19 ENCOUNTER — Ambulatory Visit (INDEPENDENT_AMBULATORY_CARE_PROVIDER_SITE_OTHER): Payer: Medicare Other | Admitting: Internal Medicine

## 2021-12-19 ENCOUNTER — Other Ambulatory Visit: Payer: Self-pay | Admitting: Family Medicine

## 2021-12-19 ENCOUNTER — Encounter: Payer: Self-pay | Admitting: Internal Medicine

## 2021-12-19 ENCOUNTER — Encounter: Payer: Self-pay | Admitting: Dietician

## 2021-12-19 VITALS — BP 110/68 | HR 102 | Ht 74.0 in | Wt 279.0 lb

## 2021-12-19 DIAGNOSIS — Z794 Long term (current) use of insulin: Secondary | ICD-10-CM

## 2021-12-19 DIAGNOSIS — E1169 Type 2 diabetes mellitus with other specified complication: Secondary | ICD-10-CM

## 2021-12-19 DIAGNOSIS — R739 Hyperglycemia, unspecified: Secondary | ICD-10-CM

## 2021-12-19 DIAGNOSIS — E1165 Type 2 diabetes mellitus with hyperglycemia: Secondary | ICD-10-CM

## 2021-12-19 LAB — POCT GLYCOSYLATED HEMOGLOBIN (HGB A1C): Hemoglobin A1C: 12.7 % — AB (ref 4.0–5.6)

## 2021-12-19 LAB — POCT GLUCOSE (DEVICE FOR HOME USE): Glucose Fasting, POC: 275 mg/dL — AB (ref 70–99)

## 2021-12-19 MED ORDER — TRESIBA FLEXTOUCH 100 UNIT/ML ~~LOC~~ SOPN
45.0000 [IU] | PEN_INJECTOR | Freq: Every day | SUBCUTANEOUS | 3 refills | Status: DC
  Filled 2021-12-19: qty 45, 100d supply, fill #0

## 2021-12-19 MED ORDER — EMPAGLIFLOZIN 10 MG PO TABS
10.0000 mg | ORAL_TABLET | Freq: Every day | ORAL | 2 refills | Status: DC
  Filled 2021-12-19: qty 90, 90d supply, fill #0
  Filled 2022-03-13: qty 90, 90d supply, fill #1

## 2021-12-19 NOTE — Patient Instructions (Addendum)
Take the Novolog out of the refrigerator and put it in a place that would help you remember to take this before each meals. ? ?Bake rather than fry and use very little butter. ? ?Walk the dog every day for 15-30 minutes  ?Start back to the gym.  "Monday" ? ?Check your blood sugar as recommended. ? ?

## 2021-12-19 NOTE — Progress Notes (Signed)
?Name: Bradley [name]  ?Age/ Sex: 41 y.o., male   ?MRN/ DOB: [id number], 1981-04-26    ? ?PCP: Charlott Rakes, MD   ?Reason for Endocrinology Evaluation: Type 2 Diabetes Mellitus  ?Initial Endocrine Consultative Visit: 10/18/2020  ? ? ?PATIENT IDENTIFIER: [name] is a 41 y.o. male with a past medical history of T2Dm, DVT and bipolar disorder. The patient has followed with Endocrinology clinic since 10/18/2020 for consultative assistance with management of his diabetes. ? ?DIABETIC HISTORY:  ?Bradley [name] was diagnosed with DM yrs ago. Basaglar made him feel sluggish. His hemoglobin A1c has ranged from 7.7% in 2019, peaking at 14.9% in 2021. ? ? ?On his initial visit to our clinic his A1c was 13.6 %, he DECLINED insulin. He was on Glipizide and Trulicity, we increased Glipizide, Trulicity and added Pioglitazone. By the time he went  To see our RD in 12/2020 he was off pioglitazone  Due to abdominal cramps  ? ? ?Stopped Glipizide and started basal insulin 12/2020 ? ?Lives with girlfriend  ?  ?He is disabled due to mental health ? ? ? ? ? ?SUBJECTIVE:  ? ?During the last visit (08/16/2021): 54.0 % Continued Trulicity,  tresiba , started prandial insulin  ? ? ?Today (12/19/2021): Bradley [name] is here for a follow up on diabetes management. .   He has not been checking his glucose at home.   ? ? ? ?Denies abdomina pain or nausea  ?Saw podiatry 2/21,223 ? ?He drank  diet  [place]  ?He does not take trulicity  ?He forgets to take Novolog  ? ?HOME DIABETES REGIMEN:  ?Trulicity  3 mg weekly  ?Tresiba 35 units daily ?Novolog 8 units TIDQAC  ? ? ? ?Statin: no ?ACE-I/ARB: no ?Prior Diabetic Education: yes ? ? ?METER DOWNLOAD SUMMARY: Did not bring ? ? ? ?DIABETIC COMPLICATIONS: ?Microvascular complications:  ?neuropathy ?Denies: CKD retinopathy ?Last Eye Exam: Completed 05/21/2021 ? ?Macrovascular complications:  ? ?Denies: CAD, CVA, PVD ? ? ?HISTORY:  ?Past Medical History:  ?Past Medical History:   ?Diagnosis Date  ? ADHD   ? ADHD (attention deficit hyperactivity disorder)   ? Bipolar 1 disorder ([place])   ? Bipolar disorder ([place])   ? Diabetes mellitus without complication ([place])   ? Hyperlipidemia   ? Morbid obesity ([place])   ? Obesity   ? Panic attack   ? Varicose veins of both lower extremities with pain   ? ?Past Surgical History:  ?Past Surgical History:  ?Procedure Laterality Date  ? surgery on meatus as a child    ? ?Social History:  reports that he has been smoking cigarettes. He has been smoking an average of 1 pack per day. He has never used smokeless tobacco. He reports current alcohol use. He reports that he does not use drugs. ?Family History:  ?Family History  ?Problem Relation Age of Onset  ? Rheum arthritis Mother   ? Diabetes Mother   ? Heart failure Mother   ? Stroke Mother   ? Glaucoma Maternal Grandmother   ? Lung cancer Paternal Grandmother   ?     smoker  ? ? ? ?HOME MEDICATIONS: ?Allergies as of 12/19/2021   ? ?   Reactions  ? Actos [pioglitazone] Other (See Comments)  ? Stomach cramps  ? Gabapentin Other (See Comments)  ? Crying spells  ? Lyrica [pregabalin] Other (See Comments)  ? Makes the patient somnolent  ? ?  ? ?  ?Medication List  ?  ? ?  ?  Accurate as of December 19, 2021  3:19 PM. If you have any questions, ask your nurse or doctor.  ?  ?  ? ?  ? ?albuterol 108 (90 Base) MCG/ACT inhaler ?Commonly known as: VENTOLIN HFA ?INHALE 1-2 PUFFS INTO THE LUNGS EVERY 6 (SIX) HOURS AS NEEDED FOR WHEEZING OR SHORTNESS OF BREATH. ?  ?ARIPiprazole 5 MG tablet ?Commonly known as: ABILIFY ?Take 1 tablet (5 mg total) by mouth at bedtime. ?  ?ARIPiprazole 5 MG tablet ?Commonly known as: Abilify ?Take 1 tablet(s) by mouth at bedtime for mood ?  ?atorvastatin 20 MG tablet ?Commonly known as: LIPITOR ?Take 1 tablet by mouth daily. ?  ?B-D UF III MINI PEN NEEDLES 31G X 5 MM Misc ?Generic drug: Insulin Pen Needle ?Use as directed 3 times daily. ?  ?busPIRone 7.5 MG tablet ?Commonly known as: BUSPAR ?Take 1  tablet(s) by mouth twice a day for severe anxiety/panic attacks ?What changed:  ?how much to take ?additional instructions ?  ?Dexcom G6 Sensor Misc ?1 Device by Does not apply route as directed. ?  ?Dexcom G6 Transmitter Misc ?1 Device by Does not apply route as directed. ?  ?doxepin 50 MG capsule ?Commonly known as: SINEQUAN ?Take 1 capsule (50 mg total) by mouth as needed for sleep ?  ?doxepin 50 MG capsule ?Commonly known as: SINEQUAN ?Take 1 capsule(s) by mouth at bedtime as needed for sleep ?  ?Dulaglutide 3 MG/0.5ML Sopn ?INJECT 3 MG AS DIRECTED ONCE A WEEK. ?  ?Trulicity 1.5 MG/0.5ML Sopn ?Generic drug: Dulaglutide ?Inject 3 mg into the skin once a week. ?  ?DULoxetine 30 MG capsule ?Commonly known as: CYMBALTA ?Take 1 capsule (30 mg total) by mouth every morning. ?  ?DULoxetine 30 MG capsule ?Commonly known as: CYMBALTA ?Take 1 capsule(s) by mouth daily for anxiety/depression ?  ?DULoxetine 60 MG capsule ?Commonly known as: CYMBALTA ?Take 1 capsule(s) by mouth at bedtime for anxiety/depression ?  ?Menthol 7.5 % Ptch ?Apply 1 patch topically as needed (pain). ?  ?mupirocin ointment 2 % ?Commonly known as: BACTROBAN ?Apply to right great toe once daily. ?  ?NovoLOG FlexPen 100 UNIT/ML FlexPen ?Generic drug: insulin aspart ?Inject 8 Units into the skin 3 (three) times daily with meals. ?  ?OneTouch Delica Lancets 33G Misc ?1 Device by Does not apply route 3 (three) times daily. ?  ?OneTouch Verio test strip ?Generic drug: glucose blood ?1 each by Other route 3 (three) times daily. Use to check blood sugar up to 3 times daily (E11.69, Z79.4) ?  ?OneTouch Verio w/Device Kit ?Use to check blood sugar up to 3 times daily (E11.69, Z79.4) ?  ?sildenafil 50 MG tablet ?Commonly known as: Viagra ?Take 1 tablet (50 mg total) by mouth daily as needed for erectile dysfunction. At least 24 hours between doses ?  ?traMADol 50 MG tablet ?Commonly known as: ULTRAM ?Take 1 tablet (50 mg total) by mouth every 12 (twelve) hours  as needed. ?  ?Evaristo Bury FlexTouch 100 UNIT/ML FlexTouch Pen ?Generic drug: insulin degludec ?Inject 35 Units into the skin daily. ?  ?warfarin 2.5 MG tablet ?Commonly known as: COUMADIN ?Take as directed by the anticoagulation clinic. If you are unsure how to take this medication, talk to your nurse or doctor. ?Original instructions: TAKE BY MOUTH DAILY AS DIRECTED BY THE COUMADIN CLINIC. ?  ? ?  ? ? ? ?OBJECTIVE:  ? ?Vital Signs: BP 110/68 (BP Location: Left Arm, Patient Position: Sitting, Cuff Size: Small)   Pulse (!) 102  Ht '6\' 2"'$  (1.88 m)   Wt 279 lb (126.6 kg)   SpO2 99%   BMI 35.82 kg/m?   ?Wt Readings from Last 3 Encounters:  ?12/19/21 279 lb (126.6 kg)  ?10/24/21 279 lb (126.6 kg)  ?10/21/21 278 lb (126.1 kg)  ? ? ? ?Exam: ?General: Pt appears well and is in NAD  ?Lungs: Clear with good BS bilat with no rales, rhonchi, or wheezes  ?Heart: RRR with normal S1 and S2 and no gallops; no murmurs; no rub  ?Abdomen: Normoactive bowel sounds, soft, nontender, without masses or organomegaly palpable  ?Extremities: Trace pretibial edema.  Stasis dermatitis over the shins  ?Neuro: MS is good with appropriate affect, pt is alert and Ox3  ? ?  DM foot exam: 12/19/2021 ?  ?The skin of the feet is without sores or ulcerations, had a right great toe discoloration  ?The pedal pulses are 2+ on right and 2+ on left. ?The sensation is intact to a screening 5.07, 10 gram monofilament bilaterally ? ? ? ?DATA REVIEWED: ? ?Lab Results  ?Component Value Date  ? HGBA1C 12.7 (A) 08/16/2021  ? HGBA1C 12.7 (A) 04/15/2021  ? HGBA1C 11.3 (A) 01/09/2021  ? ? ? Latest Reference Range & Units 09/17/21 14:40  ?Sodium 135 - 145 mmol/L 136  ?Potassium 3.5 - 5.1 mmol/L 4.3  ?Chloride 98 - 111 mmol/L 99  ?CO2 22 - 32 mmol/L 29  ?Glucose 70 - 99 mg/dL 385 (H)  ?BUN 6 - 20 mg/dL 14  ?Creatinine 0.61 - 1.24 mg/dL 0.84  ?Calcium 8.9 - 10.3 mg/dL 9.3  ?Anion gap 5 - 15  8  ?Alkaline Phosphatase 38 - 126 U/L 78  ?Albumin 3.5 - 5.0 g/dL 4.0  ?AST 15  - 41 U/L 16  ?ALT 0 - 44 U/L 22  ?Total Protein 6.5 - 8.1 g/dL 7.0  ?Total Bilirubin 0.3 - 1.2 mg/dL 1.1  ?GFR, Est Non African American >60 mL/min >60  ? ? ?ASSESSMENT / PLAN / RECOMMENDATIONS:  ? ?1) Type 2 Diabete

## 2021-12-19 NOTE — Progress Notes (Signed)
? ?Diabetes Self-Management Education ? ?Visit Type: Follow-up ? ?Appt. Start Time: 1610 Appt. End Time: [id number] ? ?12/19/2021 ? ?[name], identified by name and date of birth, is a 41 y.o. male with a diagnosis of Diabetes:  .  ? ?ASSESSMENT ? ?Bradley [name] is here today with his girlfriend.  He was last seen by this RD 10/24/2021. ? ?Was not taking his Novolog as he was forgetting as it was stored in the refrigerator. ?Was not taking the Trulicity due to expense.  This has been discontinued ?He is to start [place] and the [place] dose has been increased to 45 units daily. ?He is not checking his blood glucose as strips cost $45 per 25 strips.  He states that he will get the strips refilled next week when he gets paid. ?A box with 10 strips for the One Touch meter were provided as well as an Accu Check Guide me meter (Lot [id number], expiration 12/15/2021).  Request for strips prescription sent to MD.  AccuCheck appears to be better covered by his insurance. ?He was able to demonstrate use of the AccuCheck meter and his glucose was 311. ?He reports that usually his glucose is 350-400. ? ?Their elderly dog passed away at the end of Nov 24, 2022 and he states that he has not been eating as much and he stopped caring about his own care.  Has since gotten a new dog and things have now returned more to normal.  The new dog has helped him be more active.  He has not been taking her for walks but have encouraged this. ?Smoking about 3/4 of a pack per day and no interest in stopping at this time.  ?Drinks no water as he dislikes and overall only drinks diet soda. ? ?History includes Type 2 Diabetes (2019), DVT, Bipolar, ADHD, neuropathy, smoking, memory issues, polycythemia, thrombocytopenia, history of DVT/PE ?Labs noted to include:  A1C 12.7% 08/16/2021 and 12/19/2021, 13.6% 08/09/2020 increased from 13.2% 05/03/2020, cholesterol 193, Triglycerides 365, HDL 40, LDL 92 08/09/2020 ?Medications include:  Trulicity (not taking due  to expense), glipizide (discontinued), actos (discontinued), Tresiba 35 units q HS, Novolog before meals (not taking), coumadin, Abilify and other medications that contribute to weight gain. ?He stopped pioglitizone after 3 days due to stomach cramps. ?He could not tolerated Metformin due to bowel incontinence.  ?  ?Sleep:  Eats late (10pm - midnight).  Goes to sleep late.  Wakes about 10:30 am. ?Support:  [place], patient of Dr. Kelton Pillar at [facility], PCP Charlott Rakes, MD ?  ?Weight: ?278 lbs 12/19/2021 ?279 lbs 10/24/2021 ?282 lbs 08/16/2021 loss likely due to uncontrolled blood glucose ?295 lbs 02/14/21 ?306 lbs 47/2022 ?306 lbs 11/23/2020 ?425 lbs highest adult weight 2 years ago (loss presumably due to uncontrolled blood sugar) ?Lowest adult weight 235 lbs about 8 years ago and he does not recall circumstances of increased weight gain. ?  ?Patient lives with his girlfriend.  He does the shopping and cooking. ?He is on disability.  Finances are a concern. ?His mother passed away [id number] from complications of diabetes. ?Girlfriend  goes to the gym 2-3 times per week [facility]).  Patient is not motivated to go. ?Complains of neuropathy pain which worsens with exercise. ? Diabetes Self-Management Education - 12/19/21 1700   ? ?  ? Visit Information  ? Visit Type Follow-up   ?  ? Pre-Education Assessment  ? Patient understands the diabetes disease and treatment process. Needs Review   ? Patient  understands incorporating nutritional management into lifestyle. Needs Review   ? Patient undertands incorporating physical activity into lifestyle. Needs Review   ? Patient understands using medications safely. Needs Review   ? Patient understands monitoring blood glucose, interpreting and using results Needs Review   ? Patient understands prevention, detection, and treatment of acute complications. Needs Review   ? Patient understands prevention, detection, and treatment  of chronic complications. Needs Review   ? Patient understands how to develop strategies to address psychosocial issues. Needs Review   ? Patient understands how to develop strategies to promote health/change behavior. Needs Review   ?  ? Complications  ? Last HgB A1C per patient/outside source 12.7 %   ? How often do you check your blood sugar? 0 times/day (not testing)   ?  ? Dietary Intake  ? Breakfast none   ? Lunch 2 ham or [place] and cheese sandwiches on honey wheat   ? Snack (afternoon) chips   ? Dinner chicken, pork, or beef baked or pan fried with peas and increased butter   ? Snack (evening) none   ? Beverage(s) 4-6 diet soda per day   ?  ? Exercise  ? Exercise Type ADL's   ?  ? Patient Education  ? Nutrition management  Meal options for control of blood glucose level and chronic complications.;Meal timing in regards to the patients' current diabetes medication.   ? Physical activity and exercise  Role of exercise on diabetes management, blood pressure control and cardiac health.;Helped patient identify appropriate exercises in relation to his/her diabetes, diabetes complications and other health issue.   ? Medications Reviewed patients medication for diabetes, action, purpose, timing of dose and side effects.   ? Monitoring Taught/evaluated SMBG meter.   ? Psychosocial adjustment Worked with patient to identify barriers to care and solutions   ?  ? Individualized Goals (developed by patient)  ? Nutrition General guidelines for healthy choices and portions discussed   ? Physical Activity Exercise 3-5 times per week;15 minutes per day   ? Medications take my medication as prescribed   ? Monitoring  test my blood glucose as discussed   ? Reducing Risk examine blood glucose patterns;increase portions of healthy fats   ?  ? Patient Self-Evaluation of Goals - Patient rates self as meeting previously set goals (% of time)  ? Nutrition 50 - 75 %   ? Physical Activity < 25%   ? Medications < 25%   ? Monitoring <  25%   ? Problem Solving 25 - 50%   ? Reducing Risk 25 - 50%   ? Health Coping 50 - 75 %   ?  ? Post-Education Assessment  ? Patient understands the diabetes disease and treatment process. Needs Review   ? Patient understands incorporating nutritional management into lifestyle. Needs Review   ? Patient undertands incorporating physical activity into lifestyle. Needs Review   ? Patient understands using medications safely. Needs Review   ? Patient understands monitoring blood glucose, interpreting and using results Needs Review   ? Patient understands prevention, detection, and treatment of acute complications. Needs Review   ? Patient understands prevention, detection, and treatment of chronic complications. Needs Review   ? Patient understands how to develop strategies to address psychosocial issues. Needs Review   ? Patient understands how to develop strategies to promote health/change behavior. Needs Review   ?  ? Outcomes  ? Expected Outcomes Other (comment)   interest in learing, barriers  ? Future  DMSE 3-4 months   ? Program Status Not Completed   ?  ? Subsequent Visit  ? Since your last visit have you continued or begun to take your medications as prescribed? No   ? Since your last visit have you experienced any weight changes? Loss   ? Weight Loss (lbs) 1   ? ?  ?  ? ?  ? ? ?Individualized Plan for Diabetes Self-Management Training:  ? ?Learning Objective:  Patient will have a greater understanding of diabetes self-management. ?Patient education plan is to attend individual and/or group sessions per assessed needs and concerns. ?  ?Plan:  ? ?Patient Instructions  ?Take the Novolog out of the refrigerator and put it in a place that would help you remember to take this before each meals. ? ?Bake rather than fry and use very little butter. ? ?Walk the dog every day for 15-30 minutes  ?Start back to the gym.  "Monday" ? ?Check your blood sugar as recommended. ? ? ?Expected Outcomes:  Other (comment) (interest in  learing, barriers) ? ?Education material provided:  ? ?If problems or questions, patient to contact team via:  Phone ? ?Future DSME appointment: 3-4 months ? ? ? ? ? ?

## 2021-12-19 NOTE — Patient Instructions (Signed)
-   Start Jardiance 10 mg, 1 tablet daily  ?- Stop Trulicity  ?- Continue Novolog 8 units with each meal  ?- Increase Tresiba 45 units once daily  ? ? ? ? ?HOW TO TREAT LOW BLOOD SUGARS (Blood sugar LESS THAN 70 MG/DL) ?Please follow the RULE OF 15 for the treatment of hypoglycemia treatment (when your (blood sugars are less than 70 mg/dL)  ? ?STEP 1: Take 15 grams of carbohydrates when your blood sugar is low, which includes:  ?3-4 GLUCOSE TABS  OR ?3-4 OZ OF JUICE OR REGULAR SODA OR ?ONE TUBE OF GLUCOSE GEL   ? ?STEP 2: RECHECK blood sugar in 15 MINUTES ?STEP 3: If your blood sugar is still low at the 15 minute recheck --> then, go back to STEP 1 and treat AGAIN with another 15 grams of carbohydrates. ? ?

## 2021-12-20 ENCOUNTER — Other Ambulatory Visit: Payer: Self-pay | Admitting: Internal Medicine

## 2021-12-20 ENCOUNTER — Other Ambulatory Visit: Payer: Self-pay

## 2021-12-20 MED ORDER — WARFARIN SODIUM 2.5 MG PO TABS
ORAL_TABLET | ORAL | 2 refills | Status: DC
  Filled 2021-12-20 (×3): qty 30, 30d supply, fill #0
  Filled 2022-01-21: qty 30, 30d supply, fill #1
  Filled 2022-02-16: qty 30, 30d supply, fill #2

## 2021-12-20 MED ORDER — ACCU-CHEK GUIDE VI STRP
1.0000 | ORAL_STRIP | Freq: Three times a day (TID) | 3 refills | Status: DC
  Filled 2021-12-20: qty 100, 34d supply, fill #0

## 2021-12-23 ENCOUNTER — Other Ambulatory Visit: Payer: Self-pay

## 2022-01-02 ENCOUNTER — Other Ambulatory Visit: Payer: Self-pay

## 2022-01-06 ENCOUNTER — Ambulatory Visit: Payer: Medicare Other | Attending: Family Medicine | Admitting: Pharmacist

## 2022-01-06 DIAGNOSIS — I2699 Other pulmonary embolism without acute cor pulmonale: Secondary | ICD-10-CM

## 2022-01-06 DIAGNOSIS — I825Y2 Chronic embolism and thrombosis of unspecified deep veins of left proximal lower extremity: Secondary | ICD-10-CM

## 2022-01-06 LAB — POCT INR: INR: 2.6 (ref 2.0–3.0)

## 2022-01-21 ENCOUNTER — Other Ambulatory Visit: Payer: Self-pay

## 2022-01-22 ENCOUNTER — Other Ambulatory Visit: Payer: Self-pay

## 2022-01-22 DIAGNOSIS — F3132 Bipolar disorder, current episode depressed, moderate: Secondary | ICD-10-CM | POA: Diagnosis not present

## 2022-01-22 DIAGNOSIS — F411 Generalized anxiety disorder: Secondary | ICD-10-CM | POA: Diagnosis not present

## 2022-01-22 DIAGNOSIS — F4312 Post-traumatic stress disorder, chronic: Secondary | ICD-10-CM | POA: Diagnosis not present

## 2022-01-23 ENCOUNTER — Other Ambulatory Visit: Payer: Self-pay

## 2022-01-29 ENCOUNTER — Other Ambulatory Visit: Payer: Self-pay | Admitting: Pharmacist

## 2022-01-29 ENCOUNTER — Other Ambulatory Visit: Payer: Self-pay

## 2022-01-29 DIAGNOSIS — E1149 Type 2 diabetes mellitus with other diabetic neurological complication: Secondary | ICD-10-CM

## 2022-01-29 MED ORDER — TRAMADOL HCL 50 MG PO TABS
50.0000 mg | ORAL_TABLET | Freq: Two times a day (BID) | ORAL | 2 refills | Status: DC | PRN
  Filled 2022-01-29: qty 60, 30d supply, fill #0
  Filled 2022-02-25: qty 60, 30d supply, fill #1
  Filled 2022-03-26: qty 60, 30d supply, fill #2

## 2022-01-29 NOTE — Progress Notes (Signed)
Pt is requesting a refill on his tramadol. Will route to PCP.  ?

## 2022-02-04 ENCOUNTER — Other Ambulatory Visit: Payer: Self-pay

## 2022-02-05 ENCOUNTER — Other Ambulatory Visit: Payer: Self-pay

## 2022-02-06 ENCOUNTER — Encounter: Payer: Self-pay | Admitting: Pharmacist

## 2022-02-06 ENCOUNTER — Ambulatory Visit: Payer: Medicare Other | Attending: Family Medicine | Admitting: Pharmacist

## 2022-02-06 VITALS — BP 83/56 | HR 106 | Temp 98.6°F | Ht 72.0 in | Wt 270.0 lb

## 2022-02-06 DIAGNOSIS — Z0000 Encounter for general adult medical examination without abnormal findings: Secondary | ICD-10-CM

## 2022-02-06 DIAGNOSIS — I2699 Other pulmonary embolism without acute cor pulmonale: Secondary | ICD-10-CM | POA: Diagnosis not present

## 2022-02-06 DIAGNOSIS — I825Y2 Chronic embolism and thrombosis of unspecified deep veins of left proximal lower extremity: Secondary | ICD-10-CM

## 2022-02-06 LAB — POCT INR: INR: 2.6 (ref 2.0–3.0)

## 2022-02-06 NOTE — Progress Notes (Signed)
? ?Subjective:  ? Bradley [name] is a 41 y.o. male who presents for Medicare Annual/Subsequent preventive examination. ? ?Objective:  ?  ?Today's Vitals  ? 02/06/22 1434 02/06/22 1439  ?BP: (!) 83/56   ?Pulse: (!) 106   ?Temp: 98.6 ?F (37 ?C)   ?SpO2: 100%   ?Weight: 270 lb (122.5 kg)   ?Height: 6' (1.829 m)   ?PainSc: 2  2   ? ?Body mass index is 36.62 kg/m?. ? ? ?  02/06/2022  ?  2:43 PM 12/17/2020  ?  1:18 PM 11/23/2020  ? 10:57 AM 04/14/2019  ?  4:05 PM 11/27/2018  ?  6:00 AM 11/26/2018  ? 10:33 PM 08/12/2018  ? 10:46 AM  ?Advanced Directives  ?Does Patient Have a Medical Advance Directive? No No No No No No No  ?Would patient like information on creating a medical advance directive? No - Patient declined No - Patient declined Yes (MAU/Ambulatory/Procedural Areas - Information given) Yes (Inpatient - patient defers creating a medical advance directive at this time - Information given);Yes (ED - Information included in AVS) No - Patient declined  No - Patient declined  ? ?Current Medications (verified) ?Outpatient Encounter Medications as of 02/06/2022  ?Medication Sig  ? ARIPiprazole (ABILIFY) 5 MG tablet Take 1 tablet (5 mg total) by mouth at bedtime.  ? ARIPiprazole (ABILIFY) 5 MG tablet Take 1 tablet(s) by mouth at bedtime for mood  ? atorvastatin (LIPITOR) 20 MG tablet Take 1 tablet by mouth daily.  ? busPIRone (BUSPAR) 7.5 MG tablet Take 1 tablet(s) by mouth twice a day for severe anxiety/panic attacks (Patient taking differently: 7.5 mg. Takes as needed)  ? doxepin (SINEQUAN) 50 MG capsule Take 1 capsule (50 mg total) by mouth as needed for sleep  ? doxepin (SINEQUAN) 50 MG capsule Take 1 capsule(s) by mouth at bedtime as needed for sleep  ? DULoxetine (CYMBALTA) 30 MG capsule Take 1 capsule (30 mg total) by mouth every morning.  ? DULoxetine (CYMBALTA) 30 MG capsule Take 1 capsule(s) by mouth daily for anxiety/depression  ? DULoxetine (CYMBALTA) 60 MG capsule Take 1 capsule(s) by mouth at bedtime for  anxiety/depression  ? empagliflozin (JARDIANCE) 10 MG TABS tablet Take 1 tablet (10 mg total) by mouth daily before breakfast.  ? glucose blood (ACCU-CHEK GUIDE) test strip 1 each by Other route 3 (three) times daily. Use as instructed  ? insulin degludec (TRESIBA FLEXTOUCH) 100 UNIT/ML FlexTouch Pen Inject 45 Units into the skin daily.  ? sildenafil (VIAGRA) 50 MG tablet Take 1 tablet (50 mg total) by mouth daily as needed for erectile dysfunction. At least 24 hours between doses  ? traMADol (ULTRAM) 50 MG tablet Take 1 tablet (50 mg total) by mouth every 12 (twelve) hours as needed.  ? warfarin (COUMADIN) 2.5 MG tablet TAKE BY MOUTH DAILY AS DIRECTED BY THE COUMADIN CLINIC.  ? albuterol (VENTOLIN HFA) 108 (90 Base) MCG/ACT inhaler INHALE 1-2 PUFFS INTO THE LUNGS EVERY 6 (SIX) HOURS AS NEEDED FOR WHEEZING OR SHORTNESS OF BREATH.  ? Blood Glucose Monitoring Suppl (ONETOUCH VERIO) w/Device KIT Use to check blood sugar up to 3 times daily (E11.69, Z79.4) (Patient not taking: Reported on 12/19/2021)  ? Continuous Blood Gluc Sensor (DEXCOM G6 SENSOR) MISC 1 Device by Does not apply route as directed. (Patient not taking: Reported on 12/19/2021)  ? Continuous Blood Gluc Transmit (DEXCOM G6 TRANSMITTER) MISC 1 Device by Does not apply route as directed. (Patient not taking: Reported on 12/19/2021)  ? insulin  aspart (NOVOLOG FLEXPEN) 100 UNIT/ML FlexPen Inject 8 Units into the skin 3 (three) times daily with meals. (Patient not taking: Reported on 12/19/2021)  ? Insulin Pen Needle 31G X 5 MM MISC Use as directed 3 times daily. (Patient not taking: Reported on 02/06/2022)  ? Menthol 7.5 % PTCH Apply 1 patch topically as needed (pain). (Patient not taking: Reported on 02/06/2022)  ? mupirocin ointment (BACTROBAN) 2 % Apply to right great toe once daily. (Patient not taking: Reported on 02/06/2022)  ? OneTouch Delica Lancets 99B MISC 1 Device by Does not apply route 3 (three) times daily. (Patient not taking: Reported on 12/19/2021)   ? ?No facility-administered encounter medications on file as of 02/06/2022.  ? ?Allergies (verified) ?Actos [pioglitazone], Gabapentin, and Lyrica [pregabalin]  ? ?History: ?Past Medical History:  ?Diagnosis Date  ? ADHD   ? ADHD (attention deficit hyperactivity disorder)   ? Bipolar 1 disorder ([place])   ? Bipolar disorder ([place])   ? Diabetes mellitus without complication ([place])   ? Hyperlipidemia   ? Morbid obesity ([place])   ? Obesity   ? Panic attack   ? Varicose veins of both lower extremities with pain   ? ?Past Surgical History:  ?Procedure Laterality Date  ? surgery on meatus as a child    ? ?Family History  ?Problem Relation Age of Onset  ? Rheum arthritis Mother   ? Diabetes Mother   ? Heart failure Mother   ? Stroke Mother   ? Glaucoma Maternal Grandmother   ? Lung cancer Paternal Grandmother   ?     smoker  ? ?Social History  ? ?Socioeconomic History  ? Marital status: Single  ?  Spouse name: Not on file  ? Number of children: Not on file  ? Years of education: Not on file  ? Highest education level: Not on file  ?Occupational History  ? Not on file  ?Tobacco Use  ? Smoking status: Every Day  ?  Packs/day: 1.00  ?  Types: Cigarettes  ? Smokeless tobacco: Never  ?Vaping Use  ? Vaping Use: Never used  ?Substance and Sexual Activity  ? Alcohol use: Yes  ?  Comment: Rarely  ? Drug use: No  ? Sexual activity: Not on file  ?Other Topics Concern  ? Not on file  ?Social History Narrative  ? ** Merged History Encounter **  ?    ? ?Social Determinants of Health  ? ?Financial Resource Strain: Not on file  ?Food Insecurity: Not on file  ?Transportation Needs: Not on file  ?Physical Activity: Not on file  ?Stress: Not on file  ?Social Connections: Not on file  ? ?Tobacco Counseling ?Ready to quit: No ?Counseling given: Yes ? ?Clinical Intake: ? ?Pre-visit preparation completed: No ? ?Pain : 0-10 ?Pain Score: 2  ?Pain Type: Chronic pain ?Pain Location: Leg ?Pain Orientation: Right, Left ?Pain Descriptors / Indicators:  Aching ?Pain Onset: More than a month ago ?Pain Frequency: Constant ? ? ?Nutritional Status: BMI > 30  Obese ?Diabetes: No ? ?How often do you need to have someone help you when you read instructions, pamphlets, or other written materials from your doctor or pharmacy?: 1 - Never ? ?Diabetic? Yes ? ?Interpreter Needed?: No ? ?Activities of Daily Living ? ?  02/06/2022  ?  2:44 PM  ?In your present state of health, do you have any difficulty performing the following activities:  ?Hearing? 0  ?Vision? 0  ?Difficulty concentrating or making decisions? 0  ?Walking or  climbing stairs? 0  ?Dressing or bathing? 0  ?Preparing Food and eating ? N  ?Using the Toilet? N  ?In the past six months, have you accidently leaked urine? N  ?Do you have problems with loss of bowel control? N  ?Managing your Medications? N  ?Managing your Finances? N  ?Housekeeping or managing your Housekeeping? N  ? ?Patient Care Team: ?Charlott Rakes, MD as PCP - General (Family Medicine) ? ?Indicate any recent Medical Services you may have received from other than [facility] providers in the past year (date may be approximate). ? ?Assessment:  ? This is a routine wellness examination for Jerral. ? ?Hearing/Vision screen ?No results found. ? ?Dietary issues and exercise activities discussed: ?Current Exercise Habits: The patient does not participate in regular exercise at present, Exercise limited by: None identified ? ? Goals Addressed   ?None ?  ?Depression Screen ? ?  02/06/2022  ?  2:43 PM 11/23/2020  ? 10:54 AM 08/09/2020  ? 11:11 AM 05/03/2020  ? 11:11 AM 06/22/2019  ?  3:56 PM 04/07/2019  ? 10:42 AM 11/15/2018  ?  4:27 PM  ?PHQ 2/9 Scores  ?PHQ - 2 Score 0 $Remov'1 2 2 2 2 2  'iKliWi$ ?PHQ- 9 Score   '8 8 7 7 7  '$ ?  ?Fall Risk ? ?  02/06/2022  ?  2:43 PM 10/21/2021  ?  3:42 PM 11/23/2020  ? 10:54 AM 08/09/2020  ? 11:06 AM 01/12/2020  ? 11:17 AM  ?Fall Risk   ?Falls in the past year? 1 0 0 0 0  ?Number falls in past yr: 0 0  0   ?Injury with Fall? 0 0  0   ?Risk for fall due to :  History of fall(s)      ?Follow up Falls evaluation completed;Education provided;Falls prevention discussed      ? ? ?FALL RISK PREVENTION PERTAINING TO THE HOME: ? ?Any stairs in or around the home? No  ?If so, are

## 2022-02-17 ENCOUNTER — Other Ambulatory Visit: Payer: Self-pay

## 2022-02-18 ENCOUNTER — Encounter: Payer: Self-pay | Admitting: Podiatry

## 2022-02-18 ENCOUNTER — Ambulatory Visit (INDEPENDENT_AMBULATORY_CARE_PROVIDER_SITE_OTHER): Payer: Medicare Other | Admitting: Podiatry

## 2022-02-18 ENCOUNTER — Other Ambulatory Visit: Payer: Self-pay

## 2022-02-18 DIAGNOSIS — M79674 Pain in right toe(s): Secondary | ICD-10-CM | POA: Diagnosis not present

## 2022-02-18 DIAGNOSIS — E1142 Type 2 diabetes mellitus with diabetic polyneuropathy: Secondary | ICD-10-CM

## 2022-02-18 DIAGNOSIS — M79675 Pain in left toe(s): Secondary | ICD-10-CM | POA: Diagnosis not present

## 2022-02-18 DIAGNOSIS — B351 Tinea unguium: Secondary | ICD-10-CM

## 2022-02-19 NOTE — Progress Notes (Signed)
Subjective:  Patient ID: Bradley [name], male    DOB: September 15, 1981,  MRN: [id number]  Bradley [name] presents to clinic today for at risk foot care with history of diabetic neuropathy and callus(es) right lower extremity and painful thick toenails that are difficult to trim. Painful toenails interfere with ambulation. Aggravating factors include wearing enclosed shoe gear. Pain is relieved with periodic professional debridement. Painful calluses are aggravated when weightbearing with and without shoegear. Pain is relieved with periodic professional debridement.  Patient did follow up with Dr. Lilian Kapur and right great toe cellulitis was resolved.  Patient states he continues to walk barefoot.  Last known HgA1c was 12.7%. He is now under the care of Dr. Lonzo Cloud in Endocrinology.  New problem(s): None.   PCP is Hoy Register, MD , and last visit was October 21, 2021.  Allergies  Allergen Reactions   Actos [Pioglitazone] Other (See Comments)    Stomach cramps   Gabapentin Other (See Comments)    Crying spells   Lyrica [Pregabalin] Other (See Comments)    Makes the patient somnolent    Review of Systems: Negative except as noted in the HPI.  Objective: Constitutional Bradley [name] is a pleasant 41 y.o. Caucasian male, obese in NAD. AAO x 3.   Vascular Capillary refill time to digits immediate b/l. Palpable DP pulse(s) b/l lower extremities Palpable PT pulse(s) b/l lower extremities Pedal hair absent. Lower extremity skin temperature gradient within normal limits. No pain with calf compression b/l. Nonpitting edema noted b/l lower extremities. Varicosities present b/l. No cyanosis or clubbing noted. He has hyperpigmentation consistent with venous stasis/chronic venous insufficiency b/l LE.  Neurologic Normal speech. Oriented to person, place, and time. Pt has subjective symptoms of neuropathy. Protective sensation decreased with 10 gram monofilament b/l lower extremities.  Vibratory sensation intact b/l.  Dermatologic Toenails 2-5 bilaterally and L hallux elongated, discolored, dystrophic, thickened, and crumbly with subungual debris and tenderness to dorsal palpation. Pincer nail deformity L hallux and R hallux. No erythema, no edema, no drainage, no fluctuance. Right great toe nailbed healed with mild hyperkeratosis. No erythema, no edema, no drainage, no fluctuance. Plantar aspect of patient's feet soiled consistent with walking barefoot.  Orthopedic: Normal muscle strength 5/5 to all lower extremity muscle groups bilaterally. No gross bony deformities b/l lower extremities. Patient ambulates independent of any assistive aids.   Radiographs: None    Latest Ref Rng & Units 12/19/2021    3:24 PM 08/16/2021    2:35 PM 04/15/2021    2:14 PM  Hemoglobin A1C  Hemoglobin-A1c 4.0 - 5.6 % 12.7   12.7   12.7     Assessment/Plan: 1. Pain due to onychomycosis of toenails of both feet   2. Diabetic peripheral neuropathy associated with type 2 diabetes mellitus (HCC)     -Patient was evaluated and treated. All patient's and/or POA's questions/concerns answered on today's visit. -Patient refused callus debridement of right foot on today. I informed him this could lead to wound formation if not debrided. Recommended he use pumice stone to file callus. Pumice stone can be purchased at [employer] or any local drug store. He related understanding. -Nailbed right great toe gently filed with dremel. Patient instructed to apply his Mupirocin Ointment or Vaseline Petroleum Jelly until nail plate grows out. He related understanding. -Toenails 1-5 bilaterally debrided in length and girth without iatrogenic bleeding with sterile nail nipper and dremel.  -Continue soft, supportive shoe gear daily. Inspect feet daily. Moisturize feet once daily avoiding application  between toes. Report any pedal injuries to medical professional immediately -Patient/POA to call should there be  question/concern in the interim.   Return in about 3 months (around 05/21/2022).  Freddie Breech, DPM

## 2022-02-26 ENCOUNTER — Other Ambulatory Visit: Payer: Self-pay

## 2022-03-06 ENCOUNTER — Other Ambulatory Visit: Payer: Self-pay

## 2022-03-10 ENCOUNTER — Other Ambulatory Visit: Payer: Self-pay

## 2022-03-13 ENCOUNTER — Other Ambulatory Visit: Payer: Self-pay

## 2022-03-13 ENCOUNTER — Ambulatory Visit: Payer: Medicare Other | Attending: Family Medicine | Admitting: Pharmacist

## 2022-03-13 DIAGNOSIS — I825Y2 Chronic embolism and thrombosis of unspecified deep veins of left proximal lower extremity: Secondary | ICD-10-CM

## 2022-03-13 DIAGNOSIS — I2699 Other pulmonary embolism without acute cor pulmonale: Secondary | ICD-10-CM | POA: Diagnosis not present

## 2022-03-13 LAB — POCT INR: INR: 2.7 (ref 2.0–3.0)

## 2022-03-13 MED ORDER — WARFARIN SODIUM 2.5 MG PO TABS
ORAL_TABLET | Freq: Every day | ORAL | 2 refills | Status: DC
  Filled 2022-03-13: qty 30, 30d supply, fill #0
  Filled 2022-04-17: qty 30, 30d supply, fill #1
  Filled 2022-05-17: qty 30, 30d supply, fill #2

## 2022-03-13 NOTE — Addendum Note (Signed)
Addended by: Lois Huxley, Jeannett Senior L on: 03/13/2022 03:10 PM   Modules accepted: Orders

## 2022-03-14 ENCOUNTER — Other Ambulatory Visit: Payer: Self-pay

## 2022-03-17 ENCOUNTER — Other Ambulatory Visit: Payer: Self-pay

## 2022-03-18 ENCOUNTER — Other Ambulatory Visit: Payer: Self-pay | Admitting: Physician Assistant

## 2022-03-18 DIAGNOSIS — D751 Secondary polycythemia: Secondary | ICD-10-CM

## 2022-03-19 ENCOUNTER — Inpatient Hospital Stay (HOSPITAL_BASED_OUTPATIENT_CLINIC_OR_DEPARTMENT_OTHER): Payer: Medicare Other | Admitting: Physician Assistant

## 2022-03-19 ENCOUNTER — Inpatient Hospital Stay: Payer: Medicare Other | Attending: Physician Assistant

## 2022-03-19 VITALS — BP 92/79 | HR 49 | Temp 97.3°F | Resp 20 | Wt 265.0 lb

## 2022-03-19 DIAGNOSIS — Z79899 Other long term (current) drug therapy: Secondary | ICD-10-CM | POA: Diagnosis not present

## 2022-03-19 DIAGNOSIS — D751 Secondary polycythemia: Secondary | ICD-10-CM | POA: Insufficient documentation

## 2022-03-19 DIAGNOSIS — F17210 Nicotine dependence, cigarettes, uncomplicated: Secondary | ICD-10-CM | POA: Diagnosis not present

## 2022-03-19 DIAGNOSIS — F319 Bipolar disorder, unspecified: Secondary | ICD-10-CM | POA: Insufficient documentation

## 2022-03-19 DIAGNOSIS — Z7901 Long term (current) use of anticoagulants: Secondary | ICD-10-CM | POA: Diagnosis not present

## 2022-03-19 LAB — CBC WITH DIFFERENTIAL (CANCER CENTER ONLY)
Abs Immature Granulocytes: 0.04 10*3/uL (ref 0.00–0.07)
Basophils Absolute: 0.1 10*3/uL (ref 0.0–0.1)
Basophils Relative: 1 %
Eosinophils Absolute: 0.2 10*3/uL (ref 0.0–0.5)
Eosinophils Relative: 2 %
HCT: 56.6 % — ABNORMAL HIGH (ref 39.0–52.0)
Hemoglobin: 19.5 g/dL — ABNORMAL HIGH (ref 13.0–17.0)
Immature Granulocytes: 0 %
Lymphocytes Relative: 20 %
Lymphs Abs: 2 10*3/uL (ref 0.7–4.0)
MCH: 28.5 pg (ref 26.0–34.0)
MCHC: 34.5 g/dL (ref 30.0–36.0)
MCV: 82.7 fL (ref 80.0–100.0)
Monocytes Absolute: 0.6 10*3/uL (ref 0.1–1.0)
Monocytes Relative: 6 %
Neutro Abs: 7.2 10*3/uL (ref 1.7–7.7)
Neutrophils Relative %: 71 %
Platelet Count: 114 10*3/uL — ABNORMAL LOW (ref 150–400)
RBC: 6.84 MIL/uL — ABNORMAL HIGH (ref 4.22–5.81)
RDW: 13.7 % (ref 11.5–15.5)
WBC Count: 10.1 10*3/uL (ref 4.0–10.5)
nRBC: 0 % (ref 0.0–0.2)

## 2022-03-19 LAB — CMP (CANCER CENTER ONLY)
ALT: 29 U/L (ref 0–44)
AST: 20 U/L (ref 15–41)
Albumin: 4.1 g/dL (ref 3.5–5.0)
Alkaline Phosphatase: 70 U/L (ref 38–126)
Anion gap: 11 (ref 5–15)
BUN: 14 mg/dL (ref 6–20)
CO2: 25 mmol/L (ref 22–32)
Calcium: 8.9 mg/dL (ref 8.9–10.3)
Chloride: 101 mmol/L (ref 98–111)
Creatinine: 0.91 mg/dL (ref 0.61–1.24)
GFR, Estimated: >60 mL/min (ref >60)
Glucose, Bld: 241 mg/dL — ABNORMAL HIGH (ref 70–99)
Potassium: 4.6 mmol/L (ref 3.5–5.1)
Sodium: 137 mmol/L (ref 135–145)
Total Bilirubin: 1.2 mg/dL (ref 0.3–1.2)
Total Protein: 7.7 g/dL (ref 6.5–8.1)

## 2022-03-19 NOTE — Progress Notes (Signed)
[place] [phone]   [phone]  PROGRESS NOTE  Patient Care Team: Charlott Rakes, MD as PCP - General (Family Medicine)  Hematological/Oncological History 1) 04/21/2016: Labs showed WBC 12.0 (H), RBC 6.09 (H), Hgb 17.3 (H), Plt 132K 2) 04/19/2020: Labs showed WBC 9.5, RBC 6.29 (H), Hgb 17.8 (H), Plt 111K 3) 12/06/2020: Labs showed WBC 9.1, RBC 6.76 (H), Hgb 18.9 (H), Plt 120K 4) 12/17/2020: Established care with Dede Query PA-C   CHIEF COMPLAINTS: Secondary polycythemia  HISTORY OF PRESENTING ILLNESS:  Bradley [name] 41 y.o. male who returns for a follow for secondary polycythemia and thrombocytopenia. He is unaccompanied for this visit.   Bradley [name] continues to smoke 1 pack per day. He tried to quit earlier in the year but was not successful. He reports his energy levels are fairly stable. He is able to complete his ADLs on his own. He denies any GI symptoms including nausea, vomiting, diarrhea or constipation. He denies easy bruising or signs of bleeding. He has chronic SOB with exertion secondary to COPD. He denies any fevers, chills, night sweats, chest pain, cough, headaches, bleeding or easy bruising. He has no other complaints. Rest of 10 point ROS is below.   MEDICAL HISTORY:  Past Medical History:  Diagnosis Date   ADHD    ADHD (attention deficit hyperactivity disorder)    Bipolar 1 disorder (HCC)    Bipolar disorder ([place])    Diabetes mellitus without complication ([place])    Hyperlipidemia    Morbid obesity (HCC)    Obesity    Panic attack    Varicose veins of both lower extremities with pain     SURGICAL HISTORY: Past Surgical History:  Procedure Laterality Date   surgery on meatus as a child      SOCIAL HISTORY: Social History   Socioeconomic History   Marital status: Single    Spouse name: Not on file   Number of children: Not on file   Years of education: Not on file   Highest education level: Not on file   Occupational History   Not on file  Tobacco Use   Smoking status: Every Day    Packs/day: 1.00    Types: Cigarettes   Smokeless tobacco: Never  Vaping Use   Vaping Use: Never used  Substance and Sexual Activity   Alcohol use: Yes    Comment: Rarely   Drug use: No   Sexual activity: Not on file  Other Topics Concern   Not on file  Social History Narrative   ** Merged History Encounter **       Social Determinants of Health   Financial Resource Strain: Not on file  Food Insecurity: Not on file  Transportation Needs: Not on file  Physical Activity: Not on file  Stress: Not on file  Social Connections: Not on file  Intimate Partner Violence: Not on file    FAMILY HISTORY: Family History  Problem Relation Age of Onset   Rheum arthritis Mother    Diabetes Mother    Heart failure Mother    Stroke Mother    Glaucoma Maternal Grandmother    Lung cancer Paternal Grandmother        smoker    ALLERGIES:  is allergic to actos [pioglitazone], gabapentin, and lyrica [pregabalin].  MEDICATIONS:  Current Outpatient Medications  Medication Sig Dispense Refill   ARIPiprazole (ABILIFY) 5 MG tablet Take 1 tablet (5 mg total) by mouth at bedtime. 30 tablet 2   ARIPiprazole (  ABILIFY) 5 MG tablet Take 1 tablet(s) by mouth at bedtime for mood 30 tablet 2   atorvastatin (LIPITOR) 20 MG tablet Take 1 tablet by mouth daily. 30 tablet 3   Blood Glucose Calibration (ACCU-CHEK ACTIVE GLUCOSE CONT VI) by In Vitro route.     busPIRone (BUSPAR) 7.5 MG tablet Take 1 tablet(s) by mouth twice a day for severe anxiety/panic attacks (Patient taking differently: 7.5 mg. Takes as needed) 60 tablet 2   doxepin (SINEQUAN) 50 MG capsule Take 1 capsule (50 mg total) by mouth as needed for sleep 30 capsule 2   doxepin (SINEQUAN) 50 MG capsule Take 1 capsule(s) by mouth at bedtime as needed for sleep 30 capsule 2   DULoxetine (CYMBALTA) 30 MG capsule Take 1 capsule (30 mg total) by mouth every morning. 30  capsule 2   DULoxetine (CYMBALTA) 30 MG capsule Take 1 capsule(s) by mouth daily for anxiety/depression 30 capsule 2   DULoxetine (CYMBALTA) 60 MG capsule Take 1 capsule(s) by mouth at bedtime for anxiety/depression 30 capsule 2   empagliflozin (JARDIANCE) 10 MG TABS tablet Take 1 tablet (10 mg total) by mouth daily before breakfast. 90 tablet 2   glucose blood (ACCU-CHEK GUIDE) test strip 1 each by Other route 3 (three) times daily. Use as instructed 300 each 3   insulin aspart (NOVOLOG FLEXPEN) 100 UNIT/ML FlexPen Inject 8 Units into the skin 3 (three) times daily with meals. 30 mL 3   insulin degludec (TRESIBA FLEXTOUCH) 100 UNIT/ML FlexTouch Pen Inject 45 Units into the skin daily. 45 mL 3   Insulin Pen Needle 31G X 5 MM MISC Use as directed 3 times daily. 300 each 3   Menthol 7.5 % PTCH Apply 1 patch topically as needed (pain).     mupirocin ointment (BACTROBAN) 2 % Apply to right great toe once daily. 30 g 1   OneTouch Delica Lancets 73X MISC 1 Device by Does not apply route 3 (three) times daily. 300 each 3   sildenafil (VIAGRA) 50 MG tablet Take 1 tablet (50 mg total) by mouth daily as needed for erectile dysfunction. At least 24 hours between doses 10 tablet 1   traMADol (ULTRAM) 50 MG tablet Take 1 tablet (50 mg total) by mouth every 12 (twelve) hours as needed. 60 tablet 2   warfarin (COUMADIN) 2.5 MG tablet TAKE 1 TABLET BY MOUTH DAILY AS DIRECTED BY THE COUMADIN CLINIC. 30 tablet 2   albuterol (VENTOLIN HFA) 108 (90 Base) MCG/ACT inhaler INHALE 1-2 PUFFS INTO THE LUNGS EVERY 6 (SIX) HOURS AS NEEDED FOR WHEEZING OR SHORTNESS OF BREATH. 18 g 2   Blood Glucose Monitoring Suppl (ONETOUCH VERIO) w/Device KIT Use to check blood sugar up to 3 times daily (E11.69, Z79.4) (Patient not taking: Reported on 03/19/2022) 1 kit 0   Continuous Blood Gluc Sensor (DEXCOM G6 SENSOR) MISC 1 Device by Does not apply route as directed. (Patient not taking: Reported on 03/19/2022) 9 each 3   Continuous Blood  Gluc Transmit (DEXCOM G6 TRANSMITTER) MISC 1 Device by Does not apply route as directed. (Patient not taking: Reported on 03/19/2022) 1 each 3   No current facility-administered medications for this visit.    REVIEW OF SYSTEMS:   Constitutional: ( - ) fevers, ( - )  chills , ( - ) night sweats Eyes: ( - ) blurriness of vision, ( - ) double vision, ( - ) watery eyes Ears, nose, mouth, throat, and face: ( - ) mucositis, ( - ) sore throat  Respiratory: ( - ) cough, ( + ) dyspnea, ( - ) wheezes Cardiovascular: ( - ) palpitation, ( - ) chest discomfort, ( + ) lower extremity swelling Gastrointestinal:  ( - ) nausea, ( - ) heartburn, ( - ) change in bowel habits Skin: ( - ) abnormal skin rashes Lymphatics: ( - ) new lymphadenopathy, ( - ) easy bruising Neurological: ( + ) numbness, ( - ) tingling, ( - ) new weaknesses Behavioral/Psych: ( - ) mood change, ( - ) new changes  All other systems were reviewed with the patient and are negative.  PHYSICAL EXAMINATION: ECOG PERFORMANCE STATUS: 1 - Symptomatic but completely ambulatory  Vitals:   03/19/22 1435  BP: 92/79  Pulse: (!) 49  Resp: 20  Temp: (!) 97.3 F (36.3 C)  SpO2: 97%   Filed Weights   03/19/22 1435  Weight: 265 lb (120.2 kg)    GENERAL: well appearing male in NAD  SKIN: texture, turgor are normal, no rashes or significant lesions. Venous stasis dermatitis in lower extremities EYES: conjunctiva are pink and non-injected, sclera clear  OROPHARYNX: no exudate, no erythema; lips, buccal mucosa, and tongue normal. Severe teeth decay. NECK: supple, non-tender LYMPH:  no palpable lymphadenopathy in the cervical, axillary or supraclavicular lymph nodes.  LUNGS: Bilateral, diffuse wheezing upon auscultation HEART:  Regular rhythm and no murmurs. Lower extremity edema (L > R) ABDOMEN: soft, non-tender, non-distended, normal bowel sounds Musculoskeletal: no cyanosis of digits and no clubbing  PSYCH: alert & oriented x 3, fluent  speech NEURO: no focal motor/sensory deficits  LABORATORY DATA:  I have reviewed the data as listed    Latest Ref Rng & Units 03/19/2022    2:21 PM 09/17/2021    2:40 PM 03/19/2021   12:28 PM  CBC  WBC 4.0 - 10.5 K/uL 10.1  9.5  9.3   Hemoglobin 13.0 - 17.0 g/dL 19.5  17.3  17.5   Hematocrit 39.0 - 52.0 % 56.6  49.7  50.7   Platelets 150 - 400 K/uL 114  102  102        Latest Ref Rng & Units 03/19/2022    2:21 PM 09/17/2021    2:40 PM 03/19/2021   12:28 PM  CMP  Glucose 70 - 99 mg/dL 241  385  481   BUN 6 - 20 mg/dL $Remove'14  14  11   'qVclsCd$ Creatinine 0.61 - 1.24 mg/dL 0.91  0.84  0.99   Sodium 135 - 145 mmol/L 137  136  133   Potassium 3.5 - 5.1 mmol/L 4.6  4.3  4.4   Chloride 98 - 111 mmol/L 101  99  97   CO2 22 - 32 mmol/L $RemoveB'25  29  27   'dsVevWla$ Calcium 8.9 - 10.3 mg/dL 8.9  9.3  9.0   Total Protein 6.5 - 8.1 g/dL 7.7  7.0  7.3   Total Bilirubin 0.3 - 1.2 mg/dL 1.2  1.1  0.6   Alkaline Phos 38 - 126 U/L 70  78  83   AST 15 - 41 U/L $Remo'20  16  20   'xlbck$ ALT 0 - 44 U/L 29  22  33     ASSESSMENT & PLAN Bradley [name] is a 41 y.o. male presenting to the clinic for evaluation for secondary polycythemia  #Polycythemia 2/2 smoking --MPN panel and BCR/ABL was negative.Likely etiology is smoking.  --Labs today reveal Hgb of 19.5, worse from 08/2021.  --Recommend smoking cessation. Continue to monitor.  --Patient reports snoring  and ongoing fatigue. Will make referral to sleep center to evaluate for obstructive sleep apnea.  --RTC in 6 months with labs.   #Chronic smoking --Patient currently smokes 1 pack/day. Encouraged to stop smoking.   #History of DVT and PE: --05/22/2016: DVT of left lower extremity involving common femoral, femoral,popliteal and saphenofemoral veins. Treated with xarelto for one year.  --11/27/2018: Bilateral PE. Patient was treated prior with doxycycline for pneumonia. Treated PE with lovenox >> coumadin.  --His family history is remarkable for his mother who had a DVT in her  12s.  --Risk factors include venous insufficiency, smoking, and sedentary lifestyle. --Patient will remain on lifelong anticoagulation due to risk factors mentioned above, multiple DVT/PE episodes, and family history of DVT.   #Thrombocytopenia:  --Likely etiology is possible hepatic cirrhosis and splenomegaly as mentioned on CT scan from 11/27/2018.  --Patient denies any bleeding or easy bruising --Today's platelet count has overall stable at 114K. Monitor for now. --If levels worsen, consider repeat imaging to further evaluate.   #Hyperglycemia: --Today's glucose level is 241, chronically elevated. Patient has T2DM currently on insulin, glipizide, trulicity.  --Continue to monitor.    Orders Placed This Encounter  Procedures   Polysomnography 4 or more parameters    Standing Status:   Future    Standing Expiration Date:   03/20/2023    Order Specific Question:   Where should this test be performed:    Answer:   [facility]    All questions were answered. The patient knows to call the clinic with any problems, questions or concerns.  I have spent a total of 25 minutes minutes of face-to-face and non-face-to-face time, preparing to see the patient, obtaining and/or reviewing separately obtained history, performing a medically appropriate examination, counseling and educating the patient, ordering tests, referring and communicating with other health care professionals, documenting clinical information in the electronic health record, and care coordination.    Dede Query, PA-C Department of Hematology/Oncology [place] at [facility] Phone: [phone]

## 2022-03-26 ENCOUNTER — Other Ambulatory Visit: Payer: Self-pay

## 2022-03-27 ENCOUNTER — Other Ambulatory Visit: Payer: Self-pay

## 2022-04-07 ENCOUNTER — Other Ambulatory Visit: Payer: Self-pay | Admitting: Pharmacist

## 2022-04-07 ENCOUNTER — Other Ambulatory Visit: Payer: Self-pay

## 2022-04-07 DIAGNOSIS — E1149 Type 2 diabetes mellitus with other diabetic neurological complication: Secondary | ICD-10-CM

## 2022-04-07 DIAGNOSIS — E781 Pure hyperglyceridemia: Secondary | ICD-10-CM

## 2022-04-07 MED ORDER — ATORVASTATIN CALCIUM 20 MG PO TABS
ORAL_TABLET | Freq: Every day | ORAL | 3 refills | Status: DC
  Filled 2022-04-07: qty 30, 30d supply, fill #0
  Filled 2022-05-05: qty 30, 30d supply, fill #1
  Filled 2022-06-04: qty 30, 30d supply, fill #2

## 2022-04-08 ENCOUNTER — Other Ambulatory Visit: Payer: Self-pay

## 2022-04-18 ENCOUNTER — Other Ambulatory Visit: Payer: Self-pay

## 2022-04-21 ENCOUNTER — Encounter: Payer: Medicare Other | Attending: Family Medicine | Admitting: Dietician

## 2022-04-21 ENCOUNTER — Encounter: Payer: Self-pay | Admitting: Family Medicine

## 2022-04-21 ENCOUNTER — Other Ambulatory Visit: Payer: Self-pay

## 2022-04-21 ENCOUNTER — Ambulatory Visit: Payer: Medicare Other | Attending: Family Medicine | Admitting: Family Medicine

## 2022-04-21 ENCOUNTER — Encounter: Payer: Self-pay | Admitting: Dietician

## 2022-04-21 VITALS — BP 100/66 | HR 104 | Temp 97.8°F | Ht 72.0 in | Wt 263.6 lb

## 2022-04-21 DIAGNOSIS — E781 Pure hyperglyceridemia: Secondary | ICD-10-CM | POA: Diagnosis not present

## 2022-04-21 DIAGNOSIS — Z7902 Long term (current) use of antithrombotics/antiplatelets: Secondary | ICD-10-CM | POA: Insufficient documentation

## 2022-04-21 DIAGNOSIS — Z794 Long term (current) use of insulin: Secondary | ICD-10-CM | POA: Diagnosis not present

## 2022-04-21 DIAGNOSIS — E1140 Type 2 diabetes mellitus with diabetic neuropathy, unspecified: Secondary | ICD-10-CM | POA: Diagnosis not present

## 2022-04-21 DIAGNOSIS — F319 Bipolar disorder, unspecified: Secondary | ICD-10-CM | POA: Insufficient documentation

## 2022-04-21 DIAGNOSIS — Z713 Dietary counseling and surveillance: Secondary | ICD-10-CM | POA: Insufficient documentation

## 2022-04-21 DIAGNOSIS — F411 Generalized anxiety disorder: Secondary | ICD-10-CM | POA: Diagnosis not present

## 2022-04-21 DIAGNOSIS — F3132 Bipolar disorder, current episode depressed, moderate: Secondary | ICD-10-CM | POA: Diagnosis not present

## 2022-04-21 DIAGNOSIS — Z86718 Personal history of other venous thrombosis and embolism: Secondary | ICD-10-CM | POA: Insufficient documentation

## 2022-04-21 DIAGNOSIS — Z79899 Other long term (current) drug therapy: Secondary | ICD-10-CM | POA: Diagnosis not present

## 2022-04-21 DIAGNOSIS — F17210 Nicotine dependence, cigarettes, uncomplicated: Secondary | ICD-10-CM | POA: Insufficient documentation

## 2022-04-21 DIAGNOSIS — Z86711 Personal history of pulmonary embolism: Secondary | ICD-10-CM | POA: Insufficient documentation

## 2022-04-21 DIAGNOSIS — Z7901 Long term (current) use of anticoagulants: Secondary | ICD-10-CM | POA: Diagnosis not present

## 2022-04-21 DIAGNOSIS — F4312 Post-traumatic stress disorder, chronic: Secondary | ICD-10-CM | POA: Diagnosis not present

## 2022-04-21 DIAGNOSIS — E1165 Type 2 diabetes mellitus with hyperglycemia: Secondary | ICD-10-CM | POA: Insufficient documentation

## 2022-04-21 DIAGNOSIS — I10 Essential (primary) hypertension: Secondary | ICD-10-CM | POA: Diagnosis present

## 2022-04-21 DIAGNOSIS — E1149 Type 2 diabetes mellitus with other diabetic neurological complication: Secondary | ICD-10-CM | POA: Diagnosis not present

## 2022-04-21 MED ORDER — NICOTINE 14 MG/24HR TD PT24
14.0000 mg | MEDICATED_PATCH | Freq: Every day | TRANSDERMAL | 2 refills | Status: DC
  Filled 2022-04-21: qty 28, 28d supply, fill #0

## 2022-04-21 MED ORDER — TRAMADOL HCL 50 MG PO TABS
50.0000 mg | ORAL_TABLET | Freq: Two times a day (BID) | ORAL | 2 refills | Status: DC | PRN
  Filled 2022-04-21: qty 60, 30d supply, fill #0
  Filled 2022-05-17: qty 60, 30d supply, fill #1
  Filled 2022-06-15: qty 60, 30d supply, fill #2

## 2022-04-21 NOTE — Progress Notes (Signed)
Tramadol refill

## 2022-04-21 NOTE — Patient Instructions (Addendum)
Instead of a milkshake, consider making an alternative at home-  Blend 1 small frozen banana, cocoa powder, and 1 cup low fat milk in blender  Consider having a sandwich for lunch rather than poptarts as able.  Great job on being more active and taking your medication consistently!  Great job on avoiding beverages with sugar!

## 2022-04-21 NOTE — Progress Notes (Unsigned)
Diabetes Self-Management Education  Visit Type: Follow-up  Appt. Start Time: 1445 Appt. End Time: 1620  04/22/2022  [name], identified by name and date of birth, is a 41 y.o. male with a diagnosis of Diabetes:  .   ASSESSMENT Patient is here today with his girlfriend.  263 lbs.  He has lost 15 lbs since 11/2021.  Is this weight loss from Polycythemia vs. Lifestyle factors/food insecurity?  Follow up about this further on future visits. Now avoiding sugar containing beverages.  Dislikes water and drinks occasionally with diet tea flavor, drinks 6-7 cans of diet soda per day. "If I eat too much sugar on the Jardiance, I get a severe stomach ache and diarrhea." Not taking Novolog.  States that he refuses as he does not want to stick himself again. Fasting glucose about 250.  Usually 450-500 so has improved. Not ready to quit smoking yet. They have a new dog.  This has reduced his stressed and made him more active.  He takes the dog out about every 3 hours and takes a 15 minute walk most days.  He has increased leg pain if he walks more. His girlfriend stopped his gym membership as he is not going. He is getting tested for OSA this week. Complains of extreme fatigue and does not want to get up until 4-5 pm at times. Sleep is broken and is awake every 3 hours.  History includes Type 2 Diabetes (2019), DVT, Bipolar, ADHD, neuropathy, smoking, memory issues, polycythemia, thrombocytopenia, history of DVT/PE Labs noted to include:  A1C 12.7% 08/16/2021 and 12/19/2021, 13.6% 08/09/2020 increased from 13.2% 05/03/2020, cholesterol 193, Triglycerides 365, HDL 40, LDL 92 08/09/2020 Medications include:  Trulicity (not taking due to expense), glipizide (discontinued), actos (discontinued), Tresiba 50 units q HS, Novolog before meals (not taking), coumadin, Abilify and other medications that contribute to weight gain. He stopped pioglitizone after 3 days due to stomach cramps. He could not  tolerated Metformin due to bowel incontinence.    Sleep:  Eats late (10pm - midnight).  Goes to sleep late.  Wakes about 10:30 am (although later now at this time. Support:  [place], patient of Dr. Kelton Pillar at [facility], PCP Charlott Rakes, MD   Weight: 263 lbs 04/21/2022 278 lbs 12/19/2021 279 lbs 10/24/2021 282 lbs 08/16/2021 loss likely due to uncontrolled blood glucose 295 lbs 02/14/21 306 lbs 47/2022 306 lbs 11/23/2020 425 lbs highest adult weight 2 years ago (loss presumably due to uncontrolled blood sugar) Lowest adult weight 235 lbs about 8 years ago and he does not recall circumstances of increased weight gain.   Patient lives with his girlfriend.  He does the shopping and cooking. He is on disability.  Finances are a concern. His mother passed away 03/6194 from complications of diabetes. Girlfriend  goes to the gym 2-3 times per week [facility]).  Patient is not motivated to go. Complains of neuropathy pain which worsens with exercise. Weight 263 lb (119.3 kg). Body mass index is 35.67 kg/m.   Diabetes Self-Management Education - 04/22/22 1600       Visit Information   Visit Type Follow-up      Health Coping   How would you rate your overall health? Good      Psychosocial Assessment   Patient Belief/Attitude about Diabetes Motivated to manage diabetes    What is the hardest part about your diabetes right now, causing you the most concern, or is the most worrisome to you  about your diabetes?   Taking/obtaining medications    Self-care barriers Lack of material resources    Self-management support Doctor's office;Friends    Other persons present Patient;Spouse/SO    Patient Concerns Nutrition/Meal planning;Glycemic Control;Problem Solving;Weight Control      Pre-Education Assessment   Patient understands the diabetes disease and treatment process. Needs Review    Patient understands incorporating nutritional  management into lifestyle. Needs Review    Patient undertands incorporating physical activity into lifestyle. Needs Review    Patient understands using medications safely. Needs Review    Patient understands monitoring blood glucose, interpreting and using results Needs Review    Patient understands prevention, detection, and treatment of acute complications. Needs Review    Patient understands prevention, detection, and treatment of chronic complications. Needs Review    Patient understands how to develop strategies to address psychosocial issues. Needs Review    Patient understands how to develop strategies to promote health/change behavior. Needs Review      Complications   How often do you check your blood sugar? 1-2 times/day    Fasting Blood glucose range (mg/dL) >200   256 thid sm [id number] gtom 400     Dietary Intake   Breakfast 1-2 packages of poptarts    Lunch [employer] Mc[employer]icken with extra mayo, medium fries    Dinner BBQ chicken adn mac and cheese    Beverage(s) 4-6 diet soda per day      Activity / Exercise   Activity / Exercise Type Light (walking / raking leaves)   with new dog     Patient Education   Previous Diabetes Education Yes (please comment)    Healthy Eating Meal options for control of blood glucose level and chronic complications.;Information on hints to eating out and maintain blood glucose control.    Being Active Role of exercise on diabetes management, blood pressure control and cardiac health.    Medications Reviewed patients medication for diabetes, action, purpose, timing of dose and side effects.    Monitoring Taught/evaluated SMBG meter.    Chronic complications Relationship between chronic complications and blood glucose control    Lifestyle and Health Coping Review risk of smoking and offered smoking cessation      Individualized Goals (developed by patient)   Nutrition General guidelines for healthy choices and portions discussed    Physical  Activity Exercise 3-5 times per week;30 minutes per day    Medications take my medication as prescribed    Monitoring  Test my blood glucose as discussed    Problem Solving Eating Pattern;Sleep Pattern;Addressing barriers to behavior change    Reducing Risk stop smoking    Health Coping Ask for help with psychological, social, or emotional issues      Patient Self-Evaluation of Goals - Patient rates self as meeting previously set goals (% of time)   Nutrition 25 - 50% (sometimes)    Physical Activity 50 - 75 % (half of the time)    Medications 25 - 50% (sometimes)    Monitoring 25 - 50% (sometimes)    Problem Solving and behavior change strategies  25 - 50% (sometimes)    Reducing Risk (treating acute and chronic complications) < 82% (hardly ever/never)    Health Coping 25 - 50% (sometimes)      Post-Education Assessment   Patient understands the diabetes disease and treatment process. Needs Review    Patient understands incorporating nutritional management into lifestyle. Needs Review    Patient undertands incorporating physical activity into lifestyle.  Needs Review    Patient understands using medications safely. Needs Review    Patient understands monitoring blood glucose, interpreting and using results Needs Review    Patient understands prevention, detection, and treatment of acute complications. Needs Review    Patient understands prevention, detection, and treatment of chronic complications. Needs Review    Patient understands how to develop strategies to address psychosocial issues. Needs Review    Patient understands how to develop strategies to promote health/change behavior. Needs Review      Outcomes   Expected Outcomes Demonstrated interest in learning but significant barriers to change    Future DMSE 4-6 wks    Program Status Not Completed      Subsequent Visit   Since your last visit have you continued or begun to take your medications as prescribed? Yes    Since your  last visit have you had your blood pressure checked? No    Since your last visit have you experienced any weight changes? Loss    Weight Loss (lbs) 15    Since your last visit, are you checking your blood glucose at least once a day? Yes             Individualized Plan for Diabetes Self-Management Training:   Learning Objective:  Patient will have a greater understanding of diabetes self-management. Patient education plan is to attend individual and/or group sessions per assessed needs and concerns.   Plan:   Patient Instructions  Instead of a milkshake, consider making an alternative at home-  Blend 1 small frozen banana, cocoa powder, and 1 cup low fat milk in blender  Consider having a sandwich for lunch rather than poptarts as able.  Great job on being more active and taking your medication consistently!  Great job on avoiding beverages with sugar!   Expected Outcomes:  Demonstrated interest in learning but significant barriers to change  Education material provided:   If problems or questions, patient to contact team via:  Phone  Future DSME appointment: 4-6 wks

## 2022-04-21 NOTE — Patient Instructions (Signed)

## 2022-04-21 NOTE — Progress Notes (Signed)
Subjective:  Patient ID: Bradley [name], male    DOB: 1981-06-23  Age: 41 y.o. MRN: [id number]  CC: Hypertension   HPI Bradley [name] is a 41 y.o. year old male with a history of morbid obesity, type 2 diabetes mellitus (A1c 12.7 managed by endocrine, non compliance,diabetic neuropathy (unable to tolerate gabapentin, Lyrica), Bipolar disorder, previous DVT in 2017, PE (diagnosed in 10/2018 - on chronic anticoagulation with Coumadin).  Interval History:  He remains on anticoagulation with Coumadin and last INR values have been: Lab Results  Component Value Date   INR 2.7 03/13/2022   INR 2.6 02/06/2022   INR 2.6 01/06/2022   Diabetes is managed by endocrine and he informs me his sugar stays at mid 200s regardless of what he does.  He has lost about 15 pounds in the last 6 months and states weight loss is unintentional.  He has an upcoming appointment with endocrine.  Remains on tramadol for diabetic neuropathy and this controls his symptoms.  Last dose of tramadol was this morning.  He is also on Cymbalta. He is also followed by behavioral health and is on psychotropic medications. Still smokes 1 ppd and tried quitting a few weeks ago but was unsuccessful. Denies additional concerns today.  Past Medical History:  Diagnosis Date   ADHD    ADHD (attention deficit hyperactivity disorder)    Bipolar 1 disorder (HCC)    Bipolar disorder (HCC)    Diabetes mellitus without complication (HCC)    Hyperlipidemia    Morbid obesity (HCC)    Obesity    Panic attack    Varicose veins of both lower extremities with pain     Past Surgical History:  Procedure Laterality Date   surgery on meatus as a child      Family History  Problem Relation Age of Onset   Rheum arthritis Mother    Diabetes Mother    Heart failure Mother    Stroke Mother    Glaucoma Maternal Grandmother    Lung cancer Paternal Grandmother        smoker    Social History   Socioeconomic History   Marital  status: Single    Spouse name: Not on file   Number of children: Not on file   Years of education: Not on file   Highest education level: Not on file  Occupational History   Not on file  Tobacco Use   Smoking status: Every Day    Packs/day: 1.00    Types: Cigarettes   Smokeless tobacco: Never  Vaping Use   Vaping Use: Never used  Substance and Sexual Activity   Alcohol use: Yes    Comment: Rarely   Drug use: No   Sexual activity: Not on file  Other Topics Concern   Not on file  Social History Narrative   ** Merged History Encounter **       Social Determinants of Health   Financial Resource Strain: Not on file  Food Insecurity: Not on file  Transportation Needs: Not on file  Physical Activity: Not on file  Stress: Not on file  Social Connections: Not on file    Allergies  Allergen Reactions   Actos [Pioglitazone] Other (See Comments)    Stomach cramps   Gabapentin Other (See Comments)    Crying spells   Lyrica [Pregabalin] Other (See Comments)    Makes the patient somnolent    Outpatient Medications Prior to Visit  Medication Sig Dispense Refill  ARIPiprazole (ABILIFY) 5 MG tablet Take 1 tablet (5 mg total) by mouth at bedtime. 30 tablet 2   ARIPiprazole (ABILIFY) 5 MG tablet Take 1 tablet(s) by mouth at bedtime for mood 30 tablet 2   atorvastatin (LIPITOR) 20 MG tablet Take 1 tablet by mouth daily. 30 tablet 3   Blood Glucose Calibration (ACCU-CHEK ACTIVE GLUCOSE CONT VI) by In Vitro route.     busPIRone (BUSPAR) 7.5 MG tablet Take 1 tablet(s) by mouth twice a day for severe anxiety/panic attacks (Patient taking differently: 7.5 mg. Takes as needed) 60 tablet 2   doxepin (SINEQUAN) 50 MG capsule Take 1 capsule (50 mg total) by mouth as needed for sleep 30 capsule 2   doxepin (SINEQUAN) 50 MG capsule Take 1 capsule(s) by mouth at bedtime as needed for sleep 30 capsule 2   DULoxetine (CYMBALTA) 30 MG capsule Take 1 capsule (30 mg total) by mouth every morning. 30  capsule 2   DULoxetine (CYMBALTA) 30 MG capsule Take 1 capsule(s) by mouth daily for anxiety/depression 30 capsule 2   DULoxetine (CYMBALTA) 60 MG capsule Take 1 capsule(s) by mouth at bedtime for anxiety/depression 30 capsule 2   empagliflozin (JARDIANCE) 10 MG TABS tablet Take 1 tablet (10 mg total) by mouth daily before breakfast. 90 tablet 2   glucose blood (ACCU-CHEK GUIDE) test strip 1 each by Other route 3 (three) times daily. Use as instructed 300 each 3   insulin aspart (NOVOLOG FLEXPEN) 100 UNIT/ML FlexPen Inject 8 Units into the skin 3 (three) times daily with meals. 30 mL 3   insulin degludec (TRESIBA FLEXTOUCH) 100 UNIT/ML FlexTouch Pen Inject 45 Units into the skin daily. 45 mL 3   Insulin Pen Needle 31G X 5 MM MISC Use as directed 3 times daily. 300 each 3   Menthol 7.5 % PTCH Apply 1 patch topically as needed (pain).     mupirocin ointment (BACTROBAN) 2 % Apply to right great toe once daily. 30 g 1   OneTouch Delica Lancets 33G MISC 1 Device by Does not apply route 3 (three) times daily. 300 each 3   sildenafil (VIAGRA) 50 MG tablet Take 1 tablet (50 mg total) by mouth daily as needed for erectile dysfunction. At least 24 hours between doses 10 tablet 1   warfarin (COUMADIN) 2.5 MG tablet TAKE 1 TABLET BY MOUTH DAILY AS DIRECTED BY THE COUMADIN CLINIC. 30 tablet 2   traMADol (ULTRAM) 50 MG tablet Take 1 tablet (50 mg total) by mouth every 12 (twelve) hours as needed. 60 tablet 2   albuterol (VENTOLIN HFA) 108 (90 Base) MCG/ACT inhaler INHALE 1-2 PUFFS INTO THE LUNGS EVERY 6 (SIX) HOURS AS NEEDED FOR WHEEZING OR SHORTNESS OF BREATH. 18 g 2   No facility-administered medications prior to visit.     ROS Review of Systems  Constitutional:  Negative for activity change and appetite change.  HENT:  Negative for sinus pressure and sore throat.   Respiratory:  Negative for chest tightness, shortness of breath and wheezing.   Cardiovascular:  Negative for chest pain and palpitations.   Gastrointestinal:  Negative for abdominal distention, abdominal pain and constipation.  Genitourinary: Negative.   Musculoskeletal: Negative.   Psychiatric/Behavioral:  Negative for behavioral problems and dysphoric mood.     Objective:  BP 100/66   Pulse (!) 104   Temp 97.8 F (36.6 C) (Oral)   Ht 6' (1.829 m)   Wt 263 lb 9.6 oz (119.6 kg)   SpO2 97%   BMI  35.75 kg/m      04/21/2022    1:39 PM 03/19/2022    2:35 PM 02/06/2022    2:34 PM  BP/Weight  Systolic BP 100 92 83  Diastolic BP 66 79 56  Wt. (Lbs) 263.6 265 270  BMI 35.75 kg/m2 35.94 kg/m2 36.62 kg/m2      Physical Exam Constitutional:      Appearance: He is well-developed. He is obese.  Cardiovascular:     Rate and Rhythm: Normal rate.     Heart sounds: Normal heart sounds. No murmur heard. Pulmonary:     Effort: Pulmonary effort is normal.     Breath sounds: Normal breath sounds. No wheezing or rales.  Chest:     Chest wall: No tenderness.  Abdominal:     General: Bowel sounds are normal. There is no distension.     Palpations: Abdomen is soft. There is no mass.     Tenderness: There is no abdominal tenderness.  Musculoskeletal:        General: Normal range of motion.     Right lower leg: No edema.     Left lower leg: No edema.  Skin:    Comments: Hyperpigmentation in left anterior shin  Neurological:     Mental Status: He is alert and oriented to person, place, and time.  Psychiatric:        Mood and Affect: Mood normal.        Latest Ref Rng & Units 03/19/2022    2:21 PM 09/17/2021    2:40 PM 03/19/2021   12:28 PM  CMP  Glucose 70 - 99 mg/dL 062  694  854   BUN 6 - 20 mg/dL 14  14  11    Creatinine 0.61 - 1.24 mg/dL  6.27  0.35   Sodium 135 - 145 mmol/L 137  136  133   Potassium 3.5 - 5.1 mmol/L 4.6  4.3  4.4   Chloride 98 - 111 mmol/L 101  99  97   CO2 22 - 32 mmol/L 25  29  27    Calcium 8.9 - 10.3 mg/dL 8.9  9.3  9.0   Total Protein 6.5 - 8.1 g/dL 7.7  7.0  7.3   Total Bilirubin  0.3 - 1.2 mg/dL 1.2  1.1  0.6   Alkaline Phos 38 - 126 U/L 70  78  83   AST 15 - 41 U/L 20  16  20    ALT 0 - 44 U/L 29  22  33     Lipid Panel     Component Value Date/Time   CHOL 185 12/06/2020 1456   TRIG 236 (H) 12/06/2020 1456   HDL 47 12/06/2020 1456   CHOLHDL 3.9 12/06/2020 1456   CHOLHDL 4.0 11/12/2016 1613   VLDL 28 11/12/2016 1613   LDLCALC 98 12/06/2020 1456    CBC    Component Value Date/Time   WBC 10.1 03/19/2022 1421   WBC 9.5 04/19/2020 2357   RBC 6.84 (H) 03/19/2022 1421   HGB 19.5 (H) 03/19/2022 1421   HGB 18.9 (H) 12/06/2020 1456   HCT 56.6 (H) 03/19/2022 1421   HCT 57.0 (H) 12/06/2020 1456   PLT 114 (L) 03/19/2022 1421   PLT 120 (L) 12/06/2020 1456   MCV 82.7 03/19/2022 1421   MCV 84 12/06/2020 1456   MCH 28.5 03/19/2022 1421   MCHC 34.5 03/19/2022 1421   RDW 13.7 03/19/2022 1421   RDW 13.1 12/06/2020 1456   LYMPHSABS 2.0 03/19/2022 1421  LYMPHSABS 2.0 12/06/2020 1456   MONOABS 0.6 03/19/2022 1421   EOSABS 0.2 03/19/2022 1421   EOSABS 0.1 12/06/2020 1456   BASOSABS 0.1 03/19/2022 1421   BASOSABS 0.1 12/06/2020 1456    Lab Results  Component Value Date   HGBA1C 12.7 (A) 12/19/2021    Assessment & Plan:  1. Other diabetic neurological complication associated with type 2 diabetes mellitus (HCC) Uncontrolled with A1c of 12.7; goal is less than 7.0 He does have an upcoming appointment with endocrine For neuropathy he is under a pain contract and is on tramadol Continue Cymbalta as well for neuropathy - traMADol (ULTRAM) 50 MG tablet; Take 1 tablet (50 mg total) by mouth every 12 (twelve) hours as needed.  Dispense: 60 tablet; Refill: 2 - LP+Non-HDL Cholesterol; Future  2. Cigarette nicotine dependence without complication Spent 3 minutes counseling on smoking cessation and he is willing to try nicotine patches and work on quitting - nicotine (NICODERM CQ) 14 mg/24hr patch; Place 1 patch (14 mg total) onto the skin daily. Then decrease to 7 mg  daily  Dispense: 28 patch; Refill: 2  3. Morbid obesity (HCC) He has lost 15 pounds in the last 6 months Hyperglycemic state could also be contributing Continue to work on caloric restriction and exercise  4. Hypertriglyceridemia We will check lipid panel and adjust statin accordingly Low-cholesterol diet    Meds ordered this encounter  Medications   traMADol (ULTRAM) 50 MG tablet    Sig: Take 1 tablet (50 mg total) by mouth every 12 (twelve) hours as needed.    Dispense:  60 tablet    Refill:  2   nicotine (NICODERM CQ) 14 mg/24hr patch    Sig: Place 1 patch (14 mg total) onto the skin daily. Then decrease to 7 mg daily    Dispense:  28 patch    Refill:  2    Follow-up: Return in about 3 months (around 07/22/2022) for Chronic medical conditions.       Hoy Register, MD, FAAFP. [facility] [place], [place] [phone]   04/21/2022, 2:23 PM

## 2022-04-22 ENCOUNTER — Ambulatory Visit: Payer: Medicare Other | Attending: Family Medicine | Admitting: Pharmacist

## 2022-04-22 ENCOUNTER — Ambulatory Visit: Payer: Medicare Other

## 2022-04-22 DIAGNOSIS — I2699 Other pulmonary embolism without acute cor pulmonale: Secondary | ICD-10-CM | POA: Diagnosis not present

## 2022-04-22 DIAGNOSIS — E1149 Type 2 diabetes mellitus with other diabetic neurological complication: Secondary | ICD-10-CM

## 2022-04-22 DIAGNOSIS — I825Y2 Chronic embolism and thrombosis of unspecified deep veins of left proximal lower extremity: Secondary | ICD-10-CM

## 2022-04-22 LAB — POCT INR: INR: 2.4 (ref 2.0–3.0)

## 2022-04-23 ENCOUNTER — Other Ambulatory Visit: Payer: Self-pay

## 2022-04-23 ENCOUNTER — Encounter: Payer: Self-pay | Admitting: Internal Medicine

## 2022-04-23 ENCOUNTER — Ambulatory Visit (INDEPENDENT_AMBULATORY_CARE_PROVIDER_SITE_OTHER): Payer: Medicare Other | Admitting: Internal Medicine

## 2022-04-23 VITALS — BP 100/68 | HR 100 | Ht 72.0 in | Wt 263.4 lb

## 2022-04-23 DIAGNOSIS — Z794 Long term (current) use of insulin: Secondary | ICD-10-CM

## 2022-04-23 DIAGNOSIS — E1169 Type 2 diabetes mellitus with other specified complication: Secondary | ICD-10-CM

## 2022-04-23 DIAGNOSIS — E1165 Type 2 diabetes mellitus with hyperglycemia: Secondary | ICD-10-CM

## 2022-04-23 DIAGNOSIS — E781 Pure hyperglyceridemia: Secondary | ICD-10-CM | POA: Diagnosis not present

## 2022-04-23 LAB — LP+NON-HDL CHOLESTEROL
Cholesterol, Total: 114 mg/dL (ref 100–199)
HDL: 41 mg/dL (ref >39)
LDL Chol Calc (NIH): 49 mg/dL (ref 0–99)
Total Non-HDL-Chol (LDL+VLDL): 73 mg/dL (ref 0–129)
Triglycerides: 140 mg/dL (ref 0–149)
VLDL Cholesterol Cal: 24 mg/dL (ref 5–40)

## 2022-04-23 LAB — POCT GLUCOSE (DEVICE FOR HOME USE): POC Glucose: 378 mg/dl — AB (ref 70–99)

## 2022-04-23 LAB — POCT GLYCOSYLATED HEMOGLOBIN (HGB A1C): Hemoglobin A1C: 9.8 % — AB (ref 4.0–5.6)

## 2022-04-23 MED ORDER — EMPAGLIFLOZIN 25 MG PO TABS
25.0000 mg | ORAL_TABLET | Freq: Every day | ORAL | 3 refills | Status: DC
  Filled 2022-04-23 – 2022-05-20 (×2): qty 90, 90d supply, fill #0
  Filled 2022-08-15: qty 90, 90d supply, fill #1

## 2022-04-23 MED ORDER — TRESIBA FLEXTOUCH 100 UNIT/ML ~~LOC~~ SOPN
60.0000 [IU] | PEN_INJECTOR | Freq: Every day | SUBCUTANEOUS | 3 refills | Status: DC
  Filled 2022-04-23: qty 54, 90d supply, fill #0
  Filled 2022-07-21: qty 45, 75d supply, fill #0

## 2022-04-23 NOTE — Patient Instructions (Addendum)
-   Increase  Jardiance 25 mg, 1 tablet daily  - Increase Tresiba 60 units once daily      HOW TO TREAT LOW BLOOD SUGARS (Blood sugar LESS THAN 70 MG/DL) Please follow the RULE OF 15 for the treatment of hypoglycemia treatment (when your (blood sugars are less than 70 mg/dL)   STEP 1: Take 15 grams of carbohydrates when your blood sugar is low, which includes:  3-4 GLUCOSE TABS  OR 3-4 OZ OF JUICE OR REGULAR SODA OR ONE TUBE OF GLUCOSE GEL    STEP 2: RECHECK blood sugar in 15 MINUTES STEP 3: If your blood sugar is still low at the 15 minute recheck --> then, go back to STEP 1 and treat AGAIN with another 15 grams of carbohydrates.

## 2022-04-23 NOTE — Progress Notes (Signed)
Name: Bradley [name]  Age/ Sex: 41 y.o., male   MRN/ DOB: [id number], 10-Aug-1981     PCP: Charlott Rakes, MD   Reason for Endocrinology Evaluation: Type 2 Diabetes Mellitus  Initial Endocrine Consultative Visit: 10/18/2020    PATIENT IDENTIFIER: Bradley [name] is a 41 y.o. male with a past medical history of T2Dm, DVT and bipolar disorder. The patient has followed with Endocrinology clinic since 10/18/2020 for consultative assistance with management of his diabetes.  DIABETIC HISTORY:  Mr. Sapia was diagnosed with DM yrs ago. Basaglar made him feel sluggish. His hemoglobin A1c has ranged from 7.7% in 2019, peaking at 14.9% in 2021.   On his initial visit to our clinic his A1c was 13.6 %, he DECLINED insulin. He was on Glipizide and Trulicity, we increased Glipizide, Trulicity and added Pioglitazone. By the time he went  To see our RD in 12/2020 he was off pioglitazone  Due to abdominal cramps    Stopped Glipizide and started basal insulin 0/1779  We stop Trulicity by March 3903 as he was not taking it and he would forget to take it, we will start him on Jardiance instead as his daily tablet to improve compliance  Lives with girlfriend    He is disabled due to mental health      SUBJECTIVE:   During the last visit (12/19/2021): 12.7 % he was not taking once weekly Trulicity injections nor his NovoLog.  We increased his Tyler Aas, continue NovoLog and start him on Jardiance and will stop Trulicity  Today (0/05/2329): Mr. Nusz is here for a follow up on diabetes management. .   He has not been checking his glucose at home.    He met with our RD on 04/17/2022, he stated to her that he does not take NovoLog because he refuses to stick himself multiple times a day Saw his PCP 04/17/2022 Continues to follow-up with podiatry with the last visit on 02/18/2022  Has occasional stomach pain and diarrhea     HOME DIABETES REGIMEN:  Jardiance 10 mg daily Tresiba 50 units  daily Novolog 8 units TIDQAC - not taking     Statin: no ACE-I/ARB: no Prior Diabetic Education: yes   METER DOWNLOAD SUMMARY: Did not bring    DIABETIC COMPLICATIONS: Microvascular complications:  neuropathy Denies: CKD retinopathy Last Eye Exam: Completed 05/21/2021  Macrovascular complications:   Denies: CAD, CVA, PVD   HISTORY:  Past Medical History:  Past Medical History:  Diagnosis Date   ADHD    ADHD (attention deficit hyperactivity disorder)    Bipolar 1 disorder ([place])    Bipolar disorder ([place])    Diabetes mellitus without complication ([place])    Hyperlipidemia    Morbid obesity ([place])    Obesity    Panic attack    Varicose veins of both lower extremities with pain    Past Surgical History:  Past Surgical History:  Procedure Laterality Date   surgery on meatus as a child     Social History:  reports that he has been smoking cigarettes. He has been smoking an average of 1 pack per day. He has never used smokeless tobacco. He reports current alcohol use. He reports that he does not use drugs. Family History:  Family History  Problem Relation Age of Onset   Rheum arthritis Mother    Diabetes Mother    Heart failure Mother    Stroke Mother    Glaucoma Maternal Grandmother    Lung cancer Paternal Grandmother  smoker     HOME MEDICATIONS: Allergies as of 04/23/2022       Reactions   Actos [pioglitazone] Other (See Comments)   Stomach cramps   Gabapentin Other (See Comments)   Crying spells   Lyrica [pregabalin] Other (See Comments)   Makes the patient somnolent        Medication List        Accurate as of April 23, 2022  2:51 PM. If you have any questions, ask your nurse or doctor.          ACCU-CHEK ACTIVE GLUCOSE CONT VI by In Vitro route.   Accu-Chek Guide test strip Generic drug: glucose blood 1 each by Other route 3 (three) times daily. Use as instructed   albuterol 108 (90 Base) MCG/ACT inhaler Commonly known as:  VENTOLIN HFA INHALE 1-2 PUFFS INTO THE LUNGS EVERY 6 (SIX) HOURS AS NEEDED FOR WHEEZING OR SHORTNESS OF BREATH.   ARIPiprazole 5 MG tablet Commonly known as: ABILIFY Take 1 tablet (5 mg total) by mouth at bedtime.   ARIPiprazole 5 MG tablet Commonly known as: Abilify Take 1 tablet(s) by mouth at bedtime for mood   atorvastatin 20 MG tablet Commonly known as: LIPITOR Take 1 tablet by mouth daily.   B-D UF III MINI PEN NEEDLES 31G X 5 MM Misc Generic drug: Insulin Pen Needle Use as directed 3 times daily.   busPIRone 7.5 MG tablet Commonly known as: BUSPAR Take 1 tablet(s) by mouth twice a day for severe anxiety/panic attacks What changed:  how much to take additional instructions   doxepin 50 MG capsule Commonly known as: SINEQUAN Take 1 capsule (50 mg total) by mouth as needed for sleep   doxepin 50 MG capsule Commonly known as: SINEQUAN Take 1 capsule(s) by mouth at bedtime as needed for sleep   DULoxetine 30 MG capsule Commonly known as: CYMBALTA Take 1 capsule (30 mg total) by mouth every morning.   DULoxetine 30 MG capsule Commonly known as: CYMBALTA Take 1 capsule(s) by mouth daily for anxiety/depression   DULoxetine 60 MG capsule Commonly known as: CYMBALTA Take 1 capsule(s) by mouth at bedtime for anxiety/depression   Jardiance 10 MG Tabs tablet Generic drug: empagliflozin Take 1 tablet (10 mg total) by mouth daily before breakfast.   Menthol (Topical Analgesic) 7.5 % Ptch Apply 1 patch topically as needed (pain).   mupirocin ointment 2 % Commonly known as: BACTROBAN Apply to right great toe once daily.   nicotine 14 mg/24hr patch Commonly known as: Nicoderm CQ Place 1 patch (14 mg total) onto the skin daily. Then decrease to 7 mg daily   NovoLOG FlexPen 100 UNIT/ML FlexPen Generic drug: insulin aspart Inject 8 Units into the skin 3 (three) times daily with meals.   OneTouch Delica Lancets 34K Misc 1 Device by Does not apply route 3 (three)  times daily.   sildenafil 50 MG tablet Commonly known as: Viagra Take 1 tablet (50 mg total) by mouth daily as needed for erectile dysfunction. At least 24 hours between doses   traMADol 50 MG tablet Commonly known as: ULTRAM Take 1 tablet (50 mg total) by mouth every 12 (twelve) hours as needed.   Tyler Aas FlexTouch 100 UNIT/ML FlexTouch Pen Generic drug: insulin degludec Inject 45 Units into the skin daily. What changed: how much to take   warfarin 2.5 MG tablet Commonly known as: COUMADIN Take as directed by the anticoagulation clinic. If you are unsure how to take this medication, talk to your nurse  or doctor. Original instructions: TAKE 1 TABLET BY MOUTH DAILY AS DIRECTED BY THE COUMADIN CLINIC.         OBJECTIVE:   Vital Signs: BP 100/68 (BP Location: Left Arm, Patient Position: Sitting, Cuff Size: Small)   Pulse 100   Ht 6' (1.829 m)   Wt 263 lb 6.4 oz (119.5 kg)   SpO2 99%   BMI 35.72 kg/m   Wt Readings from Last 3 Encounters:  04/23/22 263 lb 6.4 oz (119.5 kg)  04/22/22 263 lb (119.3 kg)  04/21/22 263 lb 9.6 oz (119.6 kg)     Exam: General: Pt appears well and is in NAD  Lungs: Clear with good BS bilat with no rales, rhonchi, or wheezes  Heart: RRR with normal S1 and S2 and no gallops; no murmurs; no rub  Abdomen: Normoactive bowel sounds, soft, nontender, without masses or organomegaly palpable  Extremities: Trace pretibial edema.  Stasis dermatitis over the shins  Neuro: MS is good with appropriate affect, pt is alert and Ox3     DM foot exam: 12/19/2021   The skin of the feet is without sores or ulcerations, had a right great toe discoloration  The pedal pulses are 2+ on right and 2+ on left. The sensation is intact to a screening 5.07, 10 gram monofilament bilaterally    DATA REVIEWED:  Lab Results  Component Value Date   HGBA1C 9.8 (A) 04/23/2022   HGBA1C 12.7 (A) 12/19/2021   HGBA1C 12.7 (A) 08/16/2021     Latest Reference Range & Units  03/19/22 14:21  Sodium 135 - 145 mmol/L 137  Potassium 3.5 - 5.1 mmol/L 4.6  Chloride 98 - 111 mmol/L 101  CO2 22 - 32 mmol/L 25  Glucose 70 - 99 mg/dL 241 (H)  BUN 6 - 20 mg/dL 14  Creatinine 0.61 - 1.24 mg/dL 0.91  Calcium 8.9 - 10.3 mg/dL 8.9  Anion gap 5 - 15  11  Alkaline Phosphatase 38 - 126 U/L 70  Albumin 3.5 - 5.0 g/dL 4.1  AST 15 - 41 U/L 20  ALT 0 - 44 U/L 29  Total Protein 6.5 - 8.1 g/dL 7.7  Total Bilirubin 0.3 - 1.2 mg/dL 1.2  GFR, Est Non African American >60 mL/min >60    Latest Reference Range & Units 04/22/22 14:49  Cholesterol, Total 100 - 199 mg/dL 114  HDL Cholesterol >39 mg/dL 41  Total Non-HDL-Chol (LDL+VLDL) 0 - 129 mg/dL 73  Triglycerides 0 - 149 mg/dL 140  VLDL Cholesterol Cal 5 - 40 mg/dL 24  LDL Chol Calc (NIH) 0 - 99 mg/dL 49      ASSESSMENT / PLAN / RECOMMENDATIONS:   1) Type 2 Diabetes Mellitus, Poorly controlled, With Neuropathic  complications - Most recent A1c of 9.8%. Goal A1c < 7.0 %.   -A1c trended down from 12.7 to 9.8 %  -I have praised him on avoiding sugar sweetened beverages -He was not able to take Trulicity as it was once weekly injection and he would forget to take it he also has chronic GI issues and GLP-1 agonist may not be a good option at this time -He is tolerating Jardiance, will increase the dose as below -He refuses to use NovoLog, he is under the impression that he does not need it.  I initially thought that the barrier is the multiple daily injection, so I offered CeQUR but he declines -I will increase his insulin as well   MEDICATIONS:   Increase Jardiance 25 mg daily  Increase Tresiba 60 units once daily    EDUCATION / INSTRUCTIONS: BG monitoring instructions: Patient is instructed to check his blood sugars 3 times a day, before meals  Call [facility] if: BG persistently < 70  I reviewed the Rule of 15 for the treatment of hypoglycemia in detail with the patient. Literature supplied.    2)  Diabetic complications:  Eye: Does not have known diabetic retinopathy.  Neuro/ Feet: Does  have known diabetic peripheral neuropathy  Renal: Patient does not have known baseline CKD. He   is not on an ACEI/ARB at present.   3) Hypertriglyceridemia:  -Historically he has had elevated triglycerides up to 367 mg/DL in 2021, this has trended down to 236 in 2022, repeat triglycerides has normalized in July 2023 at 140 mg/DL -The patient has been doing well with lifestyle changes  F/U in 4 months  Signed electronically by: Mack Guise, MD  [facility] End[facility]  [address] [place], [place] [address] Phone: [phone] FAX: [phone]   CC: Charlott Rakes, MD [place] [address] Phone: [phone]  Fax: [phone]  Return to Endocrinology clinic as below: Future Appointments  Date Time Provider [place]  04/25/2022  8:00 PM Deneise Lever, MD [facility] MSD  05/21/2022  1:00 PM Bernarda Caffey, MD [facility] None  05/26/2022  3:30 PM Tresa Endo, RPH-CPP [facility] None  05/27/2022  3:30 PM Marzetta Board, DPM [facility]  07/21/2022  2:00 PM Clydell Hakim, RD [place] NDM  08/04/2022  2:10 PM Charlott Rakes, MD [facility] None  09/17/2022  2:30 PM [facility] LAB [facility] None  09/17/2022  3:00 PM Lincoln Brigham, PA-C [facility] None

## 2022-04-24 ENCOUNTER — Other Ambulatory Visit: Payer: Self-pay

## 2022-04-25 ENCOUNTER — Ambulatory Visit (HOSPITAL_BASED_OUTPATIENT_CLINIC_OR_DEPARTMENT_OTHER): Payer: Medicare Other | Attending: Physician Assistant | Admitting: Internal Medicine

## 2022-04-25 DIAGNOSIS — R0683 Snoring: Secondary | ICD-10-CM | POA: Diagnosis not present

## 2022-04-25 DIAGNOSIS — G479 Sleep disorder, unspecified: Secondary | ICD-10-CM | POA: Diagnosis not present

## 2022-04-25 DIAGNOSIS — D751 Secondary polycythemia: Secondary | ICD-10-CM | POA: Insufficient documentation

## 2022-05-01 ENCOUNTER — Other Ambulatory Visit: Payer: Self-pay

## 2022-05-03 DIAGNOSIS — D751 Secondary polycythemia: Secondary | ICD-10-CM

## 2022-05-03 NOTE — Procedures (Signed)
    Patient Name: Bradley [name], Bradley [name] Date: 04/25/2022 Gender: Male D.O.B: Jun 23, 1981 Age (years): 40 Referring Provider: Briant Cedar PA-C Height (inches): 72 Interpreting Physician: Jetty Duhamel MD, ABSM Weight (lbs): 263 RPSGT: Armen Pickup BMI: 36 MRN: [id number] Neck Size: 16.50  CLINICAL INFORMATION Sleep Study Type: NPSG Indication for sleep study: Non-refreshing Sleep, Obesity Epworth Sleepiness Score: 3  SLEEP STUDY TECHNIQUE As per the AASM Manual for the Scoring of Sleep and Associated Events v2.3 (April 2016) with a hypopnea requiring 4% desaturations.  The channels recorded and monitored were frontal, central and occipital EEG, electrooculogram (EOG), submentalis EMG (chin), nasal and oral airflow, thoracic and abdominal wall motion, anterior tibialis EMG, snore microphone, electrocardiogram, and pulse oximetry.  MEDICATIONS Medications self-administered by patient taken the night of the study : WARFARIN, ULTRAM, CYMBALTA, DOXEPIN, LIPITOR, ABILIFY  SLEEP ARCHITECTURE The study was initiated at 10:14:24 PM and ended at 4:21:45 AM.  Sleep onset time was 39.9 minutes and the sleep efficiency was 73.9%%. The total sleep time was 271.5 minutes.  Stage REM latency was N/A minutes.  The patient spent 7.0%% of the night in stage N1 sleep, 88.2%% in stage N2 sleep, 4.8%% in stage N3 and 0% in REM.  Alpha intrusion was absent.  Supine sleep was 0.00%.  RESPIRATORY PARAMETERS The overall apnea/hypopnea index (AHI) was 2.2 per hour. There were 1 total apneas, including 1 obstructive, 0 central and 0 mixed apneas. There were 9 hypopneas and 5 RERAs.  The AHI during Stage REM sleep was N/A per hour.  AHI while supine was N/A per hour.  The mean oxygen saturation was 90.5%. The minimum SpO2 during sleep was 88.0%.  soft snoring was noted during this study.  CARDIAC DATA The 2 lead EKG demonstrated sinus rhythm. The mean heart rate was 77.2 beats per minute.  Other EKG findings include: None.  LEG MOVEMENT DATA The total PLMS were 0 with a resulting PLMS index of 0.0. Associated arousal with leg movement index was 8.0 .  IMPRESSIONS - No significant obstructive sleep apnea occurred during this study (AHI = 2.2/h). - The patient had minimal or no oxygen desaturation during the study (Min O2 = 88.0%) Mean 90.5%. Time with O2 saturation 88% or less 1.8 minutes. - The patient snored with soft snoring volume. - No cardiac abnormalities were noted during this study. - Limb movements total 295 (65.2/ hr). Limb movements with arousal or awakening 36 (8/ hr).  DIAGNOSIS - Limb Movement Sleep Disorder  RECOMMENDATIONS - Consider trial of Mirapex, Requip, or Sinemet for treatment of Periodic Leg Movements of Sleep. - Be careful with alcohol, sedatives and other CNS depressants that may worsen sleep apnea and disrupt normal sleep architecture. - Sleep hygiene should be reviewed to assess factors that may improve sleep quality. - Weight management and regular exercise should be initiated or continued if appropriate.  [Electronically signed] 05/03/2022 10:44 AM  Jetty Duhamel MD, ABSM Diplomate, American Board of Sleep Medicine NPI: [id number]                         Jetty Duhamel Diplomate, American Board of Sleep Medicine  ELECTRONICALLY SIGNED ON:  05/03/2022, 10:37 AM [facility] PH: [phone]   FX: [phone] ACCREDITED BY THE AMERICAN ACADEMY OF SLEEP MEDICINE

## 2022-05-05 ENCOUNTER — Other Ambulatory Visit: Payer: Self-pay

## 2022-05-06 ENCOUNTER — Other Ambulatory Visit: Payer: Self-pay

## 2022-05-13 NOTE — Progress Notes (Signed)
[facility] Note  05/21/2022     CHIEF COMPLAINT Patient presents for Retina Follow Up    HISTORY OF PRESENT ILLNESS: Bradley [name] is a 41 y.o. male who presents to the clinic today for:   HPI     Retina Follow Up   Patient presents with  Other.  In both eyes.  Severity is moderate.  Duration of 12 months.  Since onset it is stable.  I, the attending physician,  performed the HPI with the patient and updated documentation appropriately.        Comments   Pt here for 12 mo ret f/u DM exam. Pt states VA is the same, most recent A1c was 9.8 which he reports was down from 12.7.       Last edited by Bernarda Caffey, MD on 05/21/2022  1:57 PM.    Pt states he has lost 100lbs since last year, he is on Jardiance, which has been "amazing", he says his blood sugar this morning was 256, which is better for him  Referring physician: Charlott Rakes, MD [place],   [id number]  HISTORICAL INFORMATION:   Selected notes from the [place]: No current outpatient medications on file. (Ophthalmic Drugs)   No current facility-administered medications for this visit. (Ophthalmic Drugs)   Current Outpatient Medications (Other)  Medication Sig   ARIPiprazole (ABILIFY) 5 MG tablet Take 1 tablet (5 mg total) by mouth at bedtime.   ARIPiprazole (ABILIFY) 5 MG tablet Take 1 tablet(s) by mouth at bedtime for mood   atorvastatin (LIPITOR) 20 MG tablet Take 1 tablet by mouth daily.   Blood Glucose Calibration (ACCU-CHEK ACTIVE GLUCOSE CONT VI) by In Vitro route.   busPIRone (BUSPAR) 7.5 MG tablet Take 1 tablet(s) by mouth twice a day for severe anxiety/panic attacks (Patient taking differently: 7.5 mg. Takes as needed)   doxepin (SINEQUAN) 50 MG capsule Take 1 capsule (50 mg total) by mouth as needed for sleep   doxepin (SINEQUAN) 50 MG capsule Take 1 capsule(s) by mouth at bedtime as needed for  sleep   DULoxetine (CYMBALTA) 30 MG capsule Take 1 capsule (30 mg total) by mouth every morning.   DULoxetine (CYMBALTA) 30 MG capsule Take 1 capsule(s) by mouth daily for anxiety/depression   DULoxetine (CYMBALTA) 60 MG capsule Take 1 capsule(s) by mouth at bedtime for anxiety/depression   empagliflozin (JARDIANCE) 25 MG TABS tablet Take 1 tablet (25 mg total) by mouth daily before breakfast.   glucose blood (ACCU-CHEK GUIDE) test strip 1 each by Other route 3 (three) times daily. Use as instructed   insulin degludec (TRESIBA FLEXTOUCH) 100 UNIT/ML FlexTouch Pen Inject 60 Units into the skin daily.   Insulin Pen Needle 31G X 5 MM MISC Use as directed 3 times daily.   Menthol 7.5 % PTCH Apply 1 patch topically as needed (pain).   mupirocin ointment (BACTROBAN) 2 % Apply to right great toe once daily.   OneTouch Delica Lancets 82U MISC 1 Device by Does not apply route 3 (three) times daily.   sildenafil (VIAGRA) 50 MG tablet Take 1 tablet (50 mg total) by mouth daily as needed for erectile dysfunction. At least 24 hours between doses   traMADol (ULTRAM) 50 MG tablet Take 1 tablet (50 mg total) by mouth every 12 (twelve) hours as needed.   warfarin (COUMADIN) 2.5 MG tablet TAKE 1 TABLET BY MOUTH DAILY AS DIRECTED BY  THE COUMADIN CLINIC.   albuterol (VENTOLIN HFA) 108 (90 Base) MCG/ACT inhaler INHALE 1-2 PUFFS INTO THE LUNGS EVERY 6 (SIX) HOURS AS NEEDED FOR WHEEZING OR SHORTNESS OF BREATH.   nicotine (NICODERM CQ) 14 mg/24hr patch Place 1 patch (14 mg total) onto the skin daily. Then decrease to 7 mg daily (Patient not taking: Reported on 05/21/2022)   No current facility-administered medications for this visit. (Other)   REVIEW OF SYSTEMS: ROS   Positive for: Endocrine, Eyes, Respiratory, Psychiatric Negative for: Constitutional, Gastrointestinal, Neurological, Skin, Genitourinary, Musculoskeletal, HENT, Cardiovascular, Allergic/Imm, Heme/Lymph Last edited by Kingsley Spittle, COT on  05/21/2022  1:22 PM.     ALLERGIES Allergies  Allergen Reactions   Actos [Pioglitazone] Other (See Comments)    Stomach cramps   Gabapentin Other (See Comments)    Crying spells   Lyrica [Pregabalin] Other (See Comments)    Makes the patient somnolent   PAST MEDICAL HISTORY Past Medical History:  Diagnosis Date   ADHD    ADHD (attention deficit hyperactivity disorder)    Bipolar 1 disorder ([place])    Bipolar disorder ([place])    Diabetes mellitus without complication ([place])    Hyperlipidemia    Morbid obesity ([place])    Obesity    Panic attack    Varicose veins of both lower extremities with pain    Past Surgical History:  Procedure Laterality Date   surgery on meatus as a child     FAMILY HISTORY Family History  Problem Relation Age of Onset   Rheum arthritis Mother    Diabetes Mother    Heart failure Mother    Stroke Mother    Glaucoma Maternal Grandmother    Lung cancer Paternal Grandmother        smoker   SOCIAL HISTORY Social History   Tobacco Use   Smoking status: Every Day    Packs/day: 1.00    Types: Cigarettes   Smokeless tobacco: Never  Vaping Use   Vaping Use: Never used  Substance Use Topics   Alcohol use: Yes    Comment: Rarely   Drug use: No       OPHTHALMIC EXAM:  Base Eye Exam     Visual Acuity (Snellen - Linear)       Right Left   Dist [place] 20/30 -2 20/60   Dist ph [place] 20/25 -2 20/25         Tonometry (Tonopen, 1:28 PM)       Right Left   Pressure 13 16         Pupils       Dark Light Shape React APD   Right 5 4 Round Brisk None   Left 5 4 Round Brisk None         Visual Fields (Counting fingers)       Left Right    Full Full         Extraocular Movement       Right Left    Full, Ortho Full, Ortho         Neuro/Psych     Oriented x3: Yes   Mood/Affect: Normal         Dilation     Both eyes: 1.0% Mydriacyl, 2.5% Phenylephrine @ 1:29 PM           Slit Lamp and Fundus Exam     Slit Lamp Exam        Right Left   Lids/Lashes Normal Normal   Conjunctiva/Sclera White and  quiet White and quiet   Cornea 1+Punctate epithelial erosions, dry tear film, mild tear film debris Trace Punctate epithelial erosions, tear film debris   Anterior Chamber Deep and quiet Deep and quiet   Iris Round and dilated, No NVI Round and dilated, No NVI   Lens Clear Clear   Anterior Vitreous mild syneresis mild syneresis         Fundus Exam       Right Left   Disc Pink and Sharp Pink and Sharp, mild tilt, mild PPA, Compact   C/D Ratio 0.2 0.1   Macula Flat, Good foveal reflex, No heme or edema Flat, Good foveal reflex, mild Retinal pigment epithelial mottling, No heme or edema   Vessels Tortuous, mild copper wiring attenuated, Tortuous   Periphery Attached, no heme Attached, no heme            IMAGING AND PROCEDURES  Imaging and Procedures for $RemoveBefore'@TODAY'[id number]$ @  OCT, Retina - OU - Both Eyes       Right Eye Quality was good. Central Foveal Thickness: 266. Progression has been stable. Findings include normal foveal contour, no IRF, no SRF.   Left Eye Quality was good. Central Foveal Thickness: 255. Progression has been stable. Findings include normal foveal contour, no IRF, no SRF.   Notes *Images captured and stored on drive  Diagnosis / Impression:  NFP, no IRF/SRF OU No DME OU  Clinical management:  See below  Abbreviations: NFP - Normal foveal profile. CME - cystoid macular edema. PED - pigment epithelial detachment. IRF - intraretinal fluid. SRF - subretinal fluid. EZ - ellipsoid zone. ERM - epiretinal membrane. ORA - outer retinal atrophy. ORT - outer retinal tubulation. SRHM - subretinal hyper-reflective material            ASSESSMENT/PLAN:    ICD-10-CM   1. Diabetes mellitus type 2 without retinopathy ([place])  E11.9     2. Essential hypertension  I10     3. Hypertensive retinopathy of both eyes  H35.033 OCT, Retina - OU - Both Eyes     1. Diabetes mellitus, type 2 without  retinopathy  - HbA1c 9.8 on 07.26.23, 12.7 on 07.18.22  - pt reports he has lost 100 lbs since last visit  - The incidence, risk factors for progression, natural history and treatment options for diabetic retinopathy  were discussed with patient.    - The need for close monitoring of blood glucose, blood pressure, and serum lipids, avoiding cigarette or any type of tobacco, and the need for long term follow up was also discussed with patient.  - f/u in 1 year, sooner prn, DFE, OCT  2,3. Hypertensive retinopathy OU  - discussed importance of tight BP control  - history of PE and DVT -- on coumadin  - monitor  Ophthalmic Meds Ordered this visit:  No orders of the defined types were placed in this encounter.    Return in about 1 year (around 05/22/2023) for DM exam, DFE, OCT.  There are no Patient Instructions on file for this visit.  This document serves as a record of services personally performed by Gardiner Sleeper, MD, PhD. It was created on their behalf by Orvan Falconer, an ophthalmic technician. The creation of this record is the provider's dictation and/or activities during the visit.    Electronically signed by: Orvan Falconer, OA, 05/21/22  1:58 PM  This document serves as a record of services personally performed by Gardiner Sleeper, MD, PhD. It was created on their  behalf by San Jetty. Owens Shark, OA an ophthalmic technician. The creation of this record is the provider's dictation and/or activities during the visit.    Electronically signed by: San Jetty. Owens Shark, [place] 08.23.2023 1:58 PM  Gardiner Sleeper, M.D., Ph.D. Diseases & Surgery of the Retina and Vitreous [facility]  I have reviewed the above documentation for accuracy and completeness, and I agree with the above. Gardiner Sleeper, M.D., Ph.D. 05/21/22 2:00 PM   Abbreviations: M myopia (nearsighted); A astigmatism; H hyperopia (farsighted); P presbyopia; Mrx spectacle prescription;  CTL contact  lenses; OD right eye; OS left eye; OU both eyes  XT exotropia; ET esotropia; PEK punctate epithelial keratitis; PEE punctate epithelial erosions; DES dry eye syndrome; MGD meibomian gland dysfunction; ATs artificial tears; PFAT's preservative free artificial tears; [place] nuclear sclerotic cataract; PSC posterior subcapsular cataract; ERM epi-retinal membrane; PVD posterior vitreous detachment; RD retinal detachment; DM diabetes mellitus; DR diabetic retinopathy; NPDR non-proliferative diabetic retinopathy; PDR proliferative diabetic retinopathy; CSME clinically significant macular edema; DME diabetic macular edema; dbh dot blot hemorrhages; CWS cotton wool spot; POAG primary open angle glaucoma; C/D cup-to-disc ratio; HVF humphrey visual field; GVF goldmann visual field; OCT optical coherence tomography; IOP intraocular pressure; BRVO Branch retinal vein occlusion; CRVO central retinal vein occlusion; CRAO central retinal artery occlusion; BRAO branch retinal artery occlusion; RT retinal tear; SB scleral buckle; PPV pars plana vitrectomy; VH Vitreous hemorrhage; PRP panretinal laser photocoagulation; IVK intravitreal kenalog; VMT vitreomacular traction; MH Macular hole;  NVD neovascularization of the disc; NVE neovascularization elsewhere; AREDS age related eye disease study; ARMD age related macular degeneration; POAG primary open angle glaucoma; EBMD epithelial/anterior basement membrane dystrophy; ACIOL anterior chamber intraocular lens; IOL intraocular lens; PCIOL posterior chamber intraocular lens; Phaco/IOL phacoemulsification with intraocular lens placement; [place] photorefractive keratectomy; LASIK laser assisted in situ keratomileusis; HTN hypertension; DM diabetes mellitus; COPD chronic obstructive pulmonary disease

## 2022-05-19 ENCOUNTER — Other Ambulatory Visit: Payer: Self-pay

## 2022-05-20 ENCOUNTER — Other Ambulatory Visit: Payer: Self-pay

## 2022-05-21 ENCOUNTER — Encounter (INDEPENDENT_AMBULATORY_CARE_PROVIDER_SITE_OTHER): Payer: Self-pay | Admitting: Ophthalmology

## 2022-05-21 ENCOUNTER — Ambulatory Visit (INDEPENDENT_AMBULATORY_CARE_PROVIDER_SITE_OTHER): Payer: Medicare Other | Admitting: Ophthalmology

## 2022-05-21 DIAGNOSIS — E119 Type 2 diabetes mellitus without complications: Secondary | ICD-10-CM

## 2022-05-21 DIAGNOSIS — H35033 Hypertensive retinopathy, bilateral: Secondary | ICD-10-CM | POA: Diagnosis not present

## 2022-05-21 DIAGNOSIS — I10 Essential (primary) hypertension: Secondary | ICD-10-CM

## 2022-05-21 LAB — HM DIABETES EYE EXAM

## 2022-05-26 ENCOUNTER — Ambulatory Visit: Payer: Medicare Other | Attending: Family Medicine | Admitting: Pharmacist

## 2022-05-26 DIAGNOSIS — I2699 Other pulmonary embolism without acute cor pulmonale: Secondary | ICD-10-CM | POA: Diagnosis not present

## 2022-05-26 DIAGNOSIS — I825Y2 Chronic embolism and thrombosis of unspecified deep veins of left proximal lower extremity: Secondary | ICD-10-CM

## 2022-05-26 LAB — POCT INR: INR: 2.4 (ref 2.0–3.0)

## 2022-05-26 NOTE — Progress Notes (Deleted)
No missed doses. No bleeding. No dietary changes. 2.4

## 2022-05-27 ENCOUNTER — Ambulatory Visit: Payer: Medicare Other | Admitting: Podiatry

## 2022-06-04 ENCOUNTER — Other Ambulatory Visit: Payer: Self-pay

## 2022-06-16 ENCOUNTER — Other Ambulatory Visit: Payer: Self-pay

## 2022-06-19 ENCOUNTER — Other Ambulatory Visit: Payer: Self-pay | Admitting: Family Medicine

## 2022-06-20 ENCOUNTER — Other Ambulatory Visit: Payer: Self-pay

## 2022-06-20 ENCOUNTER — Encounter: Payer: Self-pay | Admitting: Podiatry

## 2022-06-20 ENCOUNTER — Ambulatory Visit (INDEPENDENT_AMBULATORY_CARE_PROVIDER_SITE_OTHER): Payer: Medicare Other | Admitting: Podiatry

## 2022-06-20 DIAGNOSIS — M79675 Pain in left toe(s): Secondary | ICD-10-CM | POA: Diagnosis not present

## 2022-06-20 DIAGNOSIS — B351 Tinea unguium: Secondary | ICD-10-CM | POA: Diagnosis not present

## 2022-06-20 DIAGNOSIS — M79674 Pain in right toe(s): Secondary | ICD-10-CM

## 2022-06-20 DIAGNOSIS — E1142 Type 2 diabetes mellitus with diabetic polyneuropathy: Secondary | ICD-10-CM

## 2022-06-20 MED ORDER — WARFARIN SODIUM 2.5 MG PO TABS
2.5000 mg | ORAL_TABLET | Freq: Every day | ORAL | 1 refills | Status: DC
  Filled 2022-06-20: qty 90, 90d supply, fill #0
  Filled 2022-09-18: qty 90, 90d supply, fill #1

## 2022-06-20 NOTE — Telephone Encounter (Signed)
Requested medication (s) are due for refill today:   Provider to review  Requested medication (s) are on the active medication list:   Yes  Future visit scheduled:   Yes   Last ordered: 03/13/2022 #30, 2 refills  Returned per protocol to be reviewed by provider/coumadin clinic   Requested Prescriptions  Pending Prescriptions Disp Refills   warfarin (COUMADIN) 2.5 MG tablet 30 tablet 2    Sig: TAKE 1 TABLET BY MOUTH DAILY AS DIRECTED BY THE COUMADIN CLINIC.     Hematology:  Anticoagulants - warfarin Failed - 06/19/2022  6:59 PM      Failed - Manual Review: If patient's warfarin is managed by Anti-Coag team, route request to them. If not, route request to the provider.      Failed - HCT in normal range and within 360 days    HCT  Date Value Ref Range Status  03/19/2022 56.6 (H) 39.0 - 52.0 % Final   Hematocrit  Date Value Ref Range Status  12/06/2020 57.0 (H) 37.5 - 51.0 % Final         Passed - INR in normal range and within 30 days    INR  Date Value Ref Range Status  05/26/2022 2.4 2.0 - 3.0 Final  04/19/2020 2.3 (H) 0.8 - 1.2 Final    Comment:    (NOTE) INR goal varies based on device and disease states. Performed at [facility], [place] [address], [place], [place] [address]          Passed - Patient is not pregnant      Passed - Valid encounter within last 3 months    Recent Outpatient Visits           2 months ago Other diabetic neurological complication associated with type 2 diabetes mellitus ([place])   [place], [place], MD   8 months ago Other diabetic neurological complication associated with type 2 diabetes mellitus ([place])   [place] Charlott Rakes, MD   10 months ago Hypertriglyceridemia   [place], Charlane Ferretti, MD   1 year ago Type 2 diabetes mellitus with hyperglycemia, with long-term current use of insulin ([place])   [place], Charlane Ferretti, MD   1 year ago Type 2 diabetes mellitus with hyperglycemia, with long-term current use of insulin [facility])   [place], [place], MD       Future Appointments             In 1 week Daisy Blossom, Jarome Matin, [place]   In 1 month Charlott Rakes, MD [place]

## 2022-06-27 NOTE — Progress Notes (Signed)
Subjective:  Patient ID: Bradley [name], male    DOB: 02-24-1981,  MRN: [id number]  Bradley [name] presents to clinic today for at risk foot care with history of diabetic neuropathy and callus(es) right lower extremity and painful thick toenails that are difficult to trim. Painful toenails interfere with ambulation. Aggravating factors include wearing enclosed shoe gear. Pain is relieved with periodic professional debridement. Painful calluses are aggravated when weightbearing with and without shoegear. Pain is relieved with periodic professional debridement.  Patient states blood glucose was 272 mg/dl today.  Last known  HgA1c was 9.8%.  He sees Dr. Kelton Pillar in Endocrinology.  New problem(s): None.   Patient states he cannot afford to have callus trimmed. 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  PCP is Charlott Rakes, MD , and last visit was  April 21, 2022.  Allergies  Allergen Reactions   Actos [Pioglitazone] Other (See Comments)    Stomach cramps   Gabapentin Other (See Comments)    Crying spells   Lyrica [Pregabalin] Other (See Comments)    Makes the patient somnolent    Review of Systems: Negative except as noted in the HPI.  Objective: No changes noted in today's physical examination.  Bradley [name] is a pleasant 41 y.o. male obese in NAD. AAO x 3. Vascular Capillary refill time to digits immediate b/l. Palpable DP pulse(s) b/l lower extremities Palpable PT pulse(s) b/l lower extremities Pedal hair absent. Lower extremity skin temperature gradient within normal limits. No pain with calf compression b/l. Nonpitting edema noted b/l lower extremities. Varicosities present b/l. No cyanosis or clubbing noted. He has hyperpigmentation consistent with venous stasis/chronic venous insufficiency b/l LE.  Neurologic Normal speech. Oriented to person, place, and time. Pt has subjective symptoms of neuropathy.  Protective sensation decreased with 10 gram monofilament b/l lower extremities. Vibratory sensation intact b/l.  Dermatologic Toenails 2-5 bilaterally and L hallux elongated, discolored, dystrophic, thickened, and crumbly with subungual debris and tenderness to dorsal palpation. Pincer nail deformity L hallux and R hallux. No erythema, no edema, no drainage, no fluctuance. Plantar aspect of patient's feet soiled consistent with walking barefoot.  Orthopedic: Normal muscle strength 5/5 to all lower extremity muscle groups bilaterally. No gross bony deformities b/l lower extremities. Patient ambulates independent of any assistive aids.      Radiographs: None Assessment/Plan: 1. Pain due to onychomycosis of toenails of both feet   2. Diabetic peripheral neuropathy associated with type 2 diabetes mellitus ([place])     No orders of the defined types were placed in this encounter.  -Patient was evaluated and treated. All patient's and/or POA's questions/concerns answered on today's visit. -Discussed pedal hygiene and the importance of keeping feet clean. Discussed the dangers of walking barefoot, however, patient states he will continue to do so because he is walking in his home. -Stressed the importance of good glycemic control and the detriment of not  controlling glucose levels in relation to the foot. -Patient to continue soft, supportive shoe gear daily. -Toenails 1-5 b/l were debrided in length and girth with sterile nail nippers only without iatrogenic bleeding.  -As a courtesy, callus(es) submet head 5 right foot pared utilizing sterile scalpel blade without complication or incident. Total number pared=1. We have developed a plan for him to keep his callus filed down with a pumice stone after his bath/shower. He states he will go to [place] to get one. Again, I have  stressed the dangers of him walking barefoot, but he has not been compliant in this regard. -Patient/POA to call should there be  question/concern in the interim.   Return in about 3 months (around 09/19/2022).  Freddie Breech, DPM

## 2022-06-30 ENCOUNTER — Ambulatory Visit: Payer: Medicare Other | Attending: Family Medicine | Admitting: Pharmacist

## 2022-06-30 ENCOUNTER — Other Ambulatory Visit: Payer: Self-pay

## 2022-06-30 DIAGNOSIS — E781 Pure hyperglyceridemia: Secondary | ICD-10-CM

## 2022-06-30 DIAGNOSIS — I825Y2 Chronic embolism and thrombosis of unspecified deep veins of left proximal lower extremity: Secondary | ICD-10-CM

## 2022-06-30 DIAGNOSIS — E1149 Type 2 diabetes mellitus with other diabetic neurological complication: Secondary | ICD-10-CM

## 2022-06-30 DIAGNOSIS — I2699 Other pulmonary embolism without acute cor pulmonale: Secondary | ICD-10-CM

## 2022-06-30 LAB — POCT INR
INR: 2.2 (ref 2.0–3.0)
INR: 2.2 (ref 2.0–3.0)

## 2022-06-30 MED ORDER — ATORVASTATIN CALCIUM 20 MG PO TABS
20.0000 mg | ORAL_TABLET | Freq: Every day | ORAL | 1 refills | Status: DC
  Filled 2022-06-30 – 2022-07-07 (×2): qty 90, 90d supply, fill #0
  Filled 2022-09-29: qty 90, 90d supply, fill #1

## 2022-07-07 ENCOUNTER — Other Ambulatory Visit: Payer: Self-pay

## 2022-07-10 ENCOUNTER — Other Ambulatory Visit: Payer: Self-pay

## 2022-07-10 ENCOUNTER — Other Ambulatory Visit: Payer: Self-pay | Admitting: Pharmacist

## 2022-07-10 DIAGNOSIS — E1149 Type 2 diabetes mellitus with other diabetic neurological complication: Secondary | ICD-10-CM

## 2022-07-10 MED ORDER — TRAMADOL HCL 50 MG PO TABS
50.0000 mg | ORAL_TABLET | Freq: Two times a day (BID) | ORAL | 2 refills | Status: DC | PRN
  Filled 2022-07-10 – 2022-07-14 (×2): qty 60, 30d supply, fill #0
  Filled 2022-08-11: qty 60, 30d supply, fill #1
  Filled 2022-09-08: qty 60, 30d supply, fill #2

## 2022-07-10 NOTE — Telephone Encounter (Signed)
Patient called requesting tramadol refills. I cannot approve these. I have pended an refill order and will forward to his PCP.

## 2022-07-14 ENCOUNTER — Other Ambulatory Visit: Payer: Self-pay

## 2022-07-14 DIAGNOSIS — F4312 Post-traumatic stress disorder, chronic: Secondary | ICD-10-CM | POA: Diagnosis not present

## 2022-07-14 DIAGNOSIS — F3132 Bipolar disorder, current episode depressed, moderate: Secondary | ICD-10-CM | POA: Diagnosis not present

## 2022-07-14 DIAGNOSIS — F411 Generalized anxiety disorder: Secondary | ICD-10-CM | POA: Diagnosis not present

## 2022-07-21 ENCOUNTER — Other Ambulatory Visit: Payer: Self-pay

## 2022-07-21 ENCOUNTER — Encounter: Payer: Medicare Other | Attending: Internal Medicine | Admitting: Dietician

## 2022-07-21 ENCOUNTER — Other Ambulatory Visit: Payer: Self-pay | Admitting: Internal Medicine

## 2022-07-21 ENCOUNTER — Encounter: Payer: Self-pay | Admitting: Dietician

## 2022-07-21 DIAGNOSIS — E1165 Type 2 diabetes mellitus with hyperglycemia: Secondary | ICD-10-CM | POA: Insufficient documentation

## 2022-07-21 DIAGNOSIS — Z794 Long term (current) use of insulin: Secondary | ICD-10-CM | POA: Diagnosis not present

## 2022-07-21 MED ORDER — BD PEN NEEDLE MINI U/F 31G X 5 MM MISC
1.0000 | Freq: Three times a day (TID) | 3 refills | Status: DC
  Filled 2022-07-21: qty 200, 66d supply, fill #0

## 2022-07-21 NOTE — Progress Notes (Signed)
Diabetes Self-Management Education  Visit Type: Follow-up  Appt. Start Time: 1400 Appt. End Time: 2440  07/21/2022  [name], identified by name and date of birth, is a 41 y.o. male with a diagnosis of Diabetes:  .   ASSESSMENT Patient is here today with his girlfriend.  She has just been diagnosed with Type 2 Diabetes.  They were last seen in our office 04/21/2022. Has not taken the [place] for the past 3 months.  "I don't want to stick myself."  He is taking the Jardiance. Fasting blood glucose was 326 this am and is usually in this range. Blood glucose 202 (30 minutes) after lunch today.   History includes Type 2 Diabetes (2019), DVT, Bipolar, ADHD, neuropathy, smoking, memory issues, polycythemia, thrombocytopenia, history of DVT/PE Labs noted to include:  A1C 12.7% 08/16/2021 and 12/19/2021, 13.6% 08/09/2020 increased from 13.2% 05/03/2020, cholesterol 193, Triglycerides 365, HDL 40, LDL 92 08/09/2020 Medications include:  Trulicity (not taking due to expense), glipizide (discontinued), actos (discontinued), Jardiance, Tresiba 60 units q HS, Novolog before meals (not taking), coumadin, Abilify and other medications that contribute to weight gain. He stopped pioglitizone after 3 days due to stomach cramps. He could not tolerated Metformin due to bowel incontinence.    Sleep:  Eats late (10pm - midnight).  Goes to sleep late.  Wakes about 10:30 am (although later now) Support:  [place], patient of Dr. Kelton Pillar at [facility], PCP Charlott Rakes, MD   Weight: 261 lbs 07/21/2022 263 lbs 04/21/2022 278 lbs 12/19/2021 279 lbs 10/24/2021 282 lbs 08/16/2021 loss likely due to uncontrolled blood glucose 295 lbs 02/14/21 306 lbs 47/2022 306 lbs 11/23/2020 425 lbs highest adult weight 2 years ago (loss presumably due to uncontrolled blood sugar) Lowest adult weight 235 lbs about 8 years ago and he does not recall  circumstances of increased weight gain.   Patient lives with his girlfriend.  She has type 2 diabetes.  He does the shopping and cooking.  Dislikes most vegetables. He is on disability.  Finances are a concern. His mother passed away 09/270 from complications of diabetes. Girlfriend  goes to the gym 2-3 times per week [facility]).  Patient is not motivated to go. Complains of neuropathy pain which worsens with exercise.  Weight 261 lb (118.4 kg). Body mass index is 35.4 kg/m.   Diabetes Self-Management Education - 07/21/22 1455       Visit Information   Visit Type Follow-up      Psychosocial Assessment   What is the hardest part about your diabetes right now, causing you the most concern, or is the most worrisome to you about your diabetes?   Making healty food and beverage choices;Taking/obtaining medications;Being active    Self-care barriers Lack of material resources    Self-management support Doctor's office;Friends;Family    Other persons present Patient    Patient Concerns Nutrition/Meal planning;Problem Solving;Glycemic Control    Special Needs None    Preferred Learning Style No preference indicated    Learning Readiness Ready      Pre-Education Assessment   Patient understands the diabetes disease and treatment process. Needs Review    Patient understands incorporating nutritional management into lifestyle. Needs Review    Patient undertands incorporating physical activity into lifestyle. Needs Review    Patient understands using medications safely. Needs Review    Patient understands monitoring blood glucose, interpreting and using results Needs Review    Patient understands prevention, detection, and treatment of acute complications.  Needs Review    Patient understands prevention, detection, and treatment of chronic complications. Needs Review    Patient understands how to develop strategies to address psychosocial issues. Needs Review    Patient understands how to develop  strategies to promote health/change behavior. Needs Review      Complications   Fasting Blood glucose range (mg/dL) >200    Postprandial Blood glucose range (mg/dL) >200    Have you had a dilated eye exam in the past 12 months? Yes      Dietary Intake   Breakfast none    Lunch 2 McDouble burgers and 1/2 cup regular soda    Dinner pan seared pork chop, knor creamy chicken rice OR pork steak, pasta salad, frozen corn on the cob with butter    Snack (evening) cookies or doritos    Beverage(s) diet Mt. Dew, Zero Pepsi, occasional diet sweet tea, occasional regular soda, no water      Activity / Exercise   Activity / Exercise Type Light (walking / raking leaves)    How many days per week do you exercise? 1    How many minutes per day do you exercise? 20    Total minutes per week of exercise 20      Patient Education   Previous Diabetes Education Yes (please comment)   03/2022   Healthy Eating Meal options for control of blood glucose level and chronic complications.    Being Active Role of exercise on diabetes management, blood pressure control and cardiac health.    Medications Reviewed patients medication for diabetes, action, purpose, timing of dose and side effects.;Taught/reviewed insulin/injectables, injection, site rotation, insulin/injectables storage and needle disposal.    Monitoring Identified appropriate SMBG and/or A1C goals.;Yearly dilated eye exam    Acute complications Taught prevention, symptoms, and  treatment of hypoglycemia - the 15 rule.    Chronic complications Relationship between chronic complications and blood glucose control;Identified and discussed with patient  current chronic complications    Lifestyle and Health Coping Lifestyle issues that need to be addressed for better diabetes care      Individualized Goals (developed by patient)   Nutrition General guidelines for healthy choices and portions discussed    Physical Activity Exercise 3-5 times per week;30  minutes per day    Medications take my medication as prescribed    Monitoring  Test my blood glucose as discussed    Problem Solving Eating Pattern;Addressing barriers to behavior change      Patient Self-Evaluation of Goals - Patient rates self as meeting previously set goals (% of time)   Nutrition 25 - 50% (sometimes)    Physical Activity < 25% (hardly ever/never)    Medications 25 - 50% (sometimes)    Monitoring 25 - 50% (sometimes)    Problem Solving and behavior change strategies  < 25% (hardly ever/never)    Reducing Risk (treating acute and chronic complications) < 123456 (hardly ever/never)    Health Coping 25 - 50% (sometimes)      Post-Education Assessment   Patient understands the diabetes disease and treatment process. Needs Review    Patient understands incorporating nutritional management into lifestyle. Needs Review    Patient undertands incorporating physical activity into lifestyle. Comprehends key points    Patient understands using medications safely. Needs Review    Patient understands monitoring blood glucose, interpreting and using results Comprehends key points    Patient understands prevention, detection, and treatment of acute complications. Comprehends key points  Patient understands prevention, detection, and treatment of chronic complications. Comprehends key points    Patient understands how to develop strategies to address psychosocial issues. Needs Review    Patient understands how to develop strategies to promote health/change behavior. Needs Review      Outcomes   Expected Outcomes Demonstrated interest in learning but significant barriers to change    Future DMSE 2 months    Program Status Not Completed      Subsequent Visit   Since your last visit have you continued or begun to take your medications as prescribed? No    Since your last visit have you experienced any weight changes? Loss    Weight Loss (lbs) 2             Individualized Plan  for Diabetes Self-Management Training:   Learning Objective:  Patient will have a greater understanding of diabetes self-management. Patient education plan is to attend individual and/or group sessions per assessed needs and concerns.   Plan:   Patient Instructions  Start taking your [place] A[place] again.  Try spots other than your stomach if this feels better.  (Tops of thighs, top of hip) Keep taking the Jardiance.  Aim for beverages without sugar. Continue to bake rather than fry.  Expected Outcomes:  Demonstrated interest in learning but significant barriers to change  Education material provided:   If problems or questions, patient to contact team via:  Phone  Future DSME appointment: 2 months

## 2022-07-21 NOTE — Patient Instructions (Addendum)
Start taking your Bradley [name] again.  Try spots other than your stomach if this feels better.  (Tops of thighs, top of hip) Keep taking the Jardiance.  Aim for beverages without sugar. Continue to bake rather than fry.

## 2022-07-22 ENCOUNTER — Other Ambulatory Visit: Payer: Self-pay

## 2022-07-31 ENCOUNTER — Ambulatory Visit: Payer: Medicare Other | Attending: Family Medicine | Admitting: Pharmacist

## 2022-07-31 DIAGNOSIS — I2699 Other pulmonary embolism without acute cor pulmonale: Secondary | ICD-10-CM | POA: Diagnosis not present

## 2022-07-31 DIAGNOSIS — I825Y2 Chronic embolism and thrombosis of unspecified deep veins of left proximal lower extremity: Secondary | ICD-10-CM

## 2022-07-31 LAB — POCT INR: INR: 2.2 (ref 2.0–3.0)

## 2022-08-04 ENCOUNTER — Other Ambulatory Visit: Payer: Self-pay

## 2022-08-04 ENCOUNTER — Ambulatory Visit: Payer: Medicare Other | Attending: Family Medicine | Admitting: Family Medicine

## 2022-08-04 ENCOUNTER — Encounter: Payer: Self-pay | Admitting: Family Medicine

## 2022-08-04 VITALS — BP 120/79 | HR 110 | Temp 98.3°F | Ht 72.0 in | Wt 257.2 lb

## 2022-08-04 DIAGNOSIS — I825Y2 Chronic embolism and thrombosis of unspecified deep veins of left proximal lower extremity: Secondary | ICD-10-CM | POA: Insufficient documentation

## 2022-08-04 DIAGNOSIS — F17210 Nicotine dependence, cigarettes, uncomplicated: Secondary | ICD-10-CM | POA: Diagnosis not present

## 2022-08-04 DIAGNOSIS — Z713 Dietary counseling and surveillance: Secondary | ICD-10-CM | POA: Insufficient documentation

## 2022-08-04 DIAGNOSIS — F3162 Bipolar disorder, current episode mixed, moderate: Secondary | ICD-10-CM

## 2022-08-04 DIAGNOSIS — E1165 Type 2 diabetes mellitus with hyperglycemia: Secondary | ICD-10-CM | POA: Diagnosis not present

## 2022-08-04 DIAGNOSIS — E1140 Type 2 diabetes mellitus with diabetic neuropathy, unspecified: Secondary | ICD-10-CM | POA: Diagnosis not present

## 2022-08-04 DIAGNOSIS — G8929 Other chronic pain: Secondary | ICD-10-CM | POA: Insufficient documentation

## 2022-08-04 DIAGNOSIS — F319 Bipolar disorder, unspecified: Secondary | ICD-10-CM | POA: Diagnosis not present

## 2022-08-04 DIAGNOSIS — Z7901 Long term (current) use of anticoagulants: Secondary | ICD-10-CM | POA: Insufficient documentation

## 2022-08-04 DIAGNOSIS — K047 Periapical abscess without sinus: Secondary | ICD-10-CM | POA: Insufficient documentation

## 2022-08-04 DIAGNOSIS — Z716 Tobacco abuse counseling: Secondary | ICD-10-CM | POA: Insufficient documentation

## 2022-08-04 DIAGNOSIS — Z794 Long term (current) use of insulin: Secondary | ICD-10-CM | POA: Diagnosis not present

## 2022-08-04 LAB — POCT GLYCOSYLATED HEMOGLOBIN (HGB A1C): HbA1c, POC (controlled diabetic range): 9.4 % — AB (ref 0.0–7.0)

## 2022-08-04 LAB — GLUCOSE, POCT (MANUAL RESULT ENTRY): POC Glucose: 201 mg/dl — AB (ref 70–99)

## 2022-08-04 MED ORDER — AMOXICILLIN 500 MG PO CAPS
500.0000 mg | ORAL_CAPSULE | Freq: Three times a day (TID) | ORAL | 0 refills | Status: AC
  Filled 2022-08-04: qty 30, 10d supply, fill #0

## 2022-08-04 NOTE — Progress Notes (Signed)
Subjective:  Patient ID: Bradley [name], male    DOB: Sep 29, 1981  Age: 41 y.o. MRN: [id number]  CC: Diabetes   HPI Bradley [name] is a 41 y.o. year old male with a history of morbid obesity, type 2 diabetes mellitus (A1c 9.4 managed by endocrine, non compliance,diabetic neuropathy (unable to tolerate gabapentin, Lyrica), Bipolar disorder (he goes to [employer]) , previous DVT in 2017, PE (diagnosed in 10/2018 - on chronic anticoagulation with Coumadin).   Interval History:  Last endocrine visit was in 04/23/2022 and he endorses adherence with his diabetes regimen.  He has also been to see the nutritionist with a diabetic clinic and he has had a recent eye exam. Next appointment with Endo will be next month. Denies visual concerns today but does have diabetic neuropathy and has been unable to tolerate gabapentin and Lyrica in the past.  He is currently on Cymbalta and also uses tramadol for pain.  Last dose of tramadol was this morning. PDMP reviewed and he is in compliance.  Last urine drug test was in keeping with prescribed medication.  He thinks he has a tooth abscess and does not have the finances to see a dentist.  He has pain in his teeth in his gum in the incisor region.  He remains on chronic anticoagulation with Coumadin which is being managed by the clinical pharmacist with last INR therapeutic at 2.2. He does have telehealth visits with his psychiatrist for management of his bipolar disorder. Past Medical History:  Diagnosis Date   ADHD    ADHD (attention deficit hyperactivity disorder)    Bipolar 1 disorder (HCC)    Bipolar disorder ([place])    Diabetes mellitus without complication ([place])    Hyperlipidemia    Morbid obesity (HCC)    Obesity    Panic attack    Varicose veins of both lower extremities with pain     Past Surgical History:  Procedure Laterality Date   surgery on meatus as a child      Family History  Problem Relation Age of Onset    Rheum arthritis Mother    Diabetes Mother    Heart failure Mother    Stroke Mother    Glaucoma Maternal Grandmother    Lung cancer Paternal Grandmother        smoker    Social History   Socioeconomic History   Marital status: Single    Spouse name: Not on file   Number of children: Not on file   Years of education: Not on file   Highest education level: Not on file  Occupational History   Not on file  Tobacco Use   Smoking status: Every Day    Packs/day: 1.00    Types: Cigarettes   Smokeless tobacco: Never  Vaping Use   Vaping Use: Never used  Substance and Sexual Activity   Alcohol use: Yes    Comment: Rarely   Drug use: No   Sexual activity: Yes  Other Topics Concern   Not on file  Social History Narrative   ** Merged History Encounter **       Social Determinants of Health   Financial Resource Strain: Not on file  Food Insecurity: Not on file  Transportation Needs: Not on file  Physical Activity: Not on file  Stress: Not on file  Social Connections: Not on file    Allergies  Allergen Reactions   Actos [Pioglitazone] Other (See Comments)    Stomach cramps   Gabapentin  Other (See Comments)    Crying spells   Lyrica [Pregabalin] Other (See Comments)    Makes the patient somnolent    Outpatient Medications Prior to Visit  Medication Sig Dispense Refill   ARIPiprazole (ABILIFY) 5 MG tablet Take 1 tablet (5 mg total) by mouth at bedtime. 30 tablet 2   atorvastatin (LIPITOR) 20 MG tablet Take 1 tablet (20 mg total) by mouth daily. 90 tablet 1   Blood Glucose Calibration (ACCU-CHEK ACTIVE GLUCOSE CONT VI) by In Vitro route.     busPIRone (BUSPAR) 7.5 MG tablet Take 1 tablet(s) by mouth twice a day for severe anxiety/panic attacks (Patient taking differently: 7.5 mg. Takes as needed) 60 tablet 2   doxepin (SINEQUAN) 50 MG capsule Take 1 capsule (50 mg total) by mouth as needed for sleep 30 capsule 2   doxepin (SINEQUAN) 50 MG capsule Take 1 capsule(s) by  mouth at bedtime as needed for sleep 30 capsule 2   DULoxetine (CYMBALTA) 60 MG capsule Take 1 capsule(s) by mouth at bedtime for anxiety/depression 30 capsule 2   empagliflozin (JARDIANCE) 25 MG TABS tablet Take 1 tablet (25 mg total) by mouth daily before breakfast. 90 tablet 3   glucose blood (ACCU-CHEK GUIDE) test strip 1 each by Other route 3 (three) times daily. Use as instructed 300 each 3   insulin degludec (TRESIBA FLEXTOUCH) 100 UNIT/ML FlexTouch Pen Inject 60 Units into the skin daily. 60 mL 3   Insulin Pen Needle (B-D UF III MINI PEN NEEDLES) 31G X 5 MM MISC Use as directed 3 times daily. 300 each 3   Menthol 7.5 % PTCH Apply 1 patch topically as needed (pain).     mupirocin ointment (BACTROBAN) 2 % Apply to right great toe once daily. 30 g 1   nicotine (NICODERM CQ) 14 mg/24hr patch Place 1 patch (14 mg total) onto the skin daily. Then decrease to 7 mg daily 28 patch 2   OneTouch Delica Lancets 33G MISC 1 Device by Does not apply route 3 (three) times daily. 300 each 3   sildenafil (VIAGRA) 50 MG tablet Take 1 tablet (50 mg total) by mouth daily as needed for erectile dysfunction. At least 24 hours between doses 10 tablet 1   traMADol (ULTRAM) 50 MG tablet Take 1 tablet (50 mg total) by mouth every 12 (twelve) hours as needed. 60 tablet 2   warfarin (COUMADIN) 2.5 MG tablet TAKE 1 TABLET BY MOUTH DAILY AS DIRECTED BY THE COUMADIN CLINIC. 90 tablet 1   albuterol (VENTOLIN HFA) 108 (90 Base) MCG/ACT inhaler INHALE 1-2 PUFFS INTO THE LUNGS EVERY 6 (SIX) HOURS AS NEEDED FOR WHEEZING OR SHORTNESS OF BREATH. 18 g 2   No facility-administered medications prior to visit.     ROS Review of Systems  Constitutional:  Negative for activity change and appetite change.  HENT:  Positive for dental problem. Negative for sinus pressure and sore throat.   Respiratory:  Negative for chest tightness, shortness of breath and wheezing.   Cardiovascular:  Negative for chest pain and palpitations.   Gastrointestinal:  Negative for abdominal distention, abdominal pain and constipation.  Genitourinary: Negative.   Musculoskeletal: Negative.   Psychiatric/Behavioral:  Negative for behavioral problems and dysphoric mood.     Objective:  BP 120/79   Pulse (!) 110   Temp 98.3 F (36.8 C) (Oral)   Ht 6' (1.829 m)   Wt 257 lb 3.2 oz (116.7 kg)   SpO2 99%   BMI 34.88 kg/m  08/04/2022    2:05 PM 07/21/2022    3:03 PM 04/25/2022   10:45 PM  BP/Weight  Systolic BP 702    Diastolic BP 79    Wt. (Lbs) 257.2 261 263  BMI 34.88 kg/m2 35.4 kg/m2 35.67 kg/m2      Physical Exam Constitutional:      Appearance: He is well-developed.  Cardiovascular:     Rate and Rhythm: Tachycardia present.     Heart sounds: Normal heart sounds. No murmur heard. Pulmonary:     Effort: Pulmonary effort is normal.     Breath sounds: Normal breath sounds. No wheezing or rales.  Chest:     Chest wall: No tenderness.  Abdominal:     General: Bowel sounds are normal. There is no distension.     Palpations: Abdomen is soft. There is no mass.     Tenderness: There is no abdominal tenderness.  Musculoskeletal:        General: Normal range of motion.     Right lower leg: No edema.     Left lower leg: No edema.  Neurological:     Mental Status: He is alert and oriented to person, place, and time.  Psychiatric:        Mood and Affect: Mood normal.        Latest Ref Rng & Units 03/19/2022    2:21 PM 09/17/2021    2:40 PM 03/19/2021   12:28 PM  CMP  Glucose 70 - 99 mg/dL 241  385  481   BUN 6 - 20 mg/dL 14  14  11    Creatinine 0.61 - 1.24 mg/dL 0.91  0.84  0.99   Sodium 135 - 145 mmol/L 137  136  133   Potassium 3.5 - 5.1 mmol/L 4.6  4.3  4.4   Chloride 98 - 111 mmol/L 101  99  97   CO2 22 - 32 mmol/L 25  29  27    Calcium 8.9 - 10.3 mg/dL 8.9  9.3  9.0   Total Protein 6.5 - 8.1 g/dL 7.7  7.0  7.3   Total Bilirubin 0.3 - 1.2 mg/dL 1.2  1.1  0.6   Alkaline Phos 38 - 126 U/L 70  78  83    AST 15 - 41 U/L 20  16  20    ALT 0 - 44 U/L 29  22  33     Lipid Panel     Component Value Date/Time   CHOL 114 04/22/2022 1449   TRIG 140 04/22/2022 1449   HDL 41 04/22/2022 1449   CHOLHDL 3.9 12/06/2020 1456   CHOLHDL 4.0 11/12/2016 1613   VLDL 28 11/12/2016 1613   LDLCALC 49 04/22/2022 1449    CBC    Component Value Date/Time   WBC 10.1 03/19/2022 1421   WBC 9.5 04/19/2020 2357   RBC 6.84 (H) 03/19/2022 1421   HGB 19.5 (H) 03/19/2022 1421   HGB 18.9 (H) 12/06/2020 1456   HCT 56.6 (H) 03/19/2022 1421   HCT 57.0 (H) 12/06/2020 1456   PLT 114 (L) 03/19/2022 1421   PLT 120 (L) 12/06/2020 1456   MCV 82.7 03/19/2022 1421   MCV 84 12/06/2020 1456   MCH 28.5 03/19/2022 1421   MCHC 34.5 03/19/2022 1421   RDW 13.7 03/19/2022 1421   RDW 13.1 12/06/2020 1456   LYMPHSABS 2.0 03/19/2022 1421   LYMPHSABS 2.0 12/06/2020 1456   MONOABS 0.6 03/19/2022 1421   EOSABS 0.2 03/19/2022 1421   EOSABS 0.1 12/06/2020  1456   BASOSABS 0.1 03/19/2022 1421   BASOSABS 0.1 12/06/2020 1456    Lab Results  Component Value Date   HGBA1C 9.4 (A) 08/04/2022    Lab Results  Component Value Date   INR 2.2 07/31/2022   INR 2.2 06/30/2022   INR 2.2 06/30/2022    Assessment & Plan:  1. Type 2 diabetes mellitus with hyperglycemia, with long-term current use of insulin (HCC) Uncontrolled with A1c 9.4; goal is <7.0 Medication management is currently done by his Endocrinologist Counseled on Diabetic diet, my plate method, [id number] minutes of moderate intensity exercise/week Blood sugar logs with fasting goals of 80-120 mg/dl, random of less than 180 and in the event of sugars less than 60 mg/dl or greater than 400 mg/dl encouraged to notify the clinic. Advised on the need for annual eye exams, annual foot exams, Pneumonia vaccine.  - POCT glucose (manual entry) - POCT glycosylated hemoglobin (Hb A1C)  2. Chronic deep vein thrombosis (DVT) of proximal vein of left lower extremity (HCC) On chronic  anticoagulation with Coumadin Last INR was therapeutic at 2.2  3. Cigarette nicotine dependence without complication Smoking cessation support: smoking cessation hotline: 1-800-QUIT-NOW.  Smoking cessation classes are available through [facility]. Call [phone] or visit our website at [email or web address]  Spent 3 minutes counseling on dangers of tobacco use and benefits of quitting, offered pharmacological intervention to aid quitting and patient is not ready to quit.   4. Other chronic pain Secondary to Diabetic neuropathy Unable to tolerate Gabapentin and Lyrica Continue Cymbalta PDMP reviewed and he is in compliance Continue Tramadol  5. Bipolar 1 disorder, mixed, moderate (HCC) Stable Management as per Psych  6. Dental abscess He needs to see a dentist and has been advised his condition has a high probability of progressing given underlying Complains of-morbidities - amoxicillin (AMOXIL) 500 MG capsule; Take 1 capsule (500 mg total) by mouth 3 (three) times daily for 10 days.  Dispense: 30 capsule; Refill: 0   Meds ordered this encounter  Medications   amoxicillin (AMOXIL) 500 MG capsule    Sig: Take 1 capsule (500 mg total) by mouth 3 (three) times daily for 10 days.    Dispense:  30 capsule    Refill:  0    Follow-up: Return in about 6 months (around 02/02/2023) for Chronic medical conditions.       Charlott Rakes, MD, FAAFP. [facility] [place], [place]   08/04/2022, 4:01 PM

## 2022-08-04 NOTE — Patient Instructions (Signed)
Dental Abscess  A dental abscess is an infection around a tooth that may involve pain, swelling, and a collection of pus, as well as other symptoms. Treatment is important to help with symptoms and to prevent the infection from spreading. The general types of dental abscesses are: Pulpal abscess. This abscess may form from the inner part of the tooth (pulp). Periodontal abscess. This abscess may form from the gum. What are the causes? This condition is caused by a bacterial infection in or around the tooth. It may result from: Severe tooth decay (cavities). Trauma to the tooth, such as a broken or chipped tooth. What increases the risk? This condition is more likely to develop in males. It is also more likely to develop in people who: Have cavities. Have severe gum disease. Eat sugary snacks between meals. Use tobacco products. Have diabetes. Have a weakened disease-fighting system (immune system). Do not brush and care for their teeth regularly. What are the signs or symptoms? Mild symptoms of this condition include: Tenderness. Bad breath. Fever. A bitter taste in the mouth. Pain in and around the infected tooth. Moderate symptoms of this condition include: Swollen neck glands. Chills. Pus drainage. Swelling and redness around the infected tooth, in the mouth, or in the face. Severe pain in and around the infected tooth. Severe symptoms of this condition include: Difficulty swallowing. Difficulty opening the mouth. Nausea. Vomiting. How is this diagnosed? This condition is diagnosed based on: Your symptoms and your medical and dental history. An examination of the infected tooth. During the exam, your dental care provider may tap on the infected tooth. You may also need to have X-rays taken of the affected area. How is this treated? This condition is treated by getting rid of the infection. This may be done with: Antibiotic medicines. These may be used in certain  situations. Antibacterial mouth rinse. Incision and drainage. This procedure is done by making an incision in the abscess to drain out the pus. Removing pus is the first priority in treating an abscess. A root canal. This may be performed to save the tooth. Your dental care provider accesses the visible part of your tooth (crown) with a drill and removes any infected pulp. Then the space is filled and sealed off. Tooth extraction. The tooth is pulled out if it cannot be saved by other treatment. You may also receive treatment for pain, such as: Acetaminophen or NSAIDs. Gels that contain a numbing medicine. An injection to block the pain near your nerve. Follow these instructions at home: Medicines Take over-the-counter and prescription medicines only as told by your dental care provider. If you were prescribed an antibiotic, take it as told by your dental care provider. Do not stop taking the antibiotic even if you start to feel better. If you were prescribed a gel that contains a numbing medicine, use it exactly as told in the directions. Do not use these gels for children who are younger than 2 years of age. Use an antibacterial mouth rinse as told by your dental care provider. General instructions  Gargle with a mixture of salt and water 3-4 times a day or as needed. To make salt water, completely dissolve -1 tsp (3-6 g) of salt in 1 cup (237 mL) of warm water. Eat a soft diet while your abscess is healing. Drink enough fluid to keep your urine pale yellow. Do not apply heat to the outside of your mouth. Do not use any products that contain nicotine or tobacco. These   products include cigarettes, chewing tobacco, and vaping devices, such as e-cigarettes. If you need help quitting, ask your dental care provider. Keep all follow-up visits. This is important. How is this prevented?  Excellent dental home care, which includes brushing your teeth every morning and night with fluoride  toothpaste. Floss one time each day. Get regularly scheduled dental cleanings. Consider having a dental sealant applied on teeth that have deep grooves to prevent cavities. Drink fluoridated water regularly. This includes most tap water. Check the label on bottled water to see if it contains fluoride. Reduce or eliminate sugary drinks. Eat healthy meals and snacks. Wear a mouth guard or face shield to protect your teeth while playing sports. Contact a health care provider if: Your pain is worse and is not helped by medicine. You have swelling. You see pus around the tooth. You have a fever or chills. Get help right away if: Your symptoms suddenly get worse. You have a very bad headache. You have problems breathing or swallowing. You have trouble opening your mouth. You have swelling in your neck or around your eye. These symptoms may represent a serious problem that is an emergency. Do not wait to see if the symptoms will go away. Get medical help right away. Call your local emergency services (911 in the [place] Do not drive yourself to the hospital. Summary A dental abscess is a collection of pus in or around a tooth that results from an infection. A dental abscess may result from severe tooth decay, trauma to the tooth, or severe gum disease around a tooth. Symptoms include severe pain, swelling, redness, and drainage of pus in and around the infected tooth. The first priority in treating a dental abscess is to drain out the pus. Treatment may also involve removing damage inside the tooth (root canal) or extracting the tooth. This information is not intended to replace advice given to you by your health care provider. Make sure you discuss any questions you have with your health care provider. Document Revised: 11/22/2020 Document Reviewed: 11/22/2020 Elsevier Patient Education  2023 Elsevier Inc.  

## 2022-08-11 ENCOUNTER — Other Ambulatory Visit: Payer: Self-pay

## 2022-08-15 ENCOUNTER — Other Ambulatory Visit: Payer: Self-pay

## 2022-08-26 ENCOUNTER — Telehealth: Payer: Self-pay | Admitting: Physician Assistant

## 2022-08-26 NOTE — Telephone Encounter (Signed)
Per provider pal called pt and left message about appointment change

## 2022-09-01 ENCOUNTER — Ambulatory Visit
Admission: EM | Admit: 2022-09-01 | Discharge: 2022-09-01 | Disposition: A | Discharge status: Home / Self Care | Payer: Medicare Other | Attending: Physician Assistant | Admitting: Physician Assistant

## 2022-09-01 DIAGNOSIS — H6691 Otitis media, unspecified, right ear: Secondary | ICD-10-CM | POA: Diagnosis not present

## 2022-09-01 MED ORDER — CEFDINIR 300 MG PO CAPS: 300.0000 mg | ORAL_CAPSULE | Freq: Two times a day (BID) | ORAL | 0 refills | Status: DC

## 2022-09-01 NOTE — ED Triage Notes (Signed)
Pt presents with right ear fullness X 2 days. 

## 2022-09-01 NOTE — ED Provider Notes (Addendum)
[facility]    CSN: [id number] Arrival date & time: 09/01/22  1429      History   Chief Complaint Chief Complaint  Patient presents with   Ear Fullness    HPI Bradley [name] is a 41 y.o. male.   Patient complains of pain in his right ear.  Patient reports he tried a earwax removal kit but he could not tolerate the pain.  Family member with patient states he has been sticking a key in his ear.  Denies any fever or chills he has not had any cough or congestion he is a diabetic is not allergic to any medications  The history is provided by the patient. No language interpreter was used.  Ear Fullness This is a new problem. The current episode started 2 days ago. The problem occurs constantly. The problem has been gradually worsening. Nothing aggravates the symptoms. Nothing relieves the symptoms.    Past Medical History:  Diagnosis Date   ADHD    ADHD (attention deficit hyperactivity disorder)    Bipolar 1 disorder ([place])    Bipolar disorder ([place])    Diabetes mellitus without complication ([place])    Hyperlipidemia    Morbid obesity ([place])    Obesity    Panic attack    Varicose veins of both lower extremities with pain     Patient Active Problem List   Diagnosis Date Noted   Hyperglycemia 03/19/2021   Polycythemia 12/17/2020   Hypertriglyceridemia 10/18/2020   Pain due to onychomycosis of toenails of both feet 09/28/2019   Diabetes mellitus without complication ([place]) 26/94/8546   Venous stasis dermatitis of left lower extremity 09/28/2019   Pulmonary emboli ([place]) 11/27/2018   Splenomegaly 11/27/2018   Focal nodular hyperplasia of liver 11/27/2018   Diabetic neuropathy ([place]) 05/12/2018   Chronic pain syndrome 05/20/2017   Bipolar disorder, current episode mixed, moderate ([place]) 03/04/2017   Varicose veins of both lower extremities with pain 02/18/2017   Chronic deep vein thrombosis (DVT) of proximal vein of left lower extremity ([place]) 11/12/2016   Smoking  addiction 06/19/2016   HTN (hypertension) 07/27/2013   Morbid obesity ([place]) 07/27/2013   Bipolar 1 disorder, mixed, moderate ([place]) 07/27/2013   DM2 (diabetes mellitus, type 2) ([place]) 04/26/2013    Past Surgical History:  Procedure Laterality Date   surgery on meatus as a child         Home Medications    Prior to Admission medications   Medication Sig Start Date End Date Taking? Authorizing Provider  cefdinir (OMNICEF) 300 MG capsule Take 1 capsule (300 mg total) by mouth 2 (two) times daily. 09/01/22  Yes Caryl Ada K, PA-C  albuterol (VENTOLIN HFA) 108 (90 Base) MCG/ACT inhaler INHALE 1-2 PUFFS INTO THE LUNGS EVERY 6 (SIX) HOURS AS NEEDED FOR WHEEZING OR SHORTNESS OF BREATH. 08/09/20 08/09/21  Charlott Rakes, MD  ARIPiprazole (ABILIFY) 5 MG tablet Take 1 tablet (5 mg total) by mouth at bedtime. 05/31/21     atorvastatin (LIPITOR) 20 MG tablet Take 1 tablet (20 mg total) by mouth daily. 06/30/22   Charlott Rakes, MD  Blood Glucose Calibration (ACCU-CHEK ACTIVE GLUCOSE CONT VI) by In Vitro route.    [provider]  busPIRone (BUSPAR) 7.5 MG tablet Take 1 tablet(s) by mouth twice a day for severe anxiety/panic attacks Patient taking differently: 7.5 mg. Takes as needed 07/31/21     doxepin (SINEQUAN) 50 MG capsule Take 1 capsule (50 mg total) by mouth as needed for sleep 05/31/21  doxepin (SINEQUAN) 50 MG capsule Take 1 capsule(s) by mouth at bedtime as needed for sleep 07/31/21     DULoxetine (CYMBALTA) 60 MG capsule Take 1 capsule(s) by mouth at bedtime for anxiety/depression 07/31/21     empagliflozin (JARDIANCE) 25 MG TABS tablet Take 1 tablet (25 mg total) by mouth daily before breakfast. 04/23/22   Shamleffer, Melanie Crazier, MD  glucose blood (ACCU-CHEK GUIDE) test strip 1 each by Other route 3 (three) times daily. Use as instructed 12/20/21   Shamleffer, Melanie Crazier, MD  insulin degludec (TRESIBA FLEXTOUCH) 100 UNIT/ML FlexTouch Pen Inject 60 Units into the skin  daily. 04/23/22   Shamleffer, Melanie Crazier, MD  Insulin Pen Needle (B-D UF III MINI PEN NEEDLES) 31G X 5 MM MISC Use as directed 3 times daily. 07/21/22   Shamleffer, Melanie Crazier, MD  Menthol 7.5 % PTCH Apply 1 patch topically as needed (pain).    [provider]  mupirocin ointment (BACTROBAN) 2 % Apply to right great toe once daily. 11/19/21   Marzetta Board, DPM  nicotine (NICODERM CQ) 14 mg/24hr patch Place 1 patch (14 mg total) onto the skin daily. Then decrease to 7 mg daily 04/21/22   Charlott Rakes, MD  OneTouch Delica Lancets 32D MISC 1 Device by Does not apply route 3 (three) times daily. 08/16/21   Shamleffer, Melanie Crazier, MD  sildenafil (VIAGRA) 50 MG tablet Take 1 tablet (50 mg total) by mouth daily as needed for erectile dysfunction. At least 24 hours between doses 05/03/20   Charlott Rakes, MD  traMADol (ULTRAM) 50 MG tablet Take 1 tablet (50 mg total) by mouth every 12 (twelve) hours as needed. 07/10/22 01/08/23  Charlott Rakes, MD  warfarin (COUMADIN) 2.5 MG tablet TAKE 1 TABLET BY MOUTH DAILY AS DIRECTED BY THE COUMADIN CLINIC. 06/20/22   Charlott Rakes, MD    Family History Family History  Problem Relation Age of Onset   Rheum arthritis Mother    Diabetes Mother    Heart failure Mother    Stroke Mother    Glaucoma Maternal Grandmother    Lung cancer Paternal Grandmother        smoker    Social History Social History   Tobacco Use   Smoking status: Every Day    Packs/day: 1.00    Types: Cigarettes   Smokeless tobacco: Never  Vaping Use   Vaping Use: Never used  Substance Use Topics   Alcohol use: Yes    Comment: Rarely   Drug use: No     Allergies   Actos [pioglitazone], Gabapentin, and Lyrica [pregabalin]   Review of Systems Review of Systems  HENT:  Positive for ear pain.   All other systems reviewed and are negative.    Physical Exam Triage Vital Signs ED Triage Vitals  Enc Vitals Group     BP 09/01/22 1551 109/76      Pulse Rate 09/01/22 1551 (!) 106     Resp 09/01/22 1551 18     Temp 09/01/22 1551 97.9 F (36.6 C)     Temp Source 09/01/22 1551 Oral     SpO2 09/01/22 1551 97 %     Weight --      Height --      Head Circumference --      Peak Flow --      Pain Score 09/01/22 1550 0     Pain Loc --      Pain Edu? --      Excl. in [place]? --  No data found.  Updated Vital Signs BP 109/76 (BP Location: Left Arm)   Pulse (!) 106   Temp 97.9 F (36.6 C) (Oral)   Resp 18   SpO2 97%   Visual Acuity Right Eye Distance:   Left Eye Distance:   Bilateral Distance:    Right Eye Near:   Left Eye Near:    Bilateral Near:     Physical Exam Vitals reviewed.  Constitutional:      Appearance: Normal appearance.  HENT:     Head: Normocephalic and atraumatic.     Left Ear: Tympanic membrane normal.     Ears:     Comments: Red ring around right TM, landmarks poorly visualized.    Nose: Nose normal.     Mouth/Throat:     Mouth: Mucous membranes are moist.  Cardiovascular:     Rate and Rhythm: Normal rate.  Pulmonary:     Effort: Pulmonary effort is normal.  Abdominal:     General: Abdomen is flat.  Musculoskeletal:        General: Normal range of motion.  Skin:    General: Skin is warm.  Neurological:     General: No focal deficit present.     Mental Status: He is alert.  Psychiatric:        Mood and Affect: Mood normal.      UC Treatments / Results  Labs (all labs ordered are listed, but only abnormal results are displayed) Labs Reviewed - No data to display  EKG   Radiology No results found.  Procedures Procedures (including critical care time)  Medications Ordered in UC Medications - No data to display  Initial Impression / Assessment and Plan / UC Course  I have reviewed the triage vital signs and the nursing notes.  Pertinent labs & imaging results that were available during my care of the patient were reviewed by me and considered in my medical decision making (see  chart for details).     GHW:EXHBZJI counseled on otitis media he is advised to stop putting things in his ears he can discontinue the earwax removal.  Patient is advised to see his primary care physician for recheck Final Clinical Impressions(s) / UC Diagnoses   Final diagnoses:  Right otitis media, unspecified otitis media type     Discharge Instructions      Return if any problems.     ED Prescriptions     Medication Sig Dispense Auth. Provider   cefdinir (OMNICEF) 300 MG capsule Take 1 capsule (300 mg total) by mouth 2 (two) times daily. 20 capsule Fransico Meadow, [place]      PDMP not reviewed this encounter. An After Visit Summary was printed and given to the patient.       Fransico Meadow, PA-C 09/01/22 1636    Fransico Meadow, PA-C 09/01/22 1636

## 2022-09-01 NOTE — Discharge Instructions (Signed)
Return if any problems.

## 2022-09-08 ENCOUNTER — Ambulatory Visit: Payer: Medicare Other | Attending: Family Medicine | Admitting: Pharmacist

## 2022-09-08 ENCOUNTER — Encounter: Payer: Medicare Other | Attending: Internal Medicine | Admitting: Dietician

## 2022-09-08 ENCOUNTER — Other Ambulatory Visit: Payer: Self-pay

## 2022-09-08 VITALS — Wt 260.0 lb

## 2022-09-08 DIAGNOSIS — E118 Type 2 diabetes mellitus with unspecified complications: Secondary | ICD-10-CM | POA: Insufficient documentation

## 2022-09-08 DIAGNOSIS — I825Y2 Chronic embolism and thrombosis of unspecified deep veins of left proximal lower extremity: Secondary | ICD-10-CM

## 2022-09-08 LAB — POCT INR: INR: 2.4 (ref 2–3)

## 2022-09-08 NOTE — Patient Instructions (Addendum)
Great job on taking your medication more consistently!  Keep this up. Continue to choose beverages without sugar Check your blood glucose consistently.  (Take your meter with you to see Dr. Lonzo Cloud.)  Quit smoking classes:  Call [phone] or visit our website at [email or web address]. Or 1800-QUIT NOW.

## 2022-09-08 NOTE — Progress Notes (Signed)
Diabetes Self-Management Education  Visit Type: Follow-up  Appt. Start Time: 1200 Appt. End Time: 1230  09/08/2022  [name], identified by name and date of birth, is a 41 y.o. male with a diagnosis of Diabetes:  .   ASSESSMENT Patient is here today with his girlfriend.  He was last seen by myself on 07/21/2022. Fasting blood glucose 240-250 at home. Glucose 308 in the office today.  (Drank a canned coffee with sugar) Many barriers to change but improved A1C and stable weight. Generally only eats 1 meal per day and doesn't wish to change this at this time.  Food security issues at times. Continues to smoke but did put the [facility] quit number as well as [facility] smoking quit class in his phone.  History includes Type 2 Diabetes (2019), DVT, Bipolar, ADHD, neuropathy, smoking, memory issues, polycythemia, thrombocytopenia, history of DVT/PE Labs noted to include:  A1C 9.8% 04/23/2022 decreased from 12.7% 08/16/2021 and 12/19/2021, 13.6% 08/09/2020 increased from 13.2% 05/03/2020, cholesterol 193, Triglycerides 365, HDL 40, LDL 92 08/09/2020 Medications include:  Trulicity (stopped), glipizide (discontinued), actos (discontinued), Jardiance, Tresiba 60 units q HS, Novolog before meals (not taking), coumadin, Abilify and other medications that contribute to weight gain. He stopped pioglitizone after 3 days due to stomach cramps. He could not tolerated Metformin due to bowel incontinence.    Sleep:  Eats late (10pm - midnight).  Goes to sleep late.  Wakes about 10:30 am (although later now) Support:  [facility] [employer] [facility], patient of Dr. Lonzo Cloud at [facility], PCP Hoy Register, MD   Weight: 260 lbs 09/08/2022 261 lbs 07/21/2022 263 lbs 04/21/2022 278 lbs 12/19/2021 279 lbs 10/24/2021 282 lbs 08/16/2021 loss likely due to uncontrolled blood glucose 295 lbs 02/14/21 306 lbs 47/2022 306 lbs 11/23/2020 425 lbs highest adult weight 2 years  ago (loss presumably due to uncontrolled blood sugar) Lowest adult weight 235 lbs about 8 years ago and he does not recall circumstances of increased weight gain.   Patient lives with his girlfriend.  She has type 2 diabetes.  He does the shopping and cooking.  Dislikes most vegetables. He is on disability.  Finances are a concern. His mother passed away 2020-11-27 from complications of diabetes. Girlfriend  goes to the gym 2-3 times per week [facility]).  Patient is not motivated to go. Complains of neuropathy pain which worsens with exercise.  Weight 260 lb (117.9 kg). Body mass index is 35.26 kg/m.   Diabetes Self-Management Education - 09/08/22 1300       Visit Information   Visit Type Follow-up      Psychosocial Assessment   What is the hardest part about your diabetes right now, causing you the most concern, or is the most worrisome to you about your diabetes?   Making healty food and beverage choices    Self-care barriers Lack of material resources    Self-management support Doctor's office;CDE visits;Family    Other persons present Patient    Patient Concerns Nutrition/Meal planning      Pre-Education Assessment   Patient understands the diabetes disease and treatment process. Needs Review    Patient understands incorporating nutritional management into lifestyle. Needs Review    Patient undertands incorporating physical activity into lifestyle. Needs Review    Patient understands using medications safely. Needs Review    Patient understands monitoring blood glucose, interpreting and using results Needs Review    Patient understands prevention, detection, and treatment of acute complications. Needs Review  Patient understands prevention, detection, and treatment of chronic complications. Needs Review    Patient understands how to develop strategies to address psychosocial issues. Needs Review    Patient understands how to develop strategies to promote health/change behavior. Needs  Review      Complications   Last HgB A1C per patient/outside source 9.8 %   04/23/22   Fasting Blood glucose range (mg/dL) >865    Postprandial Blood glucose range (mg/dL) >784      Dietary Intake   Snack (afternoon) pretzels    Dinner 7 slices of pizza    Beverage(s) diet Mt. Dew, water (rare), coffee with sugar rare      Activity / Exercise   Activity / Exercise Type ADL's      Patient Education   Previous Diabetes Education Yes (please comment)   06/2022   Healthy Eating Meal timing in regards to the patients' current diabetes medication.    Being Active Helped patient identify appropriate exercises in relation to his/her diabetes, diabetes complications and other health issue.    Medications Reviewed patients medication for diabetes, action, purpose, timing of dose and side effects.    Monitoring Taught/evaluated CGM (comment)    Lifestyle and Health Coping Lifestyle issues that need to be addressed for better diabetes care;Review risk of smoking and offered smoking cessation      Individualized Goals (developed by patient)   Nutrition General guidelines for healthy choices and portions discussed    Medications take my medication as prescribed    Monitoring  Test my blood glucose as discussed    Problem Solving Eating Pattern      Patient Self-Evaluation of Goals - Patient rates self as meeting previously set goals (% of time)   Nutrition 25 - 50% (sometimes)    Physical Activity < 25% (hardly ever/never)    Medications >75% (most of the time)    Monitoring 50 - 75 % (half of the time)    Problem Solving and behavior change strategies  25 - 50% (sometimes)    Reducing Risk (treating acute and chronic complications) 25 - 50% (sometimes)    Health Coping 50 - 75 % (half of the time)      Post-Education Assessment   Patient understands the diabetes disease and treatment process. Needs Review    Patient understands incorporating nutritional management into lifestyle. Needs  Review    Patient undertands incorporating physical activity into lifestyle. Comprehends key points    Patient understands using medications safely. Comphrehends key points    Patient understands monitoring blood glucose, interpreting and using results Comprehends key points    Patient understands prevention, detection, and treatment of acute complications. Comprehends key points    Patient understands prevention, detection, and treatment of chronic complications. Comprehends key points    Patient understands how to develop strategies to address psychosocial issues. Comprehends key points    Patient understands how to develop strategies to promote health/change behavior. Comprehends key points      Outcomes   Expected Outcomes Demonstrated interest in learning but significant barriers to change    Future DMSE 3-4 months    Program Status Not Completed      Subsequent Visit   Since your last visit have you experienced any weight changes? Loss    Weight Loss (lbs) 1             Individualized Plan for Diabetes Self-Management Training:   Learning Objective:  Patient will have a greater understanding of diabetes self-management.  Patient education plan is to attend individual and/or group sessions per assessed needs and concerns.   Plan:   Patient Instructions  Randie Heinz job on taking your medication more consistently!  Keep this up. Continue to choose beverages without sugar Check your blood glucose consistently.  (Take your meter with you to see Dr. Lonzo Cloud.)  Quit smoking classes:  Call [phone] or visit our website at [email or web address]. Or 1800-QUIT NOW.  Expected Outcomes:  Demonstrated interest in learning but significant barriers to change  Education material provided:   If problems or questions, patient to contact team via:  Phone  Future DSME appointment: 3-4 months

## 2022-09-10 ENCOUNTER — Other Ambulatory Visit: Payer: Self-pay

## 2022-09-10 ENCOUNTER — Encounter: Payer: Self-pay | Admitting: Internal Medicine

## 2022-09-10 ENCOUNTER — Ambulatory Visit (INDEPENDENT_AMBULATORY_CARE_PROVIDER_SITE_OTHER): Payer: Medicare Other | Admitting: Internal Medicine

## 2022-09-10 VITALS — BP 120/76 | HR 106 | Ht 72.0 in | Wt 262.0 lb

## 2022-09-10 DIAGNOSIS — E1142 Type 2 diabetes mellitus with diabetic polyneuropathy: Secondary | ICD-10-CM | POA: Diagnosis not present

## 2022-09-10 DIAGNOSIS — E1165 Type 2 diabetes mellitus with hyperglycemia: Secondary | ICD-10-CM

## 2022-09-10 DIAGNOSIS — Z794 Long term (current) use of insulin: Secondary | ICD-10-CM

## 2022-09-10 DIAGNOSIS — E781 Pure hyperglyceridemia: Secondary | ICD-10-CM

## 2022-09-10 LAB — POCT GLUCOSE (DEVICE FOR HOME USE): POC Glucose: 351 mg/dl — AB (ref 70–99)

## 2022-09-10 MED ORDER — TRESIBA FLEXTOUCH 100 UNIT/ML ~~LOC~~ SOPN
70.0000 [IU] | PEN_INJECTOR | Freq: Every day | SUBCUTANEOUS | 6 refills | Status: DC
  Filled 2022-09-10: qty 60, 85d supply, fill #0

## 2022-09-10 MED ORDER — EMPAGLIFLOZIN 25 MG PO TABS
25.0000 mg | ORAL_TABLET | Freq: Every day | ORAL | 3 refills | Status: DC
  Filled 2022-09-10 – 2022-11-09 (×2): qty 90, 90d supply, fill #0
  Filled 2023-02-02: qty 90, 90d supply, fill #1

## 2022-09-10 MED ORDER — BD PEN NEEDLE MINI U/F 31G X 5 MM MISC
1.0000 | Freq: Three times a day (TID) | 3 refills | Status: DC
  Filled 2022-09-10: qty 100, 33d supply, fill #0
  Filled 2022-09-12: qty 200, 66d supply, fill #0

## 2022-09-10 NOTE — Patient Instructions (Signed)
-   Continue   Jardiance 25 mg, 1 tablet daily  - Increase Tresiba 70 units once daily      HOW TO TREAT LOW BLOOD SUGARS (Blood sugar LESS THAN 70 MG/DL) Please follow the RULE OF 15 for the treatment of hypoglycemia treatment (when your (blood sugars are less than 70 mg/dL)   STEP 1: Take 15 grams of carbohydrates when your blood sugar is low, which includes:  3-4 GLUCOSE TABS  OR 3-4 OZ OF JUICE OR REGULAR SODA OR ONE TUBE OF GLUCOSE GEL    STEP 2: RECHECK blood sugar in 15 MINUTES STEP 3: If your blood sugar is still low at the 15 minute recheck --> then, go back to STEP 1 and treat AGAIN with another 15 grams of carbohydrates.

## 2022-09-10 NOTE — Progress Notes (Signed)
Name: Bradley [name]  Age/ Sex: 41 y.o., male   MRN/ DOB: [id number], 1981/02/14     PCP: Charlott Rakes, MD   Reason for Endocrinology Evaluation: Type 2 Diabetes Mellitus  Initial Endocrine Consultative Visit: 10/18/2020    PATIENT IDENTIFIER: Bradley [name] is a 41 y.o. male with a past medical history of T2Dm, DVT and bipolar disorder. The patient has followed with Endocrinology clinic since 10/18/2020 for consultative assistance with management of his diabetes.  DIABETIC HISTORY:  Bradley [name] was diagnosed with DM yrs ago. Basaglar made him feel sluggish. His hemoglobin A1c has ranged from 7.7% in 2019, peaking at 14.9% in 2021.   On his initial visit to our clinic his A1c was 13.6 %, he DECLINED insulin. He was on Glipizide and Trulicity, we increased Glipizide, Trulicity and added Pioglitazone. By the time he went  To see our RD in 12/2020 he was off pioglitazone  Due to abdominal cramps    Stopped Glipizide and started basal insulin 12/979  We stop Trulicity by March 1914 as he was not taking it and he would forget to take it, we will start him on Jardiance instead as his daily tablet to improve compliance     He is disabled due to mental health      SUBJECTIVE:   During the last visit (04/23/2022): A1c 9.8 %  Today (09/10/2022): Bradley [name] is here for a follow up on diabetes management. .   He has not been checking his glucose at home.    He met with our RD on 04/17/2022, he stated to her that he does not take NovoLog because he refuses to stick himself multiple times a day Saw his PCP 04/17/2022 Continues to follow-up with podiatry with the last visit on 02/18/2022  Denies nausea, vomiting or diarrhea       HOME DIABETES REGIMEN:  Jardiance 25 mg daily Tresiba 60 units daily     Statin: no ACE-I/ARB: no Prior Diabetic Education: yes   METER DOWNLOAD SUMMARY: Did not bring    DIABETIC COMPLICATIONS: Microvascular complications:   neuropathy Denies: CKD retinopathy Last Eye Exam: Completed 05/21/2021  Macrovascular complications:   Denies: CAD, CVA, PVD   HISTORY:  Past Medical History:  Past Medical History:  Diagnosis Date   ADHD    ADHD (attention deficit hyperactivity disorder)    Bipolar 1 disorder ([place])    Bipolar disorder ([place])    Diabetes mellitus without complication ([place])    Hyperlipidemia    Morbid obesity ([place])    Obesity    Panic attack    Varicose veins of both lower extremities with pain    Past Surgical History:  Past Surgical History:  Procedure Laterality Date   surgery on meatus as a child     Social History:  reports that he has been smoking cigarettes. He has been smoking an average of 1 pack per day. He has never used smokeless tobacco. He reports current alcohol use. He reports that he does not use drugs. Family History:  Family History  Problem Relation Age of Onset   Rheum arthritis Mother    Diabetes Mother    Heart failure Mother    Stroke Mother    Glaucoma Maternal Grandmother    Lung cancer Paternal Grandmother        smoker     HOME MEDICATIONS: Allergies as of 09/10/2022       Reactions   Actos [pioglitazone] Other (See Comments)   Stomach cramps  Gabapentin Other (See Comments)   Crying spells   Lyrica [pregabalin] Other (See Comments)   Makes the patient somnolent        Medication List        Accurate as of September 10, 2022  1:54 PM. If you have any questions, ask your nurse or doctor.          STOP taking these medications    nicotine 14 mg/24hr patch Commonly known as: Nicoderm CQ Stopped by: Bradley Sciara, MD       TAKE these medications    ACCU-CHEK ACTIVE GLUCOSE CONT VI by In Vitro route.   Accu-Chek Guide test strip Generic drug: glucose blood 1 each by Other route 3 (three) times daily. Use as instructed   albuterol 108 (90 Base) MCG/ACT inhaler Commonly known as: VENTOLIN HFA INHALE 1-2 PUFFS INTO THE  LUNGS EVERY 6 (SIX) HOURS AS NEEDED FOR WHEEZING OR SHORTNESS OF BREATH.   ARIPiprazole 5 MG tablet Commonly known as: ABILIFY Take 1 tablet (5 mg total) by mouth at bedtime.   atorvastatin 20 MG tablet Commonly known as: LIPITOR Take 1 tablet (20 mg total) by mouth daily.   busPIRone 7.5 MG tablet Commonly known as: BUSPAR Take 1 tablet(s) by mouth twice a day for severe anxiety/panic attacks What changed:  how much to take additional instructions   cefdinir 300 MG capsule Commonly known as: OMNICEF Take 1 capsule (300 mg total) by mouth 2 (two) times daily.   doxepin 50 MG capsule Commonly known as: SINEQUAN Take 1 capsule (50 mg total) by mouth as needed for sleep   doxepin 50 MG capsule Commonly known as: SINEQUAN Take 1 capsule(s) by mouth at bedtime as needed for sleep   DULoxetine 30 MG capsule Commonly known as: CYMBALTA Take 30 mg by mouth daily.   DULoxetine 60 MG capsule Commonly known as: CYMBALTA Take 1 capsule(s) by mouth at bedtime for anxiety/depression   Jardiance 25 MG Tabs tablet Generic drug: empagliflozin Take 1 tablet (25 mg total) by mouth daily before breakfast.   Menthol (Topical Analgesic) 7.5 % Ptch Apply 1 patch topically as needed (pain).   mupirocin ointment 2 % Commonly known as: BACTROBAN Apply to right great toe once daily.   OneTouch Delica Lancets 28U Misc 1 Device by Does not apply route 3 (three) times daily.   sildenafil 50 MG tablet Commonly known as: Viagra Take 1 tablet (50 mg total) by mouth daily as needed for erectile dysfunction. At least 24 hours between doses   TechLite Pen Needles 31G X 5 MM Misc Generic drug: Insulin Pen Needle Use as directed 3 times daily.   traMADol 50 MG tablet Commonly known as: ULTRAM Take 1 tablet (50 mg total) by mouth every 12 (twelve) hours as needed.   Tyler Aas FlexTouch 100 UNIT/ML FlexTouch Pen Generic drug: insulin degludec Inject 60 Units into the skin daily.   warfarin  2.5 MG tablet Commonly known as: COUMADIN Take as directed by the anticoagulation clinic. If you are unsure how to take this medication, talk to your nurse or doctor. Original instructions: TAKE 1 TABLET BY MOUTH DAILY AS DIRECTED BY THE COUMADIN CLINIC.         OBJECTIVE:   Vital Signs: BP 120/76 (BP Location: Left Arm, Patient Position: Sitting, Cuff Size: Small)   Pulse (!) 106   Ht 6' (1.829 m)   Wt 262 lb (118.8 kg)   SpO2 99%   BMI 35.53 kg/m   Wt Readings  from Last 3 Encounters:  09/10/22 262 lb (118.8 kg)  09/08/22 260 lb (117.9 kg)  08/04/22 257 lb 3.2 oz (116.7 kg)     Exam: General: Pt appears well and is in NAD  Lungs: Clear with good BS bilat with no rales, rhonchi, or wheezes  Heart: RRR with normal S1 and S2 and no gallops; no murmurs; no rub  Abdomen: Normoactive bowel sounds, soft, nontender, without masses or organomegaly palpable  Extremities: Trace pretibial edema.  Stasis dermatitis over the shins  Neuro: MS is good with appropriate affect, pt is alert and Ox3     DM foot exam: 12/19/2021   The skin of the feet is without sores or ulcerations, had a right great toe discoloration  The pedal pulses are 2+ on right and 2+ on left. The sensation is intact to a screening 5.07, 10 gram monofilament bilaterally    DATA REVIEWED:  Lab Results  Component Value Date   HGBA1C 9.4 (A) 08/04/2022   HGBA1C 9.8 (A) 04/23/2022   HGBA1C 12.7 (A) 12/19/2021     Latest Reference Range & Units 03/19/22 14:21  Sodium 135 - 145 mmol/L 137  Potassium 3.5 - 5.1 mmol/L 4.6  Chloride 98 - 111 mmol/L 101  CO2 22 - 32 mmol/L 25  Glucose 70 - 99 mg/dL 241 (H)  BUN 6 - 20 mg/dL 14  Creatinine 0.61 - 1.24 mg/dL 0.91  Calcium 8.9 - 10.3 mg/dL 8.9  Anion gap 5 - 15  11  Alkaline Phosphatase 38 - 126 U/L 70  Albumin 3.5 - 5.0 g/dL 4.1  AST 15 - 41 U/L 20  ALT 0 - 44 U/L 29  Total Protein 6.5 - 8.1 g/dL 7.7  Total Bilirubin 0.3 - 1.2 mg/dL 1.2  GFR, Est Non African  American >60 mL/min >60    Latest Reference Range & Units 04/22/22 14:49  Cholesterol, Total 100 - 199 mg/dL 114  HDL Cholesterol >39 mg/dL 41  Total Non-HDL-Chol (LDL+VLDL) 0 - 129 mg/dL 73  Triglycerides 0 - 149 mg/dL 140  VLDL Cholesterol Cal 5 - 40 mg/dL 24  LDL Chol Calc (NIH) 0 - 99 mg/dL 49      ASSESSMENT / PLAN / RECOMMENDATIONS:   1) Type 2 Diabetes Mellitus, Poorly controlled, With Neuropathic  complications - Most recent A1c of 9.4%. Goal A1c < 7.0 %.   - I have praised the pt on continued glycemic control improvement. He understands the A1c is < 7.0%  - In office BG 351 mg/dl after eating 2 pieces of cake  -He was not able to take Trulicity as it was once weekly injection and he would forget to take it he also has chronic GI issues and GLP-1 agonist may not be a good option -He refuses to use NovoLog, he is under the impression that he does not need it.  I initially thought that the barrier is the multiple daily injection, so I offered CeQUR but he declined -I will increase his insulin as below    MEDICATIONS:   Continue  Jardiance 25 mg daily Increase Tresiba 70 units once daily    EDUCATION / INSTRUCTIONS: BG monitoring instructions: Patient is instructed to check his blood sugars 3 times a day, before meals  Call Bradley [name] Endocrinology clinic if: BG persistently < 70  I reviewed the Rule of 15 for the treatment of hypoglycemia in detail with the patient. Literature supplied.    2) Diabetic complications:  Eye: Does not have known diabetic retinopathy.  Neuro/ Feet: Does  have known diabetic peripheral neuropathy  Renal: Patient does not have known baseline CKD. He   is not on an ACEI/ARB at present.   3) Hypertriglyceridemia:  -Historically he has had elevated triglycerides up to 367 mg/DL in 2021, this has trended down to 236 in 2022, repeat triglycerides has normalized in July 2023 at 140 mg/DL -Will continue to monitor   F/U in 6 months  Signed  electronically by: Mack Guise, MD  [facility] Endocrinology  [facility] [address] [place], [place] [address] Phone: [phone] FAX: [phone]   CC: Charlott Rakes, MD [place] [address] Phone: [phone]  Fax: [phone]  Return to Endocrinology clinic as below: Future Appointments  Date Time Provider [place]  09/24/2022 12:45 PM [facility] None  09/24/2022  1:20 PM Lincoln Brigham, PA-C [facility] None  10/06/2022  3:45 PM Marzetta Board, DPM [facility]  10/09/2022  2:30 PM Tresa Endo, RPH-CPP [facility] None  12/08/2022  2:00 PM Clydell Hakim, RD [place] [facility]  02/02/2023  2:10 PM Charlott Rakes, MD [facility] None  05/22/2023  1:00 PM Bernarda Caffey, MD [facility] None

## 2022-09-11 ENCOUNTER — Other Ambulatory Visit: Payer: Self-pay

## 2022-09-12 ENCOUNTER — Other Ambulatory Visit: Payer: Self-pay

## 2022-09-17 ENCOUNTER — Other Ambulatory Visit: Payer: Medicare Other

## 2022-09-17 ENCOUNTER — Ambulatory Visit: Payer: Medicare Other | Admitting: Physician Assistant

## 2022-09-18 ENCOUNTER — Other Ambulatory Visit: Payer: Self-pay

## 2022-09-19 ENCOUNTER — Other Ambulatory Visit: Payer: Self-pay

## 2022-09-24 ENCOUNTER — Other Ambulatory Visit: Payer: Self-pay | Admitting: Physician Assistant

## 2022-09-24 ENCOUNTER — Inpatient Hospital Stay: Payer: Medicare Other | Attending: Physician Assistant

## 2022-09-24 ENCOUNTER — Other Ambulatory Visit: Payer: Self-pay

## 2022-09-24 ENCOUNTER — Inpatient Hospital Stay (HOSPITAL_BASED_OUTPATIENT_CLINIC_OR_DEPARTMENT_OTHER): Payer: Medicare Other | Admitting: Physician Assistant

## 2022-09-24 VITALS — BP 110/71 | HR 92 | Temp 97.5°F | Resp 18 | Ht 72.0 in | Wt 263.3 lb

## 2022-09-24 DIAGNOSIS — F17210 Nicotine dependence, cigarettes, uncomplicated: Secondary | ICD-10-CM | POA: Diagnosis not present

## 2022-09-24 DIAGNOSIS — J449 Chronic obstructive pulmonary disease, unspecified: Secondary | ICD-10-CM | POA: Insufficient documentation

## 2022-09-24 DIAGNOSIS — E1165 Type 2 diabetes mellitus with hyperglycemia: Secondary | ICD-10-CM | POA: Diagnosis not present

## 2022-09-24 DIAGNOSIS — Z86711 Personal history of pulmonary embolism: Secondary | ICD-10-CM | POA: Diagnosis not present

## 2022-09-24 DIAGNOSIS — D696 Thrombocytopenia, unspecified: Secondary | ICD-10-CM

## 2022-09-24 DIAGNOSIS — Z7901 Long term (current) use of anticoagulants: Secondary | ICD-10-CM | POA: Insufficient documentation

## 2022-09-24 DIAGNOSIS — D751 Secondary polycythemia: Secondary | ICD-10-CM | POA: Insufficient documentation

## 2022-09-24 DIAGNOSIS — Z86718 Personal history of other venous thrombosis and embolism: Secondary | ICD-10-CM | POA: Diagnosis not present

## 2022-09-24 DIAGNOSIS — Z794 Long term (current) use of insulin: Secondary | ICD-10-CM | POA: Diagnosis not present

## 2022-09-24 DIAGNOSIS — G479 Sleep disorder, unspecified: Secondary | ICD-10-CM | POA: Insufficient documentation

## 2022-09-24 DIAGNOSIS — Z7984 Long term (current) use of oral hypoglycemic drugs: Secondary | ICD-10-CM | POA: Insufficient documentation

## 2022-09-24 DIAGNOSIS — Z79899 Other long term (current) drug therapy: Secondary | ICD-10-CM | POA: Insufficient documentation

## 2022-09-24 LAB — CMP (CANCER CENTER ONLY)
ALT: 24 U/L (ref 0–44)
AST: 20 U/L (ref 15–41)
Albumin: 4.2 g/dL (ref 3.5–5.0)
Alkaline Phosphatase: 62 U/L (ref 38–126)
Anion gap: 6 (ref 5–15)
BUN: 16 mg/dL (ref 6–20)
CO2: 29 mmol/L (ref 22–32)
Calcium: 9.8 mg/dL (ref 8.9–10.3)
Chloride: 103 mmol/L (ref 98–111)
Creatinine: 1.01 mg/dL (ref 0.61–1.24)
GFR, Estimated: >60 mL/min (ref >60)
Glucose, Bld: 194 mg/dL — ABNORMAL HIGH (ref 70–99)
Potassium: 4.5 mmol/L (ref 3.5–5.1)
Sodium: 138 mmol/L (ref 135–145)
Total Bilirubin: 0.8 mg/dL (ref 0.3–1.2)
Total Protein: 7.2 g/dL (ref 6.5–8.1)

## 2022-09-24 LAB — CBC WITH DIFFERENTIAL (CANCER CENTER ONLY)
Abs Immature Granulocytes: 0.03 10*3/uL (ref 0.00–0.07)
Basophils Absolute: 0.1 10*3/uL (ref 0.0–0.1)
Basophils Relative: 1 %
Eosinophils Absolute: 0.2 10*3/uL (ref 0.0–0.5)
Eosinophils Relative: 2 %
HCT: 55 % — ABNORMAL HIGH (ref 39.0–52.0)
Hemoglobin: 19 g/dL — ABNORMAL HIGH (ref 13.0–17.0)
Immature Granulocytes: 0 %
Lymphocytes Relative: 24 %
Lymphs Abs: 2.2 10*3/uL (ref 0.7–4.0)
MCH: 28.8 pg (ref 26.0–34.0)
MCHC: 34.5 g/dL (ref 30.0–36.0)
MCV: 83.5 fL (ref 80.0–100.0)
Monocytes Absolute: 0.6 10*3/uL (ref 0.1–1.0)
Monocytes Relative: 6 %
Neutro Abs: 6.2 10*3/uL (ref 1.7–7.7)
Neutrophils Relative %: 67 %
Platelet Count: 98 10*3/uL — ABNORMAL LOW (ref 150–400)
RBC: 6.59 MIL/uL — ABNORMAL HIGH (ref 4.22–5.81)
RDW: 13.9 % (ref 11.5–15.5)
WBC Count: 9.2 10*3/uL (ref 4.0–10.5)
nRBC: 0 % (ref 0.0–0.2)

## 2022-09-24 NOTE — Progress Notes (Signed)
[facility] [phone]   [phone]  PROGRESS NOTE  Patient Care Team: Bradley [name], En[place], MD as PCP - General (Family Medicine)  Hematological/Oncological History 1) 04/21/2016: Labs showed WBC 12.0 (H), RBC 6.09 (H), Hgb 17.3 (H), Plt 132K 2) 04/19/2020: Labs showed WBC 9.5, RBC 6.29 (H), Hgb 17.8 (H), Plt 111K 3) 12/06/2020: Labs showed WBC 9.1, RBC 6.76 (H), Hgb 18.9 (H), Plt 120K 4) 12/17/2020: Established care with Bradley KaufmannIrene Kalika Smay PA-C   CHIEF COMPLAINTS: Secondary polycythemia  HISTORY OF PRESENTING ILLNESS:  Bradley [name] 41 y.o. male who returns for a follow for secondary polycythemia and thrombocytopenia. He is unaccompanied for this visit.   [name] reports he underwent sleep study on 04/25/2022. Findings revealed limb movement sleep disorder but no evidence of obstructive sleep apnea. He continues to smoke around one pack per day. He reports chronic SOB with exertion secondary to COPD. He is otherwise feeling well without any new symptoms. He denies any fevers, chills, night sweats, chest pain, cough, headaches, bleeding or easy bruising. He has no other complaints. Rest of 10 point ROS is below.   MEDICAL HISTORY:  Past Medical History:  Diagnosis Date   ADHD    ADHD (attention deficit hyperactivity disorder)    Bipolar 1 disorder ([facility])    Bipolar disorder ([facility])    Diabetes mellitus without complication ([facility])    Hyperlipidemia    Morbid obesity ([facility])    Obesity    Panic attack    Varicose veins of both lower extremities with pain     SURGICAL HISTORY: Past Surgical History:  Procedure Laterality Date   surgery on meatus as a child      SOCIAL HISTORY: Social History   Socioeconomic History   Marital status: Single    Spouse name: Not on file   Number of children: Not on file   Years of education: Not on file   Highest education level: Not on file  Occupational History   Not on file  Tobacco Use   Smoking status:  Every Day    Packs/day: 1.00    Types: Cigarettes   Smokeless tobacco: Never  Vaping Use   Vaping Use: Never used  Substance and Sexual Activity   Alcohol use: Yes    Comment: Rarely   Drug use: No   Sexual activity: Yes  Other Topics Concern   Not on file  Social History Narrative   ** Merged History Encounter **       Social Determinants of Health   Financial Resource Strain: Not on file  Food Insecurity: Not on file  Transportation Needs: Not on file  Physical Activity: Not on file  Stress: Not on file  Social Connections: Not on file  Intimate Partner Violence: Not on file    FAMILY HISTORY: Family History  Problem Relation Age of Onset   Rheum arthritis Mother    Diabetes Mother    Heart failure Mother    Stroke Mother    Glaucoma Maternal Grandmother    Lung cancer Paternal Grandmother        smoker    ALLERGIES:  is allergic to actos [pioglitazone], gabapentin, and lyrica [pregabalin].  MEDICATIONS:  Current Outpatient Medications  Medication Sig Dispense Refill   ARIPiprazole (ABILIFY) 5 MG tablet Take 1 tablet (5 mg total) by mouth at bedtime. 30 tablet 2   atorvastatin (LIPITOR) 20 MG tablet Take 1 tablet (20 mg total) by mouth daily. 90 tablet 1   Blood Glucose  Calibration (ACCU-CHEK ACTIVE GLUCOSE CONT VI) by In Vitro route.     doxepin (SINEQUAN) 50 MG capsule Take 1 capsule (50 mg total) by mouth as needed for sleep 30 capsule 2   DULoxetine (CYMBALTA) 30 MG capsule Take 30 mg by mouth daily.     DULoxetine (CYMBALTA) 60 MG capsule Take 1 capsule(s) by mouth at bedtime for anxiety/depression 30 capsule 2   empagliflozin (JARDIANCE) 25 MG TABS tablet Take 1 tablet (25 mg total) by mouth daily before breakfast. 90 tablet 3   glucose blood (ACCU-CHEK GUIDE) test strip 1 each by Other route 3 (three) times daily. Use as instructed 300 each 3   insulin degludec (TRESIBA FLEXTOUCH) 100 UNIT/ML FlexTouch Pen Inject 70 Units into the skin daily. 60 mL 6    Insulin Pen Needle (B-D UF III MINI PEN NEEDLES) 31G X 5 MM MISC Use as directed 3 times daily. 300 each 3   Menthol 7.5 % PTCH Apply 1 patch topically as needed (pain).     OneTouch Delica Lancets 33G MISC 1 Device by Does not apply route 3 (three) times daily. 300 each 3   sildenafil (VIAGRA) 50 MG tablet Take 1 tablet (50 mg total) by mouth daily as needed for erectile dysfunction. At least 24 hours between doses 10 tablet 1   traMADol (ULTRAM) 50 MG tablet Take 1 tablet (50 mg total) by mouth every 12 (twelve) hours as needed. 60 tablet 2   warfarin (COUMADIN) 2.5 MG tablet TAKE 1 TABLET BY MOUTH DAILY AS DIRECTED BY THE COUMADIN CLINIC. 90 tablet 1   albuterol (VENTOLIN HFA) 108 (90 Base) MCG/ACT inhaler INHALE 1-2 PUFFS INTO THE LUNGS EVERY 6 (SIX) HOURS AS NEEDED FOR WHEEZING OR SHORTNESS OF BREATH. 18 g 2   busPIRone (BUSPAR) 7.5 MG tablet Take 1 tablet(s) by mouth twice a day for severe anxiety/panic attacks (Patient not taking: Reported on 09/24/2022) 60 tablet 2   cefdinir (OMNICEF) 300 MG capsule Take 1 capsule (300 mg total) by mouth 2 (two) times daily. (Patient not taking: Reported on 09/24/2022) 20 capsule 0   doxepin (SINEQUAN) 50 MG capsule Take 1 capsule(s) by mouth at bedtime as needed for sleep 30 capsule 2   mupirocin ointment (BACTROBAN) 2 % Apply to right great toe once daily. (Patient not taking: Reported on 09/24/2022) 30 g 1   No current facility-administered medications for this visit.    REVIEW OF SYSTEMS:   Constitutional: ( - ) fevers, ( - )  chills , ( - ) night sweats Eyes: ( - ) blurriness of vision, ( - ) double vision, ( - ) watery eyes Ears, nose, mouth, throat, and face: ( - ) mucositis, ( - ) sore throat Respiratory: ( - ) cough, ( + ) dyspnea, ( - ) wheezes Cardiovascular: ( - ) palpitation, ( - ) chest discomfort, ( + ) lower extremity swelling Gastrointestinal:  ( - ) nausea, ( - ) heartburn, ( - ) change in bowel habits Skin: ( - ) abnormal skin  rashes Lymphatics: ( - ) new lymphadenopathy, ( - ) easy bruising Neurological: ( + ) numbness, ( - ) tingling, ( - ) new weaknesses Behavioral/Psych: ( - ) mood change, ( - ) new changes  All other systems were reviewed with the patient and are negative.  PHYSICAL EXAMINATION: ECOG PERFORMANCE STATUS: 1 - Symptomatic but completely ambulatory  Vitals:   09/24/22 1320  BP: 110/71  Pulse: 92  Resp: 18  Temp: (!) 97.5  F (36.4 C)  SpO2: 100%   Filed Weights   09/24/22 1320  Weight: 263 lb 4.8 oz (119.4 kg)    GENERAL: well appearing male in NAD  SKIN: texture, turgor are normal, no rashes or significant lesions. Venous stasis dermatitis in lower extremities EYES: conjunctiva are pink and non-injected, sclera clear  LUNGS: Bilateral, diffuse wheezing upon auscultation HEART:  Regular rhythm and no murmurs. Lower extremity edema (L > R) ABDOMEN: soft, non-tender, non-distended, normal bowel sounds Musculoskeletal: no cyanosis of digits and no clubbing  PSYCH: alert & oriented x 3, fluent speech NEURO: no focal motor/sensory deficits  LABORATORY DATA:  I have reviewed the data as listed    Latest Ref Rng & Units 09/24/2022   12:49 PM 03/19/2022    2:21 PM 09/17/2021    2:40 PM  CBC  WBC 4.0 - 10.5 K/uL 9.2  10.1  9.5   Hemoglobin 13.0 - 17.0 g/dL 00.9  23.3  00.7   Hematocrit 39.0 - 52.0 % 55.0  56.6  49.7   Platelets 150 - 400 K/uL 98  114  102        Latest Ref Rng & Units 09/24/2022   12:49 PM 03/19/2022    2:21 PM 09/17/2021    2:40 PM  CMP  Glucose 70 - 99 mg/dL 622  633  354   BUN 6 - 20 mg/dL 16  14  14    Creatinine 0.61 - 1.24 mg/dL  5.62  5.63   Sodium 135 - 145 mmol/L 138  137  136   Potassium 3.5 - 5.1 mmol/L 4.5  4.6  4.3   Chloride 98 - 111 mmol/L 103  101  99   CO2 22 - 32 mmol/L 29  25  29    Calcium 8.9 - 10.3 mg/dL 9.8  8.9  9.3   Total Protein 6.5 - 8.1 g/dL 7.2  7.7  7.0   Total Bilirubin 0.3 - 1.2 mg/dL 0.8  1.2  1.1   Alkaline Phos 38 -  126 U/L 62  70  78   AST 15 - 41 U/L 20  20  16    ALT 0 - 44 U/L 24  29  22      ASSESSMENT & PLAN Bradley [name] is a 41 y.o. male presenting to the clinic for evaluation for secondary polycythemia  #Polycythemia 2/2 smoking --MPN panel and BCR/ABL was negative.Likely etiology is smoking.  --Labs today reveal Hgb of 19.0, stable from June 2023.  --Recommend smoking cessation.  --Underwent sleep study on 04/25/2022 and there was no evidence of OSA  #Chronic smoking --Patient currently smokes 1 pack/day. Encouraged to stop smoking.   #History of DVT and PE: --05/22/2016: DVT of left lower extremity involving common femoral, femoral,popliteal and saphenofemoral veins. Treated with xarelto for one year.  --11/27/2018: Bilateral PE. Patient was treated prior with doxycycline for pneumonia. Treated PE with lovenox >> coumadin.  --His family history is remarkable for his mother who had a DVT in her 81s.  --Risk factors include venous insufficiency, smoking, and sedentary lifestyle. --Patient will remain on lifelong anticoagulation due to risk factors mentioned above, multiple DVT/PE episodes, and family history of DVT.   #Thrombocytopenia:  --Likely etiology is possible hepatic cirrhosis and splenomegaly as mentioned on CT scan from 11/27/2018.  --Patient denies any bleeding or easy bruising --Today's platelet count is 98K. Monitor for now.   #Hyperglycemia: --Today's glucose level has improved to 194. Patient has T2DM currently on insulin and jardiance.  --  Continue to monitor.   Follow up: --Discussed with Dr. Leonides Schanz. Patient can return to our clinic PRN and monitor blood counts with PCP.   No orders of the defined types were placed in this encounter.   All questions were answered. The patient knows to call the clinic with any problems, questions or concerns.  I have spent a total of 25 minutes minutes of face-to-face and non-face-to-face time, preparing to see the  patient,performing a medically appropriate examination, counseling and educating the patient,  documenting clinical information in the electronic health record, and care coordination.    Bradley Kaufmann, PA-C Department of Hematology/Oncology [facility] Phone: [phone]

## 2022-10-01 ENCOUNTER — Other Ambulatory Visit: Payer: Self-pay

## 2022-10-06 ENCOUNTER — Other Ambulatory Visit: Payer: Self-pay | Admitting: Family Medicine

## 2022-10-06 ENCOUNTER — Other Ambulatory Visit: Payer: Self-pay

## 2022-10-06 ENCOUNTER — Encounter: Payer: Self-pay | Admitting: Podiatry

## 2022-10-06 ENCOUNTER — Ambulatory Visit (INDEPENDENT_AMBULATORY_CARE_PROVIDER_SITE_OTHER): Payer: Medicare Other | Admitting: Podiatry

## 2022-10-06 VITALS — BP 127/76

## 2022-10-06 DIAGNOSIS — E1149 Type 2 diabetes mellitus with other diabetic neurological complication: Secondary | ICD-10-CM

## 2022-10-06 DIAGNOSIS — W548XXA Other contact with dog, initial encounter: Secondary | ICD-10-CM | POA: Diagnosis not present

## 2022-10-06 DIAGNOSIS — M79674 Pain in right toe(s): Secondary | ICD-10-CM

## 2022-10-06 DIAGNOSIS — E1142 Type 2 diabetes mellitus with diabetic polyneuropathy: Secondary | ICD-10-CM | POA: Diagnosis not present

## 2022-10-06 DIAGNOSIS — B351 Tinea unguium: Secondary | ICD-10-CM

## 2022-10-06 DIAGNOSIS — M79675 Pain in left toe(s): Secondary | ICD-10-CM | POA: Diagnosis not present

## 2022-10-06 MED ORDER — TRAMADOL HCL 50 MG PO TABS
50.0000 mg | ORAL_TABLET | Freq: Two times a day (BID) | ORAL | 2 refills | Status: DC | PRN
  Filled 2022-10-06: qty 60, 30d supply, fill #0
  Filled 2022-11-04: qty 60, 30d supply, fill #1
  Filled 2022-12-01: qty 60, 30d supply, fill #2

## 2022-10-06 MED ORDER — GENTAMICIN SULFATE 0.1 % EX CREA
TOPICAL_CREAM | CUTANEOUS | 1 refills | Status: DC
  Filled 2022-10-06: qty 15, 30d supply, fill #0

## 2022-10-06 NOTE — Patient Instructions (Addendum)
Clean left ankle with Dial Antibacterial soap and water. Pat dry. Apply Gentamicin Cream to left lower extremity once daily until healed.

## 2022-10-06 NOTE — Progress Notes (Signed)
  Subjective:  Patient ID: Bradley [name], male    DOB: 10/06/1980,  MRN: [id number]  Bradley [name] presents to clinic today for at risk foot care with history of diabetic neuropathy and painful thick toenails that are difficult to trim. Pain interferes with ambulation. Aggravating factors include wearing enclosed shoe gear. Pain is relieved with periodic professional debridement.  Chief Complaint  Patient presents with   Nail Problem    DFC BS-did not check today A1C-9.1 PCP-[place] PCP VST-08/04/22   New problem(s):          Patient states his dog scratched him over the holidays. He wasn't aware of the injury until his Aunt pointed it out his ankle was bleeding. He covered it with a band-aid  PCP is Charlott Rakes, MD.  Allergies  Allergen Reactions   Actos [Pioglitazone] Other (See Comments)    Stomach cramps   Gabapentin Other (See Comments)    Crying spells   Lyrica [Pregabalin] Other (See Comments)    Makes the patient somnolent    Review of Systems: Negative except as noted in the HPI.  Objective:  Vitals:   10/06/22 1608  BP: 127/76   Bradley [name] is a pleasant 42 y.o. male obese in NAD. AAO x 3. Vascular Capillary refill time to digits immediate b/l. Palpable DP pulse(s) b/l lower extremities Palpable PT pulse(s) b/l lower extremities Pedal hair absent. Lower extremity skin temperature gradient within normal limits. No pain with calf compression b/l. Nonpitting edema noted b/l lower extremities. Varicosities present b/l. No cyanosis or clubbing noted. He has hyperpigmentation consistent with venous stasis/chronic venous insufficiency b/l LE.  Neurologic Normal speech. Oriented to person, place, and time. Pt has subjective symptoms of neuropathy. Protective sensation decreased with 10 gram monofilament b/l lower extremities. Vibratory sensation intact b/l.  Dermatologic Toenails 2-5 bilaterally and L hallux elongated, discolored, dystrophic, thickened, and  crumbly with subungual debris and tenderness to dorsal palpation. Pincer nail deformity L hallux and R hallux. No erythema, no edema, no drainage, no fluctuance.   Skin tear injury associated with dog scratch noted anterior aspect left ankle, superficial in nature. No erythema, no edema, no drainage, no odor, no fluctuance and no penetration into deeper tissues.  Orthopedic: Normal muscle strength 5/5 to all lower extremity muscle groups bilaterally. No gross bony deformities b/l lower extremities. Patient ambulates independent of any assistive aids.   Assessment/Plan: 1. Pain due to onychomycosis of toenails of both feet   2. Dog scratch   3. Diabetic peripheral neuropathy associated with type 2 diabetes mellitus ([place])     Meds ordered this encounter  Medications   gentamicin cream (GARAMYCIN) 0.1 %    Sig: Apply to wound left lower extremity once daily until healed.    Dispense:  30 g    Refill:  1   -Patient was evaluated and treated. All patient's and/or POA's questions/concerns answered on today's visit. -Discussed dog scratch. Advised cleaning area and applying Gentamicin Cream once daily until healed. Patient related understanding. -Continue foot and shoe inspections daily. Monitor blood glucose per PCP/Endocrinologist's recommendations. -Toenails 1-5 b/l were debrided in length and girth with sterile nail nippers without iatrogenic bleeding. Patient declined use of dremel. -Patient/POA to call should there be question/concern in the interim.   Return in about 3 months (around 01/05/2023).  Marzetta Board, DPM

## 2022-10-07 ENCOUNTER — Other Ambulatory Visit: Payer: Self-pay

## 2022-10-08 ENCOUNTER — Other Ambulatory Visit: Payer: Self-pay

## 2022-10-09 ENCOUNTER — Ambulatory Visit: Payer: Medicare Other | Attending: Family Medicine | Admitting: Pharmacist

## 2022-10-09 DIAGNOSIS — I825Y2 Chronic embolism and thrombosis of unspecified deep veins of left proximal lower extremity: Secondary | ICD-10-CM

## 2022-10-09 LAB — POCT INR: INR: 2.9 (ref 2.0–3.0)

## 2022-10-13 DIAGNOSIS — F4312 Post-traumatic stress disorder, chronic: Secondary | ICD-10-CM | POA: Diagnosis not present

## 2022-10-13 DIAGNOSIS — F3132 Bipolar disorder, current episode depressed, moderate: Secondary | ICD-10-CM | POA: Diagnosis not present

## 2022-10-13 DIAGNOSIS — F411 Generalized anxiety disorder: Secondary | ICD-10-CM | POA: Diagnosis not present

## 2022-10-15 ENCOUNTER — Other Ambulatory Visit: Payer: Self-pay

## 2022-11-04 ENCOUNTER — Other Ambulatory Visit: Payer: Self-pay

## 2022-11-10 ENCOUNTER — Other Ambulatory Visit: Payer: Self-pay

## 2022-11-11 ENCOUNTER — Ambulatory Visit: Payer: Medicare Other | Attending: Family Medicine | Admitting: Pharmacist

## 2022-11-11 ENCOUNTER — Ambulatory Visit
Admission: EM | Admit: 2022-11-11 | Discharge: 2022-11-11 | Disposition: A | Discharge status: Home / Self Care | Payer: Medicare Other | Attending: Family Medicine | Admitting: Family Medicine

## 2022-11-11 DIAGNOSIS — H6123 Impacted cerumen, bilateral: Secondary | ICD-10-CM | POA: Diagnosis not present

## 2022-11-11 DIAGNOSIS — H60392 Other infective otitis externa, left ear: Secondary | ICD-10-CM | POA: Diagnosis not present

## 2022-11-11 DIAGNOSIS — I825Y2 Chronic embolism and thrombosis of unspecified deep veins of left proximal lower extremity: Secondary | ICD-10-CM

## 2022-11-11 DIAGNOSIS — H6692 Otitis media, unspecified, left ear: Secondary | ICD-10-CM

## 2022-11-11 LAB — POCT INR: POC INR: 2.2

## 2022-11-11 MED ORDER — OFLOXACIN 0.3 % OT SOLN: 5.0000 [drp] | Freq: Two times a day (BID) | OTIC | 0 refills | Status: AC

## 2022-11-11 MED ORDER — CEFDINIR 300 MG PO CAPS: 600.0000 mg | ORAL_CAPSULE | Freq: Every day | ORAL | 0 refills | Status: AC

## 2022-11-11 NOTE — ED Provider Notes (Signed)
[facility]    CSN: [id number] Arrival date & time: 11/11/22  1557      History   Chief Complaint Chief Complaint  Patient presents with   Ear Fullness    HPI Bradley [name] is a 42 y.o. male.    Ear Fullness   Here for muffled hearing in his left ear.  There is maybe a little fullness or pain there.  He also has slightly muffled hearing in his right ear.  No fever or cough or congestion  Past Medical History:  Diagnosis Date   ADHD    ADHD (attention deficit hyperactivity disorder)    Bipolar 1 disorder ([place])    Bipolar disorder ([place])    Diabetes mellitus without complication ([place])    Hyperlipidemia    Morbid obesity ([place])    Obesity    Panic attack    Varicose veins of both lower extremities with pain     Patient Active Problem List   Diagnosis Date Noted   Hyperglycemia 03/19/2021   Polycythemia 12/17/2020   Hypertriglyceridemia 10/18/2020   Pain due to onychomycosis of toenails of both feet 09/28/2019   Diabetes mellitus without complication ([place]) [id number]   Venous stasis dermatitis of left lower extremity 09/28/2019   Pulmonary emboli ([place]) 11/27/2018   Splenomegaly 11/27/2018   Focal nodular hyperplasia of liver 11/27/2018   Diabetic neuropathy ([place]) 05/12/2018   Chronic pain syndrome 05/20/2017   Bipolar disorder, current episode mixed, moderate ([place]) 03/04/2017   Varicose veins of both lower extremities with pain 02/18/2017   Chronic deep vein thrombosis (DVT) of proximal vein of left lower extremity ([place]) 11/12/2016   Smoking addiction 06/19/2016   HTN (hypertension) 07/27/2013   Morbid obesity ([place]) 07/27/2013   Bipolar 1 disorder, mixed, moderate ([place]) 07/27/2013   DM2 (diabetes mellitus, type 2) ([place]) 04/26/2013    Past Surgical History:  Procedure Laterality Date   surgery on meatus as a child         Home Medications    Prior to Admission medications   Medication Sig Start Date End Date Taking? Authorizing  Provider  cefdinir (OMNICEF) 300 MG capsule Take 2 capsules (600 mg total) by mouth daily for 7 days. 11/11/22 11/18/22 Yes Barrett Henle, MD  ofloxacin (FLOXIN) 0.3 % OTIC solution Place 5 drops into both ears 2 (two) times daily for 7 days. 11/11/22 11/18/22 Yes [place] Windt, Gwenlyn Perking, MD  albuterol (VENTOLIN HFA) 108 (90 Base) MCG/ACT inhaler INHALE 1-2 PUFFS INTO THE LUNGS EVERY 6 (SIX) HOURS AS NEEDED FOR WHEEZING OR SHORTNESS OF BREATH. 08/09/20 08/09/21  Charlott Rakes, MD  ARIPiprazole (ABILIFY) 5 MG tablet Take 1 tablet (5 mg total) by mouth at bedtime. 05/31/21     atorvastatin (LIPITOR) 20 MG tablet Take 1 tablet (20 mg total) by mouth daily. 06/30/22   Charlott Rakes, MD  Blood Glucose Calibration (ACCU-CHEK ACTIVE GLUCOSE CONT VI) by In Vitro route.    [provider]  doxepin (SINEQUAN) 50 MG capsule Take 1 capsule (50 mg total) by mouth as needed for sleep 05/31/21     DULoxetine (CYMBALTA) 30 MG capsule Take 30 mg by mouth daily.    [provider]  DULoxetine (CYMBALTA) 60 MG capsule Take 1 capsule(s) by mouth at bedtime for anxiety/depression 07/31/21     empagliflozin (JARDIANCE) 25 MG TABS tablet Take 1 tablet (25 mg total) by mouth daily before breakfast. 09/10/22   Shamleffer, Melanie Crazier, MD  gentamicin cream (GARAMYCIN) 0.1 % Apply to wound  left lower extremity once daily until healed. 10/06/22   Marzetta Board, DPM  glucose blood (ACCU-CHEK GUIDE) test strip 1 each by Other route 3 (three) times daily. Use as instructed 12/20/21   Shamleffer, Melanie Crazier, MD  insulin degludec (TRESIBA FLEXTOUCH) 100 UNIT/ML FlexTouch Pen Inject 70 Units into the skin daily. 09/10/22   Shamleffer, Melanie Crazier, MD  Insulin Pen Needle (B-D UF III MINI PEN NEEDLES) 31G X 5 MM MISC Use as directed 3 times daily. 09/10/22   Shamleffer, Melanie Crazier, MD  Menthol 7.5 % PTCH Apply 1 patch topically as needed (pain).    [provider]  mupirocin ointment  (BACTROBAN) 2 % Apply to right great toe once daily. Patient not taking: Reported on 09/24/2022 11/19/21   Marzetta Board, DPM  OneTouch Delica Lancets [id number] MISC 1 Device by Does not apply route 3 (three) times daily. 08/16/21   Shamleffer, Melanie Crazier, MD  sildenafil (VIAGRA) 50 MG tablet Take 1 tablet (50 mg total) by mouth daily as needed for erectile dysfunction. At least 24 hours between doses 05/03/20   Charlott Rakes, MD  traMADol (ULTRAM) 50 MG tablet Take 1 tablet (50 mg total) by mouth every 12 (twelve) hours as needed. 10/06/22 04/06/23  Charlott Rakes, MD  warfarin (COUMADIN) 2.5 MG tablet TAKE 1 TABLET BY MOUTH DAILY AS DIRECTED BY THE COUMADIN CLINIC. 06/20/22   Charlott Rakes, MD    Family History Family History  Problem Relation Age of Onset   Rheum arthritis Mother    Diabetes Mother    Heart failure Mother    Stroke Mother    Glaucoma Maternal Grandmother    Lung cancer Paternal Grandmother        smoker    Social History Social History   Tobacco Use   Smoking status: Every Day    Packs/day: 1.00    Types: Cigarettes   Smokeless tobacco: Never  Vaping Use   Vaping Use: Never used  Substance Use Topics   Alcohol use: Yes    Comment: Rarely   Drug use: No     Allergies   Actos [pioglitazone], Gabapentin, and Lyrica [pregabalin]   Review of Systems Review of Systems   Physical Exam Triage Vital Signs ED Triage Vitals  Enc Vitals Group     BP 11/11/22 1645 104/72     Pulse Rate 11/11/22 1645 (!) 102     Resp 11/11/22 1645 18     Temp 11/11/22 1645 (!) 97.4 F (36.3 C)     Temp Source 11/11/22 1645 Oral     SpO2 11/11/22 1645 98 %     Weight --      Height --      Head Circumference --      Peak Flow --      Pain Score 11/11/22 1644 2     Pain Loc --      Pain Edu? --      Excl. in [place]? --    No data found.  Updated Vital Signs BP 104/72 (BP Location: Left Arm)   Pulse (!) 102   Temp (!) 97.4 F (36.3 C) (Oral)   Resp 18   SpO2  98%   Visual Acuity Right Eye Distance:   Left Eye Distance:   Bilateral Distance:    Right Eye Near:   Left Eye Near:    Bilateral Near:     Physical Exam Vitals reviewed.  Constitutional:      General: He is  not in acute distress.    Appearance: He is not ill-appearing, toxic-appearing or diaphoretic.  HENT:     Ears:     Comments: Initially bilaterally but the tympanic membrane's are obscured by light brown cerumen. Skin:    Capillary Refill: Capillary refill takes less than 2 seconds.     Coloration: Skin is not jaundiced or pale.  Neurological:     Mental Status: He is alert and oriented to person, place, and time.      UC Treatments / Results  Labs (all labs ordered are listed, but only abnormal results are displayed) Labs Reviewed - No data to display  EKG   Radiology No results found.  Procedures Procedures (including critical care time)  Medications Ordered in UC Medications - No data to display  Initial Impression / Assessment and Plan / UC Course  I have reviewed the triage vital signs and the nursing notes.  Pertinent labs & imaging results that were available during my care of the patient were reviewed by me and considered in my medical decision making (see chart for details).        Nursing staff did an ear lavage of both ears.  After lavage the right ear is completely clear and he can hear well out of it.  On the left there is a little bleeding in the ear canal, and the ear canal walls are red and swollen.  It is difficult to visualize tympanic membrane.  I am going to treat for otitis media and otitis externa. Final Clinical Impressions(s) / UC Diagnoses   Final diagnoses:  Bilateral impacted cerumen  Infective otitis externa of left ear  Left otitis media, unspecified otitis media type     Discharge Instructions      Take cefdinir 300 mg--2 capsules together daily for 7 days  -Floxin eardrops-50s in the affected ear 2 times  daily for 7 days.       ED Prescriptions     Medication Sig Dispense Auth. Provider   cefdinir (OMNICEF) 300 MG capsule Take 2 capsules (600 mg total) by mouth daily for 7 days. 14 capsule Najiyah Paris, Gwenlyn Perking, MD   ofloxacin (FLOXIN) 0.3 % OTIC solution Place 5 drops into both ears 2 (two) times daily for 7 days. 5 mL Barrett Henle, MD      PDMP not reviewed this encounter.   Barrett Henle, MD 11/11/22 1725

## 2022-11-11 NOTE — Discharge Instructions (Signed)
Take cefdinir 300 mg--2 capsules together daily for 7 days  -Floxin eardrops-50s in the affected ear 2 times daily for 7 days.

## 2022-11-11 NOTE — ED Triage Notes (Signed)
Pt presents with left ear fullness since waking up this morning.

## 2022-11-13 ENCOUNTER — Ambulatory Visit: Payer: Medicare Other | Admitting: Family Medicine

## 2022-12-01 ENCOUNTER — Other Ambulatory Visit: Payer: Self-pay

## 2022-12-08 ENCOUNTER — Encounter: Payer: Self-pay | Admitting: Dietician

## 2022-12-08 ENCOUNTER — Encounter: Payer: Medicare Other | Attending: Internal Medicine | Admitting: Dietician

## 2022-12-08 VITALS — Wt 262.0 lb

## 2022-12-08 DIAGNOSIS — E1165 Type 2 diabetes mellitus with hyperglycemia: Secondary | ICD-10-CM | POA: Diagnosis not present

## 2022-12-08 DIAGNOSIS — Z794 Long term (current) use of insulin: Secondary | ICD-10-CM | POA: Insufficient documentation

## 2022-12-08 NOTE — Progress Notes (Signed)
Diabetes Self-Management Education  Visit Type: Follow-up  Appt. Start Time: 1400 Appt. End Time: 1430  12/08/2022  [name], identified by name and date of birth, is a 42 y.o. male with a diagnosis of Diabetes:  .   ASSESSMENT Patient is here today with his girlfriend.  He was last seen by this RD on 09/09/2023.  He is not taking his meal time insulin as "I don't need it." - Reviewed A1C goal, reasons for blood glucose control to decrease/avoid complications.  He is not checking his blood glucose. Glucose 222 in the office today 1 1/2 hours after 6 mini donuts. Avoids sweetened drinks most of the time now. He is not exercising as he states it worsens his neuropathy.    History includes Type 2 Diabetes (2019), DVT, Bipolar, ADHD, neuropathy, smoking, memory issues, polycythemia, thrombocytopenia, history of DVT/PE Labs noted to include:  A1C  9.4% 08/04/2022, 9.8% 04/23/2022 decreased from 12.7% 08/16/2021,  cholesterol 193, Triglycerides 365, HDL 40, LDL 92 08/09/2020 Medications include:Trulicity(stopped), glipizide (discontinued), actos (discontinued), Jardiance, Tresiba 70 units q HS, Novolog before meals (not taking), coumadin, Abilify and other medications that contribute to weight gain. He stopped pioglitizone after 3 days due to stomach cramps. He could not tolerated Metformin due to bowel incontinence.    Sleep:  Eats late (10pm - midnight).  Goes to sleep late.  Wakes about 10:30 am (although later now) Support:  [place], patient of Dr. Kelton Pillar at [facility], PCP Bradley Rakes, MD   Weight: 262 lbs 12/08/2022 260 lbs 09/08/2022 261 lbs 07/21/2022 263 lbs 04/21/2022 278 lbs 12/19/2021 279 lbs 10/24/2021 282 lbs 08/16/2021 loss likely due to uncontrolled blood glucose 295 lbs 02/14/21 306 lbs 47/2022 306 lbs 11/23/2020 425 lbs highest adult weight 2 years ago (loss presumably due to uncontrolled blood  sugar) Lowest adult weight 235 lbs about 8 years ago and he does not recall circumstances of increased weight gain.   Patient lives with his girlfriend.  She has type 2 diabetes.  He does the shopping and cooking.  Dislikes most vegetables. Drinking mostly sugar free options. He is on disability.  Finances are a concern. His mother passed away [id number] from complications of diabetes. Girlfriend  goes to the gym 2-3 times per week [facility]).  Patient is not motivated to go. Complains of neuropathy pain which worsens with exercise.  Weight 262 lb (118.8 kg). Body mass index is 35.53 kg/m.   Diabetes Self-Management Education - 12/08/22 1400       Visit Information   Visit Type Follow-up      Psychosocial Assessment   Patient Belief/Attitude about Diabetes Motivated to manage diabetes    What is the hardest part about your diabetes right now, causing you the most concern, or is the most worrisome to you about your diabetes?   Making healty food and beverage choices;Checking blood sugar;Being active    Self-care barriers Lack of material resources    Self-management support Doctor's office;CDE visits    Other persons present Patient;Spouse/SO    Patient Concerns Nutrition/Meal planning;Glycemic Control;Weight Control    Special Needs None    Preferred Learning Style No preference indicated    Learning Readiness Ready      Pre-Education Assessment   Patient understands the diabetes disease and treatment process. Comprehends key points    Patient understands incorporating nutritional management into lifestyle. Needs Review    Patient undertands incorporating physical activity into lifestyle. Needs Review  Patient understands using medications safely. Needs Review    Patient understands monitoring blood glucose, interpreting and using results Needs Review    Patient understands prevention, detection, and treatment of acute complications. Comprehends key points    Patient understands  prevention, detection, and treatment of chronic complications. Needs Instruction    Patient understands how to develop strategies to address psychosocial issues. Needs Review    Patient understands how to develop strategies to promote health/change behavior. Needs Review      Complications   Last HgB A1C per patient/outside source 9.4 %      Dietary Intake   Breakfast 6 mini donuts    Lunch 3 chicken strips    Dinner pan seared pork chop, 1 cup rice    Snack (evening) 2 sugar free pudding    Beverage(s) diet Mt. Dew, zero sugar Pepsi, rare sweet tea, occasional water with sugar free flavor, packs, sugar free iced vanilla latte      Activity / Exercise   Activity / Exercise Type ADL's      Patient Education   Previous Diabetes Education Yes (please comment)   08/2022   Healthy Eating Meal options for control of blood glucose level and chronic complications.    Being Active Role of exercise on diabetes management, blood pressure control and cardiac health.;Helped patient identify appropriate exercises in relation to his/her diabetes, diabetes complications and other health issue.    Medications Reviewed patients medication for diabetes, action, purpose, timing of dose and side effects.    Monitoring Taught/evaluated SMBG meter.    Chronic complications Relationship between chronic complications and blood glucose control    Lifestyle and Health Coping Lifestyle issues that need to be addressed for better diabetes care      Individualized Goals (developed by patient)   Nutrition General guidelines for healthy choices and portions discussed    Physical Activity Exercise 3-5 times per week;30 minutes per day    Medications take my medication as prescribed    Monitoring  Test my blood glucose as discussed    Problem Solving Eating Pattern      Patient Self-Evaluation of Goals - Patient rates self as meeting previously set goals (% of time)   Nutrition 25 - 50% (sometimes)    Physical  Activity < 25% (hardly ever/never)    Medications >75% (most of the time)    Monitoring < 25% (hardly ever/never)    Problem Solving and behavior change strategies  25 - 50% (sometimes)    Reducing Risk (treating acute and chronic complications) 25 - [id number] (sometimes)    Health Coping 50 - 75 % (half of the time)      Post-Education Assessment   Patient understands the diabetes disease and treatment process. Comprehends key points    Patient understands incorporating nutritional management into lifestyle. Needs Review    Patient undertands incorporating physical activity into lifestyle. Comprehends key points    Patient understands using medications safely. Comphrehends key points    Patient understands monitoring blood glucose, interpreting and using results Comprehends key points    Patient understands prevention, detection, and treatment of acute complications. Comprehends key points    Patient understands prevention, detection, and treatment of chronic complications. Comprehends key points    Patient understands how to develop strategies to address psychosocial issues. Comprehends key points    Patient understands how to develop strategies to promote health/change behavior. Comprehends key points      Outcomes   Expected Outcomes Demonstrated interest in  learning. Expect positive outcomes    Future DMSE 2 months    Program Status Not Completed      Subsequent Visit   Since your last visit have you experienced any weight changes? Gain    Weight Loss (lbs) 2             Individualized Plan for Diabetes Self-Management Training:   Learning Objective:  Patient will have a greater understanding of diabetes self-management. Patient education plan is to attend individual and/or group sessions per assessed needs and concerns.   Plan:   Patient Instructions  Consider checking your blood glucose at before dinner and 2 hours after starting dinner.  It should rise only about 60  points.  Great job on taking your medication more consistently!  Keep this up. Continue to choose beverages without sugar Consider some upper body exercises - bands, light weights, floor exercises, etc.  Quit smoking classes:  Call [phone] or visit our website at [email or web address] Or 1800-QUIT NOW.  Patient goals: Exercise most days Checking blood glucose more regularly Smoking cessation    Expected Outcomes:  Demonstrated interest in learning. Expect positive outcomes  Education material provided:   If problems or questions, patient to contact team via:  Phone  Future DSME appointment: 2 months

## 2022-12-08 NOTE — Patient Instructions (Addendum)
Consider checking your blood glucose at before dinner and 2 hours after starting dinner.  It should rise only about 60 points.  Great job on taking your medication more consistently!  Keep this up. Continue to choose beverages without sugar Consider some upper body exercises - bands, light weights, floor exercises, etc.  Quit smoking classes:  Call [phone] or visit our website at [email or web address] Or 1800-[email or web address] NOW.  Patient goals: Exercise most days Checking blood glucose more regularly Smoking cessation

## 2022-12-16 ENCOUNTER — Other Ambulatory Visit: Payer: Self-pay

## 2022-12-16 ENCOUNTER — Ambulatory Visit: Payer: Medicare Other | Attending: Family Medicine | Admitting: Pharmacist

## 2022-12-16 DIAGNOSIS — I825Y2 Chronic embolism and thrombosis of unspecified deep veins of left proximal lower extremity: Secondary | ICD-10-CM | POA: Diagnosis not present

## 2022-12-16 LAB — POCT INR: POC INR: 2.9

## 2022-12-16 MED ORDER — WARFARIN SODIUM 2.5 MG PO TABS
2.5000 mg | ORAL_TABLET | Freq: Every day | ORAL | 1 refills | Status: DC
  Filled 2022-12-16: qty 90, 90d supply, fill #0
  Filled 2023-03-12: qty 90, 90d supply, fill #1

## 2022-12-27 ENCOUNTER — Other Ambulatory Visit: Payer: Self-pay | Admitting: Family Medicine

## 2022-12-27 DIAGNOSIS — E1149 Type 2 diabetes mellitus with other diabetic neurological complication: Secondary | ICD-10-CM

## 2022-12-27 DIAGNOSIS — E781 Pure hyperglyceridemia: Secondary | ICD-10-CM

## 2022-12-29 ENCOUNTER — Other Ambulatory Visit: Payer: Self-pay

## 2022-12-29 ENCOUNTER — Telehealth: Payer: Self-pay | Admitting: *Deleted

## 2022-12-29 MED ORDER — ATORVASTATIN CALCIUM 20 MG PO TABS
20.0000 mg | ORAL_TABLET | Freq: Every day | ORAL | 2 refills | Status: DC
  Filled 2022-12-29: qty 90, 90d supply, fill #0

## 2022-12-29 NOTE — Telephone Encounter (Signed)
Requested Prescriptions  Pending Prescriptions Disp Refills   atorvastatin (LIPITOR) 20 MG tablet 90 tablet 2    Sig: Take 1 tablet (20 mg total) by mouth daily.     Cardiovascular:  Antilipid - Statins Failed - 12/27/2022 12:09 PM      Failed - Lipid Panel in normal range within the last 12 months    Cholesterol, Total  Date Value Ref Range Status  04/22/2022 114 100 - 199 mg/dL Final   LDL Chol Calc (NIH)  Date Value Ref Range Status  04/22/2022 49 0 - 99 mg/dL Final   HDL  Date Value Ref Range Status  04/22/2022 41 >39 mg/dL Final   Triglycerides  Date Value Ref Range Status  04/22/2022 140 0 - 149 mg/dL Final         Passed - Patient is not pregnant      Passed - Valid encounter within last 12 months    Recent Outpatient Visits           4 months ago Type 2 diabetes mellitus with hyperglycemia, with long-term current use of insulin ([place])   [place], [place], MD   8 months ago Other diabetic neurological complication associated with type 2 diabetes mellitus ([place])   [place], Charlane Ferretti, MD   1 year ago Other diabetic neurological complication associated with type 2 diabetes mellitus [facility])   [place] Charlott Rakes, MD   1 year ago Hypertriglyceridemia   [place], Charlane Ferretti, MD   2 years ago Type 2 diabetes mellitus with hyperglycemia, with long-term current use of insulin ([place])   [place], Charlane Ferretti, MD       Future Appointments             In 1 month Charlott Rakes, MD [place]             traMADol (ULTRAM) 50 MG tablet 60 tablet 2    Sig: Take 1 tablet (50 mg total) by mouth every 12 (twelve) hours as needed.     Not Delegated - Analgesics:  Opioid Agonists Failed - 12/27/2022 12:09 PM      Failed - This  refill cannot be delegated      Failed - Urine Drug Screen completed in last 360 days      Failed - Valid encounter within last 3 months    Recent Outpatient Visits           4 months ago Type 2 diabetes mellitus with hyperglycemia, with long-term current use of insulin ([place])   [place], [place], MD   8 months ago Other diabetic neurological complication associated with type 2 diabetes mellitus [facility])   [place], Charlane Ferretti, MD   1 year ago Other diabetic neurological complication associated with type 2 diabetes mellitus [facility])   [place] Charlott Rakes, MD   1 year ago Hypertriglyceridemia   [place], Charlane Ferretti, MD   2 years ago Type 2 diabetes mellitus with hyperglycemia, with long-term current use of insulin [facility])   [place] Charlott Rakes, MD       Future Appointments  In 1 month Charlott Rakes, MD [place]

## 2022-12-29 NOTE — Telephone Encounter (Signed)
Requested medications are due for refill today.  yes  Requested medications are on the active medications list.  yes  Last refill. 10/06/2022 #60 2 rf  Future visit scheduled.   yes  Notes to clinic.  Refill not delegated.    Requested Prescriptions  Pending Prescriptions Disp Refills   traMADol (ULTRAM) 50 MG tablet 60 tablet 2    Sig: Take 1 tablet (50 mg total) by mouth every 12 (twelve) hours as needed.     Not Delegated - Analgesics:  Opioid Agonists Failed - 12/27/2022 12:09 PM      Failed - This refill cannot be delegated      Failed - Urine Drug Screen completed in last 360 days      Failed - Valid encounter within last 3 months    Recent Outpatient Visits           4 months ago Type 2 diabetes mellitus with hyperglycemia, with long-term current use of insulin ([place])   [place], [place], MD   8 months ago Other diabetic neurological complication associated with type 2 diabetes mellitus [facility])   [place], Charlane Ferretti, MD   1 year ago Other diabetic neurological complication associated with type 2 diabetes mellitus [facility])   [place] Charlott Rakes, MD   1 year ago Hypertriglyceridemia   [place], Charlane Ferretti, MD   2 years ago Type 2 diabetes mellitus with hyperglycemia, with long-term current use of insulin [facility])   [place], [place], MD       Future Appointments             In 1 month [place], [place], MD [place]            Signed Prescriptions Disp Refills   atorvastatin (LIPITOR) 20 MG tablet 90 tablet 2    Sig: Take 1 tablet (20 mg total) by mouth daily.     Cardiovascular:  Antilipid - Statins Failed - 12/27/2022 12:09 PM      Failed - Lipid Panel in normal range within the last 12 months    Cholesterol, Total   Date Value Ref Range Status  04/22/2022 114 100 - 199 mg/dL Final   LDL Chol Calc (NIH)  Date Value Ref Range Status  04/22/2022 49 0 - 99 mg/dL Final   HDL  Date Value Ref Range Status  04/22/2022 41 >39 mg/dL Final   Triglycerides  Date Value Ref Range Status  04/22/2022 140 0 - 149 mg/dL Final         Passed - Patient is not pregnant      Passed - Valid encounter within last 12 months    Recent Outpatient Visits           4 months ago Type 2 diabetes mellitus with hyperglycemia, with long-term current use of insulin ([place])   [place], [place], MD   8 months ago Other diabetic neurological complication associated with type 2 diabetes mellitus [facility])   [place], Charlane Ferretti, MD   1 year ago Other diabetic neurological complication associated with type 2 diabetes mellitus [facility])   [place] Charlott Rakes, MD   1 year ago Hypertriglyceridemia   [place]  Charlott Rakes, MD   2 years ago Type 2 diabetes mellitus with hyperglycemia, with long-term current use of insulin [facility])   [place], MD       Future Appointments             In 1 month Charlott Rakes, MD [place]

## 2022-12-29 NOTE — Progress Notes (Signed)
  Care Coordination   Note   12/29/2022 Name: Bradley [name] MRN: [id number] DOB: 09-10-1981  Bradley [name] is a 42 y.o. year old male who sees Charlott Rakes, MD for primary care. I reached out to Bradley [name] by phone today to offer care coordination services.  Mr. Hoey was given information about Care Coordination services today including:   The Care Coordination services include support from the care team which includes your Nurse Coordinator, Clinical Social Worker, or Pharmacist.  The Care Coordination team is here to help remove barriers to the health concerns and goals most important to you. Care Coordination services are voluntary, and the patient may decline or stop services at any time by request to their care team member.   Care Coordination Consent Status: Patient agreed to services and verbal consent obtained.   Follow up plan:  Home Visit appointment with care team member scheduled for:  01/06/23  Encounter Outcome:  Pt. Scheduled  [place] is address for pt other address is for mailing Initial Home visit  [place]  Direct Dial: [phone]

## 2022-12-30 ENCOUNTER — Other Ambulatory Visit: Payer: Self-pay

## 2022-12-30 MED ORDER — TRAMADOL HCL 50 MG PO TABS
50.0000 mg | ORAL_TABLET | Freq: Two times a day (BID) | ORAL | 2 refills | Status: DC | PRN
  Filled 2022-12-30: qty 60, 30d supply, fill #0
  Filled 2023-01-27: qty 60, 30d supply, fill #1
  Filled 2023-02-24: qty 60, 30d supply, fill #2

## 2023-01-06 ENCOUNTER — Encounter: Payer: Self-pay | Admitting: *Deleted

## 2023-01-06 ENCOUNTER — Ambulatory Visit: Payer: Self-pay | Admitting: *Deleted

## 2023-01-06 NOTE — Patient Outreach (Signed)
  Care Coordination   Initial Visit Note   01/06/2023 Name: Bradley [name] MRN: [id number] DOB: 01-29-1981  Bradley [name] is a 42 y.o. year old male who sees Hoy Register, MD for primary care. I visited  Bradley [name] in their home today.  What matters to the patients health and wellness today?  Working on improving my health.    Nfbs 284, last HgbA1C 9.4   Goals Addressed             This Visit's Progress    I would like to work on reducing my HgbA1C from 9.4 to <8.0 over the next year.Patient Stated       Interventions Today    Flowsheet Row Most Recent Value  Chronic Disease   Chronic disease during today's visit Diabetes, Other  [Morbid Obesity, Smoker]  General Interventions   General Interventions Discussed/Reviewed General Interventions Discussed, General Interventions Reviewed, Annual Eye Exam, Labs, Annual Foot Exam, Lipid Profile, Durable Medical Equipment (DME), Vaccines, Health Screening, Sick Day Rules, [facility], Doctor Visits  Labs Hgb A1c every 3 months  Vaccines COVID-19, Flu, Pneumonia, RSV, Shingles, Tetanus/Pertussis/Diphtheria  Doctor Visits Discussed/Reviewed Doctor Visits Discussed, Doctor Visits Reviewed, Annual Wellness Visits, PCP, Specialist  Health Screening Colonoscopy, Prostate  Durable Medical Equipment (DME) Glucomoter  Exercise Interventions   Exercise Discussed/Reviewed Exercise Discussed, Weight Managment  Weight Management Weight loss  Education Interventions   Education Provided Provided Education  Provided Verbal Education On Nutrition, Foot Care, Eye Care, Labs, Blood Sugar Monitoring, Exercise, Medication, When to see the doctor  Labs Reviewed Hgb A1c, Kidney Function, Lipid Profile  Mental Health Interventions   Mental Health Discussed/Reviewed Mental Health Discussed  [Feels like he is in a good state of mind.]  Nutrition Interventions   Nutrition Discussed/Reviewed Nutrition Discussed, Carbohydrate meal  planning, Portion sizes, Decreasing fats  Pharmacy Interventions   Pharmacy Dicussed/Reviewed Pharmacy Topics Discussed, Medication Adherence  Safety Interventions   Safety Discussed/Reviewed Safety Discussed, Safety Reviewed, Fall Risk, Home Safety  Home Safety Assistive Devices  Advanced Directive Interventions   Advanced Directives Discussed/Reviewed Advanced Directives Discussed              SDOH assessments and interventions completed:  Yes  Previously addressed.   Care Coordination Interventions:  Yes, provided   Follow up plan:  New Care Manager, Kemper Durie, RN to schedule all in one month.    Encounter Outcome:  Pt. Visit Completed   Noralyn Pick C. Burgess Estelle, MSN, [facility] Gerontological Nurse Practitioner [facility] Care Management [phone]

## 2023-01-13 ENCOUNTER — Encounter: Payer: Self-pay | Admitting: Podiatry

## 2023-01-13 ENCOUNTER — Ambulatory Visit (INDEPENDENT_AMBULATORY_CARE_PROVIDER_SITE_OTHER): Payer: Medicare Other | Admitting: Podiatry

## 2023-01-13 VITALS — BP 115/73

## 2023-01-13 DIAGNOSIS — M79675 Pain in left toe(s): Secondary | ICD-10-CM | POA: Diagnosis not present

## 2023-01-13 DIAGNOSIS — B351 Tinea unguium: Secondary | ICD-10-CM | POA: Diagnosis not present

## 2023-01-13 DIAGNOSIS — E1142 Type 2 diabetes mellitus with diabetic polyneuropathy: Secondary | ICD-10-CM

## 2023-01-13 DIAGNOSIS — E119 Type 2 diabetes mellitus without complications: Secondary | ICD-10-CM

## 2023-01-13 DIAGNOSIS — M79674 Pain in right toe(s): Secondary | ICD-10-CM

## 2023-01-13 DIAGNOSIS — L608 Other nail disorders: Secondary | ICD-10-CM

## 2023-01-15 ENCOUNTER — Ambulatory Visit: Payer: Medicare Other | Attending: Family Medicine | Admitting: Pharmacist

## 2023-01-15 DIAGNOSIS — I2699 Other pulmonary embolism without acute cor pulmonale: Secondary | ICD-10-CM | POA: Diagnosis not present

## 2023-01-15 DIAGNOSIS — I825Y2 Chronic embolism and thrombosis of unspecified deep veins of left proximal lower extremity: Secondary | ICD-10-CM | POA: Diagnosis not present

## 2023-01-15 LAB — POCT INR: POC INR: 2.4

## 2023-01-17 NOTE — Progress Notes (Signed)
ANNUAL DIABETIC FOOT EXAM  Subjective: Bradley [name] presents today for annual diabetic foot examination.  Chief Complaint  Patient presents with   Nail Problem    [facility] BS-287 A1C-9.4 PCP-[facility] PCP VST-07/2022   Patient confirms h/o diabetes.  Patient relates 5 year h/o diabetes.  Patient denies any h/o foot wounds.  Patient has been diagnosed with neuropathy.  Risk factors: diabetes, neuropathy, h/o DVT, h/o PE, chronic lower extremity edema, HTN, hyperlipidemia, tobacco dependence.  Hoy Register, MD is patient's PCP.  Past Medical History:  Diagnosis Date   ADHD    ADHD (attention deficit hyperactivity disorder)    Bipolar 1 disorder    Bipolar disorder    Diabetes mellitus without complication    Hyperlipidemia    Morbid obesity    Obesity    Panic attack    Varicose veins of both lower extremities with pain    Patient Active Problem List   Diagnosis Date Noted   Hyperglycemia 03/19/2021   Polycythemia 12/17/2020   Hypertriglyceridemia 10/18/2020   Pain due to onychomycosis of toenails of both feet 09/28/2019   Diabetes mellitus without complication 09/28/2019   Venous stasis dermatitis of left lower extremity 09/28/2019   Pulmonary emboli 11/27/2018   Splenomegaly 11/27/2018   Focal nodular hyperplasia of liver 11/27/2018   Diabetic neuropathy 05/12/2018   Chronic pain syndrome 05/20/2017   Bipolar disorder, current episode mixed, moderate 03/04/2017   Varicose veins of both lower extremities with pain 02/18/2017   Chronic deep vein thrombosis (DVT) of proximal vein of left lower extremity 11/12/2016   Smoking addiction 06/19/2016   HTN (hypertension) 07/27/2013   Morbid obesity 07/27/2013   Bipolar 1 disorder, mixed, moderate 07/27/2013   DM2 (diabetes mellitus, type 2) 04/26/2013   Past Surgical History:  Procedure Laterality Date   surgery on meatus as a child     Current Outpatient Medications on File Prior to Visit  Medication Sig  Dispense Refill   albuterol (VENTOLIN HFA) 108 (90 Base) MCG/ACT inhaler INHALE 1-2 PUFFS INTO THE LUNGS EVERY 6 (SIX) HOURS AS NEEDED FOR WHEEZING OR SHORTNESS OF BREATH. 18 g 2   ARIPiprazole (ABILIFY) 5 MG tablet Take 1 tablet (5 mg total) by mouth at bedtime. 30 tablet 2   atorvastatin (LIPITOR) 20 MG tablet Take 1 tablet (20 mg total) by mouth daily. 90 tablet 2   Blood Glucose Calibration (ACCU-CHEK ACTIVE GLUCOSE CONT VI) by In Vitro route.     doxepin (SINEQUAN) 50 MG capsule Take 1 capsule (50 mg total) by mouth as needed for sleep 30 capsule 2   DULoxetine (CYMBALTA) 30 MG capsule Take 30 mg by mouth daily.     DULoxetine (CYMBALTA) 60 MG capsule Take 1 capsule(s) by mouth at bedtime for anxiety/depression 30 capsule 2   empagliflozin (JARDIANCE) 25 MG TABS tablet Take 1 tablet (25 mg total) by mouth daily before breakfast. 90 tablet 3   gentamicin cream (GARAMYCIN) 0.1 % Apply to wound left lower extremity once daily until healed. (Patient not taking: Reported on 01/06/2023) 30 g 1   glucose blood (ACCU-CHEK GUIDE) test strip 1 each by Other route 3 (three) times daily. Use as instructed 300 each 3   insulin degludec (TRESIBA FLEXTOUCH) 100 UNIT/ML FlexTouch Pen Inject 70 Units into the skin daily. 60 mL 6   Insulin Pen Needle (B-D UF III MINI PEN NEEDLES) 31G X 5 MM MISC Use as directed 3 times daily. 300 each 3   mupirocin ointment (BACTROBAN) 2 % Apply  to right great toe once daily. (Patient not taking: Reported on 09/24/2022) 30 g 1   OneTouch Delica Lancets 33G MISC 1 Device by Does not apply route 3 (three) times daily. 300 each 3   sildenafil (VIAGRA) 50 MG tablet Take 1 tablet (50 mg total) by mouth daily as needed for erectile dysfunction. At least 24 hours between doses (Patient not taking: Reported on 01/06/2023) 10 tablet 1   traMADol (ULTRAM) 50 MG tablet Take 1 tablet (50 mg total) by mouth every 12 (twelve) hours as needed. 60 tablet 2   warfarin (COUMADIN) 2.5 MG tablet TAKE 1  TABLET BY MOUTH DAILY AS DIRECTED BY THE COUMADIN CLINIC. 90 tablet 1   No current facility-administered medications on file prior to visit.    Allergies  Allergen Reactions   Actos [Pioglitazone] Other (See Comments)    Stomach cramps   Gabapentin Other (See Comments)    Crying spells   Lyrica [Pregabalin] Other (See Comments)    Makes the patient somnolent   Social History   Occupational History   Not on file  Tobacco Use   Smoking status: Every Day    Packs/day: 1    Types: Cigarettes   Smokeless tobacco: Never  Vaping Use   Vaping Use: Never used  Substance and Sexual Activity   Alcohol use: Yes    Comment: Rarely   Drug use: No   Sexual activity: Yes   Family History  Problem Relation Age of Onset   Rheum arthritis Mother    Diabetes Mother    Heart failure Mother    Stroke Mother    Glaucoma Maternal Grandmother    Lung cancer Paternal Grandmother        smoker   Immunization History  Administered Date(s) Administered   Influenza,inj,Quad PF,6+ Mos 06/22/2019   Pneumococcal Polysaccharide-23 08/12/2018     Review of Systems: Negative except as noted in the HPI.   Objective: Vitals:   01/13/23 1607  BP: 115/73    Bradley [name] is a pleasant 42 y.o. male in NAD. AAO X 3.  Vascular Examination: CFT <3 seconds b/l LE. Palpable DP pulse(s) b/l LE. Palpable PT pulse(s) b/l LE. No pain with calf compression b/l. Lower extremity skin temperature gradient within normal limits. Trace edema noted left lower extremity. Varicosities present left lower extremity. Evidence of chronic venous insufficiency left lower extremity. No ischemia or gangrene noted b/l LE. No cyanosis or clubbing noted b/l LE.  Dermatological Examination: No open wounds b/l LE. No interdigital macerations noted b/l LE. Toenails 1-5 b/l elongated, discolored, dystrophic, thickened, crumbly with subungual debris and tenderness to dorsal palpation. Incurvated nailplate bilateral great toes.   Nail border hypertrophy absent. There is tenderness to palpation. Sign(s) of infection: no clinical signs of infection noted on examination today.. Hyperkeratotic lesion(s) submet head 5 right foot.  No erythema, no edema, no drainage, no fluctuance.  Neurological Examination: Pt has subjective symptoms of neuropathy. Protective sensation intact 5/5 intact bilaterally with 10g monofilament b/l. Vibratory sensation intact b/l.  Musculoskeletal Examination: Normal muscle strength 5/5 to all lower extremity muscle groups bilaterally. No pain, crepitus or joint limitation noted with ROM b/l LE. No gross bony pedal deformities b/l. Patient ambulates independently without assistive aids.  Footwear Assessment: Does the patient wear appropriate shoes? Yes. Does the patient need inserts/orthotics? No.  Lab Results  Component Value Date   HGBA1C 9.4 (A) 08/04/2022   ADA Risk Categorization: Low Risk :  Patient has all of the  following: Intact protective sensation No prior foot ulcer  No severe deformity Pedal pulses present  Assessment: No diagnosis found.   Plan: -Patient was evaluated and treated. All patient's and/or POA's questions/concerns answered on today's visit. -Diabetic foot examination performed today. -Continue diabetic foot care principles: inspect feet daily, monitor glucose as recommended by PCP and/or Endocrinologist, and follow prescribed diet per PCP, Endocrinologist and/or dietician. -Continue supportive shoe gear daily. -Toenails 1-5 bilaterally were debrided in length and girth with sterile nail nippers and dremel. Pinpoint bleeding of left great toe addressed with Lumicain Hemostatic Solution, cleansed with alcohol. triple antibiotic ointment applied. No further treatment required by patient/caregiver. -As a courtesy, callus(es) submet head 5 right foot gently filed without complication or incident. Total number pared=1. -Patient/POA to call should there be  question/concern in the interim. Return in about 3 months (around 04/14/2023).  Freddie Breech, DPM

## 2023-01-23 ENCOUNTER — Telehealth: Payer: Medicare Other | Admitting: Emergency Medicine

## 2023-01-23 DIAGNOSIS — K0889 Other specified disorders of teeth and supporting structures: Secondary | ICD-10-CM

## 2023-01-23 MED ORDER — AMOXICILLIN 500 MG PO CAPS: 500.0000 mg | ORAL_CAPSULE | Freq: Three times a day (TID) | ORAL | 0 refills | Status: AC

## 2023-01-23 NOTE — Progress Notes (Signed)
Virtual Visit Consent   Bradley [name], you are scheduled for a virtual visit with a [facility] provider today. Just as with appointments in the office, your consent must be obtained to participate. Your consent will be active for this visit and any virtual visit you may have with one of our providers in the next 365 days. If you have a MyChart account, a copy of this consent can be sent to you electronically.  As this is a virtual visit, video technology does not allow for your provider to perform a traditional examination. This may limit your provider's ability to fully assess your condition. If your provider identifies any concerns that need to be evaluated in person or the need to arrange testing (such as labs, EKG, etc.), we will make arrangements to do so. Although advances in technology are sophisticated, we cannot ensure that it will always work on either your end or our end. If the connection with a video visit is poor, the visit may have to be switched to a telephone visit. With either a video or telephone visit, we are not always able to ensure that we have a secure connection.  By engaging in this virtual visit, you consent to the provision of healthcare and authorize for your insurance to be billed (if applicable) for the services provided during this visit. Depending on your insurance coverage, you may receive a charge related to this service.  I need to obtain your verbal consent now. Are you willing to proceed with your visit today? Bradley [name] has provided verbal consent on 01/23/2023 for a virtual visit (video or telephone). Roxy Horseman, PA-C  Date: 01/23/2023 10:16 AM  Virtual Visit via Video Note   I, Roxy Horseman, connected with  Bradley [name]  ([id number], [birth year]) on 01/23/23 at 10:15 AM EDT by a video-enabled telemedicine application and verified that I am speaking with the correct person using two identifiers.  Location: Patient: Virtual Visit Location  Patient: Home Provider: Virtual Visit Location Provider: Home Office   I discussed the limitations of evaluation and management by telemedicine and the availability of in person appointments. The patient expressed understanding and agreed to proceed.    History of Present Illness: Bradley [name] is a 42 y.o. who identifies as a male who was assigned male at birth, and is being seen today for abscessed tooth.  States that it has been there for about 2 days.  Denies any treatments so far.  States that he can't take NSAIDs because he is on Coumadin.  States that he is "in the market" for a Education officer, community.  States that he has had similar symptoms before in the past that have resolved with antibiotic treatment.  HPI: HPI  Problems:  Patient Active Problem List   Diagnosis Date Noted   Hyperglycemia 03/19/2021   Polycythemia 12/17/2020   Hypertriglyceridemia 10/18/2020   Pain due to onychomycosis of toenails of both feet 09/28/2019   Diabetes mellitus without complication (HCC) 09/28/2019   Venous stasis dermatitis of left lower extremity 09/28/2019   Pulmonary emboli (HCC) 11/27/2018   Splenomegaly 11/27/2018   Focal nodular hyperplasia of liver 11/27/2018   Diabetic neuropathy (HCC) 05/12/2018   Chronic pain syndrome 05/20/2017   Bipolar disorder, current episode mixed, moderate (HCC) 03/04/2017   Varicose veins of both lower extremities with pain 02/18/2017   Chronic deep vein thrombosis (DVT) of proximal vein of left lower extremity (HCC) 11/12/2016   Smoking addiction 06/19/2016   HTN (hypertension) 07/27/2013  Morbid obesity (HCC) 07/27/2013   Bipolar 1 disorder, mixed, moderate (HCC) 07/27/2013   DM2 (diabetes mellitus, type 2) (HCC) 04/26/2013    Allergies:  Allergies  Allergen Reactions   Actos [Pioglitazone] Other (See Comments)    Stomach cramps   Gabapentin Other (See Comments)    Crying spells   Lyrica [Pregabalin] Other (See Comments)    Makes the patient somnolent    Medications:  Current Outpatient Medications:    amoxicillin (AMOXIL) 500 MG capsule, Take 1 capsule (500 mg total) by mouth 3 (three) times daily for 10 days., Disp: 30 capsule, Rfl: 0   albuterol (VENTOLIN HFA) 108 (90 Base) MCG/ACT inhaler, INHALE 1-2 PUFFS INTO THE LUNGS EVERY 6 (SIX) HOURS AS NEEDED FOR WHEEZING OR SHORTNESS OF BREATH., Disp: 18 g, Rfl: 2   ARIPiprazole (ABILIFY) 5 MG tablet, Take 1 tablet (5 mg total) by mouth at bedtime., Disp: 30 tablet, Rfl: 2   atorvastatin (LIPITOR) 20 MG tablet, Take 1 tablet (20 mg total) by mouth daily., Disp: 90 tablet, Rfl: 2   Blood Glucose Calibration (ACCU-CHEK ACTIVE GLUCOSE CONT VI), by In Vitro route., Disp: , Rfl:    doxepin (SINEQUAN) 50 MG capsule, Take 1 capsule (50 mg total) by mouth as needed for sleep, Disp: 30 capsule, Rfl: 2   DULoxetine (CYMBALTA) 30 MG capsule, Take 30 mg by mouth daily., Disp: , Rfl:    DULoxetine (CYMBALTA) 60 MG capsule, Take 1 capsule(s) by mouth at bedtime for anxiety/depression, Disp: 30 capsule, Rfl: 2   empagliflozin (JARDIANCE) 25 MG TABS tablet, Take 1 tablet (25 mg total) by mouth daily before breakfast., Disp: 90 tablet, Rfl: 3   gentamicin cream (GARAMYCIN) 0.1 %, Apply to wound left lower extremity once daily until healed. (Patient not taking: Reported on 01/06/2023), Disp: 30 g, Rfl: 1   glucose blood (ACCU-CHEK GUIDE) test strip, 1 each by Other route 3 (three) times daily. Use as instructed, Disp: 300 each, Rfl: 3   insulin degludec (TRESIBA FLEXTOUCH) 100 UNIT/ML FlexTouch Pen, Inject 70 Units into the skin daily., Disp: 60 mL, Rfl: 6   Insulin Pen Needle (B-D UF III MINI PEN NEEDLES) 31G X 5 MM MISC, Use as directed 3 times daily., Disp: 300 each, Rfl: 3   mupirocin ointment (BACTROBAN) 2 %, Apply to right great toe once daily. (Patient not taking: Reported on 09/24/2022), Disp: 30 g, Rfl: 1   OneTouch Delica Lancets 33G MISC, 1 Device by Does not apply route 3 (three) times daily., Disp: 300  each, Rfl: 3   sildenafil (VIAGRA) 50 MG tablet, Take 1 tablet (50 mg total) by mouth daily as needed for erectile dysfunction. At least 24 hours between doses (Patient not taking: Reported on 01/06/2023), Disp: 10 tablet, Rfl: 1   traMADol (ULTRAM) 50 MG tablet, Take 1 tablet (50 mg total) by mouth every 12 (twelve) hours as needed., Disp: 60 tablet, Rfl: 2   warfarin (COUMADIN) 2.5 MG tablet, TAKE 1 TABLET BY MOUTH DAILY AS DIRECTED BY THE COUMADIN CLINIC., Disp: 90 tablet, Rfl: 1  Observations/Objective: Patient is well-developed, well-nourished in no acute distress.  Resting comfortably at home.  Head is normocephalic, atraumatic.  No labored breathing.  Speech is clear and coherent with logical content.  Patient is alert and oriented at baseline.    Assessment and Plan: 1. Pain, dental   Meds ordered this encounter  Medications   amoxicillin (AMOXIL) 500 MG capsule    Sig: Take 1 capsule (500 mg total) by mouth  3 (three) times daily for 10 days.    Dispense:  30 capsule    Refill:  0    Order Specific Question:   Supervising Provider    Answer:   Merrilee Jansky [id number]   Patient with probable dental abscess.  No significant facial swelling.  Normal speech. No notable trismus.  Appears stable for outpatient antibiotic trial. Discussed follow-up with dentistry.    Follow Up Instructions: I discussed the assessment and treatment plan with the patient. The patient was provided an opportunity to ask questions and all were answered. The patient agreed with the plan and demonstrated an understanding of the instructions.  A copy of instructions were sent to the patient via MyChart unless otherwise noted below.     The patient was advised to call back or seek an in-person evaluation if the symptoms worsen or if the condition fails to improve as anticipated.  Time:  I spent 12 minutes with the patient via telehealth technology discussing the above problems/concerns.    Roxy Horseman, PA-C

## 2023-01-23 NOTE — Patient Instructions (Addendum)
Bradley [name], thank you for joining Roxy Horseman, PA-C for today's virtual visit.  While this provider is not your primary care provider (PCP), if your PCP is located in our provider database this encounter information will be shared with them immediately following your visit.   A [employer] MyChart account gives you access to today's visit and all your visits, tests, and labs performed at [facility] " click here if you don't have a  MyChart account or go to [email or web address]  Consent: (Patient) Bradley [name] provided verbal consent for this virtual visit at the beginning of the encounter.  Current Medications:  Current Outpatient Medications:    amoxicillin (AMOXIL) 500 MG capsule, Take 1 capsule (500 mg total) by mouth 3 (three) times daily for 10 days., Disp: 30 capsule, Rfl: 0   albuterol (VENTOLIN HFA) 108 (90 Base) MCG/ACT inhaler, INHALE 1-2 PUFFS INTO THE LUNGS EVERY 6 (SIX) HOURS AS NEEDED FOR WHEEZING OR SHORTNESS OF BREATH., Disp: 18 g, Rfl: 2   ARIPiprazole (ABILIFY) 5 MG tablet, Take 1 tablet (5 mg total) by mouth at bedtime., Disp: 30 tablet, Rfl: 2   atorvastatin (LIPITOR) 20 MG tablet, Take 1 tablet (20 mg total) by mouth daily., Disp: 90 tablet, Rfl: 2   Blood Glucose Calibration (ACCU-CHEK ACTIVE GLUCOSE CONT VI), by In Vitro route., Disp: , Rfl:    doxepin (SINEQUAN) 50 MG capsule, Take 1 capsule (50 mg total) by mouth as needed for sleep, Disp: 30 capsule, Rfl: 2   DULoxetine (CYMBALTA) 30 MG capsule, Take 30 mg by mouth daily., Disp: , Rfl:    DULoxetine (CYMBALTA) 60 MG capsule, Take 1 capsule(s) by mouth at bedtime for anxiety/depression, Disp: 30 capsule, Rfl: 2   empagliflozin (JARDIANCE) 25 MG TABS tablet, Take 1 tablet (25 mg total) by mouth daily before breakfast., Disp: 90 tablet, Rfl: 3   gentamicin cream (GARAMYCIN) 0.1 %, Apply to wound left lower extremity once daily until healed. (Patient not taking: Reported on  01/06/2023), Disp: 30 g, Rfl: 1   glucose blood (ACCU-CHEK GUIDE) test strip, 1 each by Other route 3 (three) times daily. Use as instructed, Disp: 300 each, Rfl: 3   insulin degludec (TRESIBA FLEXTOUCH) 100 UNIT/ML FlexTouch Pen, Inject 70 Units into the skin daily., Disp: 60 mL, Rfl: 6   Insulin Pen Needle (B-D UF III MINI PEN NEEDLES) 31G X 5 MM MISC, Use as directed 3 times daily., Disp: 300 each, Rfl: 3   mupirocin ointment (BACTROBAN) 2 %, Apply to right great toe once daily. (Patient not taking: Reported on 09/24/2022), Disp: 30 g, Rfl: 1   OneTouch Delica Lancets 33G MISC, 1 Device by Does not apply route 3 (three) times daily., Disp: 300 each, Rfl: 3   sildenafil (VIAGRA) 50 MG tablet, Take 1 tablet (50 mg total) by mouth daily as needed for erectile dysfunction. At least 24 hours between doses (Patient not taking: Reported on 01/06/2023), Disp: 10 tablet, Rfl: 1   traMADol (ULTRAM) 50 MG tablet, Take 1 tablet (50 mg total) by mouth every 12 (twelve) hours as needed., Disp: 60 tablet, Rfl: 2   warfarin (COUMADIN) 2.5 MG tablet, TAKE 1 TABLET BY MOUTH DAILY AS DIRECTED BY THE COUMADIN CLINIC., Disp: 90 tablet, Rfl: 1   Medications ordered in this encounter:  Meds ordered this encounter  Medications   amoxicillin (AMOXIL) 500 MG capsule    Sig: Take 1 capsule (500 mg total) by mouth 3 (three) times daily for 10 days.  Dispense:  30 capsule    Refill:  0    Order Specific Question:   Supervising Provider    Answer:   Merrilee Jansky [id number]     *If you need refills on other medications prior to your next appointment, please contact your pharmacy*  Follow-Up: Call back or seek an in-person evaluation if the symptoms worsen or if the condition fails to improve as anticipated.  [facility] [phone]  Other Instructions  Please contact Dr. Mia Creek. [address] [place], [place] [phone]  If you have been instructed to have an in-person  evaluation today at a local Urgent Care facility, please use the link below. It will take you to a list of all of our available [facility], including address, phone number and hours of operation. Please do not delay care.  [facility]  If you or a family member do not have a primary care provider, use the link below to schedule a visit and establish care. When you choose a [facility] primary care physician or advanced practice provider, you gain a long-term partner in health. Find a Primary Care Provider  Learn more about [facility]'s in-office and virtual care options: [facility] - Get Care Now

## 2023-01-26 ENCOUNTER — Telehealth: Payer: Self-pay | Admitting: *Deleted

## 2023-01-26 NOTE — Progress Notes (Unsigned)
  [employer] Care Coordination Note  01/26/2023 Name: [name] MRN: [id number] DOB: 1981-05-29  [name] is a 42 y.o. year old male who is a primary care patient of Hoy Register, MD and is actively engaged with the care management team. I reached out to [name] by phone today to assist with scheduling a follow up visit with the RN Case Manager  Follow up plan: Unsuccessful telephone outreach attempt made. A HIPAA compliant phone message was left for the patient providing contact information and requesting a return call.   [facility]  Care Coordination Care Guide  Direct Dial: [phone]

## 2023-01-27 ENCOUNTER — Other Ambulatory Visit: Payer: Self-pay

## 2023-01-28 NOTE — Progress Notes (Unsigned)
  Care Coordination  Outreach Note  01/28/2023 Name: Bradley [name] MRN: [id number] DOB: Jun 29, 1981   Care Coordination Outreach Attempts: An unsuccessful telephone outreach was attempted.  Follow Up Plan:  Additional outreach attempts will be made.  Encounter Outcome:  No Answer  Christie Nottingham  Care Coordination Care Guide  Direct Dial: [phone]

## 2023-01-29 NOTE — Progress Notes (Signed)
  [employer] Care Coordination Note  01/29/2023 Name: CHIDERA THIVIERGE MRN: [id number] DOB: 09-05-1981  [name] is a 42 y.o. year old male who is a primary care patient of Hoy Register, MD and is actively engaged with the care management team. I reached out to [name] by phone today to assist with scheduling a follow up visit with the RN Case Manager  Follow up plan: Telephone appointment with care management team member scheduled for:02/17/23  [facility] Coordination Care Guide  Direct Dial: [phone]

## 2023-02-02 ENCOUNTER — Encounter: Payer: Self-pay | Admitting: Family Medicine

## 2023-02-02 ENCOUNTER — Other Ambulatory Visit: Payer: Self-pay

## 2023-02-02 ENCOUNTER — Ambulatory Visit: Payer: Medicare HMO | Attending: Family Medicine | Admitting: Family Medicine

## 2023-02-02 VITALS — BP 110/73 | HR 101 | Ht 72.0 in | Wt 262.4 lb

## 2023-02-02 DIAGNOSIS — Z7901 Long term (current) use of anticoagulants: Secondary | ICD-10-CM | POA: Insufficient documentation

## 2023-02-02 DIAGNOSIS — I82502 Chronic embolism and thrombosis of unspecified deep veins of left lower extremity: Secondary | ICD-10-CM | POA: Diagnosis not present

## 2023-02-02 DIAGNOSIS — E1149 Type 2 diabetes mellitus with other diabetic neurological complication: Secondary | ICD-10-CM | POA: Diagnosis not present

## 2023-02-02 DIAGNOSIS — E1165 Type 2 diabetes mellitus with hyperglycemia: Secondary | ICD-10-CM | POA: Diagnosis not present

## 2023-02-02 DIAGNOSIS — Z7984 Long term (current) use of oral hypoglycemic drugs: Secondary | ICD-10-CM | POA: Insufficient documentation

## 2023-02-02 DIAGNOSIS — G8929 Other chronic pain: Secondary | ICD-10-CM | POA: Diagnosis not present

## 2023-02-02 DIAGNOSIS — E1140 Type 2 diabetes mellitus with diabetic neuropathy, unspecified: Secondary | ICD-10-CM | POA: Diagnosis not present

## 2023-02-02 DIAGNOSIS — Z794 Long term (current) use of insulin: Secondary | ICD-10-CM | POA: Insufficient documentation

## 2023-02-02 DIAGNOSIS — F3162 Bipolar disorder, current episode mixed, moderate: Secondary | ICD-10-CM | POA: Diagnosis not present

## 2023-02-02 DIAGNOSIS — Z6835 Body mass index (BMI) 35.0-35.9, adult: Secondary | ICD-10-CM | POA: Diagnosis not present

## 2023-02-02 DIAGNOSIS — Z91199 Patient's noncompliance with other medical treatment and regimen due to unspecified reason: Secondary | ICD-10-CM | POA: Diagnosis not present

## 2023-02-02 DIAGNOSIS — Z79899 Other long term (current) drug therapy: Secondary | ICD-10-CM | POA: Insufficient documentation

## 2023-02-02 DIAGNOSIS — I825Y2 Chronic embolism and thrombosis of unspecified deep veins of left proximal lower extremity: Secondary | ICD-10-CM

## 2023-02-02 NOTE — Progress Notes (Signed)
Subjective:  Patient ID: Bradley [name], male    DOB: 10-02-1980  Age: 42 y.o. MRN: [id number]  CC: Hypertension   HPI Bradley [name] is a 42 y.o. year old male with a history of morbid obesity, type 2 diabetes mellitus (A1c 9.4 managed by endocrine, non compliance,diabetic neuropathy (unable to tolerate gabapentin, Lyrica), Bipolar disorder (he goes to [facility]) , previous DVT in 2017, PE (diagnosed in 10/2018 - on chronic anticoagulation with Coumadin).   Interval History:  Last visit with his endocrinologist was in 08/2022 and next visit comes up on 02/21/23.  Also seeing the nutritionist and is working on his diet. He remains adherent with his diabetic regimen.  He does not exercise much but states he now has Silver sneakers with his insurance and should be able to attend the gym.  He sees [facility] for management of his bipolar disorder.  He is adherent with Tramadol for his Neuropathy. Drinks alcohol about twice a year. Continues to smoke but is planning to switch to vaping.  His last visit with the clinical pharmacist was last month and his INR was 2.4.  He informs me that his insurance will no longer cover his clinical pharmacy visit. Past Medical History:  Diagnosis Date   ADHD    ADHD (attention deficit hyperactivity disorder)    Bipolar 1 disorder (HCC)    Bipolar disorder (HCC)    Diabetes mellitus without complication (HCC)    Hyperlipidemia    Morbid obesity (HCC)    Obesity    Panic attack    Varicose veins of both lower extremities with pain     Past Surgical History:  Procedure Laterality Date   surgery on meatus as a child      Family History  Problem Relation Age of Onset   Rheum arthritis Mother    Diabetes Mother    Heart failure Mother    Stroke Mother    Glaucoma Maternal Grandmother    Lung cancer Paternal Grandmother        smoker    Social History   Socioeconomic History   Marital  status: Single    Spouse name: Not on file   Number of children: Not on file   Years of education: Not on file   Highest education level: Not on file  Occupational History   Not on file  Tobacco Use   Smoking status: Every Day    Packs/day: 1    Types: Cigarettes   Smokeless tobacco: Never  Vaping Use   Vaping Use: Never used  Substance and Sexual Activity   Alcohol use: Yes    Comment: Rarely   Drug use: No   Sexual activity: Yes  Other Topics Concern   Not on file  Social History Narrative   ** Merged History Encounter **       Social Determinants of Health   Financial Resource Strain: Not on file  Food Insecurity: Not on file  Transportation Needs: Not on file  Physical Activity: Not on file  Stress: Not on file  Social Connections: Not on file    Allergies  Allergen Reactions   Actos [Pioglitazone] Other (See Comments)    Stomach cramps   Gabapentin Other (See Comments)    Crying spells   Lyrica [Pregabalin] Other (See Comments)    Makes the patient somnolent    Outpatient Medications Prior to Visit  Medication Sig Dispense Refill   amoxicillin (AMOXIL) 500 MG capsule  Take 1 capsule (500 mg total) by mouth 3 (three) times daily for 10 days. 30 capsule 0   ARIPiprazole (ABILIFY) 5 MG tablet Take 1 tablet (5 mg total) by mouth at bedtime. 30 tablet 2   atorvastatin (LIPITOR) 20 MG tablet Take 1 tablet (20 mg total) by mouth daily. 90 tablet 2   Blood Glucose Calibration (ACCU-CHEK ACTIVE GLUCOSE CONT VI) by In Vitro route.     doxepin (SINEQUAN) 50 MG capsule Take 1 capsule (50 mg total) by mouth as needed for sleep 30 capsule 2   DULoxetine (CYMBALTA) 30 MG capsule Take 30 mg by mouth daily.     DULoxetine (CYMBALTA) 60 MG capsule Take 1 capsule(s) by mouth at bedtime for anxiety/depression 30 capsule 2   empagliflozin (JARDIANCE) 25 MG TABS tablet Take 1 tablet (25 mg total) by mouth daily before breakfast. 90 tablet 3   gentamicin cream (GARAMYCIN) 0.1 %  Apply to wound left lower extremity once daily until healed. 30 g 1   glucose blood (ACCU-CHEK GUIDE) test strip 1 each by Other route 3 (three) times daily. Use as instructed 300 each 3   insulin degludec (TRESIBA FLEXTOUCH) 100 UNIT/ML FlexTouch Pen Inject 70 Units into the skin daily. 60 mL 6   Insulin Pen Needle (B-D UF III MINI PEN NEEDLES) 31G X 5 MM MISC Use as directed 3 times daily. 300 each 3   OneTouch Delica Lancets 33G MISC 1 Device by Does not apply route 3 (three) times daily. 300 each 3   sildenafil (VIAGRA) 50 MG tablet Take 1 tablet (50 mg total) by mouth daily as needed for erectile dysfunction. At least 24 hours between doses 10 tablet 1   traMADol (ULTRAM) 50 MG tablet Take 1 tablet (50 mg total) by mouth every 12 (twelve) hours as needed. 60 tablet 2   warfarin (COUMADIN) 2.5 MG tablet TAKE 1 TABLET BY MOUTH DAILY AS DIRECTED BY THE COUMADIN CLINIC. 90 tablet 1   albuterol (VENTOLIN HFA) 108 (90 Base) MCG/ACT inhaler INHALE 1-2 PUFFS INTO THE LUNGS EVERY 6 (SIX) HOURS AS NEEDED FOR WHEEZING OR SHORTNESS OF BREATH. 18 g 2   mupirocin ointment (BACTROBAN) 2 % Apply to right great toe once daily. (Patient not taking: Reported on 02/02/2023) 30 g 1   No facility-administered medications prior to visit.     ROS Review of Systems  Constitutional:  Negative for activity change and appetite change.  HENT:  Negative for sinus pressure and sore throat.   Respiratory:  Negative for chest tightness, shortness of breath and wheezing.   Cardiovascular:  Negative for chest pain and palpitations.  Gastrointestinal:  Negative for abdominal distention, abdominal pain and constipation.  Genitourinary: Negative.   Musculoskeletal: Negative.   Psychiatric/Behavioral:  Negative for behavioral problems and dysphoric mood.     Objective:  BP 110/73   Pulse (!) 101   Ht 6' (1.829 m)   Wt 262 lb 6.4 oz (119 kg)   SpO2 98%   BMI 35.59 kg/m      02/02/2023    2:10 PM 01/13/2023    4:07 PM  12/08/2022    2:50 PM  BP/Weight  Systolic BP 110 115   Diastolic BP 73 73   Wt. (Lbs) 262.4  262  BMI 35.59 kg/m2  35.53 kg/m2    Wt Readings from Last 3 Encounters:  02/02/23 262 lb 6.4 oz (119 kg)  12/08/22 262 lb (118.8 kg)  09/24/22 263 lb 4.8 oz (119.4 kg)  Physical Exam Constitutional:      Appearance: He is well-developed.  Cardiovascular:     Rate and Rhythm: Normal rate.     Heart sounds: Normal heart sounds. No murmur heard. Pulmonary:     Effort: Pulmonary effort is normal.     Breath sounds: Normal breath sounds. No wheezing or rales.  Chest:     Chest wall: No tenderness.  Abdominal:     General: Bowel sounds are normal. There is no distension.     Palpations: Abdomen is soft. There is no mass.     Tenderness: There is no abdominal tenderness.  Musculoskeletal:        General: Normal range of motion.     Right lower leg: No edema.     Left lower leg: No edema.  Neurological:     Mental Status: He is alert and oriented to person, place, and time.  Psychiatric:        Mood and Affect: Mood normal.        Latest Ref Rng & Units 09/24/2022   12:49 PM 03/19/2022    2:21 PM 09/17/2021    2:40 PM  CMP  Glucose 70 - 99 mg/dL 098  119  147   BUN 6 - 20 mg/dL 16  14  14    Creatinine 0.61 - 1.24 mg/dL 8.29  5.62  1.30   Sodium 135 - 145 mmol/L 138  137  136   Potassium 3.5 - 5.1 mmol/L 4.5  4.6  4.3   Chloride 98 - 111 mmol/L 103  101  99   CO2 22 - 32 mmol/L 29  25  29    Calcium 8.9 - 10.3 mg/dL 9.8  8.9  9.3   Total Protein 6.5 - 8.1 g/dL 7.2  7.7  7.0   Total Bilirubin 0.3 - 1.2 mg/dL 0.8  1.2  1.1   Alkaline Phos 38 - 126 U/L 62  70  78   AST 15 - 41 U/L 20  20  16    ALT 0 - 44 U/L 24  29  22      Lipid Panel     Component Value Date/Time   CHOL 114 04/22/2022 1449   TRIG 140 04/22/2022 1449   HDL 41 04/22/2022 1449   CHOLHDL 3.9 12/06/2020 1456   CHOLHDL 4.0 11/12/2016 1613   VLDL 28 11/12/2016 1613   LDLCALC 49 04/22/2022 1449     CBC    Component Value Date/Time   WBC 9.2 09/24/2022 1249   WBC 9.5 04/19/2020 2357   RBC 6.59 (H) 09/24/2022 1249   HGB 19.0 (H) 09/24/2022 1249   HGB 18.9 (H) 12/06/2020 1456   HCT 55.0 (H) 09/24/2022 1249   HCT 57.0 (H) 12/06/2020 1456   PLT 98 (L) 09/24/2022 1249   PLT 120 (L) 12/06/2020 1456   MCV 83.5 09/24/2022 1249   MCV 84 12/06/2020 1456   MCH 28.8 09/24/2022 1249   MCHC 34.5 09/24/2022 1249   RDW 13.9 09/24/2022 1249   RDW 13.1 12/06/2020 1456   LYMPHSABS 2.2 09/24/2022 1249   LYMPHSABS 2.0 12/06/2020 1456   MONOABS 0.6 09/24/2022 1249   EOSABS 0.2 09/24/2022 1249   EOSABS 0.1 12/06/2020 1456   BASOSABS 0.1 09/24/2022 1249   BASOSABS 0.1 12/06/2020 1456    Lab Results  Component Value Date   HGBA1C 9.4 (A) 08/04/2022   Lab Results  Component Value Date   INR 2.4 01/15/2023   INR 2.9 12/16/2022   INR 2.2  11/11/2022     Assessment & Plan:  1. Type 2 diabetes mellitus with hyperglycemia, with long-term current use of insulin (HCC) Uncontrolled with A1c of 9.4 He continues to work on his diet and will start an exercise regimen soon Management per endocrine - Microalbumin / creatinine urine ratio; Future - LP+Non-HDL Cholesterol; Future - CMP14+EGFR; Future  2. Chronic deep vein thrombosis (DVT) of proximal vein of left lower extremity (HCC) Currently on lifelong anticoagulation Last INR was therapeutic at 2.4 Advised him that if his visits do not cover his clinical pharmacy visits I can see him on a once monthly basis to check his INR  3. Bipolar 1 disorder, mixed, moderate (HCC) Controlled Continue to follow-up with neuropsychiatry associates  4. Other diabetic neurological complication associated with type 2 diabetes mellitus (HCC) Controlled Unable to tolerate gabapentin, Lyrica Uncontrolled on Cymbalta He remains on chronic tramadol.  PDMP reviewed, no aberrant behavior  5. Other chronic pain Controlled - Drug Screen 12+Alcohol+CRT,  Ur; Future    No orders of the defined types were placed in this encounter.   Follow-up: Return in about 1 month (around 03/05/2023) for INR check, Chronic medical conditions in 6 months with PCP.       Hoy Register, MD, FAAFP. [facility] [place], [place] [phone]   02/02/2023, 3:09 PM

## 2023-02-02 NOTE — Patient Instructions (Signed)

## 2023-02-03 ENCOUNTER — Ambulatory Visit: Payer: Medicare Other | Attending: Family Medicine

## 2023-02-03 DIAGNOSIS — Z794 Long term (current) use of insulin: Secondary | ICD-10-CM

## 2023-02-03 DIAGNOSIS — G8929 Other chronic pain: Secondary | ICD-10-CM

## 2023-02-03 DIAGNOSIS — E1165 Type 2 diabetes mellitus with hyperglycemia: Secondary | ICD-10-CM | POA: Diagnosis not present

## 2023-02-04 ENCOUNTER — Other Ambulatory Visit: Payer: Self-pay | Admitting: Family Medicine

## 2023-02-04 DIAGNOSIS — E1165 Type 2 diabetes mellitus with hyperglycemia: Secondary | ICD-10-CM | POA: Diagnosis not present

## 2023-02-04 DIAGNOSIS — R600 Localized edema: Secondary | ICD-10-CM | POA: Diagnosis not present

## 2023-02-04 DIAGNOSIS — Z8249 Family history of ischemic heart disease and other diseases of the circulatory system: Secondary | ICD-10-CM | POA: Diagnosis not present

## 2023-02-04 DIAGNOSIS — Z888 Allergy status to other drugs, medicaments and biological substances status: Secondary | ICD-10-CM | POA: Diagnosis not present

## 2023-02-04 DIAGNOSIS — N529 Male erectile dysfunction, unspecified: Secondary | ICD-10-CM | POA: Diagnosis not present

## 2023-02-04 DIAGNOSIS — I872 Venous insufficiency (chronic) (peripheral): Secondary | ICD-10-CM | POA: Diagnosis not present

## 2023-02-04 DIAGNOSIS — I2782 Chronic pulmonary embolism: Secondary | ICD-10-CM | POA: Diagnosis not present

## 2023-02-04 DIAGNOSIS — F319 Bipolar disorder, unspecified: Secondary | ICD-10-CM | POA: Diagnosis not present

## 2023-02-04 DIAGNOSIS — G8929 Other chronic pain: Secondary | ICD-10-CM | POA: Diagnosis not present

## 2023-02-04 DIAGNOSIS — F419 Anxiety disorder, unspecified: Secondary | ICD-10-CM | POA: Diagnosis not present

## 2023-02-04 DIAGNOSIS — E1140 Type 2 diabetes mellitus with diabetic neuropathy, unspecified: Secondary | ICD-10-CM | POA: Diagnosis not present

## 2023-02-04 DIAGNOSIS — E785 Hyperlipidemia, unspecified: Secondary | ICD-10-CM | POA: Diagnosis not present

## 2023-02-04 DIAGNOSIS — Z720 Tobacco use: Secondary | ICD-10-CM | POA: Diagnosis not present

## 2023-02-04 DIAGNOSIS — Z794 Long term (current) use of insulin: Secondary | ICD-10-CM | POA: Diagnosis not present

## 2023-02-04 LAB — MICROALBUMIN / CREATININE URINE RATIO
Creatinine, Urine: 49.4 mg/dL
Microalb/Creat Ratio: 8 mg/g creat (ref 0–29)
Microalbumin, Urine: 3.8 ug/mL

## 2023-02-05 LAB — LP+NON-HDL CHOLESTEROL
Cholesterol, Total: 130 mg/dL (ref 100–199)
HDL: 48 mg/dL (ref >39)
LDL Chol Calc (NIH): 62 mg/dL (ref 0–99)
Total Non-HDL-Chol (LDL+VLDL): 82 mg/dL (ref 0–129)
Triglycerides: 110 mg/dL (ref 0–149)
VLDL Cholesterol Cal: 20 mg/dL (ref 5–40)

## 2023-02-05 LAB — CMP14+EGFR
ALT: 35 IU/L (ref 0–44)
AST: 25 IU/L (ref 0–40)
Albumin/Globulin Ratio: 1.7 (ref 1.2–2.2)
Albumin: 4.4 g/dL (ref 4.1–5.1)
Alkaline Phosphatase: 74 IU/L (ref 44–121)
BUN/Creatinine Ratio: 14 (ref 9–20)
BUN: 14 mg/dL (ref 6–24)
Bilirubin Total: 0.7 mg/dL (ref 0.0–1.2)
CO2: 21 mmol/L (ref 20–29)
Calcium: 9.2 mg/dL (ref 8.7–10.2)
Chloride: 98 mmol/L (ref 96–106)
Creatinine, Ser: 1.03 mg/dL (ref 0.76–1.27)
Globulin, Total: 2.6 g/dL (ref 1.5–4.5)
Glucose: 218 mg/dL — ABNORMAL HIGH (ref 70–99)
Potassium: 4.4 mmol/L (ref 3.5–5.2)
Sodium: 137 mmol/L (ref 134–144)
Total Protein: 7 g/dL (ref 6.0–8.5)
eGFR: 93 mL/min/{1.73_m2} (ref >59)

## 2023-02-05 LAB — DRUG SCREEN 12+ALCOHOL+CRT, UR
Barbiturate: NEGATIVE ng/mL
OPIATE SCREEN URINE: NEGATIVE ng/mL
Phencyclidine: NEGATIVE ng/mL
Propoxyphene: NEGATIVE ng/mL

## 2023-02-09 ENCOUNTER — Telehealth: Payer: Self-pay | Admitting: Family Medicine

## 2023-02-09 NOTE — Telephone Encounter (Signed)
Contacted [name] to schedule their annual wellness visit. Appointment made for 02/11/2023.  Thank you,  [facility] Support [facility] Direct dial  [phone]

## 2023-02-09 NOTE — Telephone Encounter (Signed)
Copied from CRM [id number]. Topic: Medicare AWV >> Feb 09, 2023 11:27 AM Rushie Goltz wrote: Reason for CRM: Called patient to schedule Medicare Annual Wellness Visit (AWV). Left message for patient to call back and schedule Medicare Annual Wellness Visit (AWV).  Last date of AWV: 02/06/2022  Please schedule an AWVS appointment at any time with [facility] VISIT.  If any questions, please contact me at [phone].   Thank you,  [facility] Support [facility] Direct dial  [phone]

## 2023-02-10 LAB — CMP14+EGFR

## 2023-02-10 LAB — LP+NON-HDL CHOLESTEROL

## 2023-02-11 ENCOUNTER — Ambulatory Visit: Payer: Medicare HMO | Attending: Family Medicine

## 2023-02-11 VITALS — Ht 72.0 in | Wt 262.0 lb

## 2023-02-11 DIAGNOSIS — Z0000 Encounter for general adult medical examination without abnormal findings: Secondary | ICD-10-CM

## 2023-02-11 NOTE — Patient Instructions (Signed)
Bradley [name] , Thank you for taking time to come for your Medicare Wellness Visit. I appreciate your ongoing commitment to your health goals. Please review the following plan we discussed and let me know if I can assist you in the future.   These are the goals we discussed:  Goals      Increase physical activity        This is a list of the screening recommended for you and due dates:  Health Maintenance  Topic Date Due   COVID-19 Vaccine (1) Never done   DTaP/Tdap/Td vaccine (1 - Tdap) Never done   Hemoglobin A1C  02/02/2023   Flu Shot  04/30/2023   Eye exam for diabetics  05/22/2023   Complete foot exam   01/13/2024   Yearly kidney health urinalysis for diabetes  02/03/2024   Yearly kidney function blood test for diabetes  02/04/2024   Medicare Annual Wellness Visit  02/11/2024   Hepatitis C Screening: USPSTF Recommendation to screen - Ages 18-79 yo.  Completed   HIV Screening  Completed   HPV Vaccine  Aged Out    Advanced directives: Information on Advanced Care Planning can be found at [facility] Advance Health Care Directives Advance Health Care Directives ([email or web address]    Conditions/risks identified: Aim for 30 minutes of exercise or brisk walking, 6-8 glasses of water, and 5 servings of fruits and vegetables each day.   Next appointment: Follow up in one year for your annual wellness visit   Preventive Care 40-64 Years, Male Preventive care refers to lifestyle choices and visits with your health care provider that can promote health and wellness. What does preventive care include? A yearly physical exam. This is also called an annual well check. Dental exams once or twice a year. Routine eye exams. Ask your health care provider how often you should have your eyes checked. Personal lifestyle choices, including: Daily care of your teeth and gums. Regular physical activity. Eating a healthy diet. Avoiding tobacco and drug use. Limiting alcohol  use. Practicing safe sex. Taking low-dose aspirin every day starting at age 70. What happens during an annual well check? The services and screenings done by your health care provider during your annual well check will depend on your age, overall health, lifestyle risk factors, and family history of disease. Counseling  Your health care provider may ask you questions about your: Alcohol use. Tobacco use. Drug use. Emotional well-being. Home and relationship well-being. Sexual activity. Eating habits. Work and work Astronomer. Screening  You may have the following tests or measurements: Height, weight, and BMI. Blood pressure. Lipid and cholesterol levels. These may be checked every 5 years, or more frequently if you are over 38 years old. Skin check. Lung cancer screening. You may have this screening every year starting at age 49 if you have a 30-pack-year history of smoking and currently smoke or have quit within the past 15 years. Fecal occult blood test (FOBT) of the stool. You may have this test every year starting at age 8. Flexible sigmoidoscopy or colonoscopy. You may have a sigmoidoscopy every 5 years or a colonoscopy every 10 years starting at age 80. Prostate cancer screening. Recommendations will vary depending on your family history and other risks. Hepatitis C blood test. Hepatitis B blood test. Sexually transmitted disease (STD) testing. Diabetes screening. This is done by checking your blood sugar (glucose) after you have not eaten for a while (fasting). You may have this done every 1-3 years.  Discuss your test results, treatment options, and if necessary, the need for more tests with your health care provider. Vaccines  Your health care provider may recommend certain vaccines, such as: Influenza vaccine. This is recommended every year. Tetanus, diphtheria, and acellular pertussis (Tdap, Td) vaccine. You may need a Td booster every 10 years. Zoster vaccine. You may  need this after age 6. Pneumococcal 13-valent conjugate (PCV13) vaccine. You may need this if you have certain conditions and have not been vaccinated. Pneumococcal polysaccharide (PPSV23) vaccine. You may need one or two doses if you smoke cigarettes or if you have certain conditions. Talk to your health care provider about which screenings and vaccines you need and how often you need them. This information is not intended to replace advice given to you by your health care provider. Make sure you discuss any questions you have with your health care provider. Document Released: 10/12/2015 Document Revised: 06/04/2016 Document Reviewed: 07/17/2015 Elsevier Interactive Patient Education  2017 [employer].  Fall Prevention in the Home Falls can cause injuries. They can happen to people of all ages. There are many things you can do to make your home safe and to help prevent falls. What can I do on the outside of my home? Regularly fix the edges of walkways and driveways and fix any cracks. Remove anything that might make you trip as you walk through a door, such as a raised step or threshold. Trim any bushes or trees on the path to your home. Use bright outdoor lighting. Clear any walking paths of anything that might make someone trip, such as rocks or tools. Regularly check to see if handrails are loose or broken. Make sure that both sides of any steps have handrails. Any raised decks and porches should have guardrails on the edges. Have any leaves, snow, or ice cleared regularly. Use sand or salt on walking paths during winter. Clean up any spills in your garage right away. This includes oil or grease spills. What can I do in the bathroom? Use night lights. Install grab bars by the toilet and in the tub and shower. Do not use towel bars as grab bars. Use non-skid mats or decals in the tub or shower. If you need to sit down in the shower, use a plastic, non-slip stool. Keep the floor dry.  Clean up any water that spills on the floor as soon as it happens. Remove soap buildup in the tub or shower regularly. Attach bath mats securely with double-sided non-slip rug tape. Do not have throw rugs and other things on the floor that can make you trip. What can I do in the bedroom? Use night lights. Make sure that you have a light by your bed that is easy to reach. Do not use any sheets or blankets that are too big for your bed. They should not hang down onto the floor. Have a firm chair that has side arms. You can use this for support while you get dressed. Do not have throw rugs and other things on the floor that can make you trip. What can I do in the kitchen? Clean up any spills right away. Avoid walking on wet floors. Keep items that you use a lot in easy-to-reach places. If you need to reach something above you, use a strong step stool that has a grab bar. Keep electrical cords out of the way. Do not use floor polish or wax that makes floors slippery. If you must use wax, use non-skid  floor wax. Do not have throw rugs and other things on the floor that can make you trip. What can I do with my stairs? Do not leave any items on the stairs. Make sure that there are handrails on both sides of the stairs and use them. Fix handrails that are broken or loose. Make sure that handrails are as long as the stairways. Check any carpeting to make sure that it is firmly attached to the stairs. Fix any carpet that is loose or worn. Avoid having throw rugs at the top or bottom of the stairs. If you do have throw rugs, attach them to the floor with carpet tape. Make sure that you have a light switch at the top of the stairs and the bottom of the stairs. If you do not have them, ask someone to add them for you. What else can I do to help prevent falls? Wear shoes that: Do not have high heels. Have rubber bottoms. Are comfortable and fit you well. Are closed at the toe. Do not wear sandals. If  you use a stepladder: Make sure that it is fully opened. Do not climb a closed stepladder. Make sure that both sides of the stepladder are locked into place. Ask someone to hold it for you, if possible. Clearly mark and make sure that you can see: Any grab bars or handrails. First and last steps. Where the edge of each step is. Use tools that help you move around (mobility aids) if they are needed. These include: Canes. Walkers. Scooters. Crutches. Turn on the lights when you go into a dark area. Replace any light bulbs as soon as they burn out. Set up your furniture so you have a clear path. Avoid moving your furniture around. If any of your floors are uneven, fix them. If there are any pets around you, be aware of where they are. Review your medicines with your doctor. Some medicines can make you feel dizzy. This can increase your chance of falling. Ask your doctor what other things that you can do to help prevent falls. This information is not intended to replace advice given to you by your health care provider. Make sure you discuss any questions you have with your health care provider. Document Released: 07/12/2009 Document Revised: 02/21/2016 Document Reviewed: 10/20/2014 Elsevier Interactive Patient Education  2017 [employer].

## 2023-02-11 NOTE — Progress Notes (Signed)
Subjective:   Bradley [name] is a 42 y.o. male who presents for Medicare Annual/Subsequent preventive examination.  I connected with  Bradley [name] on 02/11/23 by a audio enabled telemedicine application and verified that I am speaking with the correct person using two identifiers.  Patient Location: Home  Provider Location: Home Office  I discussed the limitations of evaluation and management by telemedicine. The patient expressed understanding and agreed to proceed.  Review of Systems     Cardiac Risk Factors include: diabetes mellitus;hypertension;male gender;sedentary lifestyle;dyslipidemia     Objective:    Today's Vitals   02/11/23 2136  Weight: 262 lb (118.8 kg)  Height: 6' (1.829 m)   Body mass index is 35.53 kg/m.     02/11/2023    9:45 PM 04/25/2022   10:44 PM 02/06/2022    2:43 PM 12/17/2020    1:18 PM 11/23/2020   10:57 AM 04/14/2019    4:05 PM 11/27/2018    6:00 AM  Advanced Directives  Does Patient Have a Medical Advance Directive? No No No No No No No  Would patient like information on creating a medical advance directive? Yes (MAU/Ambulatory/Procedural Areas - Information given) No - Patient declined No - Patient declined No - Patient declined Yes (MAU/Ambulatory/Procedural Areas - Information given) Yes (Inpatient - patient defers creating a medical advance directive at this time - Information given);Yes (ED - Information included in AVS) No - Patient declined    Current Medications (verified) Outpatient Encounter Medications as of 02/11/2023  Medication Sig   atorvastatin (LIPITOR) 20 MG tablet Take 1 tablet (20 mg total) by mouth daily.   DULoxetine (CYMBALTA) 30 MG capsule Take 30 mg by mouth daily.   DULoxetine (CYMBALTA) 60 MG capsule Take 1 capsule(s) by mouth at bedtime for anxiety/depression   empagliflozin (JARDIANCE) 25 MG TABS tablet Take 1 tablet (25 mg total) by mouth daily before breakfast.   glucose blood (ACCU-CHEK GUIDE) test strip 1  each by Other route 3 (three) times daily. Use as instructed   insulin degludec (TRESIBA FLEXTOUCH) 100 UNIT/ML FlexTouch Pen Inject 70 Units into the skin daily.   Insulin Pen Needle (B-D UF III MINI PEN NEEDLES) 31G X 5 MM MISC Use as directed 3 times daily.   traMADol (ULTRAM) 50 MG tablet Take 1 tablet (50 mg total) by mouth every 12 (twelve) hours as needed.   warfarin (COUMADIN) 2.5 MG tablet TAKE 1 TABLET BY MOUTH DAILY AS DIRECTED BY THE COUMADIN CLINIC.   ARIPiprazole (ABILIFY) 5 MG tablet Take 1 tablet (5 mg total) by mouth at bedtime. (Patient not taking: Reported on 02/11/2023)   Blood Glucose Calibration (ACCU-CHEK ACTIVE GLUCOSE CONT VI) by In Vitro route.   doxepin (SINEQUAN) 50 MG capsule Take 1 capsule (50 mg total) by mouth as needed for sleep (Patient not taking: Reported on 02/11/2023)   gentamicin cream (GARAMYCIN) 0.1 % Apply to wound left lower extremity once daily until healed. (Patient not taking: Reported on 02/11/2023)   mupirocin ointment (BACTROBAN) 2 % Apply to right great toe once daily. (Patient not taking: Reported on 02/02/2023)   OneTouch Delica Lancets 33G MISC 1 Device by Does not apply route 3 (three) times daily.   sildenafil (VIAGRA) 50 MG tablet Take 1 tablet (50 mg total) by mouth daily as needed for erectile dysfunction. At least 24 hours between doses   [DISCONTINUED] albuterol (VENTOLIN HFA) 108 (90 Base) MCG/ACT inhaler INHALE 1-2 PUFFS INTO THE LUNGS EVERY 6 (SIX) HOURS AS NEEDED  FOR WHEEZING OR SHORTNESS OF BREATH.   No facility-administered encounter medications on file as of 02/11/2023.    Allergies (verified) Actos [pioglitazone], Gabapentin, and Lyrica [pregabalin]   History: Past Medical History:  Diagnosis Date   ADHD    ADHD (attention deficit hyperactivity disorder)    Bipolar 1 disorder (HCC)    Bipolar disorder (HCC)    Depression    Diabetes mellitus without complication (HCC)    Hyperlipidemia    Morbid obesity (HCC)    Obesity     Panic attack    Varicose veins of both lower extremities with pain    Past Surgical History:  Procedure Laterality Date   surgery on meatus as a child     Family History  Problem Relation Age of Onset   Rheum arthritis Mother    Diabetes Mother    Heart failure Mother    Stroke Mother    ADD / ADHD Mother    Anxiety disorder Mother    Arthritis Mother    Depression Mother    Drug abuse Mother    Alcohol abuse Father    Glaucoma Maternal Grandmother    COPD Maternal Grandmother    Lung cancer Paternal Grandmother        smoker   Social History   Socioeconomic History   Marital status: Single    Spouse name: Not on file   Number of children: Not on file   Years of education: Not on file   Highest education level: Not on file  Occupational History   Not on file  Tobacco Use   Smoking status: Every Day    Packs/day: 1.00    Years: 15.00    Additional pack years: 0.00    Total pack years: 15.00    Types: Cigarettes   Smokeless tobacco: Never  Vaping Use   Vaping Use: Never used  Substance and Sexual Activity   Alcohol use: Yes    Alcohol/week: 1.0 standard drink of alcohol    Types: 1 Shots of liquor per week    Comment: Only on my birthday and New Year's   Drug use: No   Sexual activity: Not Currently    Birth control/protection: Abstinence  Other Topics Concern   Not on file  Social History Narrative   ** Merged History Encounter **       Social Determinants of Health   Financial Resource Strain: Low Risk  (02/11/2023)   Overall Financial Resource Strain (CARDIA)    Difficulty of Paying Living Expenses: Not hard at all  Food Insecurity: No Food Insecurity (02/11/2023)   Hunger Vital Sign    Worried About Running Out of Food in the Last Year: Never true    Ran Out of Food in the Last Year: Never true  Transportation Needs: No Transportation Needs (02/11/2023)   PRAPARE - Administrator, Civil Service (Medical): No    Lack of Transportation  (Non-Medical): No  Physical Activity: Inactive (02/11/2023)   Exercise Vital Sign    Days of Exercise per Week: 0 days    Minutes of Exercise per Session: 0 min  Stress: No Stress Concern Present (02/11/2023)   [employer] of Occupational Health - Occupational Stress Questionnaire    Feeling of Stress : Only a little  Social Connections: Socially Isolated (02/11/2023)   Social Connection and Isolation Panel [NHANES]    Frequency of Communication with Friends and Family: Twice a week    Frequency of Social Gatherings with Friends  and Family: Once a week    Attends Religious Services: Never    Database administrator or Organizations: Patient declined    Attends Banker Meetings: Never    Marital Status: Never married    Tobacco Counseling Ready to quit: No Counseling given: Not Answered   Clinical Intake:  Pre-visit preparation completed: Yes  Pain : No/denies pain  Diabetes: No  How often do you need to have someone help you when you read instructions, pamphlets, or other written materials from your doctor or pharmacy?: 1 - Never  Diabetic?Yes   Nutrition Risk Assessment:  Has the patient had any N/V/D within the last 2 months?  No  Does the patient have any non-healing wounds?  No  Has the patient had any unintentional weight loss or weight gain?  No   Diabetes:  Is the patient diabetic?  Yes  If diabetic, was a CBG obtained today?  No  Did the patient bring in their glucometer from home?  No  How often do you monitor your CBG's? daily.   Financial Strains and Diabetes Management:  Are you having any financial strains with the device, your supplies or your medication? No .  Does the patient want to be seen by Chronic Care Management for management of their diabetes?  No  Would the patient like to be referred to a Nutritionist or for Diabetic Management?  No   Diabetic Exams:  Diabetic Eye Exam: Completed records requested Diabetic Foot Exam:  Completed 01/13/23   Interpreter Needed?: No  Information entered by :: Kandis Fantasia LPN   Activities of Daily Living    02/11/2023    9:45 PM 02/11/2023    3:03 PM  In your present state of health, do you have any difficulty performing the following activities:  Hearing? 0 0  Vision? 0 0  Difficulty concentrating or making decisions? 0 0  Walking or climbing stairs? 1 1  Dressing or bathing? 0 0  Doing errands, shopping? 0 0  Preparing Food and eating ? N N  Using the Toilet? N N  In the past six months, have you accidently leaked urine? N N  Do you have problems with loss of bowel control? N N  Managing your Medications? N N  Managing your Finances? N N  Housekeeping or managing your Housekeeping? N N    Patient Care Team: Hoy Register, MD as PCP - General (Family Medicine) Rennis Chris, MD as Consulting Physician (Ophthalmology) [facility], Konrad Dolores, MD as Attending Physician (Endocrinology) Freddie Breech, DPM as Consulting Physician (Podiatry)  Indicate any recent Medical Services you may have received from other than [facility] providers in the past year (date may be approximate).     Assessment:   This is a routine wellness examination for Bradley [name].  Hearing/Vision screen Hearing Screening - Comments:: Denies hearing difficulties   Vision Screening - Comments:: up to date with routine eye exams with Dr. Vanessa Barbara    Dietary issues and exercise activities discussed: Current Exercise Habits: The patient does not participate in regular exercise at present   Goals Addressed             This Visit's Progress    COMPLETED: I would like to work on reducing my HgbA1C from 9.4 to <8.0 over the next year.Patient Stated       Interventions Today    Flowsheet Row Most Recent Value  Chronic Disease   Chronic disease during today's visit Diabetes, Other  [Morbid Obesity,  Smoker]  General Interventions   General Interventions Discussed/Reviewed General  Interventions Discussed, General Interventions Reviewed, Annual Eye Exam, Labs, Annual Foot Exam, Lipid Profile, Durable Medical Equipment (DME), Vaccines, Health Screening, Sick Day Rules, [facility], Doctor Visits  Labs Hgb A1c every 3 months  Vaccines COVID-19, Flu, Pneumonia, RSV, Shingles, Tetanus/Pertussis/Diphtheria  Doctor Visits Discussed/Reviewed Doctor Visits Discussed, Doctor Visits Reviewed, Annual Wellness Visits, PCP, Specialist  Health Screening Colonoscopy, Prostate  Durable Medical Equipment (DME) Glucomoter  Exercise Interventions   Exercise Discussed/Reviewed Exercise Discussed, Weight Managment  Weight Management Weight loss  Education Interventions   Education Provided Provided Education  Provided Verbal Education On Nutrition, Foot Care, Eye Care, Labs, Blood Sugar Monitoring, Exercise, Medication, When to see the doctor  Labs Reviewed Hgb A1c, Kidney Function, Lipid Profile  Mental Health Interventions   Mental Health Discussed/Reviewed Mental Health Discussed  [Feels like he is in a good state of mind.]  Nutrition Interventions   Nutrition Discussed/Reviewed Nutrition Discussed, Carbohydrate meal planning, Portion sizes, Decreasing fats  Pharmacy Interventions   Pharmacy Dicussed/Reviewed Pharmacy Topics Discussed, Medication Adherence  Safety Interventions   Safety Discussed/Reviewed Safety Discussed, Safety Reviewed, Fall Risk, Home Safety  Home Safety Assistive Devices  Advanced Directive Interventions   Advanced Directives Discussed/Reviewed Advanced Directives Discussed           Increase physical activity        Depression Screen    02/11/2023    9:43 PM 02/02/2023    2:12 PM 08/04/2022    2:09 PM 04/21/2022    1:42 PM 02/06/2022    2:43 PM 11/23/2020   10:54 AM 08/09/2020   11:11 AM  PHQ 2/9 Scores  PHQ - 2 Score 2 2 2 2  0 1 2  PHQ- 9 Score 7 7 6 7   8     Fall Risk    02/11/2023    9:38 PM 02/11/2023    3:03 PM 02/02/2023    2:12 PM  01/06/2023    3:01 PM 08/04/2022    2:08 PM  Fall Risk   Falls in the past year? 1 1 1 1 1   Number falls in past yr: 0 0 0 1 1  Injury with Fall? 0 0 0 1 0  Comment    hit his head   Risk for fall due to : History of fall(s);Impaired balance/gait;Impaired mobility  History of fall(s) History of fall(s) No Fall Risks  Risk for fall due to: Comment    Fell after getting out of bed. Follows precautions now.   Follow up Falls prevention discussed;Education provided;Falls evaluation completed        FALL RISK PREVENTION PERTAINING TO THE HOME:  Any stairs in or around the home? No  If so, are there any without handrails? No  Home free of loose throw rugs in walkways, pet beds, electrical cords, etc? Yes  Adequate lighting in your home to reduce risk of falls? Yes   ASSISTIVE DEVICES UTILIZED TO PREVENT FALLS:  Life alert? No  Use of a cane, walker or w/c? Yes  Grab bars in the bathroom? No  Shower chair or bench in shower? No  Elevated toilet seat or a handicapped toilet? No   TIMED UP AND GO:  Was the test performed? No . Telephonic visit   Cognitive Function:    02/06/2022    2:45 PM  MMSE - Mini Mental State Exam  Orientation to time 5  Orientation to Place 5  Registration 3  Attention/ Calculation 5  Recall 3  Language- name 2 objects 2  Language- repeat 1  Language- follow 3 step command 3  Language- read & follow direction 1  Write a sentence 1  Copy design 1  Total score 30        02/11/2023    9:46 PM  6CIT Screen  What Year? 0 points  What month? 0 points  What time? 0 points  Count back from 20 0 points  Months in reverse 0 points  Repeat phrase 0 points  Total Score 0 points    Immunizations Immunization History  Administered Date(s) Administered   Influenza,inj,Quad PF,6+ Mos 06/22/2019   Pneumococcal Polysaccharide-23 08/12/2018    TDAP status: Due, Education has been provided regarding the importance of this vaccine. Advised may receive this  vaccine at local pharmacy or Health Dept. Aware to provide a copy of the vaccination record if obtained from local pharmacy or Health Dept. Verbalized acceptance and understanding.  Covid-19 vaccine status: Information provided on how to obtain vaccines.   Qualifies for Shingles Vaccine? No    Screening Tests Health Maintenance  Topic Date Due   COVID-19 Vaccine (1) Never done   DTaP/Tdap/Td (1 - Tdap) Never done   HEMOGLOBIN A1C  02/02/2023   INFLUENZA VACCINE  04/30/2023   OPHTHALMOLOGY EXAM  05/22/2023   FOOT EXAM  01/13/2024   Diabetic kidney evaluation - Urine ACR  02/03/2024   Diabetic kidney evaluation - eGFR measurement  02/04/2024   Medicare Annual Wellness (AWV)  02/11/2024   Hepatitis C Screening  Completed   HIV Screening  Completed   HPV VACCINES  Aged Out    Health Maintenance  Health Maintenance Due  Topic Date Due   COVID-19 Vaccine (1) Never done   DTaP/Tdap/Td (1 - Tdap) Never done   HEMOGLOBIN A1C  02/02/2023    Lung Cancer Screening: (Low Dose CT Chest recommended if Age 2-80 years, 30 pack-year currently smoking OR have quit w/in 15years.) does not qualify.   Lung Cancer Screening Referral: n/a  Additional Screening:  Hepatitis C Screening: does qualify; Completed 11/27/18  Vision Screening: Recommended annual ophthalmology exams for early detection of glaucoma and other disorders of the eye. Is the patient up to date with their annual eye exam?  Yes  Who is the provider or what is the name of the office in which the patient attends annual eye exams? Dr. Vanessa Barbara  If pt is not established with a provider, would they like to be referred to a provider to establish care? No .   Dental Screening: Recommended annual dental exams for proper oral hygiene  Community Resource Referral / Chronic Care Management: CRR required this visit?  No   CCM required this visit?  No      Plan:     I have personally reviewed and noted the following in the  patient's chart:   Medical and social history Use of alcohol, tobacco or illicit drugs  Current medications and supplements including opioid prescriptions. Patient is not currently taking opioid prescriptions. Functional ability and status Nutritional status Physical activity Advanced directives List of other physicians Hospitalizations, surgeries, and ER visits in previous 12 months Vitals Screenings to include cognitive, depression, and falls Referrals and appointments  In addition, I have reviewed and discussed with patient certain preventive protocols, quality metrics, and best practice recommendations. A written personalized care plan for preventive services as well as general preventive health recommendations were provided to patient.     Durwin Nora, LPN  02/11/2023   Due to this being a virtual visit, the after visit summary with patients personalized plan was offered to patient via mail or my-chart.  Patient would like to access on my-chart  Nurse Notes: No concerns

## 2023-02-14 LAB — DRUG SCREEN 12+ALCOHOL+CRT, UR
BENZODIAZ UR QL: NEGATIVE ng/mL
Barbiturate: NEGATIVE ng/mL
Cannabinoids: NEGATIVE ng/mL
Creatinine, Urine: 50.4 mg/dL (ref 20.0–300.0)
Ethanol, Urine: POSITIVE — AB
Meperidine: NEGATIVE ng/mL
OPIATE SCREEN URINE: NEGATIVE ng/mL
Phencyclidine: NEGATIVE ng/mL
Propoxyphene: NEGATIVE ng/mL
Tramadol: POSITIVE — AB

## 2023-02-16 ENCOUNTER — Ambulatory Visit: Payer: Medicare HMO | Attending: Family Medicine | Admitting: Pharmacist

## 2023-02-16 DIAGNOSIS — I825Y2 Chronic embolism and thrombosis of unspecified deep veins of left proximal lower extremity: Secondary | ICD-10-CM | POA: Diagnosis not present

## 2023-02-16 DIAGNOSIS — Z7901 Long term (current) use of anticoagulants: Secondary | ICD-10-CM | POA: Diagnosis not present

## 2023-02-16 LAB — POCT INR: POC INR: 2.9

## 2023-02-17 ENCOUNTER — Ambulatory Visit: Payer: Self-pay | Admitting: *Deleted

## 2023-02-17 NOTE — Patient Outreach (Signed)
  Care Coordination   Follow Up Visit Note   02/17/2023 Name: ZYMIERE ISHII MRN: [id number] DOB: 24-Aug-1981  [name] is a 42 y.o. year old male who sees Hoy Register, MD for primary care. I spoke with  [name] by phone today.  What matters to the patients health and wellness today?  Keep DM managed, decrease A1C.    Goals Addressed             This Visit's Progress    Effective management of DM       Care Coordination Interventions: Provided education to patient about basic DM disease process Reviewed medications with patient and discussed importance of medication adherence Counseled on importance of regular laboratory monitoring as prescribed Discussed plans with patient for ongoing care management follow up and provided patient with direct contact information for care management team Provided patient with written educational materials related to hypo and hyperglycemia and importance of correct treatment Advised patient, providing education and rationale, to check cbg daily and record, calling endocrinology for findings outside established parameters         SDOH assessments and interventions completed:  No     Care Coordination Interventions:  Yes, provided   Interventions Today    Flowsheet Row Most Recent Value  Chronic Disease   Chronic disease during today's visit Diabetes  General Interventions   General Interventions Discussed/Reviewed General Interventions Reviewed, Labs, Annual Eye Exam, Annual Foot Exam, Communication with, Doctor Visits  [Reminded to have annual foot and eye exams.]  Labs Hgb A1c every 6 months  [last A1C done in November 2023, resulted in 9.4]  Doctor Visits Discussed/Reviewed Doctor Visits Reviewed, Specialist, PCP  [facility] appointment with RD on 6/7, PCP on 6/10, and endocrinology on 6/25]  PCP/Specialist Visits Compliance with follow-up visit  Communication with RN  [Will have care guide scheudle follow up with  assigned RNCM.]  Education Interventions   Education Provided Provided Education  Provided Verbal Education On Nutrition, Foot Care, Eye Care, Labs, Blood Sugar Monitoring, When to see the doctor  [Has not been monitoring blood sugar daily, a few days ago it was 218.  does not have lancets, ran out, can't afford refill until 5/31.]  Labs Reviewed Hgb A1c  Nutrition Interventions   Nutrition Discussed/Reviewed Nutrition Reviewed, Adding fruits and vegetables, Carbohydrate meal planning, Decreasing sugar intake       Follow up plan:  Care Guide will schedule follow up with A. Little, RN    Encounter Outcome:  Pt. Visit Completed   Kemper Durie, RN, MSN, [facility] Care Management Care Management Coordinator [phone]

## 2023-02-18 ENCOUNTER — Telehealth: Payer: Self-pay | Admitting: *Deleted

## 2023-02-18 NOTE — Progress Notes (Unsigned)
  Care Coordination Note  02/18/2023 Name: Bradley [name] MRN: [id number] DOB: 03-23-1981  Bradley [name] is a 42 y.o. year old male who is a primary care patient of Hoy Register, MD and is actively engaged with the care management team. I reached out to Bradley [name] by phone today to assist with re-scheduling a follow up visit with the RN Case Manager  Follow up plan: Unsuccessful telephone outreach attempt made. A HIPAA compliant phone message was left for the patient providing contact information and requesting a return call.   [facility]  Care Coordination Care Guide  Direct Dial: [phone]

## 2023-02-19 ENCOUNTER — Ambulatory Visit: Payer: Medicare Other | Admitting: Dietician

## 2023-02-19 NOTE — Progress Notes (Signed)
  Care Coordination Note  02/19/2023 Name: [name] MRN: [id number] DOB: 18-May-1981  Bradley [name] is a 42 y.o. year old male who is a primary care patient of Hoy Register, MD and is actively engaged with the care management team. I reached out to Bradley [name] by phone today to assist with re-scheduling a follow up visit with the RN Case Manager  Follow up plan: Telephone appointment with care management team member scheduled for:03/26/23  [facility] Coordination Care Guide  Direct Dial: [phone]

## 2023-02-24 ENCOUNTER — Other Ambulatory Visit: Payer: Self-pay

## 2023-03-06 ENCOUNTER — Encounter: Payer: Medicare HMO | Attending: Internal Medicine | Admitting: Dietician

## 2023-03-06 ENCOUNTER — Encounter: Payer: Self-pay | Admitting: Dietician

## 2023-03-06 VITALS — Wt 263.0 lb

## 2023-03-06 DIAGNOSIS — E1165 Type 2 diabetes mellitus with hyperglycemia: Secondary | ICD-10-CM | POA: Diagnosis not present

## 2023-03-06 DIAGNOSIS — Z794 Long term (current) use of insulin: Secondary | ICD-10-CM | POA: Insufficient documentation

## 2023-03-06 NOTE — Patient Instructions (Addendum)
Be more active:  Take your dog for a walk a couple times a day  Go to the gym 2 days per week   Drink water more than diet soda.  Consider resuming your insulin.

## 2023-03-06 NOTE — Progress Notes (Signed)
Diabetes Self-Management Education  Visit Type: Follow-up  Appt. Start Time: 1615 Appt. End Time: 1645  03/06/2023  [name], identified by name and date of birth, is a 42 y.o. male with a diagnosis of Diabetes:  .   ASSESSMENT  No new A1C per patient. He is taking the Jardiance. States that he is refusing his [place].  He states that he does not want to stick himself again. His insurance changed and he now qualifies for [employer].  He has signed up but has not gone yet. His new insurance gives him $225 per month which has helped his meal quality. Blood glucose in office 299 one hour after a meal today.  He is not checking his blood glucose at home. Reduced smoking to 1/2 pack per day but is vaping more - coughing less.   History includes Type 2 Diabetes (2019), DVT, Bipolar, ADHD, neuropathy, smoking, memory issues, polycythemia, thrombocytopenia, history of DVT/PE Labs noted to include:  A1C  9.4% 08/04/2022, 9.8% 04/23/2022 decreased from 12.7% 08/16/2021,  cholesterol 193, Triglycerides 365, HDL 40, LDL 92 08/09/2020 Medications include:Trulicity(stopped), glipizide (discontinued), actos (discontinued), Jardiance, Tresiba 70 units q HS, Novolog before meals (not taking), coumadin, Abilify and other medications that contribute to weight gain. He stopped pioglitizone after 3 days due to stomach cramps. He could not tolerated Metformin due to bowel incontinence.    Sleep:  Eats late (10pm - midnight).  Goes to sleep late.  Wakes about 10:30 am (although later now) Support:  [facility] [employer] [facility], patient of Dr. Lonzo Cloud at [facility], PCP Hoy Register, MD   Weight: 263 lbs 03/06/2023 262 lbs 12/08/2022 260 lbs 09/08/2022 261 lbs 07/21/2022 263 lbs 04/21/2022 278 lbs 12/19/2021 279 lbs 10/24/2021 282 lbs 08/16/2021 loss likely due to uncontrolled blood glucose 295 lbs 02/14/21 306 lbs 47/2022 306 lbs 11/23/2020 425 lbs  highest adult weight 2 years ago (loss presumably due to uncontrolled blood sugar) Lowest adult weight 235 lbs about 8 years ago and he does not recall circumstances of increased weight gain.   Patient lives with his girlfriend.  She has type 2 diabetes.  He does the shopping and cooking.  Dislikes most vegetables. Drinking mostly sugar free options. He is on disability.  Finances are a concern. His mother passed away 2020/11/29 from complications of diabetes. Girlfriend  goes to the gym 2-3 times per week [facility]).  Patient is not motivated to go. Complains of neuropathy pain which worsens with exercise.  Weight 263 lb (119.3 kg). Body mass index is 35.67 kg/m.   Diabetes Self-Management Education - 03/06/23 1700       Visit Information   Visit Type Follow-up      Psychosocial Assessment   Patient Belief/Attitude about Diabetes Other (comment)   resistant to change   What is the hardest part about your diabetes right now, causing you the most concern, or is the most worrisome to you about your diabetes?   Taking/obtaining medications;Making healty food and beverage choices;Being active;Checking blood sugar    Self-care barriers Lack of material resources    Self-management support Doctor's office;CDE visits;Family    Other persons present Spouse/SO;Patient    Patient Concerns Nutrition/Meal planning;Glycemic Control;Weight Control;Monitoring;Healthy Lifestyle    Special Needs None    Preferred Learning Style No preference indicated    Learning Readiness Ready      Pre-Education Assessment   Patient understands the diabetes disease and treatment process. Comprehends key points    Patient understands  incorporating nutritional management into lifestyle. Needs Review    Patient undertands incorporating physical activity into lifestyle. Needs Review    Patient understands using medications safely. Needs Review    Patient understands monitoring blood glucose, interpreting and using results  Needs Review    Patient understands prevention, detection, and treatment of acute complications. Comprehends key points    Patient understands prevention, detection, and treatment of chronic complications. Needs Review    Patient understands how to develop strategies to address psychosocial issues. Needs Review    Patient understands how to develop strategies to promote health/change behavior. Needs Review      Complications   Postprandial Blood glucose range (mg/dL) >161      Dietary Intake   Breakfast none    Lunch McDouble and 5 chicken McNuggets, 1/2 large regular coke    Snack (afternoon) 2 slices picca    Dinner BBQ chicken, mac and cheese (3/4 cup)    Beverage(s) Diet Mt. Dew, flavored water, sweet tea, regular coke (occasional, sugar free Gatorade (occasional)      Activity / Exercise   Activity / Exercise Type ADL's      Patient Education   Previous Diabetes Education Yes (please comment)   frequent   Healthy Eating Meal options for control of blood glucose level and chronic complications.    Being Active Role of exercise on diabetes management, blood pressure control and cardiac health.;Helped patient identify appropriate exercises in relation to his/her diabetes, diabetes complications and other health issue.    Medications Reviewed patients medication for diabetes, action, purpose, timing of dose and side effects.    Chronic complications Relationship between chronic complications and blood glucose control    Lifestyle and Health Coping Lifestyle issues that need to be addressed for better diabetes care;Review risk of smoking and offered smoking cessation      Individualized Goals (developed by patient)   Nutrition General guidelines for healthy choices and portions discussed    Physical Activity Exercise 3-5 times per week;30 minutes per day    Medications take my medication as prescribed    Monitoring  Test my blood glucose as discussed    Problem Solving Eating  Pattern;Addressing barriers to behavior change      Patient Self-Evaluation of Goals - Patient rates self as meeting previously set goals (% of time)   Nutrition 25 - 50% (sometimes)    Physical Activity < 25% (hardly ever/never)    Medications 50 - 75 % (half of the time)    Monitoring < 25% (hardly ever/never)    Problem Solving and behavior change strategies  < 25% (hardly ever/never)    Reducing Risk (treating acute and chronic complications) 25 - 50% (sometimes)    Health Coping 25 - 50% (sometimes)      Post-Education Assessment   Patient understands the diabetes disease and treatment process. Comprehends key points    Patient understands incorporating nutritional management into lifestyle. Needs Review    Patient undertands incorporating physical activity into lifestyle. Comprehends key points    Patient understands using medications safely. Comphrehends key points    Patient understands monitoring blood glucose, interpreting and using results Comprehends key points    Patient understands prevention, detection, and treatment of acute complications. Comprehends key points    Patient understands prevention, detection, and treatment of chronic complications. Comprehends key points    Patient understands how to develop strategies to address psychosocial issues. Comprehends key points    Patient understands how to develop strategies to  promote health/change behavior. Comprehends key points      Outcomes   Expected Outcomes Demonstrated interest in learning but significant barriers to change    Future DMSE 3-4 months    Program Status Not Completed      Subsequent Visit   Since your last visit have you experienced any weight changes? Gain    Weight Loss (lbs) 1             Individualized Plan for Diabetes Self-Management Training:   Learning Objective:  Patient will have a greater understanding of diabetes self-management. Patient education plan is to attend individual and/or  group sessions per assessed needs and concerns.   Plan:   Patient Instructions  Be more active:  Take your dog for a walk a couple times a day  Go to the gym 2 days per week   Drink water more than diet soda.  Consider resuming your insulin.     Expected Outcomes:  Demonstrated interest in learning but significant barriers to change  Education material provided:   If problems or questions, patient to contact team via:  Phone  Future DSME appointment: 3-4 months

## 2023-03-09 ENCOUNTER — Other Ambulatory Visit: Payer: Medicare HMO

## 2023-03-09 ENCOUNTER — Other Ambulatory Visit: Payer: Self-pay

## 2023-03-09 ENCOUNTER — Encounter: Payer: Self-pay | Admitting: Family Medicine

## 2023-03-09 ENCOUNTER — Ambulatory Visit: Payer: Medicare HMO | Attending: Family Medicine | Admitting: Family Medicine

## 2023-03-09 VITALS — BP 101/68 | HR 102 | Ht 72.0 in | Wt 261.8 lb

## 2023-03-09 DIAGNOSIS — Z794 Long term (current) use of insulin: Secondary | ICD-10-CM

## 2023-03-09 DIAGNOSIS — Z7901 Long term (current) use of anticoagulants: Secondary | ICD-10-CM | POA: Diagnosis not present

## 2023-03-09 DIAGNOSIS — E1149 Type 2 diabetes mellitus with other diabetic neurological complication: Secondary | ICD-10-CM

## 2023-03-09 LAB — POCT INR: POC INR: 2.5

## 2023-03-09 MED ORDER — TRAMADOL HCL 50 MG PO TABS
50.0000 mg | ORAL_TABLET | Freq: Two times a day (BID) | ORAL | 2 refills | Status: DC | PRN
Start: 2023-03-09 — End: 2023-06-12
  Filled 2023-03-09 – 2023-03-24 (×2): qty 60, 30d supply, fill #0
  Filled 2023-04-21: qty 60, 30d supply, fill #1
  Filled 2023-05-18: qty 60, 30d supply, fill #2

## 2023-03-09 NOTE — Patient Instructions (Signed)
Opioid Pain Medicine Management Opioids are powerful medicines that are used to treat moderate to severe pain. When used for short periods of time, they can help you to: Sleep better. Do better in physical or occupational therapy. Feel better in the first few days after an injury. Recover from surgery. Opioids should be taken with the supervision of a trained health care provider. They should be taken for the shortest period of time possible. This is because opioids can be addictive, and the longer you take opioids, the greater your risk of addiction. This addiction can also be called opioid use disorder. What are the risks? Using opioid pain medicines for longer than 3 days increases your risk of side effects. Side effects include: Constipation. Nausea and vomiting. Breathing difficulties (respiratory depression). Drowsiness. Confusion. Opioid use disorder. Itching. Taking opioid pain medicine for a long period of time can affect your ability to do daily tasks. It also puts you at risk for: Motor vehicle crashes. Depression. Suicide. Heart attack. Overdose, which can be life-threatening. What is a pain treatment plan? A pain treatment plan is an agreement between you and your health care provider. Pain is unique to each person, and treatments vary depending on your condition. To manage your pain, you and your health care provider need to work together. To help you do this: Discuss the goals of your treatment, including how much pain you might expect to have and how you will manage the pain. Review the risks and benefits of taking opioid medicines. Remember that a good treatment plan uses more than one approach and minimizes the chance of side effects. Be honest about the amount of medicines you take and about any drug or alcohol use. Get pain medicine prescriptions from only one health care provider. Pain can be managed with many types of alternative treatments. Ask your health care  provider to refer you to one or more specialists who can help you manage pain through: Physical or occupational therapy. Counseling (cognitive behavioral therapy). Good nutrition. Biofeedback. Massage. Meditation. Non-opioid medicine. Following a gentle exercise program. How to use opioid pain medicine Taking medicine Take your pain medicine exactly as told by your health care provider. Take it only when you need it. If your pain gets less severe, you may take less than your prescribed dose if your health care provider approves. If you are not having pain, do nottake pain medicine unless your health care provider tells you to take it. If your pain is severe, do nottry to treat it yourself by taking more pills than instructed on your prescription. Contact your health care provider for help. Write down the times when you take your pain medicine. It is easy to become confused while on pain medicine. Writing the time can help you avoid overdose. Take other over-the-counter or prescription medicines only as told by your health care provider. Keeping yourself and others safe  While you are taking opioid pain medicine: Do not drive, use machinery, or power tools. Do not sign legal documents. Do not drink alcohol. Do not take sleeping pills. Do not supervise children by yourself. Do not do activities that require climbing or being in high places. Do not go to a lake, river, ocean, spa, or swimming pool. Do not share your pain medicine with anyone. Keep pain medicine in a locked cabinet or in a secure area where pets and children cannot reach it. Stopping your use of opioids If you have been taking opioid medicine for more than a few weeks,   you may need to slowly decrease (taper) how much you take until you stop completely. Tapering your use of opioids can decrease your risk of symptoms of withdrawal, such as: Pain and cramping in the  abdomen. Nausea. Sweating. Sleepiness. Restlessness. Uncontrollable shaking (tremors). Cravings for the medicine. Do not attempt to taper your use of opioids on your own. Talk with your health care provider about how to do this. Your health care provider may prescribe a step-down schedule based on how much medicine you are taking and how long you have been taking it. Getting rid of leftover pills Do not save any leftover pills. Get rid of leftover pills safely by: Taking the medicine to a prescription take-back program. This is usually offered by the county or law enforcement. Bringing them to a pharmacy that has a drug disposal container. Flushing them down the toilet. Check the label or package insert of your medicine to see whether this is safe to do. Throwing them out in the trash. Check the label or package insert of your medicine to see whether this is safe to do. If it is safe to throw it out, remove the medicine from the original container, put it into a sealable bag or container, and mix it with used coffee grounds, food scraps, dirt, or cat litter before putting it in the trash. Follow these instructions at home: Activity Do exercises as told by your health care provider. Avoid activities that make your pain worse. Return to your normal activities as told by your health care provider. Ask your health care provider what activities are safe for you. General instructions You may need to take these actions to prevent or treat constipation: Drink enough fluid to keep your urine pale yellow. Take over-the-counter or prescription medicines. Eat foods that are high in fiber, such as beans, whole grains, and fresh fruits and vegetables. Limit foods that are high in fat and processed sugars, such as fried or sweet foods. Keep all follow-up visits. This is important. Where to find support If you have been taking opioids for a long time, you may benefit from receiving support for quitting  from a local support group or counselor. Ask your health care provider for a referral to these resources in your area. Where to find more information Centers for Disease Control and Prevention (CDC): FootballExhibition.com.br [place] Food and Drug Administration (FDA): PumpkinSearch.com.ee Get help right away if: You may have taken too much of an opioid (overdosed). Common symptoms of an overdose: Your breathing is slower or more shallow than normal. You have a very slow heartbeat (pulse). You have slurred speech. You have nausea and vomiting. Your pupils become very small. You have other potential symptoms: You are very confused. You faint or feel like you will faint. You have cold, clammy skin. You have blue lips or fingernails. You have thoughts of harming yourself or harming others. These symptoms may represent a serious problem that is an emergency. Do not wait to see if the symptoms will go away. Get medical help right away. Call your local emergency services (911 in the [place] Do not drive yourself to the hospital.  If you ever feel like you may hurt yourself or others, or have thoughts about taking your own life, get help right away. Go to your nearest emergency department or: Call your local emergency services (911 in the [place] Call the [facility] (763-300-3487 in the [place] Call a suicide crisis helpline, such as the National Suicide Prevention Lifeline at  306-483-2759 or 988 in the [place] This is open 24 hours a day in the [place] Text the Crisis Text Line at (912)335-8467 (in the [place] Summary Opioid medicines can help you manage moderate to severe pain for a short period of time. A pain treatment plan is an agreement between you and your health care provider. Discuss the goals of your treatment, including how much pain you might expect to have and how you will manage the pain. If you think that you or someone else may have taken too much of an opioid, get medical help right away. This  information is not intended to replace advice given to you by your health care provider. Make sure you discuss any questions you have with your health care provider. Document Revised: 04/10/2021 Document Reviewed: 12/26/2020 Elsevier Patient Education  2024 [employer].

## 2023-03-09 NOTE — Progress Notes (Signed)
Subjective:  Patient ID: Bradley [name], male    DOB: 07/18/1981  Age: 42 y.o. MRN: [id number]  CC: Coagulation Disorder   HPI Bradley [name] is a 42 y.o. year old male with a history of morbid obesity, type 2 diabetes mellitus (A1c 9.4 managed by endocrine, non compliance,diabetic neuropathy (unable to tolerate gabapentin, Lyrica), Bipolar disorder (he goes to [facility]) , previous DVT in 2017, PE (diagnosed in 10/2018 - on chronic anticoagulation with Coumadin).   Interval History:  He had scheduled this visit for his INR check as he had complained insurance would no longer be covering the clinical pharmacy visits for his INR check. INR therapeutic today at 2.5. We had discussed with the billing department and he can continue to have his INR checks with clinical pharmacy.  Request refill of tramadol which he takes for diabetic neuropathy due to intolerance of gabapentin, Lyrica. He continues to smoke but has cut down and has started vaping with plans of quitting smoking cigarettes.  He sees Dr Lonzo Cloud next month for diabetes management.  Past Medical History:  Diagnosis Date   ADHD    ADHD (attention deficit hyperactivity disorder)    Bipolar 1 disorder (HCC)    Bipolar disorder (HCC)    Depression    Diabetes mellitus without complication (HCC)    Hyperlipidemia    Morbid obesity (HCC)    Obesity    Panic attack    Varicose veins of both lower extremities with pain     Past Surgical History:  Procedure Laterality Date   surgery on meatus as a child      Family History  Problem Relation Age of Onset   Rheum arthritis Mother    Diabetes Mother    Heart failure Mother    Stroke Mother    ADD / ADHD Mother    Anxiety disorder Mother    Arthritis Mother    Depression Mother    Drug abuse Mother    Alcohol abuse Father    Glaucoma Maternal Grandmother    COPD Maternal Grandmother    Lung cancer Paternal Grandmother        smoker     Social History   Socioeconomic History   Marital status: Single    Spouse name: Not on file   Number of children: Not on file   Years of education: Not on file   Highest education level: Not on file  Occupational History   Not on file  Tobacco Use   Smoking status: Every Day    Packs/day: 1.00    Years: 15.00    Additional pack years: 0.00    Total pack years: 15.00    Types: Cigarettes   Smokeless tobacco: Never  Vaping Use   Vaping Use: Never used  Substance and Sexual Activity   Alcohol use: Yes    Alcohol/week: 1.0 standard drink of alcohol    Types: 1 Shots of liquor per week    Comment: Only on my birthday and New Year's   Drug use: No   Sexual activity: Not Currently    Birth control/protection: Abstinence  Other Topics Concern   Not on file  Social History Narrative   ** Merged History Encounter **       Social Determinants of Health   Financial Resource Strain: Low Risk  (02/11/2023)   Overall Financial Resource Strain (CARDIA)    Difficulty of Paying Living Expenses: Not hard at all  Food Insecurity: No Food Insecurity (02/11/2023)  Hunger Vital Sign    Worried About Running Out of Food in the Last Year: Never true    Ran Out of Food in the Last Year: Never true  Transportation Needs: No Transportation Needs (02/11/2023)   PRAPARE - Administrator, Civil Service (Medical): No    Lack of Transportation (Non-Medical): No  Physical Activity: Inactive (02/11/2023)   Exercise Vital Sign    Days of Exercise per Week: 0 days    Minutes of Exercise per Session: 0 min  Stress: No Stress Concern Present (02/11/2023)   [employer] of Occupational Health - Occupational Stress Questionnaire    Feeling of Stress : Only a little  Social Connections: Socially Isolated (02/11/2023)   Social Connection and Isolation Panel [NHANES]    Frequency of Communication with Friends and Family: Twice a week    Frequency of Social Gatherings with Friends and  Family: Once a week    Attends Religious Services: Never    Diplomatic Services operational officer: Patient declined    Attends Banker Meetings: Never    Marital Status: Never married    Allergies  Allergen Reactions   Actos [Pioglitazone] Other (See Comments)    Stomach cramps   Gabapentin Other (See Comments)    Crying spells   Lyrica [Pregabalin] Other (See Comments)    Makes the patient somnolent    Outpatient Medications Prior to Visit  Medication Sig Dispense Refill   ARIPiprazole (ABILIFY) 5 MG tablet Take 1 tablet (5 mg total) by mouth at bedtime. 30 tablet 2   atorvastatin (LIPITOR) 20 MG tablet Take 1 tablet (20 mg total) by mouth daily. 90 tablet 2   Blood Glucose Calibration (ACCU-CHEK ACTIVE GLUCOSE CONT VI) by In Vitro route.     doxepin (SINEQUAN) 50 MG capsule Take 1 capsule (50 mg total) by mouth as needed for sleep 30 capsule 2   DULoxetine (CYMBALTA) 30 MG capsule Take 30 mg by mouth daily.     DULoxetine (CYMBALTA) 60 MG capsule Take 1 capsule(s) by mouth at bedtime for anxiety/depression 30 capsule 2   empagliflozin (JARDIANCE) 25 MG TABS tablet Take 1 tablet (25 mg total) by mouth daily before breakfast. 90 tablet 3   gentamicin cream (GARAMYCIN) 0.1 % Apply to wound left lower extremity once daily until healed. 30 g 1   glucose blood (ACCU-CHEK GUIDE) test strip 1 each by Other route 3 (three) times daily. Use as instructed 300 each 3   insulin degludec (TRESIBA FLEXTOUCH) 100 UNIT/ML FlexTouch Pen Inject 70 Units into the skin daily. 60 mL 6   Insulin Pen Needle (B-D UF III MINI PEN NEEDLES) 31G X 5 MM MISC Use as directed 3 times daily. 300 each 3   mupirocin ointment (BACTROBAN) 2 % Apply to right great toe once daily. 30 g 1   OneTouch Delica Lancets 33G MISC 1 Device by Does not apply route 3 (three) times daily. 300 each 3   sildenafil (VIAGRA) 50 MG tablet Take 1 tablet (50 mg total) by mouth daily as needed for erectile dysfunction. At  least 24 hours between doses 10 tablet 1   warfarin (COUMADIN) 2.5 MG tablet TAKE 1 TABLET BY MOUTH DAILY AS DIRECTED BY THE COUMADIN CLINIC. 90 tablet 1   traMADol (ULTRAM) 50 MG tablet Take 1 tablet (50 mg total) by mouth every 12 (twelve) hours as needed. 60 tablet 2   No facility-administered medications prior to visit.     ROS Review  of Systems  Constitutional:  Negative for activity change and appetite change.  HENT:  Negative for sinus pressure and sore throat.   Respiratory:  Negative for chest tightness, shortness of breath and wheezing.   Cardiovascular:  Negative for chest pain and palpitations.  Gastrointestinal:  Negative for abdominal distention, abdominal pain and constipation.  Genitourinary: Negative.   Musculoskeletal: Negative.   Psychiatric/Behavioral:  Negative for behavioral problems and dysphoric mood.     Objective:  BP 101/68   Pulse (!) 102   Ht 6' (1.829 m)   Wt 261 lb 12.8 oz (118.8 kg)   SpO2 98%   BMI 35.51 kg/m      03/09/2023    2:21 PM 03/06/2023    5:07 PM 02/11/2023    9:36 PM  BP/Weight  Systolic BP 101    Diastolic BP 68    Wt. (Lbs) 261.8 263 262  BMI 35.51 kg/m2 35.67 kg/m2 35.53 kg/m2      Physical Exam Constitutional:      Appearance: He is well-developed.  Cardiovascular:     Rate and Rhythm: Normal rate.     Heart sounds: Normal heart sounds. No murmur heard. Pulmonary:     Effort: Pulmonary effort is normal.     Breath sounds: Normal breath sounds. No wheezing or rales.  Chest:     Chest wall: No tenderness.  Abdominal:     General: Bowel sounds are normal. There is no distension.     Palpations: Abdomen is soft. There is no mass.     Tenderness: There is no abdominal tenderness.  Musculoskeletal:        General: Normal range of motion.     Right lower leg: No edema.     Left lower leg: No edema.  Neurological:     Mental Status: He is alert and oriented to person, place, and time.  Psychiatric:        Mood and  Affect: Mood normal.        Latest Ref Rng & Units 02/04/2023    8:41 AM 02/03/2023    2:31 PM 09/24/2022   12:49 PM  CMP  Glucose 70 - 99 mg/dL 161  CANCELED  096   BUN 6 - 24 mg/dL 14  CANCELED  16   Creatinine 0.76 - 1.27 mg/dL 0.45  CANCELED  4.09   Sodium 134 - 144 mmol/L 137  CANCELED  138   Potassium 3.5 - 5.2 mmol/L 4.4  CANCELED  4.5   Chloride 96 - 106 mmol/L 98  CANCELED  103   CO2 20 - 29 mmol/L 21  CANCELED  29   Calcium 8.7 - 10.2 mg/dL 9.2  CANCELED  9.8   Total Protein 6.0 - 8.5 g/dL 7.0  CANCELED  7.2   Total Bilirubin 0.0 - 1.2 mg/dL 0.7  CANCELED  0.8   Alkaline Phos 44 - 121 IU/L 74  CANCELED  62   AST 0 - 40 IU/L 25  CANCELED  20   ALT 0 - 44 IU/L 35  CANCELED  24     Lipid Panel     Component Value Date/Time   CHOL 130 02/04/2023 0841   TRIG 110 02/04/2023 0841   HDL 48 02/04/2023 0841   CHOLHDL 3.9 12/06/2020 1456   CHOLHDL 4.0 11/12/2016 1613   VLDL 28 11/12/2016 1613   LDLCALC 62 02/04/2023 0841    CBC    Component Value Date/Time   WBC 9.2 09/24/2022 1249   WBC  9.5 04/19/2020 2357   RBC 6.59 (H) 09/24/2022 1249   HGB 19.0 (H) 09/24/2022 1249   HGB 18.9 (H) 12/06/2020 1456   HCT 55.0 (H) 09/24/2022 1249   HCT 57.0 (H) 12/06/2020 1456   PLT 98 (L) 09/24/2022 1249   PLT 120 (L) 12/06/2020 1456   MCV 83.5 09/24/2022 1249   MCV 84 12/06/2020 1456   MCH 28.8 09/24/2022 1249   MCHC 34.5 09/24/2022 1249   RDW 13.9 09/24/2022 1249   RDW 13.1 12/06/2020 1456   LYMPHSABS 2.2 09/24/2022 1249   LYMPHSABS 2.0 12/06/2020 1456   MONOABS 0.6 09/24/2022 1249   EOSABS 0.2 09/24/2022 1249   EOSABS 0.1 12/06/2020 1456   BASOSABS 0.1 09/24/2022 1249   BASOSABS 0.1 12/06/2020 1456    Lab Results  Component Value Date   HGBA1C 9.4 (A) 08/04/2022    Lab Results  Component Value Date   INR 2.5 03/09/2023   INR 2.9 02/16/2023   INR 2.4 01/15/2023    Assessment & Plan:  1. Long term (current) use of anticoagulants Therapeutic INR 2.5 Continue  current dose of Coumadin Continue to follow-up with clinical pharmacist as we have verified his visits will be covered by insurance - POCT INR  2. Other diabetic neurological complication associated with type 2 diabetes mellitus (HCC) Stable on tramadol Counseled on smoking cessation. - traMADol (ULTRAM) 50 MG tablet; Take 1 tablet (50 mg total) by mouth every 12 (twelve) hours as needed.  Dispense: 60 tablet; Refill: 2    Meds ordered this encounter  Medications   traMADol (ULTRAM) 50 MG tablet    Sig: Take 1 tablet (50 mg total) by mouth every 12 (twelve) hours as needed.    Dispense:  60 tablet    Refill:  2    Follow-up: Return in about 1 month (around 04/08/2023) for INR check with Franky Macho, Medical conditions with PCP keep previously scheduled appointment.       Hoy Register, MD, FAAFP. [facility] [place], [place] [phone]   03/09/2023, 2:44 PM

## 2023-03-13 ENCOUNTER — Other Ambulatory Visit: Payer: Self-pay

## 2023-03-18 NOTE — Progress Notes (Unsigned)
    Pharmacy Anticoagulation Clinic  Subjective: Patient presents today for INR monitoring. Anticoagulation indication is acute PE and chronic DVT.   Current dose of warfarin: 2.5 mg daily (weekly 17.5 mg)  Adherence to warfarin:  Signs/symptoms of bleeding: Recent changes in diet: Recent changes in medications: Upcoming procedures that may impact anticoagulation:   Objective: Today's INR =   Lab Results  Component Value Date   INR 2.5 03/09/2023   INR 2.9 02/16/2023   INR 2.4 01/15/2023     Assessment and Plan: Anticoagulation: Patient is {therapeutic/subtherapeutic/supratherapeutic:12803} based on patient's INR of *** and patient's INR goal of 2-3. Will {Add/stop/incr/decr:18779} current warfarin dose: ***  Patient verbalized understanding and was provided with written instructions. Next INR check planned for ***.

## 2023-03-19 ENCOUNTER — Ambulatory Visit: Payer: Medicare HMO | Admitting: Pharmacist

## 2023-03-24 ENCOUNTER — Other Ambulatory Visit: Payer: Self-pay

## 2023-03-24 ENCOUNTER — Encounter: Payer: Self-pay | Admitting: Internal Medicine

## 2023-03-24 ENCOUNTER — Ambulatory Visit (INDEPENDENT_AMBULATORY_CARE_PROVIDER_SITE_OTHER): Payer: Medicare HMO | Admitting: Internal Medicine

## 2023-03-24 VITALS — BP 122/70 | HR 80 | Ht 72.0 in | Wt 261.0 lb

## 2023-03-24 DIAGNOSIS — E1149 Type 2 diabetes mellitus with other diabetic neurological complication: Secondary | ICD-10-CM

## 2023-03-24 DIAGNOSIS — Z794 Long term (current) use of insulin: Secondary | ICD-10-CM | POA: Diagnosis not present

## 2023-03-24 DIAGNOSIS — E781 Pure hyperglyceridemia: Secondary | ICD-10-CM

## 2023-03-24 DIAGNOSIS — E1165 Type 2 diabetes mellitus with hyperglycemia: Secondary | ICD-10-CM

## 2023-03-24 DIAGNOSIS — E1142 Type 2 diabetes mellitus with diabetic polyneuropathy: Secondary | ICD-10-CM | POA: Diagnosis not present

## 2023-03-24 LAB — POCT GLUCOSE (DEVICE FOR HOME USE): POC Glucose: 282 mg/dl — AB (ref 70–99)

## 2023-03-24 LAB — POCT GLYCOSYLATED HEMOGLOBIN (HGB A1C): Hemoglobin A1C: 9.4 % — AB (ref 4.0–5.6)

## 2023-03-24 MED ORDER — ATORVASTATIN CALCIUM 20 MG PO TABS
20.0000 mg | ORAL_TABLET | Freq: Every day | ORAL | 3 refills | Status: DC
Start: 2023-03-24 — End: 2024-02-02
  Filled 2023-03-24: qty 90, 90d supply, fill #0
  Filled 2023-07-01: qty 90, 90d supply, fill #1
  Filled 2023-09-27: qty 90, 90d supply, fill #2
  Filled 2023-12-27: qty 90, 90d supply, fill #3

## 2023-03-24 MED ORDER — EMPAGLIFLOZIN 25 MG PO TABS
25.0000 mg | ORAL_TABLET | Freq: Every day | ORAL | 3 refills | Status: DC
  Filled 2023-03-24 – 2023-05-13 (×2): qty 90, 90d supply, fill #0
  Filled 2023-08-10: qty 90, 90d supply, fill #1

## 2023-03-24 MED ORDER — RYBELSUS 7 MG PO TABS
7.0000 mg | ORAL_TABLET | Freq: Every day | ORAL | 3 refills | Status: DC
  Filled 2023-03-24: qty 90, 90d supply, fill #0

## 2023-03-24 NOTE — Patient Instructions (Addendum)
-   Start Rybelsus 3 mg daily for a month, then increase to 7 mg daily - Continue   Jardiance 25 mg, 1 tablet daily      HOW TO TREAT LOW BLOOD SUGARS (Blood sugar LESS THAN 70 MG/DL) Please follow the RULE OF 15 for the treatment of hypoglycemia treatment (when your (blood sugars are less than 70 mg/dL)   STEP 1: Take 15 grams of carbohydrates when your blood sugar is low, which includes:  3-4 GLUCOSE TABS  OR 3-4 OZ OF JUICE OR REGULAR SODA OR ONE TUBE OF GLUCOSE GEL    STEP 2: RECHECK blood sugar in 15 MINUTES STEP 3: If your blood sugar is still low at the 15 minute recheck --> then, go back to STEP 1 and treat AGAIN with another 15 grams of carbohydrates.

## 2023-03-24 NOTE — Progress Notes (Signed)
Name: Bradley [name]  Age/ Sex: 42 y.o., male   MRN/ DOB: [id number], 09/24/1981     PCP: Hoy Register, MD   Reason for Endocrinology Evaluation: Type 2 Diabetes Mellitus  Initial Endocrine Consultative Visit: 10/18/2020    PATIENT IDENTIFIER: [name] is a 42 y.o. male with a past medical history of T2Dm, DVT and bipolar disorder. The patient has followed with Endocrinology clinic since 10/18/2020 for consultative assistance with management of his diabetes.  DIABETIC HISTORY:  Bradley [name] was diagnosed with DM yrs ago. Basaglar made him feel sluggish. His hemoglobin A1c has ranged from 7.7% in 2019, peaking at 14.9% in 2021.   On his initial visit to our clinic his A1c was 13.6 %, he DECLINED insulin. He was on Glipizide and Trulicity, we increased Glipizide, Trulicity and added Pioglitazone. By the time he went  To see our RD in 12/2020 he was off pioglitazone  Due to abdominal cramps    Stopped Glipizide and started basal insulin 12/2020  We stop Trulicity by March 2023 as he was not taking it and he would forget to take it, we will started him on Jardiance instead as his daily tablet to improve compliance  He self discontinued basal insulin 2024   He is disabled due to mental health   SUBJECTIVE:   During the last visit (04/23/2022): A1c 9.4%      Today (03/24/2023): Bradley [name] is here for a follow up on diabetes management.  He is accompanied by Vikki Ports.  He does not check his glucose and has no interest in doing so.   He has self discontinued basal insulin, because he does not like to " stab" himself Denies nausea, vomiting Denies constipation or diarrhea       HOME DIABETES REGIMEN:  Jardiance 25 mg daily Tresiba 70 units daily-not taking     Statin: no ACE-I/ARB: no Prior Diabetic Education: yes   METER DOWNLOAD SUMMARY: Did not bring    DIABETIC COMPLICATIONS: Microvascular complications:  neuropathy Denies: CKD  retinopathy Last Eye Exam: Completed 05/21/2021  Macrovascular complications:   Denies: CAD, CVA, PVD   HISTORY:  Past Medical History:  Past Medical History:  Diagnosis Date   ADHD    ADHD (attention deficit hyperactivity disorder)    Bipolar 1 disorder (HCC)    Bipolar disorder (HCC)    Depression    Diabetes mellitus without complication (HCC)    Hyperlipidemia    Morbid obesity (HCC)    Obesity    Panic attack    Varicose veins of both lower extremities with pain    Past Surgical History:  Past Surgical History:  Procedure Laterality Date   surgery on meatus as a child     Social History:  reports that he has been smoking cigarettes. He has a 15.00 pack-year smoking history. He has never used smokeless tobacco. He reports current alcohol use of about 1.0 standard drink of alcohol per week. He reports that he does not use drugs. Family History:  Family History  Problem Relation Age of Onset   Rheum arthritis Mother    Diabetes Mother    Heart failure Mother    Stroke Mother    ADD / ADHD Mother    Anxiety disorder Mother    Arthritis Mother    Depression Mother    Drug abuse Mother    Alcohol abuse Father    Glaucoma Maternal Grandmother    COPD Maternal Grandmother    Lung cancer Paternal  Grandmother        smoker     HOME MEDICATIONS: Allergies as of 03/24/2023       Reactions   Actos [pioglitazone] Other (See Comments)   Stomach cramps   Gabapentin Other (See Comments)   Crying spells   Lyrica [pregabalin] Other (See Comments)   Makes the patient somnolent        Medication List        Accurate as of March 24, 2023  3:04 PM. If you have any questions, ask your nurse or doctor.          ACCU-CHEK ACTIVE GLUCOSE CONT VI by In Vitro route.   Accu-Chek Guide test strip Generic drug: glucose blood 1 each by Other route 3 (three) times daily. Use as instructed   ARIPiprazole 5 MG tablet Commonly known as: ABILIFY Take 1 tablet (5 mg  total) by mouth at bedtime.   atorvastatin 20 MG tablet Commonly known as: LIPITOR Take 1 tablet (20 mg total) by mouth daily.   B-D UF III MINI PEN NEEDLES 31G X 5 MM Misc Generic drug: Insulin Pen Needle Use as directed 3 times daily.   doxepin 50 MG capsule Commonly known as: SINEQUAN Take 1 capsule (50 mg total) by mouth as needed for sleep   DULoxetine 30 MG capsule Commonly known as: CYMBALTA Take 30 mg by mouth daily.   DULoxetine 60 MG capsule Commonly known as: CYMBALTA Take 1 capsule(s) by mouth at bedtime for anxiety/depression   gentamicin cream 0.1 % Commonly known as: GARAMYCIN Apply to wound left lower extremity once daily until healed.   Jardiance 25 MG Tabs tablet Generic drug: empagliflozin Take 1 tablet (25 mg total) by mouth daily before breakfast.   mupirocin ointment 2 % Commonly known as: BACTROBAN Apply to right great toe once daily.   OneTouch Delica Lancets 33G Misc 1 Device by Does not apply route 3 (three) times daily.   sildenafil 50 MG tablet Commonly known as: Viagra Take 1 tablet (50 mg total) by mouth daily as needed for erectile dysfunction. At least 24 hours between doses   traMADol 50 MG tablet Commonly known as: ULTRAM Take 1 tablet (50 mg total) by mouth every 12 (twelve) hours as needed.   Bradley [name] FlexTouch 100 UNIT/ML FlexTouch Pen Generic drug: insulin degludec Inject 70 Units into the skin daily.   warfarin 2.5 MG tablet Commonly known as: COUMADIN Take as directed by the anticoagulation clinic. If you are unsure how to take this medication, talk to your nurse or doctor. Original instructions: TAKE 1 TABLET BY MOUTH DAILY AS DIRECTED BY THE COUMADIN CLINIC.         OBJECTIVE:   Vital Signs: BP 122/70 (BP Location: Left Arm, Patient Position: Sitting, Cuff Size: Small)   Pulse 80   Ht 6' (1.829 m)   Wt 261 lb (118.4 kg)   SpO2 96%   BMI 35.40 kg/m   Wt Readings from Last 3 Encounters:  03/24/23 261 lb (118.4  kg)  03/09/23 261 lb 12.8 oz (118.8 kg)  03/06/23 263 lb (119.3 kg)     Exam: General: Pt appears well and is in NAD  Lungs: Coarse breathing sounds bilaterally  Heart: RRR   Abdomen: soft, nontender  Extremities: Trace pretibial edema.  Stasis dermatitis over the shins  Neuro: MS is good with appropriate affect, pt is alert and Ox3     DM foot exam: 01/13/2023 per podiatry      DATA REVIEWED:  Lab Results  Component Value Date   HGBA1C 9.4 (A) 03/24/2023   HGBA1C 9.4 (A) 08/04/2022   HGBA1C 9.8 (A) 04/23/2022    Latest Reference Range & Units 02/04/23 08:41  Sodium 134 - 144 mmol/L 137  Potassium 3.5 - 5.2 mmol/L 4.4  Chloride 96 - 106 mmol/L 98  CO2 20 - 29 mmol/L 21  Glucose 70 - 99 mg/dL 093 (H)  BUN 6 - 24 mg/dL 14  Creatinine 2.35 - 5.73 mg/dL 2.20  Calcium 8.7 - 25.4 mg/dL 9.2  BUN/Creatinine Ratio 9 - 20  14  eGFR >59 mL/min/1.73 93  Alkaline Phosphatase 44 - 121 IU/L 74  Albumin 4.1 - 5.1 g/dL 4.4  Albumin/Globulin Ratio 1.2 - 2.2  1.7  AST 0 - 40 IU/L 25  ALT 0 - 44 IU/L 35  Total Protein 6.0 - 8.5 g/dL 7.0  Total Bilirubin 0.0 - 1.2 mg/dL 0.7  (H): Data is abnormally high   Latest Reference Range & Units 02/04/23 08:41  Cholesterol, Total 100 - 199 mg/dL 270  HDL Cholesterol >62 mg/dL 48  Total Non-HDL-Chol (LDL+VLDL) 0 - 129 mg/dL 82  Triglycerides 0 - 376 mg/dL 283  VLDL Cholesterol Cal 5 - 40 mg/dL 20  LDL Chol Calc (NIH) 0 - 99 mg/dL 62  Globulin, Total 1.5 - 4.5 g/dL 2.6   In office BG 151 mg/dL    ASSESSMENT / PLAN / RECOMMENDATIONS:   1) Type 2 Diabetes Mellitus, Poorly controlled, With Neuropathic  complications - Most recent A1c of 9.4%. Goal A1c < 7.0 %.   -A1c remains above goal  -He was not able to take Trulicity as it was once weekly injection and he would forget to take it -He refuses to take insulin basal/prandial -He had declined CeQur in the past -He is requesting to try Rybelsus as value is on it, patient prefer all  glycemic agents   MEDICATIONS:  Start Rybelsus 3 mg daily for 30 days, then increase to 7 mg daily Continue  Jardiance 25 mg daily   EDUCATION / INSTRUCTIONS: BG monitoring instructions: Patient is instructed to check his blood sugars 3 times a day, before meals  Call [facility] if: BG persistently < 70  I reviewed the Rule of 15 for the treatment of hypoglycemia in detail with the patient. Literature supplied.    2) Diabetic complications:  Eye: Does not have known diabetic retinopathy.  Neuro/ Feet: Does  have known diabetic peripheral neuropathy  Renal: Patient does not have known baseline CKD. He   is not on an ACEI/ARB at present.   3) Hypertriglyceridemia:  -Historically he has had elevated triglycerides up to 367 mg/DL in 7616, over the years this has trended down to normal levels  -LDL at 65 mg/DL -Patient on atorvastatin 20 mg daily   F/U in 6 months  Signed electronically by: Lyndle Herrlich, MD  [facility]  [facility] [address] [place] [place], [place] [address] Phone: [phone] FAX: [phone]   CC: Hoy Register, MD [address] [place] [address] [place] [address] Phone: [phone]  Fax: [phone]  Return to Endocrinology clinic as below: Future Appointments  Date Time Provider Department Center  03/26/2023  2:30 PM Little, Karma Lew, RN [facility] None  04/09/2023  2:00 PM Drucilla Chalet, RPH-CPP [facility] None  05/22/2023  1:00 PM Rennis Chris, MD [facility] None  05/29/2023  3:30 PM Bonnita Levan, RD [facility] NDM  06/16/2023  1:45 PM  Freddie Breech, DPM [facility]  08/05/2023  2:10 PM Hoy Register, MD [facility] None  02/16/2024  2:00 PM [facility] ANNUAL WELLNESS VISIT [facility] None

## 2023-03-25 ENCOUNTER — Other Ambulatory Visit: Payer: Self-pay

## 2023-03-26 ENCOUNTER — Ambulatory Visit: Payer: Self-pay

## 2023-03-26 NOTE — Patient Outreach (Addendum)
  Care Coordination   Initial Visit Note   03/26/2023 Name: Bradley [name] MRN: [id number] DOB: 1981-01-12  Bradley [name] is a 42 y.o. year old male who sees Hoy Register, MD for primary care. I spoke with  Bradley [name] by phone today.  What matters to the patients health and wellness today?  Patient will consider reusing a continuous glucose monitor in the future.     Goals Addressed             This Visit's Progress    Effective management of DM       Care Coordination Interventions: Evaluation of current treatment plan related to type 2 diabetes and patient's adherence to plan as established by provider Review of patient status, including review of consultant's reports, relevant laboratory and other test results, and medications completed Determined patient is not checking his sugars at home, he does not like needles Educated patient about continuous blood glucose monitoring, patient declines at this time Determined patient is working with a RD, he denies having questions related to this treatment plan at this time Mailed printed educational materials related to Diabetes Management Discussed plans with patient for ongoing care coordination follow up and patient is agreeable   Assessed patient's understanding of A1c goal: <7% Lab Results  Component Value Date   HGBA1C 9.4 (A) 03/24/2023     Interventions Today    Flowsheet Row Most Recent Value  Chronic Disease   Chronic disease during today's visit Diabetes  General Interventions   General Interventions Discussed/Reviewed General Interventions Reviewed, General Interventions Discussed, Doctor Visits, Durable Medical Equipment (DME)  Doctor Visits Discussed/Reviewed Doctor Visits Reviewed, Doctor Visits Discussed, Specialist, PCP  Durable Medical Equipment (DME) Glucomoter  Education Interventions   Education Provided Provided Education, Provided Printed Education  Provided Verbal Education On Blood Sugar  Monitoring, When to see the doctor          SDOH assessments and interventions completed:  No     Care Coordination Interventions:  Yes, provided   Follow up plan: Follow up call scheduled for 08/07/23 @1 :00 PM    Encounter Outcome:  Pt. Visit Completed

## 2023-03-26 NOTE — Patient Instructions (Signed)
Visit Information  Thank you for taking time to visit with me today. Please don't hesitate to contact me if I can be of assistance to you.   Following are the goals we discussed today:   Goals Addressed             This Visit's Progress    Effective management of DM       Care Coordination Interventions: Evaluation of current treatment plan related to type 2 diabetes and patient's adherence to plan as established by provider Review of patient status, including review of consultant's reports, relevant laboratory and other test results, and medications completed Determined patient is not checking his sugars at home, he does not like needles Educated patient about continuous blood glucose monitoring, patient declines at this time Determined patient is working with a RD, he denies having questions related to this treatment plan at this time Mailed printed educational materials related to Diabetes Management Discussed plans with patient for ongoing care coordination follow up and patient is agreeable   Assessed patient's understanding of A1c goal: <7% Lab Results  Component Value Date   HGBA1C 9.4 (A) 03/24/2023          Our next appointment is by telephone on 08/07/23 at 1:00 PM  Please call the care guide team at [phone] if you need to cancel or reschedule your appointment.   If you are experiencing a Mental Health or Behavioral Health Crisis or need someone to talk to, please call 1-800-273-TALK (toll free, 24 hour hotline)  Patient verbalizes understanding of instructions and care plan provided today and agrees to view in MyChart. Active MyChart status and patient understanding of how to access instructions and care plan via MyChart confirmed with patient.     Delsa Sale, RN, BSN, CCM Care Management Coordinator [facility] Care Management Direct Phone: [phone]

## 2023-04-06 DIAGNOSIS — F3132 Bipolar disorder, current episode depressed, moderate: Secondary | ICD-10-CM | POA: Diagnosis not present

## 2023-04-06 DIAGNOSIS — F4312 Post-traumatic stress disorder, chronic: Secondary | ICD-10-CM | POA: Diagnosis not present

## 2023-04-06 DIAGNOSIS — F411 Generalized anxiety disorder: Secondary | ICD-10-CM | POA: Diagnosis not present

## 2023-04-09 ENCOUNTER — Ambulatory Visit: Payer: Medicare HMO | Attending: Family Medicine | Admitting: Pharmacist

## 2023-04-09 DIAGNOSIS — I825Y2 Chronic embolism and thrombosis of unspecified deep veins of left proximal lower extremity: Secondary | ICD-10-CM | POA: Diagnosis not present

## 2023-04-09 LAB — POCT INR: POC INR: 3

## 2023-04-21 ENCOUNTER — Other Ambulatory Visit: Payer: Self-pay

## 2023-05-11 ENCOUNTER — Ambulatory Visit: Payer: Medicare HMO | Attending: Family Medicine | Admitting: Pharmacist

## 2023-05-11 DIAGNOSIS — I825Y2 Chronic embolism and thrombosis of unspecified deep veins of left proximal lower extremity: Secondary | ICD-10-CM

## 2023-05-11 LAB — POCT INR: POC INR: 2.7

## 2023-05-13 ENCOUNTER — Telehealth: Payer: Self-pay

## 2023-05-13 ENCOUNTER — Other Ambulatory Visit: Payer: Self-pay

## 2023-05-13 MED ORDER — GLIMEPIRIDE 2 MG PO TABS
2.0000 mg | ORAL_TABLET | Freq: Every day | ORAL | 3 refills | Status: DC
  Filled 2023-05-13: qty 90, 90d supply, fill #0
  Filled 2023-08-10: qty 90, 90d supply, fill #1

## 2023-05-13 NOTE — Telephone Encounter (Signed)
Patient states that he has stopped the Ryblesus do to having severe side effects. Patient having muscle weakness, diarrhea, and abdominal pain. Would you like to prescribe something else?

## 2023-05-13 NOTE — Telephone Encounter (Signed)
Patient advised and will pick up new medication

## 2023-05-19 ENCOUNTER — Other Ambulatory Visit: Payer: Self-pay

## 2023-05-21 NOTE — Progress Notes (Signed)
[facility] - Clinic Note  05/22/2023     CHIEF COMPLAINT Patient presents for Retina Follow Up    HISTORY OF PRESENT ILLNESS: Bradley [name] is a 42 y.o. male who presents to the clinic today for:   HPI     Retina Follow Up   In both eyes.  This started 12 months ago.  Duration of 12 months.  Since onset it is stable.  I, the attending physician,  performed the HPI with the patient and updated documentation appropriately.        Comments   12 month retina follow up diabetic exam pt is reporting no vision changes noticed he denies any flashes or floaters his last reading was 320 range 2 weeks ago A1C 9.4 1 month ago       Last edited by Rennis Chris, MD on 05/22/2023  3:48 PM.    Patient states that he has not noticed any issues with her vision.   Referring physician: Hoy Register, MD [address] [place],  [place] [address]  HISTORICAL INFORMATION:   Selected notes from the MEDICAL RECORD NUMBER    CURRENT MEDICATIONS: No current outpatient medications on file. (Ophthalmic Drugs)   No current facility-administered medications for this visit. (Ophthalmic Drugs)   Current Outpatient Medications (Other)  Medication Sig   ARIPiprazole (ABILIFY) 5 MG tablet Take 1 tablet (5 mg total) by mouth at bedtime.   atorvastatin (LIPITOR) 20 MG tablet Take 1 tablet (20 mg total) by mouth daily.   Blood Glucose Calibration (ACCU-CHEK ACTIVE GLUCOSE CONT VI) by In Vitro route.   doxepin (SINEQUAN) 50 MG capsule Take 1 capsule (50 mg total) by mouth as needed for sleep   DULoxetine (CYMBALTA) 30 MG capsule Take 30 mg by mouth daily.   DULoxetine (CYMBALTA) 60 MG capsule Take 1 capsule(s) by mouth at bedtime for anxiety/depression   empagliflozin (JARDIANCE) 25 MG TABS tablet Take 1 tablet (25 mg total) by mouth daily before breakfast.   glimepiride (AMARYL) 2 MG tablet Take 1 tablet (2 mg total) by mouth daily before breakfast.   glucose  blood (ACCU-CHEK GUIDE) test strip 1 each by Other route 3 (three) times daily. Use as instructed   OneTouch Delica Lancets 33G MISC 1 Device by Does not apply route 3 (three) times daily.   sildenafil (VIAGRA) 50 MG tablet Take 1 tablet (50 mg total) by mouth daily as needed for erectile dysfunction. At least 24 hours between doses   traMADol (ULTRAM) 50 MG tablet Take 1 tablet (50 mg total) by mouth every 12 (twelve) hours as needed.   warfarin (COUMADIN) 2.5 MG tablet TAKE 1 TABLET BY MOUTH DAILY AS DIRECTED BY THE COUMADIN CLINIC.   gentamicin cream (GARAMYCIN) 0.1 % Apply to wound left lower extremity once daily until healed. (Patient not taking: Reported on 05/22/2023)   mupirocin ointment (BACTROBAN) 2 % Apply to right great toe once daily. (Patient not taking: Reported on 05/22/2023)   No current facility-administered medications for this visit. (Other)   REVIEW OF SYSTEMS: ROS   Positive for: Endocrine, Eyes, Respiratory, Psychiatric Negative for: Constitutional, Gastrointestinal, Neurological, Skin, Genitourinary, Musculoskeletal, HENT, Cardiovascular, Allergic/Imm, Heme/Lymph Last edited by Etheleen Mayhew, COT on 05/22/2023 12:59 PM.      ALLERGIES Allergies  Allergen Reactions   Actos [Pioglitazone] Other (See Comments)    Stomach cramps   Gabapentin Other (See Comments)    Crying spells   Lyrica [Pregabalin] Other (See Comments)  Makes the patient somnolent   PAST MEDICAL HISTORY Past Medical History:  Diagnosis Date   ADHD    ADHD (attention deficit hyperactivity disorder)    Bipolar 1 disorder (HCC)    Bipolar disorder (HCC)    Depression    Diabetes mellitus without complication (HCC)    Hyperlipidemia    Morbid obesity (HCC)    Obesity    Panic attack    Varicose veins of both lower extremities with pain    Past Surgical History:  Procedure Laterality Date   surgery on meatus as a child     FAMILY HISTORY Family History  Problem Relation Age of  Onset   Rheum arthritis Mother    Diabetes Mother    Heart failure Mother    Stroke Mother    ADD / ADHD Mother    Anxiety disorder Mother    Arthritis Mother    Depression Mother    Drug abuse Mother    Alcohol abuse Father    Glaucoma Maternal Grandmother    COPD Maternal Grandmother    Lung cancer Paternal Grandmother        smoker   SOCIAL HISTORY Social History   Tobacco Use   Smoking status: Every Day    Current packs/day: 1.00    Average packs/day: 1 pack/day for 15.0 years (15.0 ttl pk-yrs)    Types: Cigarettes   Smokeless tobacco: Never  Vaping Use   Vaping status: Never Used  Substance Use Topics   Alcohol use: Yes    Alcohol/week: 1.0 standard drink of alcohol    Types: 1 Shots of liquor per week    Comment: Only on my birthday and New Year's   Drug use: No       OPHTHALMIC EXAM:  Base Eye Exam     Visual Acuity (Snellen - Linear)       Right Left   Dist [place] 20/30 -1 20/60 -2   Dist ph [place] 20/25 20/40 +1         Tonometry (Tonopen, 1:05 PM)       Right Left   Pressure 18 19         Pupils       Pupils Dark Light Shape React APD   Right PERRL 5 4 Round Brisk None   Left PERRL 5 4 Round Brisk None         Visual Fields       Left Right    Full Full         Extraocular Movement       Right Left    Full, Ortho Full, Ortho         Neuro/Psych     Oriented x3: Yes   Mood/Affect: Normal         Dilation     Both eyes: 2.5% Phenylephrine @ 1:05 PM           Slit Lamp and Fundus Exam     Slit Lamp Exam       Right Left   Lids/Lashes Dermatochalasis - upper lid Dermatochalasis - upper lid   Conjunctiva/Sclera White and quiet White and quiet   Cornea 1-2+ inferior Punctate epithelial erosions, mild tear film debris 1-2+ inferior Punctate epithelial erosions, mild tear film debris   Anterior Chamber Deep and quiet Deep and quiet   Iris Round and dilated, No NVI Round and dilated, No NVI   Lens Clear Clear    Anterior Vitreous mild syneresis mild syneresis  Fundus Exam       Right Left   Disc Pink and Sharp Pink and Sharp, mild tilt, mild PPA, Compact   C/D Ratio 0.2 0.2   Macula Flat, Good foveal reflex, No heme or edema Flat, Good foveal reflex, mild Retinal pigment epithelial mottling, No heme or edema   Vessels Tortuous, Vascular attenuation, AV crossing changes, small flame off IN arteriole attenuated, Tortuous   Periphery Attached, rare MA Attached, focal punctate IRH just outside ST arcades            IMAGING AND PROCEDURES  Imaging and Procedures for @TODAY @  OCT, Retina - OU - Both Eyes       Right Eye Quality was good. Central Foveal Thickness: 259. Progression has been stable. Findings include normal foveal contour, no IRF, no SRF.   Left Eye Quality was good. Central Foveal Thickness: 269. Progression has been stable. Findings include normal foveal contour, no IRF, no SRF, vitreomacular adhesion .   Notes *Images captured and stored on drive  Diagnosis / Impression:  NFP, no IRF/SRF OU No DME OU  Clinical management:  See below  Abbreviations: NFP - Normal foveal profile. CME - cystoid macular edema. PED - pigment epithelial detachment. IRF - intraretinal fluid. SRF - subretinal fluid. EZ - ellipsoid zone. ERM - epiretinal membrane. ORA - outer retinal atrophy. ORT - outer retinal tubulation. SRHM - subretinal hyper-reflective material            ASSESSMENT/PLAN:    ICD-10-CM   1. Mild nonproliferative diabetic retinopathy of both eyes without macular edema associated with type 2 diabetes mellitus (HCC)  E11.3293 OCT, Retina - OU - Both Eyes    2. Long term (current) use of oral hypoglycemic drugs  Z79.84     3. Essential hypertension  I10     4. Hypertensive retinopathy of both eyes  H35.033      1,2. Mild nonproliferative diabetic retinopathy OU  - HbA1c 9.4 (06.24.24), 9.8 (07.26.23), 12.7 (07.18.22) - The incidence, risk factors for  progression, natural history and treatment options for diabetic retinopathy were discussed with patient.   - The need for close monitoring of blood glucose, blood pressure, and serum lipids, avoiding cigarette or any type of tobacco, and the need for long term follow up was also discussed with patient. - exam today with focal punctate hemorrhages and microaneurysms  - OCT without diabetic macular edema, both eyes   - f/u in 1 year, sooner prn, DFE, OCT  3,4. Hypertensive retinopathy OU  - discussed importance of tight BP control  - history of PE and DVT -- on coumadin  - monitor   Ophthalmic Meds Ordered this visit:  No orders of the defined types were placed in this encounter.    Return in about 1 year (around 05/21/2024) for f/u Diabetes , DFE, OCT.  There are no Patient Instructions on file for this visit.  This document serves as a record of services personally performed by Karie Chimera, MD, PhD. It was created on their behalf by Glee Arvin. Manson Passey, OA an ophthalmic technician. The creation of this record is the provider's dictation and/or activities during the visit.    Electronically signed by: Glee Arvin. Manson Passey, OA 05/22/23 3:52 PM  This document serves as a record of services personally performed by Karie Chimera, MD, PhD. It was created on their behalf by Laurey Morale, COT an ophthalmic technician. The creation of this record is the provider's dictation and/or activities during the  visit.    Electronically signed by:  Charlette Caffey, COT  05/22/23 3:52 PM  Karie Chimera, M.D., Ph.D. Diseases & Surgery of the Retina and Vitreous [facility]  I have reviewed the above documentation for accuracy and completeness, and I agree with the above. Karie Chimera, M.D., Ph.D. 05/22/23 3:52 PM  Abbreviations: M myopia (nearsighted); A astigmatism; H hyperopia (farsighted); P presbyopia; Mrx spectacle prescription;  CTL contact lenses; OD right eye; OS  left eye; OU both eyes  XT exotropia; ET esotropia; PEK punctate epithelial keratitis; PEE punctate epithelial erosions; DES dry eye syndrome; MGD meibomian gland dysfunction; ATs artificial tears; PFAT's preservative free artificial tears; NSC nuclear sclerotic cataract; PSC posterior subcapsular cataract; ERM epi-retinal membrane; PVD posterior vitreous detachment; RD retinal detachment; DM diabetes mellitus; DR diabetic retinopathy; NPDR non-proliferative diabetic retinopathy; PDR proliferative diabetic retinopathy; CSME clinically significant macular edema; DME diabetic macular edema; dbh dot blot hemorrhages; CWS cotton wool spot; POAG primary open angle glaucoma; C/D cup-to-disc ratio; HVF humphrey visual field; GVF goldmann visual field; OCT optical coherence tomography; IOP intraocular pressure; BRVO Branch retinal vein occlusion; CRVO central retinal vein occlusion; CRAO central retinal artery occlusion; BRAO branch retinal artery occlusion; RT retinal tear; SB scleral buckle; PPV pars plana vitrectomy; VH Vitreous hemorrhage; PRP panretinal laser photocoagulation; IVK intravitreal kenalog; VMT vitreomacular traction; MH Macular hole;  NVD neovascularization of the disc; NVE neovascularization elsewhere; AREDS age related eye disease study; ARMD age related macular degeneration; POAG primary open angle glaucoma; EBMD epithelial/anterior basement membrane dystrophy; ACIOL anterior chamber intraocular lens; IOL intraocular lens; PCIOL posterior chamber intraocular lens; Phaco/IOL phacoemulsification with intraocular lens placement; PRK photorefractive keratectomy; LASIK laser assisted in situ keratomileusis; HTN hypertension; DM diabetes mellitus; COPD chronic obstructive pulmonary disease

## 2023-05-22 ENCOUNTER — Ambulatory Visit (INDEPENDENT_AMBULATORY_CARE_PROVIDER_SITE_OTHER): Payer: Medicare HMO | Admitting: Ophthalmology

## 2023-05-22 ENCOUNTER — Encounter (INDEPENDENT_AMBULATORY_CARE_PROVIDER_SITE_OTHER): Payer: Self-pay | Admitting: Ophthalmology

## 2023-05-22 DIAGNOSIS — Z7984 Long term (current) use of oral hypoglycemic drugs: Secondary | ICD-10-CM

## 2023-05-22 DIAGNOSIS — H35033 Hypertensive retinopathy, bilateral: Secondary | ICD-10-CM | POA: Diagnosis not present

## 2023-05-22 DIAGNOSIS — E113293 Type 2 diabetes mellitus with mild nonproliferative diabetic retinopathy without macular edema, bilateral: Secondary | ICD-10-CM

## 2023-05-22 DIAGNOSIS — E119 Type 2 diabetes mellitus without complications: Secondary | ICD-10-CM

## 2023-05-22 DIAGNOSIS — I10 Essential (primary) hypertension: Secondary | ICD-10-CM | POA: Diagnosis not present

## 2023-05-22 LAB — HM DIABETES EYE EXAM

## 2023-05-29 ENCOUNTER — Encounter: Payer: Medicare HMO | Attending: Internal Medicine | Admitting: Dietician

## 2023-05-29 ENCOUNTER — Encounter: Payer: Self-pay | Admitting: Dietician

## 2023-05-29 DIAGNOSIS — Z794 Long term (current) use of insulin: Secondary | ICD-10-CM | POA: Insufficient documentation

## 2023-05-29 DIAGNOSIS — E1165 Type 2 diabetes mellitus with hyperglycemia: Secondary | ICD-10-CM | POA: Diagnosis not present

## 2023-05-29 NOTE — Patient Instructions (Addendum)
Purchase the less nicotine vapes and aim to stop smoking and vaping.  Both are harmful.  Patient set goals:  Reduce cigarettes by 1 every week and don't increase vaping Take your dog to the park 3 times a week to walk.   Start with 10 minute walks and increase 5 minutes every week.  Self motivate - "I will play my video game after the walk."

## 2023-05-29 NOTE — Progress Notes (Unsigned)
Diabetes Self-Management Education  Visit Type: Follow-up  Appt. Start Time: 1545 Appt. End Time: 1615  06/01/2023  [name], identified by name and date of birth, is a 42 y.o. male with a diagnosis of Diabetes:  .   ASSESSMENT Patient is here today with his girlfriend.  He was last seen in this office 03/06/2023.  He has gained 10 lbs in the past 2 weeks.   He tolerated Rybelsus, tolerated this at a lower level but not when dose was increased.  He was started on glimepiride.  He continues the [place].  He wants to give all oral meds a try before restarting injectable medications. He went to his eye doctor and states that he has small bleeds in his eyes and they will monitor. He is not checking his blood glucose. He is not exercising. He is vaping and smoking.  History includes Type 2 Diabetes (2019), DVT, Bipolar, ADHD, neuropathy, smoking, memory issues, polycythemia, thrombocytopenia, history of DVT/PE, nonproliferative retinopathy Labs noted to include:  A1C  9.4% 03/24/2023 and 08/04/2022, 9.8% 04/23/2022 decreased from 12.7% 08/16/2021,  cholesterol 193, Triglycerides 365, HDL 40, LDL 92 08/09/2020 Medications include:Trulicity(stopped), glipizide (discontinued), actos (discontinued), Jardiance, Tresiba 70 units q HS, Novolog before meals (not taking), coumadin, Abilify and other medications that contribute to weight gain. He stopped pioglitizone after 3 days due to stomach cramps. He could not tolerated Metformin due to bowel incontinence.    Sleep:  Eats late (10pm - midnight).  Goes to sleep late.  Wakes about 10:30 am (although later now) Support:  [facility], Bradley [name], patient of Dr. Lonzo [name] at [facility], PCP Bradley Register, MD   Weight: 269 lbs 05/29/2023 263 lbs 03/06/2023 262 lbs 12/08/2022 260 lbs 09/08/2022 261 lbs 07/21/2022 263 lbs 04/21/2022 278 lbs 12/19/2021 279 lbs 10/24/2021 282 lbs 08/16/2021 loss likely  due to uncontrolled blood glucose 295 lbs 02/14/21 306 lbs 47/2022 306 lbs 11/23/2020 425 lbs highest adult weight 2 years ago (loss presumably due to uncontrolled blood sugar) Lowest adult weight 235 lbs about 8 years ago and he does not recall circumstances of increased weight gain.   Patient lives with his girlfriend.  She has type 2 diabetes.  He does the shopping and cooking.  Dislikes most vegetables. Drinking mostly sugar free options. He is on disability.  Finances are a concern. His mother passed away 11-27-20 from complications of diabetes. Girlfriend  goes to the gym 2-3 times per week [facility]).  Patient is not motivated to go. Complains of neuropathy pain which worsens with exercise.    Diabetes Self-Management Education - 06/01/23 1000       Visit Information   Visit Type Follow-up      Psychosocial Assessment   Patient Belief/Attitude about Diabetes Other (comment)    What is the hardest part about your diabetes right now, causing you the most concern, or is the most worrisome to you about your diabetes?   Taking/obtaining medications;Being active;Checking blood sugar;Making healty food and beverage choices    Self-care barriers Lack of material resources    Self-management support Doctor's office;CDE visits    Other persons present Patient;Spouse/SO    Patient Concerns Nutrition/Meal planning;Monitoring;Glycemic Control;Weight Control    Special Needs None    Preferred Learning Style No preference indicated    Learning Readiness Contemplating      Pre-Education Assessment   Patient understands the diabetes disease and treatment process. Comprehends key points    Patient understands incorporating nutritional management into lifestyle.  Needs Review    Patient undertands incorporating physical activity into lifestyle. Needs Review    Patient understands using medications safely. Needs Review    Patient understands monitoring blood glucose, interpreting and using results  Needs Review    Patient understands prevention, detection, and treatment of acute complications. Comprehends key points    Patient understands prevention, detection, and treatment of chronic complications. Needs Review    Patient understands how to develop strategies to address psychosocial issues. Needs Review    Patient understands how to develop strategies to promote health/change behavior. Needs Review      Subsequent Visit   Since your last visit have you experienced any weight changes? Gain    Weight Loss (lbs) 6             Individualized Plan for Diabetes Self-Management Training:   Learning Objective:  Patient will have a greater understanding of diabetes self-management. Patient education plan is to attend individual and/or group sessions per assessed needs and concerns.   Plan:   Patient Instructions  Purchase the less nicotine vapes and aim to stop smoking and vaping.  Both are harmful.  Patient set goals:  Reduce cigarettes by 1 every week and don't increase vaping Take your dog to the park 3 times a week to walk.   Start with 10 minute walks and increase 5 minutes every week.  Self motivate - "I will play my video game after the walk."   Expected Outcomes:     Education material provided:   If problems or questions, patient to contact team via:  Phone  Future DSME appointment:

## 2023-06-02 ENCOUNTER — Other Ambulatory Visit: Payer: Self-pay | Admitting: Internal Medicine

## 2023-06-02 DIAGNOSIS — E1165 Type 2 diabetes mellitus with hyperglycemia: Secondary | ICD-10-CM

## 2023-06-10 ENCOUNTER — Other Ambulatory Visit: Payer: Self-pay

## 2023-06-10 ENCOUNTER — Other Ambulatory Visit: Payer: Self-pay | Admitting: Family Medicine

## 2023-06-10 MED ORDER — WARFARIN SODIUM 2.5 MG PO TABS
2.5000 mg | ORAL_TABLET | Freq: Every day | ORAL | 1 refills | Status: DC
  Filled 2023-06-10: qty 90, 90d supply, fill #0
  Filled 2023-09-07: qty 90, 90d supply, fill #1

## 2023-06-11 ENCOUNTER — Other Ambulatory Visit: Payer: Self-pay

## 2023-06-12 ENCOUNTER — Other Ambulatory Visit: Payer: Self-pay | Admitting: Family Medicine

## 2023-06-12 DIAGNOSIS — E1149 Type 2 diabetes mellitus with other diabetic neurological complication: Secondary | ICD-10-CM

## 2023-06-15 ENCOUNTER — Ambulatory Visit: Payer: Medicare HMO | Attending: Family Medicine | Admitting: Pharmacist

## 2023-06-15 DIAGNOSIS — I825Y2 Chronic embolism and thrombosis of unspecified deep veins of left proximal lower extremity: Secondary | ICD-10-CM

## 2023-06-15 LAB — POCT INR: POC INR: 2.6

## 2023-06-15 MED ORDER — TRAMADOL HCL 50 MG PO TABS
50.0000 mg | ORAL_TABLET | Freq: Two times a day (BID) | ORAL | 2 refills | Status: DC | PRN
Start: 2023-06-15 — End: 2023-09-03
  Filled 2023-06-15: qty 60, 30d supply, fill #0
  Filled 2023-07-13 – 2023-07-14 (×4): qty 60, 30d supply, fill #1
  Filled 2023-08-10 – 2023-08-11 (×2): qty 60, 30d supply, fill #2

## 2023-06-16 ENCOUNTER — Other Ambulatory Visit: Payer: Self-pay

## 2023-06-16 ENCOUNTER — Ambulatory Visit (INDEPENDENT_AMBULATORY_CARE_PROVIDER_SITE_OTHER): Payer: Medicare HMO | Admitting: Podiatry

## 2023-06-16 ENCOUNTER — Encounter: Payer: Self-pay | Admitting: Podiatry

## 2023-06-16 DIAGNOSIS — L84 Corns and callosities: Secondary | ICD-10-CM | POA: Diagnosis not present

## 2023-06-16 DIAGNOSIS — B351 Tinea unguium: Secondary | ICD-10-CM

## 2023-06-16 DIAGNOSIS — M79675 Pain in left toe(s): Secondary | ICD-10-CM

## 2023-06-16 DIAGNOSIS — E1142 Type 2 diabetes mellitus with diabetic polyneuropathy: Secondary | ICD-10-CM | POA: Diagnosis not present

## 2023-06-16 DIAGNOSIS — M79674 Pain in right toe(s): Secondary | ICD-10-CM

## 2023-06-21 NOTE — Progress Notes (Signed)
Subjective:  [name] ID: Bradley [name], male    DOB: [birth year],  MRN: [id number]  42 y.o. male presents to clinic with  at risk foot care with history of diabetic neuropathy and callus(es) right foot and painful thick toenails that are difficult to trim. Painful toenails interfere with ambulation. Aggravating factors include wearing enclosed shoe gear. Pain is relieved with periodic professional debridement. Painful calluses are aggravated when weightbearing with and without shoegear. Pain is relieved with periodic professional debridement.. [name] states his left 3rd toenail may have broken off. States he continues to walk barefoot. Chief Complaint  [name] presents with   Diabetes    DFC BS - 274 A1C - 9.4    New problem(s): None   PCP is Hoy Register, MD.  Allergies  Allergen Reactions   Actos [Pioglitazone] Other (See Comments)    Stomach cramps   Gabapentin Other (See Comments)    Crying spells   Lyrica [Pregabalin] Other (See Comments)    Makes the [name] somnolent    Review of Systems: Negative except as noted in the HPI.   Objective:  [name] is a pleasant 42 y.o. male obese in NAD.Marland Kitchen  Vascular Examination: Vascular status intact b/l with palpable pedal pulses. CFT <3 seconds b/l. No edema. No pain with calf compression b/l. Skin temperature gradient WNL b/l. Varicosities present b/l. Evidence of chronic venous insufficiency b/l LE.  Neurological Examination: Sensation grossly intact b/l with 10 gram monofilament. Vibratory sensation intact b/l. Pt has subjective symptoms of neuropathy.  Dermatological Examination: Plantar aspect of both feet soiled. No evidence of FBO.           Pedal skin with normal turgor, texture and tone b/l. Toenails 1-5 b/l thick, discolored, elongated with subungual debris and pain on dorsal palpation.  Hyperkeratotic lesion(s) submet head 5 right foot.  No erythema, no edema, no drainage, no fluctuance. Dried heme  noted under right 3rd toenail with no underlying wound. Left 3rd toe with evidence of broken nail plate.  Musculoskeletal Examination: Muscle strength 5/5 to b/l LE. No pain, crepitus or joint limitation noted with ROM bilateral LE. No gross bony deformities bilaterally.  Radiographs: None  Last A1c:      Latest Ref Rng & Units 03/24/2023    3:04 PM 08/04/2022    2:10 PM  Hemoglobin A1C  Hemoglobin-A1c 4.0 - 5.6 % 9.4  9.4    Assessment:   1. Pain due to onychomycosis of toenails of both feet   2. Callus   3. Diabetic peripheral neuropathy associated with type 2 diabetes mellitus (HCC)    Plan:  -Consent given for treatment as described below: -Examined [name]. -[name] continues to be noncompliant and walk barefoot. He understands risks of not wearing shoe gear. -Mycotic toenails 1-5 bilaterally were debrided in length and girth with sterile nail nippers and dremel without incident. -Callus(es) submet head 5 right foot pared utilizing sterile scalpel blade without complication or incident. Total number debrided =1. -[name]/POA to call should there be question/concern in the interim.  Return in about 3 months (around 09/15/2023).  Freddie Breech, DPM

## 2023-07-01 ENCOUNTER — Other Ambulatory Visit: Payer: Self-pay

## 2023-07-09 DIAGNOSIS — F3132 Bipolar disorder, current episode depressed, moderate: Secondary | ICD-10-CM | POA: Diagnosis not present

## 2023-07-09 DIAGNOSIS — F411 Generalized anxiety disorder: Secondary | ICD-10-CM | POA: Diagnosis not present

## 2023-07-09 DIAGNOSIS — F4312 Post-traumatic stress disorder, chronic: Secondary | ICD-10-CM | POA: Diagnosis not present

## 2023-07-13 ENCOUNTER — Other Ambulatory Visit: Payer: Self-pay

## 2023-07-14 ENCOUNTER — Other Ambulatory Visit: Payer: Self-pay

## 2023-07-17 ENCOUNTER — Ambulatory Visit: Payer: Medicare HMO | Admitting: Pharmacist

## 2023-08-04 ENCOUNTER — Ambulatory Visit: Payer: Medicare HMO | Attending: Family Medicine | Admitting: Pharmacist

## 2023-08-04 DIAGNOSIS — Z7901 Long term (current) use of anticoagulants: Secondary | ICD-10-CM

## 2023-08-04 DIAGNOSIS — I825Y2 Chronic embolism and thrombosis of unspecified deep veins of left proximal lower extremity: Secondary | ICD-10-CM | POA: Diagnosis not present

## 2023-08-04 LAB — POCT INR: POC INR: 3

## 2023-08-05 ENCOUNTER — Encounter: Payer: Self-pay | Admitting: Family Medicine

## 2023-08-05 ENCOUNTER — Ambulatory Visit: Payer: Medicare HMO | Attending: Family Medicine | Admitting: Family Medicine

## 2023-08-05 VITALS — BP 103/69 | HR 95 | Ht 72.0 in | Wt 287.2 lb

## 2023-08-05 DIAGNOSIS — Z7984 Long term (current) use of oral hypoglycemic drugs: Secondary | ICD-10-CM | POA: Diagnosis not present

## 2023-08-05 DIAGNOSIS — E1149 Type 2 diabetes mellitus with other diabetic neurological complication: Secondary | ICD-10-CM

## 2023-08-05 DIAGNOSIS — G8929 Other chronic pain: Secondary | ICD-10-CM

## 2023-08-05 DIAGNOSIS — I825Y2 Chronic embolism and thrombosis of unspecified deep veins of left proximal lower extremity: Secondary | ICD-10-CM | POA: Diagnosis not present

## 2023-08-05 DIAGNOSIS — E781 Pure hyperglyceridemia: Secondary | ICD-10-CM

## 2023-08-05 DIAGNOSIS — F17290 Nicotine dependence, other tobacco product, uncomplicated: Secondary | ICD-10-CM | POA: Diagnosis not present

## 2023-08-05 DIAGNOSIS — F3162 Bipolar disorder, current episode mixed, moderate: Secondary | ICD-10-CM | POA: Diagnosis not present

## 2023-08-05 NOTE — Progress Notes (Signed)
Subjective:  Patient ID: Bradley [name], male    DOB: 1981-06-23  Age: 42 y.o. MRN: [id number]  CC: Medical Management of Chronic Issues   HPI BOHDEN DUNG is a 42 y.o. year old male with a history of morbid obesity, type 2 diabetes mellitus (A1c 9.4 managed by endocrine, non compliance,diabetic neuropathy (unable to tolerate gabapentin, Lyrica), Bipolar disorder (he goes to [facility]) , previous DVT in 2017, PE (diagnosed in 10/2018 - on chronic anticoagulation with Coumadin).   Interval History: Discussed the use of AI scribe software for clinical note transcription with the patient, who gave verbal consent to proceed.  He presents for a routine follow-up. His last A1c was 9.4. He is scheduled to see his endocrinologist in January.  He has been experiencing neuropathy, which he rates as a 4 out of 10 most days. He manages this with tramadol, which he took this morning.  Denies use of illicit substances or alcohol consumption.   He also reports a significant weight gain of 20 pounds over the past three months, which he attributes to his medication, glimepiride. He has not made any changes to his diet or exercise routine, which currently includes minimal physical activity. He also reports smoking, but has no plans to quit at this time due to current stressors.   He is also under the care of a neuropsychiatrist for his bipolar disorder, which he reports is well-managed with medication.   For his chronic DVT he remains on anticoagulation with warfarin and sees the clinical pharmacist regularly.    Past Medical History:  Diagnosis Date   ADHD    ADHD (attention deficit hyperactivity disorder)    Bipolar 1 disorder (HCC)    Bipolar disorder (HCC)    Depression    Diabetes mellitus without complication (HCC)    Hyperlipidemia    Morbid obesity (HCC)    Obesity    Panic attack    Varicose veins of both lower extremities with pain     Past Surgical History:   Procedure Laterality Date   surgery on meatus as a child      Family History  Problem Relation Age of Onset   Rheum arthritis Mother    Diabetes Mother    Heart failure Mother    Stroke Mother    ADD / ADHD Mother    Anxiety disorder Mother    Arthritis Mother    Depression Mother    Drug abuse Mother    Alcohol abuse Father    Glaucoma Maternal Grandmother    COPD Maternal Grandmother    Lung cancer Paternal Grandmother        smoker    Social History   Socioeconomic History   Marital status: Single    Spouse name: Not on file   Number of children: Not on file   Years of education: Not on file   Highest education level: Some college, no degree  Occupational History   Not on file  Tobacco Use   Smoking status: Every Day    Current packs/day: 1.00    Average packs/day: 1 pack/day for 15.0 years (15.0 ttl pk-yrs)    Types: Cigarettes   Smokeless tobacco: Never  Vaping Use   Vaping status: Never Used  Substance and Sexual Activity   Alcohol use: Yes    Alcohol/week: 1.0 standard drink of alcohol    Types: 1 Shots of liquor per week    Comment: Only on my birthday and New Year's  Drug use: No   Sexual activity: Not Currently    Birth control/protection: Abstinence  Other Topics Concern   Not on file  Social History Narrative   ** Merged History Encounter **       Social Determinants of Health   Financial Resource Strain: Low Risk  (08/04/2023)   Overall Financial Resource Strain (CARDIA)    Difficulty of Paying Living Expenses: Not hard at all  Food Insecurity: No Food Insecurity (08/04/2023)   Hunger Vital Sign    Worried About Running Out of Food in the Last Year: Never true    Ran Out of Food in the Last Year: Never true  Transportation Needs: No Transportation Needs (08/04/2023)   PRAPARE - Administrator, Civil Service (Medical): No    Lack of Transportation (Non-Medical): No  Physical Activity: Inactive (08/04/2023)   Exercise Vital Sign     Days of Exercise per Week: 0 days    Minutes of Exercise per Session: 0 min  Stress: No Stress Concern Present (08/04/2023)   [employer] of Occupational Health - Occupational Stress Questionnaire    Feeling of Stress : Only a little  Social Connections: Socially Isolated (08/04/2023)   Social Connection and Isolation Panel [NHANES]    Frequency of Communication with Friends and Family: More than three times a week    Frequency of Social Gatherings with Friends and Family: Once a week    Attends Religious Services: Never    Database administrator or Organizations: No    Attends Engineer, structural: Never    Marital Status: Never married    Allergies  Allergen Reactions   Actos [Pioglitazone] Other (See Comments)    Stomach cramps   Gabapentin Other (See Comments)    Crying spells   Lyrica [Pregabalin] Other (See Comments)    Makes the patient somnolent    Outpatient Medications Prior to Visit  Medication Sig Dispense Refill   ARIPiprazole (ABILIFY) 5 MG tablet Take 1 tablet (5 mg total) by mouth at bedtime. 30 tablet 2   atorvastatin (LIPITOR) 20 MG tablet Take 1 tablet (20 mg total) by mouth daily. 90 tablet 3   Blood Glucose Calibration (ACCU-CHEK ACTIVE GLUCOSE CONT VI) by In Vitro route.     doxepin (SINEQUAN) 50 MG capsule Take 1 capsule (50 mg total) by mouth as needed for sleep 30 capsule 2   DULoxetine (CYMBALTA) 30 MG capsule Take 30 mg by mouth daily.     DULoxetine (CYMBALTA) 60 MG capsule Take 1 capsule(s) by mouth at bedtime for anxiety/depression 30 capsule 2   empagliflozin (JARDIANCE) 25 MG TABS tablet Take 1 tablet (25 mg total) by mouth daily before breakfast. 90 tablet 3   gentamicin cream (GARAMYCIN) 0.1 % Apply to wound left lower extremity once daily until healed. 30 g 1   glimepiride (AMARYL) 2 MG tablet Take 1 tablet (2 mg total) by mouth daily before breakfast. 90 tablet 3   glucose blood (ACCU-CHEK GUIDE) test strip 1 each by Other route  3 (three) times daily. Use as instructed 300 each 3   mupirocin ointment (BACTROBAN) 2 % Apply to right great toe once daily. 30 g 1   OneTouch Delica Lancets 33G MISC 1 Device by Does not apply route 3 (three) times daily. 300 each 3   sildenafil (VIAGRA) 50 MG tablet Take 1 tablet (50 mg total) by mouth daily as needed for erectile dysfunction. At least 24 hours between doses 10 tablet 1  traMADol (ULTRAM) 50 MG tablet Take 1 tablet (50 mg total) by mouth every 12 (twelve) hours as needed. 60 tablet 2   warfarin (COUMADIN) 2.5 MG tablet TAKE 1 TABLET BY MOUTH DAILY AS DIRECTED BY THE COUMADIN CLINIC. 90 tablet 1   No facility-administered medications prior to visit.     ROS Review of Systems  Constitutional:  Negative for activity change and appetite change.  HENT:  Negative for sinus pressure and sore throat.   Respiratory:  Negative for chest tightness, shortness of breath and wheezing.   Cardiovascular:  Negative for chest pain and palpitations.  Gastrointestinal:  Negative for abdominal distention, abdominal pain and constipation.  Genitourinary: Negative.   Musculoskeletal: Negative.   Psychiatric/Behavioral:  Negative for behavioral problems and dysphoric mood.     Objective:  BP 103/69   Pulse 95   Ht 6' (1.829 m)   Wt 287 lb 3.2 oz (130.3 kg)   SpO2 99%   BMI 38.95 kg/m      08/05/2023    2:25 PM 03/24/2023    2:56 PM 03/09/2023    2:21 PM  BP/Weight  Systolic BP 103 122 101  Diastolic BP 69 70 68  Wt. (Lbs) 287.2 261 261.8  BMI 38.95 kg/m2 35.4 kg/m2 35.51 kg/m2      Physical Exam Constitutional:      Appearance: He is well-developed. He is obese.  Cardiovascular:     Rate and Rhythm: Normal rate.     Heart sounds: Normal heart sounds. No murmur heard. Pulmonary:     Effort: Pulmonary effort is normal.     Breath sounds: Normal breath sounds. No wheezing or rales.  Chest:     Chest wall: No tenderness.  Abdominal:     General: Bowel sounds are  normal. There is no distension.     Palpations: Abdomen is soft. There is no mass.     Tenderness: There is no abdominal tenderness.  Musculoskeletal:        General: Normal range of motion.     Right lower leg: No edema.     Left lower leg: No edema.  Neurological:     Mental Status: He is alert and oriented to person, place, and time.  Psychiatric:        Mood and Affect: Mood normal.        Latest Ref Rng & Units 02/04/2023    8:41 AM 02/03/2023    2:31 PM 09/24/2022   12:49 PM  CMP  Glucose 70 - 99 mg/dL 409  CANCELED  811   BUN 6 - 24 mg/dL 14  CANCELED  16   Creatinine 0.76 - 1.27 mg/dL 9.14  CANCELED  7.82   Sodium 134 - 144 mmol/L 137  CANCELED  138   Potassium 3.5 - 5.2 mmol/L 4.4  CANCELED  4.5   Chloride 96 - 106 mmol/L 98  CANCELED  103   CO2 20 - 29 mmol/L 21  CANCELED  29   Calcium 8.7 - 10.2 mg/dL 9.2  CANCELED  9.8   Total Protein 6.0 - 8.5 g/dL 7.0  CANCELED  7.2   Total Bilirubin 0.0 - 1.2 mg/dL 0.7  CANCELED  0.8   Alkaline Phos 44 - 121 IU/L 74  CANCELED  62   AST 0 - 40 IU/L 25  CANCELED  20   ALT 0 - 44 IU/L 35  CANCELED  24     Lipid Panel     Component Value Date/Time  CHOL 130 02/04/2023 0841   TRIG 110 02/04/2023 0841   HDL 48 02/04/2023 0841   CHOLHDL 3.9 12/06/2020 1456   CHOLHDL 4.0 11/12/2016 1613   VLDL 28 11/12/2016 1613   LDLCALC 62 02/04/2023 0841    CBC    Component Value Date/Time   WBC 9.2 09/24/2022 1249   WBC 9.5 04/19/2020 2357   RBC 6.59 (H) 09/24/2022 1249   HGB 19.0 (H) 09/24/2022 1249   HGB 18.9 (H) 12/06/2020 1456   HCT 55.0 (H) 09/24/2022 1249   HCT 57.0 (H) 12/06/2020 1456   PLT 98 (L) 09/24/2022 1249   PLT 120 (L) 12/06/2020 1456   MCV 83.5 09/24/2022 1249   MCV 84 12/06/2020 1456   MCH 28.8 09/24/2022 1249   MCHC 34.5 09/24/2022 1249   RDW 13.9 09/24/2022 1249   RDW 13.1 12/06/2020 1456   LYMPHSABS 2.2 09/24/2022 1249   LYMPHSABS 2.0 12/06/2020 1456   MONOABS 0.6 09/24/2022 1249   EOSABS 0.2 09/24/2022  1249   EOSABS 0.1 12/06/2020 1456   BASOSABS 0.1 09/24/2022 1249   BASOSABS 0.1 12/06/2020 1456    Lab Results  Component Value Date   HGBA1C 9.4 (A) 03/24/2023   Lab Results  Component Value Date   INR 3.0 08/04/2023   INR 2.6 06/15/2023   INR 2.7 05/11/2023     Assessment & Plan:      Type 2 Diabetes Mellitus A1c of 9.4, uncontrolled.   Patient is on Glimepiride and Jardiance. Upcoming endocrinology appointment in January. -Continue current medications. -Check kidney and liver function today as well as A1c -Counseled on Diabetic diet, my plate method, 914 minutes of moderate intensity exercise/week Blood sugar logs with fasting goals of 80-120 mg/dl, random of less than 782 and in the event of sugars less than 60 mg/dl or greater than 956 mg/dl encouraged to notify the clinic. Advised on the need for annual eye exams, annual foot exams, Pneumonia vaccine.   Peripheral Neuropathy Moderate pain (4/10) managed with Tramadol. -Continue Tramadol as needed. -Perform urine drug screen today.  Bipolar Disorder Stable on Abilify, duloxetine, doxepin patient reports satisfaction with current psychiatric care. -Continue current psychiatric follow-up and medication.  Tobacco Use Patient continues to smoke, acknowledges the need to quit but cites current stressors as a barrier. -Encourage smoking cessation.  Obesity Patient has gained 20 pounds in 3 months, possibly related to Glimepiride and/or Abilify. Patient expresses concern about weight gain. -Encourage increased physical activity, starting with 10 minutes of walking daily.  Chronic DVT and PE INR of 3.0, no bleeding or bruising reported. -Continue current anticoagulation regimen per clinical pharmacist.         No orders of the defined types were placed in this encounter.   Follow-up: Return in about 6 months (around 02/02/2024) for Chronic medical conditions.       Hoy Register, MD, FAAFP. [facility] [place], [place] [phone]   08/05/2023, 2:59 PM

## 2023-08-05 NOTE — Patient Instructions (Addendum)
VISIT SUMMARY:  Today, we reviewed your diabetes, neuropathy, bipolar disorder, smoking habits, weight gain, and anticoagulation therapy. We discussed your current medications and made plans for further management and follow-up.  YOUR PLAN:  -TYPE 2 DIABETES MELLITUS: Type 2 diabetes is a condition where your body does not use insulin properly, leading to high blood sugar levels. Your A1c remains at 9.4, and you are currently taking Glimepiride and Jardiance. We will continue with your current medications per your Endocrinologist and check your kidney and liver function today.  -PERIPHERAL NEUROPATHY: Peripheral neuropathy is nerve damage that causes pain and numbness, often in the hands and feet. You rate your pain as 4 out of 10 and manage it with Tramadol. We will continue with Tramadol as needed and perform a urine drug screen today.  -BIPOLAR DISORDER: Bipolar disorder is a mental health condition characterized by extreme mood swings. Your condition is stable with Abilify, Duloxetine, Doxepin and you are satisfied with your current psychiatric care. We will continue with your current psychiatric follow-up and medication.  -TOBACCO USE: Smoking is harmful to your health and can worsen other conditions. Although you acknowledge the need to quit, current stressors are a barrier. We encourage you to consider smoking cessation.  -WEIGHT GAIN: You have gained 20 pounds in the last three months, which may be related to your medications. We recommend increasing your physical activity, starting with 10 minutes of walking daily.  -ANTICOAGULATION: Anticoagulation therapy helps prevent blood clots. Your INR is 3.0, and you have not reported any bleeding or bruising. We will continue with your current anticoagulation regimen.  INSTRUCTIONS:  Please follow up with your endocrinologist in January as scheduled. Today, we will check your kidney and liver function and perform a urine drug screen. Continue  with your current medications and consider increasing your physical activity. If you have any concerns or experience any new symptoms, please contact our office.

## 2023-08-06 LAB — CMP14+EGFR
ALT: 33 [IU]/L (ref 0–44)
AST: 24 [IU]/L (ref 0–40)
Albumin: 4.4 g/dL (ref 4.1–5.1)
Alkaline Phosphatase: 80 [IU]/L (ref 44–121)
BUN/Creatinine Ratio: 14 (ref 9–20)
BUN: 13 mg/dL (ref 6–24)
Bilirubin Total: 0.9 mg/dL (ref 0.0–1.2)
CO2: 24 mmol/L (ref 20–29)
Calcium: 9.3 mg/dL (ref 8.7–10.2)
Chloride: 99 mmol/L (ref 96–106)
Creatinine, Ser: 0.96 mg/dL (ref 0.76–1.27)
Globulin, Total: 2.8 g/dL (ref 1.5–4.5)
Glucose: 211 mg/dL — ABNORMAL HIGH (ref 70–99)
Potassium: 4.5 mmol/L (ref 3.5–5.2)
Sodium: 139 mmol/L (ref 134–144)
Total Protein: 7.2 g/dL (ref 6.0–8.5)
eGFR: 101 mL/min/{1.73_m2} (ref >59)

## 2023-08-06 LAB — HEMOGLOBIN A1C
Est. average glucose Bld gHb Est-mCnc: 180 mg/dL
Hgb A1c MFr Bld: 7.9 % — ABNORMAL HIGH (ref 4.8–5.6)

## 2023-08-07 ENCOUNTER — Ambulatory Visit: Payer: Self-pay

## 2023-08-07 NOTE — Patient Instructions (Signed)
Visit Information  Thank you for taking time to visit with me today. Please don't hesitate to contact me if I can be of assistance to you.   Following are the goals we discussed today:   Goals Addressed             This Visit's Progress    Effective management of DM   On track    Care Coordination Interventions: Provided education to patient about basic DM disease process Reviewed medications with patient and discussed importance of medication adherence Review of patient status, including review of consultants reports, relevant laboratory and other test results, and medications completed Positive reinforcement provided to patient for making efforts to improve his diabetes Discussed plans with patient for ongoing care coordination follow up and provided patient with direct contact information for nurse care coordinator Assessed patient's understanding of A1c goal: <7% Lab Results  Component Value Date   HGBA1C 7.9 (H) 08/05/2023          Our next appointment is by telephone on 10/13/23 at 1:00 PM  Please call the care guide team at [phone] if you need to cancel or reschedule your appointment.   If you are experiencing a Mental Health or Behavioral Health Crisis or need someone to talk to, please call 1-800-273-TALK (toll free, 24 hour hotline)  Patient verbalizes understanding of instructions and care plan provided today and agrees to view in MyChart. Active MyChart status and patient understanding of how to access instructions and care plan via MyChart confirmed with patient.     Bradley Sale RN BSN CCM [name]  [name], [name] Nurse Care Coordinator  Direct Dial: [phone] Website: [name]

## 2023-08-07 NOTE — Patient Outreach (Addendum)
  Care Coordination   Follow Up Visit Note   08/07/2023 Name: Bradley [name] MRN: [id number] DOB: 09/10/1981  Bradley [name] is a 42 y.o. year old male who sees Hoy Register, MD for primary care. I spoke with  Bradley [name] by phone today.  What matters to the patients health and wellness today?  Patient would like to continue to lower his A1c by adhering to his medication regimen.     Goals Addressed             This Visit's Progress    Effective management of DM   On track    Care Coordination Interventions: Provided education to patient about basic DM disease process Reviewed medications with patient and discussed importance of medication adherence Review of patient status, including review of consultants reports, relevant laboratory and other test results, and medications completed Positive reinforcement provided to patient for making efforts to improve his diabetes Discussed plans with patient for ongoing care coordination follow up and provided patient with direct contact information for nurse care coordinator Assessed patient's understanding of A1c goal: <7% Lab Results  Component Value Date   HGBA1C 7.9 (H) 08/05/2023      Interventions Today    Flowsheet Row Most Recent Value  Chronic Disease   Chronic disease during today's visit Diabetes  General Interventions   General Interventions Discussed/Reviewed General Interventions Reviewed, General Interventions Discussed, Labs, Doctor Visits, Annual Foot Exam, Annual Eye Exam  Doctor Visits Discussed/Reviewed PCP, Doctor Visits Reviewed, Doctor Visits Discussed  Education Interventions   Education Provided Provided Education  Provided Verbal Education On Labs, Medication, When to see the doctor, Nutrition  Labs Reviewed Hgb A1c, Kidney Function  Nutrition Interventions   Nutrition Discussed/Reviewed Nutrition Discussed, Nutrition Reviewed, Fluid intake  Pharmacy Interventions   Pharmacy Dicussed/Reviewed  Pharmacy Topics Reviewed, Pharmacy Topics Discussed, Medications and their functions          SDOH assessments and interventions completed:  No     Care Coordination Interventions:  Yes, provided   Follow up plan: Follow up call scheduled for 10/13/23 @1 :00 PM    Encounter Outcome:  Patient Visit Completed

## 2023-08-10 ENCOUNTER — Other Ambulatory Visit: Payer: Self-pay

## 2023-08-11 ENCOUNTER — Other Ambulatory Visit: Payer: Self-pay

## 2023-08-13 LAB — DRUG SCREEN 12+ALCOHOL+CRT, UR
Amphetamines, Urine: NEGATIVE ng/mL
BENZODIAZ UR QL: NEGATIVE ng/mL
Barbiturate: NEGATIVE ng/mL
Cannabinoids: NEGATIVE ng/mL
Cocaine (Metabolite): NEGATIVE ng/mL
Creatinine, Urine: 46.4 mg/dL (ref 20.0–300.0)
Ethanol, Urine: POSITIVE — AB
Meperidine: NEGATIVE ng/mL
Methadone: NEGATIVE ng/mL
OPIATE SCREEN URINE: NEGATIVE ng/mL
Oxycodone/Oxymorphone, Urine: NEGATIVE ng/mL
Phencyclidine: NEGATIVE ng/mL
Propoxyphene: NEGATIVE ng/mL
Tramadol Ur: POSITIVE — AB

## 2023-08-15 NOTE — Telephone Encounter (Signed)
 Care team updated and letter sent for eye exam notes.

## 2023-08-17 ENCOUNTER — Other Ambulatory Visit: Payer: Self-pay

## 2023-09-03 ENCOUNTER — Ambulatory Visit: Payer: Medicare HMO | Attending: Family Medicine | Admitting: Pharmacist

## 2023-09-03 ENCOUNTER — Other Ambulatory Visit: Payer: Self-pay

## 2023-09-03 ENCOUNTER — Other Ambulatory Visit: Payer: Self-pay | Admitting: Family Medicine

## 2023-09-03 DIAGNOSIS — E1149 Type 2 diabetes mellitus with other diabetic neurological complication: Secondary | ICD-10-CM

## 2023-09-03 DIAGNOSIS — I825Y2 Chronic embolism and thrombosis of unspecified deep veins of left proximal lower extremity: Secondary | ICD-10-CM | POA: Diagnosis not present

## 2023-09-03 LAB — POCT INR: POC INR: 2.6

## 2023-09-03 MED ORDER — TRAMADOL HCL 50 MG PO TABS
50.0000 mg | ORAL_TABLET | Freq: Two times a day (BID) | ORAL | 4 refills | Status: DC | PRN
  Filled 2023-09-03 – 2023-09-08 (×3): qty 60, 30d supply, fill #0
  Filled 2023-10-05: qty 60, 30d supply, fill #1
  Filled 2023-11-02: qty 60, 30d supply, fill #2
  Filled 2023-11-29: qty 60, 30d supply, fill #3
  Filled 2023-12-27: qty 60, 30d supply, fill #4

## 2023-09-07 ENCOUNTER — Other Ambulatory Visit: Payer: Self-pay

## 2023-09-08 ENCOUNTER — Other Ambulatory Visit: Payer: Self-pay

## 2023-09-15 ENCOUNTER — Ambulatory Visit (INDEPENDENT_AMBULATORY_CARE_PROVIDER_SITE_OTHER): Payer: Medicare HMO | Admitting: Podiatry

## 2023-09-15 ENCOUNTER — Encounter: Payer: Self-pay | Admitting: Podiatry

## 2023-09-15 DIAGNOSIS — M79674 Pain in right toe(s): Secondary | ICD-10-CM

## 2023-09-15 DIAGNOSIS — L84 Corns and callosities: Secondary | ICD-10-CM

## 2023-09-15 DIAGNOSIS — E1142 Type 2 diabetes mellitus with diabetic polyneuropathy: Secondary | ICD-10-CM

## 2023-09-15 DIAGNOSIS — M79675 Pain in left toe(s): Secondary | ICD-10-CM

## 2023-09-15 DIAGNOSIS — B351 Tinea unguium: Secondary | ICD-10-CM | POA: Diagnosis not present

## 2023-09-15 NOTE — Progress Notes (Signed)
  Subjective:  Patient ID: Bradley [name], male    DOB: 1980/11/10,   MRN: [id number]  No chief complaint on file.   42 y.o. male presents for concern of thickened elongated and painful nails that are difficult to trim. Requesting to have them trimmed today. Relates burning and tingling in their feet. Patient is diabetic and last A1c was  Lab Results  Component Value Date   HGBA1C 7.9 (H) 08/05/2023   .   PCP:  Hoy Register, MD    . Denies any other pedal complaints. Denies n/v/f/c.   Past Medical History:  Diagnosis Date   ADHD    ADHD (attention deficit hyperactivity disorder)    Bipolar 1 disorder (HCC)    Bipolar disorder (HCC)    Depression    Diabetes mellitus without complication (HCC)    Hyperlipidemia    Morbid obesity (HCC)    Obesity    Panic attack    Varicose veins of both lower extremities with pain     Objective:  Physical Exam: Vascular: DP/PT pulses 2/4 bilateral. CFT <3 seconds. Absent hair growth on digits. Edema noted to bilateral lower extremities. Xerosis noted bilaterally.  Skin. No lacerations or abrasions bilateral feet. Nails 1-5 bilateral  are thickened discolored and elongated with subungual debris. Hyperkeratotic lesion noted plantar fifth metatarsal head on right. Feet noticibly dirty and full of debris from walking barefoot constantly.   Musculoskeletal: MMT 5/5 bilateral lower extremities in DF, PF, Inversion and Eversion. Deceased ROM in DF of ankle joint.  Neurological: Sensation intact to light touch. Protective sensation diminished bilateral.    Assessment:   1. Pain due to onychomycosis of toenails of both feet   2. Diabetic peripheral neuropathy associated with type 2 diabetes mellitus (HCC)      Plan:  Patient was evaluated and treated and all questions answered. -Discussed and educated patient on diabetic foot care, especially with  regards to the vascular, neurological and musculoskeletal systems.  -Stressed the importance  of good glycemic control and the detriment of not  controlling glucose levels in relation to the foot. -Discussed supportive shoes at all times and checking feet regularly.  -Mechanically debrided all nails 1-5 bilateral using sterile nail nipper and filed with dremel without incident  -Hyperkeratotic tissue debrided with chisel without incident.  -Answered all patient questions -Patient to return  in 3 months for at risk foot care -Patient advised to call the office if any problems or questions arise in the meantime.   Louann Sjogren, DPM

## 2023-09-28 ENCOUNTER — Other Ambulatory Visit: Payer: Self-pay

## 2023-09-28 DIAGNOSIS — F411 Generalized anxiety disorder: Secondary | ICD-10-CM | POA: Diagnosis not present

## 2023-09-28 DIAGNOSIS — F3132 Bipolar disorder, current episode depressed, moderate: Secondary | ICD-10-CM | POA: Diagnosis not present

## 2023-09-28 DIAGNOSIS — F4312 Post-traumatic stress disorder, chronic: Secondary | ICD-10-CM | POA: Diagnosis not present

## 2023-09-29 ENCOUNTER — Other Ambulatory Visit: Payer: Self-pay

## 2023-10-06 ENCOUNTER — Other Ambulatory Visit: Payer: Self-pay

## 2023-10-06 ENCOUNTER — Ambulatory Visit: Payer: Medicare HMO | Attending: Family Medicine | Admitting: Pharmacist

## 2023-10-06 DIAGNOSIS — I825Y2 Chronic embolism and thrombosis of unspecified deep veins of left proximal lower extremity: Secondary | ICD-10-CM | POA: Diagnosis not present

## 2023-10-06 LAB — POCT INR: POC INR: 2.2

## 2023-10-09 ENCOUNTER — Ambulatory Visit: Payer: Medicare HMO | Admitting: Dietician

## 2023-10-09 ENCOUNTER — Encounter: Payer: Self-pay | Admitting: Internal Medicine

## 2023-10-09 ENCOUNTER — Ambulatory Visit (INDEPENDENT_AMBULATORY_CARE_PROVIDER_SITE_OTHER): Payer: Medicare HMO | Admitting: Internal Medicine

## 2023-10-09 ENCOUNTER — Other Ambulatory Visit: Payer: Self-pay

## 2023-10-09 VITALS — BP 110/70 | HR 89 | Ht 72.0 in | Wt 300.6 lb

## 2023-10-09 DIAGNOSIS — Z794 Long term (current) use of insulin: Secondary | ICD-10-CM | POA: Diagnosis not present

## 2023-10-09 DIAGNOSIS — E113293 Type 2 diabetes mellitus with mild nonproliferative diabetic retinopathy without macular edema, bilateral: Secondary | ICD-10-CM | POA: Diagnosis not present

## 2023-10-09 DIAGNOSIS — E1142 Type 2 diabetes mellitus with diabetic polyneuropathy: Secondary | ICD-10-CM

## 2023-10-09 DIAGNOSIS — E1165 Type 2 diabetes mellitus with hyperglycemia: Secondary | ICD-10-CM

## 2023-10-09 LAB — POCT GLYCOSYLATED HEMOGLOBIN (HGB A1C): Hemoglobin A1C: 8.5 % — AB (ref 4.0–5.6)

## 2023-10-09 LAB — POCT GLUCOSE (DEVICE FOR HOME USE): POC Glucose: 216 mg/dL — AB (ref 70–99)

## 2023-10-09 MED ORDER — GLIMEPIRIDE 4 MG PO TABS
4.0000 mg | ORAL_TABLET | Freq: Every day | ORAL | 3 refills | Status: DC
  Filled 2023-10-09: qty 90, 90d supply, fill #0

## 2023-10-09 MED ORDER — EMPAGLIFLOZIN 25 MG PO TABS
25.0000 mg | ORAL_TABLET | Freq: Every day | ORAL | 3 refills | Status: DC
  Filled 2023-10-09 – 2023-11-06 (×2): qty 90, 90d supply, fill #0
  Filled 2024-02-02: qty 90, 90d supply, fill #1
  Filled 2024-05-06: qty 90, 90d supply, fill #2

## 2023-10-09 NOTE — Patient Instructions (Addendum)
-   Increase  Glimepiride  4 mg , 1 tablet before the first meal of the day  - Continue Jardiance  25 mg, 1 tablet daily      HOW TO TREAT LOW BLOOD SUGARS (Blood sugar LESS THAN 70 MG/DL) Please follow the RULE OF 15 for the treatment of hypoglycemia treatment (when your (blood sugars are less than 70 mg/dL)   STEP 1: Take 15 grams of carbohydrates when your blood sugar is low, which includes:  3-4 GLUCOSE TABS  OR 3-4 OZ OF JUICE OR REGULAR SODA OR ONE TUBE OF GLUCOSE GEL    STEP 2: RECHECK blood sugar in 15 MINUTES STEP 3: If your blood sugar is still low at the 15 minute recheck --> then, go back to STEP 1 and treat AGAIN with another 15 grams of carbohydrates.

## 2023-10-09 NOTE — Progress Notes (Signed)
 Name: Bradley [name]  Age/ Sex: 43 y.o., male   MRN/ DOB: [id number], [birth year]     PCP: Delbert Clam, MD   Reason for Endocrinology Evaluation: Type 2 Diabetes Mellitus  Initial Endocrine Consultative Visit: 10/18/2020    PATIENT IDENTIFIER: Bradley [name] is a 43 y.o. male with a past medical history of T2Dm, DVT and bipolar disorder. The patient has followed with Endocrinology clinic since 10/18/2020 for consultative assistance with management of his diabetes.  DIABETIC HISTORY:  Bradley [name] was diagnosed with DM yrs ago. Basaglar  made him feel sluggish. His hemoglobin A1c has ranged from 7.7% in 2019, peaking at 14.9% in 2021.   On his initial visit to our clinic his A1c was 13.6 %, he DECLINED insulin . He was on Glipizide  and Trulicity , we increased Glipizide , Trulicity  and added Pioglitazone . By the time he went  To see our RD in 12/2020 he was off pioglitazone   Due to abdominal cramps    Stopped Glipizide  and started basal insulin  12/2020  We stop Trulicity  by March 2023 as he was not taking it and he would forget to take it, we will started him on Jardiance  instead as his daily tablet to improve compliance  He self discontinued basal insulin  2024, and declined prandial insulin  as well as CeQur  Attempted to prescribe Rybelsus  but developed side effects 04/2023 and started glimepiride    He is disabled due to mental health   SUBJECTIVE:   During the last visit (03/24/2023): A1c 9.4%     Today (10/09/2023): Bradley [name] is here for a follow up on diabetes management.  He is accompanied by [name].  He does not check his glucose and has no interest in doing so.    Denies nausea, vomiting Denies constipation or diarrhea  Denies UTI He was seen by podiatry 08/2023, patient complaining of worsening lower extremity pain that he attributes to cold weather   HOME DIABETES REGIMEN:  Jardiance  25 mg daily Glimepiride  2 mg daily    Statin: no ACE-I/ARB:  no Prior Diabetic Education: yes   METER DOWNLOAD SUMMARY: Did not bring    DIABETIC COMPLICATIONS: Microvascular complications:  Neuropathy, B/L DR  Denies: CKD retinopathy Last Eye Exam: Completed 04/2023  Macrovascular complications:   Denies: CAD, CVA, PVD   HISTORY:  Past Medical History:  Past Medical History:  Diagnosis Date  . ADHD   . ADHD (attention deficit hyperactivity disorder)   . Bipolar 1 disorder (HCC)   . Bipolar disorder (HCC)   . Depression   . Diabetes mellitus without complication (HCC)   . Hyperlipidemia   . Morbid obesity (HCC)   . Obesity   . Panic attack   . Varicose veins of both lower extremities with pain    Past Surgical History:  Past Surgical History:  Procedure Laterality Date  . surgery on meatus as a child     Social History:  reports that he has been smoking cigarettes. He has a 15 pack-year smoking history. He has never used smokeless tobacco. He reports current alcohol  use of about 1.0 standard drink of alcohol  per week. He reports that he does not use drugs. Family History:  Family History  Problem Relation Age of Onset  . Rheum arthritis Mother   . Diabetes Mother   . Heart failure Mother   . Stroke Mother   . ADD / ADHD Mother   . Anxiety disorder Mother   . Arthritis Mother   . Depression Mother   . Drug abuse  Mother   . Alcohol  abuse Father   . Glaucoma Maternal Grandmother   . COPD Maternal Grandmother   . Lung cancer Paternal Grandmother        smoker     HOME MEDICATIONS: Allergies as of 10/09/2023       Reactions   Actos  [pioglitazone ] Other (See Comments)   Stomach cramps   Gabapentin  Other (See Comments)   Crying spells   Lyrica  [pregabalin ] Other (See Comments)   Makes the patient somnolent        Medication List        Accurate as of October 09, 2023  1:06 PM. If you have any questions, ask your nurse or doctor.          ACCU-CHEK ACTIVE GLUCOSE CONT VI by In Vitro route.    Accu-Chek Guide test strip Generic drug: glucose blood 1 each by Other route 3 (three) times daily. Use as instructed   ARIPiprazole  5 MG tablet Commonly known as: ABILIFY  Take 1 tablet (5 mg total) by mouth at bedtime.   atorvastatin  20 MG tablet Commonly known as: LIPITOR Take 1 tablet (20 mg total) by mouth daily.   doxepin  50 MG capsule Commonly known as: SINEQUAN  Take 1 capsule (50 mg total) by mouth as needed for sleep   doxepin  25 MG capsule Commonly known as: SINEQUAN  Take 25 mg by mouth at bedtime as needed.   DULoxetine  30 MG capsule Commonly known as: CYMBALTA  Take 30 mg by mouth daily.   DULoxetine  60 MG capsule Commonly known as: CYMBALTA  Take 1 capsule(s) by mouth at bedtime for anxiety/depression   gentamicin  cream 0.1 % Commonly known as: GARAMYCIN  Apply to wound left lower extremity once daily until healed.   glimepiride  2 MG tablet Commonly known as: AMARYL  Take 1 tablet (2 mg total) by mouth daily before breakfast.   Jardiance  25 MG Tabs tablet Generic drug: empagliflozin  Take 1 tablet (25 mg total) by mouth daily before breakfast.   mupirocin  ointment 2 % Commonly known as: BACTROBAN  Apply to right great toe once daily.   OneTouch Delica Lancets 33G Misc 1 Device by Does not apply route 3 (three) times daily.   sildenafil  50 MG tablet Commonly known as: Viagra  Take 1 tablet (50 mg total) by mouth daily as needed for erectile dysfunction. At least 24 hours between doses   traMADol  50 MG tablet Commonly known as: ULTRAM  Take 1 tablet (50 mg total) by mouth every 12 (twelve) hours as needed.   warfarin 2.5 MG tablet Commonly known as: COUMADIN  Take as directed by the anticoagulation clinic. If you are unsure how to take this medication, talk to your nurse or doctor. Original instructions: TAKE 1 TABLET BY MOUTH DAILY AS DIRECTED BY THE COUMADIN  CLINIC.         OBJECTIVE:   Vital Signs: BP 110/70 (BP Location: Left Arm, Patient  Position: Sitting, Cuff Size: Normal)   Pulse 89   Ht 6' (1.829 m)   Wt (!) 300 lb 9.6 oz (136.4 kg)   SpO2 99%   BMI 40.77 kg/m   Wt Readings from Last 3 Encounters:  10/09/23 (!) 300 lb 9.6 oz (136.4 kg)  08/05/23 287 lb 3.2 oz (130.3 kg)  03/24/23 261 lb (118.4 kg)     Exam: General: Pt appears well and is in NAD  Lungs: CTA bilaterally  Heart: RRR   Abdomen: soft, nontender  Extremities: Trace pretibial edema.  Stasis dermatitis over the shins  Neuro: MS is good  with appropriate affect, pt is alert and Ox3     DM foot exam: 09/15/2023 per podiatry      DATA REVIEWED:  Lab Results  Component Value Date   HGBA1C 8.5 (A) 10/09/2023   HGBA1C 7.9 (H) 08/05/2023   HGBA1C 9.4 (A) 03/24/2023    Latest Reference Range & Units 02/04/23 08:41  Sodium 134 - 144 mmol/L 137  Potassium 3.5 - 5.2 mmol/L 4.4  Chloride 96 - 106 mmol/L 98  CO2 20 - 29 mmol/L 21  Glucose 70 - 99 mg/dL 781 (H)  BUN 6 - 24 mg/dL 14  Creatinine 9.23 - 8.72 mg/dL 8.96  Calcium  8.7 - 10.2 mg/dL 9.2  BUN/Creatinine Ratio 9 - 20  14  eGFR >59 mL/min/1.73 93  Alkaline Phosphatase 44 - 121 IU/L 74  Albumin 4.1 - 5.1 g/dL 4.4  Albumin/Globulin Ratio 1.2 - 2.2  1.7  AST 0 - 40 IU/L 25  ALT 0 - 44 IU/L 35  Total Protein 6.0 - 8.5 g/dL 7.0  Total Bilirubin 0.0 - 1.2 mg/dL 0.7  (H): Data is abnormally high   Latest Reference Range & Units 02/04/23 08:41  Cholesterol, Total 100 - 199 mg/dL 869  HDL Cholesterol >60 mg/dL 48  Total Non-HDL-Chol (LDL+VLDL) 0 - 129 mg/dL 82  Triglycerides 0 - 850 mg/dL 889  VLDL Cholesterol Cal 5 - 40 mg/dL 20  LDL Chol Calc (NIH) 0 - 99 mg/dL 62  Globulin, Total 1.5 - 4.5 g/dL 2.6   In office BG 783 mg/dL    ASSESSMENT / PLAN / RECOMMENDATIONS:   1) Type 2 Diabetes Mellitus, Poorly controlled, With Neuropathic and retinopathic complications - Most recent A1c of 8.5%. Goal A1c < 7.0 %.   -A1c remains above goal -He was recently diagnosed with diabetic retinopathy,  ophthalmology is monitoring, no intervention at this time, discussed the importance of optimizing glucose control to prevent further damage -Intolerant to Trulicity  and Rybelsus  -Declines insulin  -Will increase glimepiride  as below, emphasized the importance of taking this 15-20 minutes before the first meal of the day -He understand that Jardiance  is better taking in the morning but he takes it at night -Encouraged exercise and low-carb diet to offset any weight gain with the glimepiride  increase   MEDICATIONS:  Increase glimepiride  4 mg daily Continue  Jardiance  25 mg daily   EDUCATION / INSTRUCTIONS: BG monitoring instructions: Patient is instructed to check his blood sugars 3 times a day, before meals  Call [facility] if: BG persistently < 70  I reviewed the Rule of 15 for the treatment of hypoglycemia in detail with the patient. Literature supplied.    2) Diabetic complications:  Eye: Does not have known diabetic retinopathy.  Neuro/ Feet: Does  have known diabetic peripheral neuropathy  Renal: Patient does not have known baseline CKD. He   is not on an ACEI/ARB at present.   3) Hypertriglyceridemia:  -Historically he has had elevated triglycerides up to 367 mg/DL in 7978, over the years this has trended down to normal levels  -LDL at 65 mg/DL -Patient on atorvastatin  20 mg daily   F/U in 6 months  Signed electronically by: Stefano Redgie Butts, MD  [facility]  [facility] [address] [place], [place] [address] Phone: [phone] FAX: [phone]   CC: Delbert Clam, MD [address] [place] [address] [place] [address] Phone: [phone]  Fax: [phone]  Return to Endocrinology clinic as below: Future Appointments  Date Time Provider  Department Center  10/09/2023  2:20 PM Hilberto Burzynski, Donell Cardinal, MD [facility] None  10/13/2023  1:00 PM Little, Clayborne CROME, RN [facility] None  10/20/2023  3:15  PM Knox Leita CROME, RD [facility] NDM  11/09/2023  2:00 PM Fleeta Tonia Garnette CROME, RPH-CPP [facility] None  12/15/2023  1:45 PM Gaynel Delon CROME, DPM [facility]  02/02/2024  2:30 PM Delbert Clam, MD [facility] None  02/16/2024  2:00 PM [facility] ANNUAL WELLNESS VISIT [facility] None  05/16/2024  1:00 PM Valdemar Rogue, MD [facility] None

## 2023-10-13 ENCOUNTER — Ambulatory Visit: Payer: Self-pay

## 2023-10-13 NOTE — Patient Instructions (Signed)
 Visit Information  Thank you for taking time to visit with me today. Please don't hesitate to contact me if I can be of assistance to you.   Following are the goals we discussed today:   Goals Addressed             This Visit's Progress    Effective management of DM   On track    Care Coordination Interventions: Provided education to patient about basic DM disease process Determined patient completed a recent follow up with Dr. Sam, Endocrinologist for evaluation of his diabetes Review of patient status, including review of consultants reports, relevant laboratory and other test results, and medications completed Reviewed medications with patient and reviewed MD recommendations to increase dosage of Glimepiride  to 4 mg daily with breakfast, patient has started this increase w/o noted SE thus far, discussed the importance of medication adherence and notifying his doctor if SE are noted  Determined patient is currently not checking his blood sugars at home Educated patient about the benefits of continuous blood glucose monitoring using a Dexcom and or Libre sensor if eligible and patient is agreeable Reviewed and discussed with patient his next scheduled in person visit with Herlene Salinas Ausdall RPH-CPP is scheduled for 11/09/23 @2 :00 PM Sent in basket message to [place] requesting assistance with checking eligibility for continuous blood glucose monitoring  Discussed patient's concerns about having gained about 40 lbs since last RN contact, he believes this is a SE from Glimepiride   Counseled patient on recommendations for 150 minutes of moderate intensity exercise/week Educated patient about the PREP program and patient would like to participate Sent in basket message to Dr. Delbert requesting a referral  Email sent to the PREP team advising patient would like to participate in PREP at the [facility] Discussed plans with patient for ongoing care coordination follow up and provided  patient with direct contact information for nurse care coordinator Assessed patient's understanding of A1c goal: <7.0% Lab Results  Component Value Date   HGBA1C 8.5 (A) 10/09/2023           Our next appointment is by telephone on 11/16/23 at 1:00 PM  Please call the care guide team at [phone] if you need to cancel or reschedule your appointment.   If you are experiencing a Mental Health or Behavioral Health Crisis or need someone to talk to, please call 1-800-273-TALK (toll free, 24 hour hotline)  Patient verbalizes understanding of instructions and care plan provided today and agrees to view in MyChart. Active MyChart status and patient understanding of how to access instructions and care plan via MyChart confirmed with patient.     Clayborne Ly RN BSN CCM [place]  [facility], [facility] Nurse Care Coordinator  Direct Dial: [phone] Website: [email or web address]

## 2023-10-13 NOTE — Patient Outreach (Signed)
 Care Coordination   Follow Up Visit Note   10/13/2023 Name: Bradley [name] MRN: [id number] DOB: Jun 23, 1981  Bradley [name] is a 43 y.o. year old male who sees Delbert Clam, MD for primary care. I spoke with  Bradley [name] by phone today.  What matters to the patients health and wellness today?  Patient would like to work on losing weight and lowering his A1C.     Goals Addressed             This Visit's Progress    Effective management of DM   On track    Care Coordination Interventions: Provided education to patient about basic DM disease process Determined patient completed a recent follow up with Dr. Sam, Endocrinologist for evaluation of his diabetes Review of patient status, including review of consultants reports, relevant laboratory and other test results, and medications completed Reviewed medications with patient and reviewed MD recommendations to increase dosage of Glimepiride  to 4 mg daily with breakfast, patient has started this increase w/o noted SE thus far, discussed the importance of medication adherence and notifying his doctor if SE are noted  Determined patient is currently not checking his blood sugars at home Educated patient about the benefits of continuous blood glucose monitoring using a Dexcom and or Libre sensor if eligible and patient is agreeable Reviewed and discussed with patient his next scheduled in person visit with Herlene Salinas Ausdall RPH-CPP is scheduled for 11/09/23 @2 :00 PM Sent in basket message to [place] requesting assistance with checking eligibility for continuous blood glucose monitoring  Discussed patient's concerns about having gained about 40 lbs since last RN contact, he believes this is a SE from Glimepiride   Counseled patient on recommendations for 150 minutes of moderate intensity exercise/week Educated patient about the PREP program and patient would like to participate Sent in basket message to Dr. Delbert requesting a  referral  Email sent to the PREP team advising patient would like to participate in PREP at the [facility] Discussed plans with patient for ongoing care coordination follow up and provided patient with direct contact information for nurse care coordinator Assessed patient's understanding of A1c goal: <7.0% Lab Results  Component Value Date   HGBA1C 8.5 (A) 10/09/2023       Interventions Today    Flowsheet Row Most Recent Value  Chronic Disease   Chronic disease during today's visit Diabetes  General Interventions   General Interventions Discussed/Reviewed General Interventions Discussed, General Interventions Reviewed, Labs, Doctor Visits, Durable Medical Equipment (DME), Communication with  Doctor Visits Discussed/Reviewed Doctor Visits Reviewed, Doctor Visits Discussed, Specialist, PCP  Durable Medical Equipment (DME) Glucomoter  Communication with PCP/Specialists, Pharmacists  [Dr. Delbert,  Herlene Salinas Ausdall RPH]  Exercise Interventions   Exercise Discussed/Reviewed Physical Activity, Exercise Reviewed, Exercise Discussed, Weight Managment  Physical Activity Discussed/Reviewed Physical Activity Discussed, Physical Activity Reviewed, PREP  Weight Management Weight loss  Education Interventions   Education Provided Provided Education  Provided Verbal Education On Labs, Medication, Exercise, Blood Sugar Monitoring, When to see the doctor  Labs Reviewed Hgb A1c  Nutrition Interventions   Nutrition Discussed/Reviewed Nutrition Discussed, Nutrition Reviewed, Portion sizes  Pharmacy Interventions   Pharmacy Dicussed/Reviewed Pharmacy Topics Discussed, Pharmacy Topics Reviewed, Medications and their functions, Referral to Pharmacist  Referral to Pharmacist --  [assist w/obtaining a Dexom/Libre sensor]            SDOH assessments and interventions completed:  No     Care Coordination Interventions:  Yes, provided  Follow up plan: Follow up call scheduled for 11/16/23  @1 :00 PM    Encounter Outcome:  Patient Visit Completed

## 2023-10-15 ENCOUNTER — Other Ambulatory Visit: Payer: Self-pay | Admitting: Family Medicine

## 2023-10-15 DIAGNOSIS — Z794 Long term (current) use of insulin: Secondary | ICD-10-CM

## 2023-10-16 ENCOUNTER — Telehealth: Payer: Self-pay | Admitting: *Deleted

## 2023-10-16 NOTE — Telephone Encounter (Signed)
Contacted regarding PREP Class referral. Left voice message to return call for more information. 

## 2023-10-16 NOTE — Telephone Encounter (Signed)
Returned my call regarding PREP Class referral. Would like to participate at the [facility]. Class to begin November 09, 2023 M/W [phone] for 12 weeks.

## 2023-10-20 ENCOUNTER — Other Ambulatory Visit: Payer: Self-pay

## 2023-10-20 ENCOUNTER — Encounter: Payer: Self-pay | Admitting: Dietician

## 2023-10-20 ENCOUNTER — Encounter: Payer: Medicare HMO | Attending: Internal Medicine | Admitting: Dietician

## 2023-10-20 VITALS — Wt 300.0 lb

## 2023-10-20 DIAGNOSIS — E1165 Type 2 diabetes mellitus with hyperglycemia: Secondary | ICD-10-CM | POA: Diagnosis not present

## 2023-10-20 DIAGNOSIS — Z794 Long term (current) use of insulin: Secondary | ICD-10-CM | POA: Insufficient documentation

## 2023-10-20 NOTE — Patient Instructions (Signed)
Consider taking the Jardiance in the morning as you are waking frequently  to urinate.  Be consistent with the [employer] class. Please let me know if you need training on the Continuous Glucose Monitor.  Purchase the less nicotine vapes and aim to stop smoking and vaping.  Both are harmful.   Patient set goals:             Reduce cigarettes by 1 every week and don't increase vaping Take your dog to the park 3 times a week to walk.   Start with 10 minute walks and increase 5 minutes every week.   Self motivate - "I will play my video game after the walk."    Blood glucose goals:  80-130 fasting   100-180 two hours after starting any meal  Generally a rise of 40-60 points after a meal is normal  A1C Goal:   Less than 7%.  Your last A1C was  Lab Results  Component Value Date   HGBA1C 8.5 (A) 10/09/2023

## 2023-10-20 NOTE — Progress Notes (Signed)
Diabetes Self-Management Education  Visit Type:  Follow-up  Appt. Start Time: 1511 Appt. End Time: 1540  10/20/2023  Bradley [name], identified by name and date of birth, is a 43 y.o. male with a diagnosis of Diabetes:  .   ASSESSMENT Patient is here today with his girlfriend.  He was last seen by myself 05/29/2023. He is not taking any insulin as he does not like needles.  Multiple conversations regarding this. He is starting a PREP class [facility] program which meets Monday and Wednesday for 3 months. Gaining weight - states he needs to start to exercise. He is not checking his blood glucose.  He is unwilling to use a blood glucose meter.  He states that his insurance covers a CGM and he plans to get one at his next MD visit.  His phone is compatible. Blood glucose 276 in the office after eating today. Continues to Vape and smoke States his doctor is concerned about his eye health.  History includes Type 2 Diabetes (2019), DVT, Bipolar, ADHD, neuropathy, smoking, memory issues, polycythemia, thrombocytopenia, history of DVT/PE, nonproliferative retinopathy Labs noted to include:  A1C 8.5% 10/08/2022, 7.9% 08/05/2023, 9.4% 03/24/2023  cholesterol 193, Triglycerides 365, HDL 40, LDL 92 08/09/2020 Medications include:Trulicity(stopped), glipizide, actos (discontinued), Jardiance, Tresiba 70 units q HS (not taking), Novolog before meals (not taking), coumadin, Abilify and other medications that contribute to weight gain. He stopped pioglitizone after 3 days due to stomach cramps. He could not tolerated Metformin due to bowel incontinence.    Sleep:  Eats late (10pm - midnight).  Goes to sleep late.  Wakes about 10:30 am (although later now) Support:  [facility] [employer] [facility], patient of Dr. Lonzo Cloud at [facility], PCP Hoy Register, MD   Weight: 300 lbs 10/20/2023 269 lbs 05/29/2023 263 lbs 03/06/2023 262 lbs 12/08/2022 260 lbs 09/08/2022 261 lbs  07/21/2022 263 lbs 04/21/2022 278 lbs 12/19/2021 279 lbs 10/24/2021 282 lbs 08/16/2021 loss likely due to uncontrolled blood glucose 295 lbs 02/14/21 306 lbs 47/2022 306 lbs 11/23/2020 425 lbs highest adult weight 2 years ago (loss presumably due to uncontrolled blood sugar) Lowest adult weight 235 lbs about 8 years ago and he does not recall circumstances of increased weight gain.   Patient lives with his girlfriend.  She has type 2 diabetes.  He does the shopping and cooking.  Dislikes most vegetables. Drinking mostly sugar free options. He is on disability due to mental health.  Finances are a concern. His mother passed away 12-02-2020 from complications of diabetes. Girlfriend  goes to the gym 2-3 times per week [facility]).  Patient is not motivated to go. Complains of neuropathy pain which worsens with exercise.  Weight 300 lb (136.1 kg). Body mass index is 40.69 kg/m.    Diabetes Self-Management Education - 10/20/23 1540       Psychosocial Assessment   Patient Belief/Attitude about Diabetes Motivated to manage diabetes    What is the hardest part about your diabetes right now, causing you the most concern, or is the most worrisome to you about your diabetes?   Taking/obtaining medications    Self-care barriers None    Self-management support Doctor's office;CDE visits;Friends    Patient Concerns Glycemic Control    Special Needs None    Preferred Learning Style No preference indicated    Learning Readiness Contemplating      Pre-Education Assessment   Patient understands the diabetes disease and treatment process. Comprehends key points    Patient understands  incorporating nutritional management into lifestyle. Needs Review    Patient undertands incorporating physical activity into lifestyle. Needs Review    Patient understands using medications safely. Needs Review    Patient understands prevention, detection, and treatment of acute complications. Needs Review    Patient understands  prevention, detection, and treatment of chronic complications. Needs Review    Patient understands how to develop strategies to address psychosocial issues. Needs Review    Patient understands how to develop strategies to promote health/change behavior. Needs Review      Complications   Last HgB A1C per patient/outside source 8.5 %    How often do you check your blood sugar? 0 times/day (not testing)    Postprandial Blood glucose range (mg/dL) >161    Number of hypoglycemic episodes per month 0    Have you had a dilated eye exam in the past 12 months? Yes      Dietary Intake   Lunch 2 chicken sandwiches on honey wheat with 4 slices of cheese    Dinner BBQ chicken, mac and cheese, 4 sugar free pudding cups    Beverage(s) zero sugar soda, occasional regular coke, rare sugar free coffee, does not drink water      Activity / Exercise   Activity / Exercise Type ADL's      Patient Education   Previous Diabetes Education Yes (please comment)    Healthy Eating Meal options for control of blood glucose level and chronic complications.    Being Active Role of exercise on diabetes management, blood pressure control and cardiac health.    Medications Reviewed patients medication for diabetes, action, purpose, timing of dose and side effects.    Monitoring Identified appropriate SMBG and/or A1C goals.;Yearly dilated eye exam    Diabetes Stress and Support Identified and addressed patients feelings and concerns about diabetes;Worked with patient to identify barriers to care and solutions    Lifestyle and Health Coping Lifestyle issues that need to be addressed for better diabetes care;Review risk of smoking and offered smoking cessation      Individualized Goals (developed by patient)   Nutrition General guidelines for healthy choices and portions discussed    Physical Activity Exercise 3-5 times per week;60 minutes per day    Medications take my medication as prescribed    Monitoring  Consistenly  use CGM    Problem Solving Eating Pattern;Medication consistency;Addressing barriers to behavior change    Health Coping Discuss barriers to diabetes care with support person/system (comment specifics as needed);Ask for help with psychological, social, or emotional issues      Patient Self-Evaluation of Goals - Patient rates self as meeting previously set goals (% of time)   Nutrition 25 - 50% (sometimes)    Physical Activity < 25% (hardly ever/never)    Medications 50 - 75 % (half of the time)    Monitoring < 25% (hardly ever/never)    Problem Solving and behavior change strategies  < 25% (hardly ever/never)    Reducing Risk (treating acute and chronic complications) < 25% (hardly ever/never)    Health Coping < 25% (hardly ever/never)      Post-Education Assessment   Patient understands the diabetes disease and treatment process. Comprehends key points    Patient understands incorporating nutritional management into lifestyle. Needs Review    Patient undertands incorporating physical activity into lifestyle. Needs Review    Patient understands using medications safely. Needs Review    Patient understands monitoring blood glucose, interpreting and using results Needs  Review    Patient understands prevention, detection, and treatment of acute complications. Needs Review    Patient understands prevention, detection, and treatment of chronic complications. Needs Review    Patient understands how to develop strategies to address psychosocial issues. Needs Review    Patient understands how to develop strategies to promote health/change behavior. Needs Review      Outcomes   Program Status Not Completed      Subsequent Visit   Since your last visit have you continued or begun to take your medications as prescribed? No    Since your last visit have you experienced any weight changes? Gain    Weight Gain (lbs) 31             Learning Objective:  Patient will have a greater understanding  of diabetes self-management. Patient education plan is to attend individual and/or group sessions per assessed needs and concerns.   Plan:   Patient Instructions  Consider taking the Jardiance in the morning as you are waking frequently  to urinate.  Be consistent with the [employer] class. Please let me know if you need training on the Continuous Glucose Monitor.  Purchase the less nicotine vapes and aim to stop smoking and vaping.  Both are harmful.   Patient set goals:             Reduce cigarettes by 1 every week and don't increase vaping Take your dog to the park 3 times a week to walk.   Start with 10 minute walks and increase 5 minutes every week.   Self motivate - "I will play my video game after the walk."    Blood glucose goals:  80-130 fasting   100-180 two hours after starting any meal  Generally a rise of 40-60 points after a meal is normal  A1C Goal:   Less than 7%.  Your last A1C was  Lab Results  Component Value Date   HGBA1C 8.5 (A) 10/09/2023       Expected Outcomes:  Demonstrated limited interest in learning.  Expect minimal changes  Education material provided:   If problems or questions, patient to contact team via:  Phone  Future DSME appointment: -  2-3 months

## 2023-10-27 ENCOUNTER — Telehealth: Payer: Self-pay | Admitting: *Deleted

## 2023-10-27 NOTE — Telephone Encounter (Signed)
Contacted to schedule intake assessment appointment at the [facility] for PREP Class. Appointment scheduled for Monday 11/02/2023 at 1345.

## 2023-11-02 ENCOUNTER — Encounter: Payer: Self-pay | Admitting: *Deleted

## 2023-11-02 NOTE — Progress Notes (Signed)
[employer] PREP Evaluation  Patient Details  Name: Bradley [name] MRN: [id number] Date of Birth: 10-16-1980 Age: 43 y.o. PCP: Hoy Register, MD  Vitals:   11/02/23 1407  BP: 102/64  Pulse: (!) 105  Resp: (!) 24  SpO2: 96%  Weight: (!) 303 lb 12.8 oz (137.8 kg)  Height: 6' (1.829 m)     [facility] Eval - 11/02/23 1400       [facility] "PREP" Location   [facility]PREP[facility]      Referral    Referring Provider [place]    Reason for referral Current Smoker;High Cholesterol;Inactivity;Obesitity/Overweight;Diabetes    Program Start Date 11/09/23    Program End Date 01/27/24      Measurement   Waist Circumference 52.75 inches    Hip Circumference 56 inches    Body fat 40.2 percent      Information for Trainer   Goals weight loss 8-10lbs in 12 weeks, establish an exercise routine, increase strength and stamina,    Current Exercise none    Orthopedic Concerns Leg weakness form neuropathy, lt shoulder weakness    Pertinent Medical History DM, MO, Hx:DVT    Current Barriers none indicated. Girlfriend is his transportation    Restrictions/Precautions Diabetic snack before exercise    Medications that affect exercise --   takes warfarin daily     Mobility and Daily Activities   I find it easy to walk up or down two or more flights of stairs. 1    I have no trouble taking out the trash. 4    I do housework such as vacuuming and dusting on my own without difficulty. 3    I can easily lift a gallon of milk (8lbs). 4    I can easily walk a mile. 1    I have no trouble reaching into high cupboards or reaching down to pick up something from the floor. 3    I do not have trouble doing out-door work such as Loss adjuster, chartered, raking leaves, or gardening. 1      Mobility and Daily Activities   I feel younger than my age. 2    I feel independent. 4    I feel energetic. 3    I live an active life.  1    I feel strong. 2    I feel healthy. 2    I feel active as other people my age.  1      How fit and strong are you.   Fit and Strong Total Score 32            Past Medical History:  Diagnosis Date   ADHD    ADHD (attention deficit hyperactivity disorder)    Bipolar 1 disorder (HCC)    Bipolar disorder (HCC)    Depression    Diabetes mellitus without complication (HCC)    Hyperlipidemia    Morbid obesity (HCC)    Obesity    Panic attack    Varicose veins of both lower extremities with pain    Past Surgical History:  Procedure Laterality Date   surgery on meatus as a child     Social History   Tobacco Use  Smoking Status Every Day   Current packs/day: 1.00   Average packs/day: 1 pack/day for 15.0 years (15.0 ttl pk-yrs)   Types: Cigarettes  Smokeless Tobacco Never    Remo Lipps 11/02/2023, 2:14 PM  PREP Class beginning 11/09/2023 every Monday/Wednesday from 1300-1415 for 12 weeks. No  barriers to participation noted. Is reliant on transportation from girlfriend.

## 2023-11-03 ENCOUNTER — Other Ambulatory Visit: Payer: Self-pay

## 2023-11-03 ENCOUNTER — Other Ambulatory Visit (HOSPITAL_COMMUNITY): Payer: Self-pay

## 2023-11-04 DIAGNOSIS — Z008 Encounter for other general examination: Secondary | ICD-10-CM | POA: Diagnosis not present

## 2023-11-07 ENCOUNTER — Other Ambulatory Visit: Payer: Self-pay

## 2023-11-08 ENCOUNTER — Other Ambulatory Visit: Payer: Self-pay

## 2023-11-09 ENCOUNTER — Ambulatory Visit: Payer: Self-pay | Admitting: Pharmacist

## 2023-11-09 ENCOUNTER — Other Ambulatory Visit: Payer: Self-pay

## 2023-11-09 ENCOUNTER — Encounter: Payer: Self-pay | Admitting: *Deleted

## 2023-11-09 ENCOUNTER — Ambulatory Visit: Payer: Medicare HMO | Attending: Nurse Practitioner | Admitting: Pharmacist

## 2023-11-09 DIAGNOSIS — Z794 Long term (current) use of insulin: Secondary | ICD-10-CM

## 2023-11-09 DIAGNOSIS — E1165 Type 2 diabetes mellitus with hyperglycemia: Secondary | ICD-10-CM | POA: Diagnosis not present

## 2023-11-09 DIAGNOSIS — I825Y2 Chronic embolism and thrombosis of unspecified deep veins of left proximal lower extremity: Secondary | ICD-10-CM | POA: Diagnosis not present

## 2023-11-09 DIAGNOSIS — Z7984 Long term (current) use of oral hypoglycemic drugs: Secondary | ICD-10-CM

## 2023-11-09 LAB — POCT GLYCOSYLATED HEMOGLOBIN (HGB A1C): HbA1c, POC (controlled diabetic range): 8.6 % — AB (ref 0.0–7.0)

## 2023-11-09 LAB — POCT INR: POC INR: 2.2

## 2023-11-09 NOTE — Progress Notes (Signed)
    S:     No chief complaint on file.  43 y.o. male who presents for diabetes evaluation, education, and management in the context of the LIBERATE Study.   PMH is significant for T2DM complicated by neuropathy, HTN, chronic VTE (anticoagulated on Warfarin), bipolar I, morbid obesity, tobacco use.  Patient was referred and last seen by Primary Care Provider, Dr. Alvis Lemmings, on 08/04/2024. His DM is managed by his Endocrinologist, Dr. Lonzo Cloud.   Today, patient arrives in good spirits and presents without any assistance.  Current diabetes medications include: Jardiance 25 mg daily, glimepiride 4 mg daily  Patient reports adherence to taking all medications as prescribed.   Insurance coverage: [employer] Medicare  Patient denies hypoglycemic events.  Patient denies nocturia (nighttime urination).  Patient reports neuropathy (nerve pain). Patient denies visual changes. Patient reports self foot exams.   Patient reported dietary habits: not covered at this visit  Patient-reported exercise habits: recently enrolled in the STEP program. First class was today. He begins with physical activity Wednesday.    O:   ROS  Physical Exam   Lab Results  Component Value Date   HGBA1C 8.5 (A) 10/09/2023    There were no vitals filed for this visit.   Lipid Panel     Component Value Date/Time   CHOL 130 02/04/2023 0841   TRIG 110 02/04/2023 0841   HDL 48 02/04/2023 0841   CHOLHDL 3.9 12/06/2020 1456   CHOLHDL 4.0 11/12/2016 1613   VLDL 28 11/12/2016 1613   LDLCALC 62 02/04/2023 0841    Clinical Atherosclerotic Cardiovascular Disease (ASCVD): No  The 10-year ASCVD risk score (Arnett DK, et al., 2019) is: 2.9%   Values used to calculate the score:     Age: 24 years     Sex: Male     Is Non-Hispanic African American: No     Diabetic: Yes     Tobacco smoker: Yes     Systolic Blood Pressure: 102 mmHg     Is BP treated: No     HDL Cholesterol: 48 mg/dL     Total Cholesterol: 130  mg/dL   Patient is participating in a Managed Medicaid Plan: No    A/P:  LIBERATE Study:  -Patient provided verbal consent to participate in the study. Consent documented in electronic medical record.  -Provided education on Libre 3 CGM. Collaborated to ensure Josephine Igo 3 app was downloaded on patient's phone. Educated on how to place sensor every 14 days, patient placed first sensor correctly and verbalized understanding of use, removal, and how to place next sensor. Discussed alarms. 8 sensors provided for a 3 month supply. Educated to contact the office if the sensor falls off early and replacements are needed before their next [employer].    Diabetes longstanding currently uncontrolled based on A1c. He does have longstanding neuropathy but it otherwise asymptomatic today. Patient is able to verbalize appropriate hypoglycemia management plan. Medication adherence appears toe be appropriate. -Continued current regimen per Endocrine.  -Extensively discussed pathophysiology of diabetes, recommended lifestyle interventions, dietary effects on blood sugar control.  -Counseled on s/sx of and management of hypoglycemia.  -Next A1c anticipated 01/2024.   Written patient instructions provided. Patient verbalized understanding of treatment plan.  Total time in face to face counseling 30 minutes.    Follow-up:  Pharmacist in 1 month for INR.  Butch Penny, PharmD, Patsy Baltimore, CPP Clinical Pharmacist [facility] [phone]

## 2023-11-09 NOTE — Progress Notes (Signed)
 [employer] PREP Weekly Session  Patient Details  Name: Bradley [name] MRN: [id number] Date of Birth: January 02, 1981 Age: 43 y.o. PCP: Joaquin Mulberry, MD  Vitals:   11/09/23 1300  Weight: (!) 303 lb 12.8 oz (137.8 kg)     [facility] Weekly seesion - 11/09/23 1500       [facility] "PREP" Location   [facility] "PREP" Location [facility]      Weekly Session   Topic Discussed Goal setting and welcome to the program   Introductions, text book review, perceived exertion scale, tour facility, homework: no sitting longer than 30 minutes.   Classes attended to date 1             Howard Macho 11/09/2023, 9:06 PM

## 2023-11-09 NOTE — Addendum Note (Signed)
 Addended by: Verdel Gitelman L on: 11/09/2023 03:36 PM   Modules accepted: Orders, Level of Service

## 2023-11-16 ENCOUNTER — Encounter: Payer: Self-pay | Admitting: *Deleted

## 2023-11-16 ENCOUNTER — Ambulatory Visit: Payer: Self-pay

## 2023-11-16 NOTE — Patient Instructions (Signed)
Visit Information  Thank you for taking time to visit with me today. Please don't hesitate to contact me if I can be of assistance to you.   Following are the goals we discussed today:   Goals Addressed             This Visit's Progress    Effective management of DM   On track    Care Coordination Interventions: Evaluation of current treatment plan related to type 2 diabetes and patient's adherence to plan as established by provider Determined patient completed an in person visit with Emelda Fear D to establish with the [facility] program Discussed patient is finding his continuous glucose monitoring to be very effective in the way of showing him how his diabetes is affected by his lifestyle  Determined patient has enrolled with the PREP program and has started his visits Positive reinforcement given to patient for making efforts to improve his diabetes and overall health  Reviewed and discussed patient's upcoming scheduled follow up with Endocrinology set for 02/09/24 @2 :40 PM Discussed plans with patient for ongoing care coordination follow up and provided patient with direct contact information for nurse care coordinator Patient will continue to adhere to his diabetes treatment plan as prescribed Patient will report abnormal glucose readings to his doctor  Patient will continue to implement exercise in his daily routine and continue to participate in the PREP program Patient will keep all scheduled medical appointments as directed Patient will continue to work with [facility] nurse case manager with next scheduled follow set for 02/12/24 @3 :30 PM  Assessed patient's understanding of A1c goal: <7.0% Lab Results  Component Value Date   HGBA1C 8.5 (A) 10/09/2023            Our next appointment is by telephone on 02/12/24 at 3:30 PM  Please call the care guide team at [phone] if you need to cancel or reschedule your appointment.   If you are experiencing a Mental Health  or Behavioral Health Crisis or need someone to talk to, please call 1-800-273-TALK (toll free, 24 hour hotline)  Patient verbalizes understanding of instructions and care plan provided today and agrees to view in MyChart. Active MyChart status and patient understanding of how to access instructions and care plan via MyChart confirmed with patient.     Delsa Sale RN BSN CCM [place]  [facility], [facility] Nurse Care Coordinator  Direct Dial: [phone] Website: [email or web address]

## 2023-11-16 NOTE — Progress Notes (Signed)
[facility] PREP Weekly Session  Patient Details  Name: Bradley [name] MRN: [id number] Date of Birth: 28-Feb-1981 Age: 43 y.o. PCP: Hoy Register, MD  Vitals:   11/16/23 1300  Weight: (!) 306 lb 12.8 oz (139.2 kg)     [facility] Weekly seesion - 11/16/23 1500       [facility] "PREP" Location   [facility] "PREP" Location [facility]      Weekly Session   Topic Discussed Other ways to be active;Importance of resistance training   Discussed components of a balanced fitness plan, benefits of cardio and strength training, national standards 150 mins/week of cardio, strength training 2-4 days/week for 20-40 minutes per session.   Minutes exercised this week 480 minutes    Classes attended to date 3             Remo Lipps 11/16/2023, 5:03 PM

## 2023-11-16 NOTE — Patient Outreach (Signed)
  Care Coordination   Follow Up Visit Note   11/16/2023 Name: [name] MRN: [id number] DOB: July 29, 1981  Bradley [name] is a 43 y.o. year old male who sees Hoy Register, MD for primary care. I spoke with  Bradley [name] by phone today.  What matters to the patients health and wellness today?  Patient would like to improve self-health management of his diabetes.     Goals Addressed             This Visit's Progress    Effective management of DM   On track    Care Coordination Interventions: Evaluation of current treatment plan related to type 2 diabetes and patient's adherence to plan as established by provider Determined patient completed an in person visit with Emelda Fear D to establish with the [facility] program Discussed patient is finding his continuous glucose monitoring to be very effective in the way of showing him how his diabetes is affected by his lifestyle  Determined patient has enrolled with the PREP program and has started his visits Positive reinforcement given to patient for making efforts to improve his diabetes and overall health  Reviewed and discussed patient's upcoming scheduled follow up with Endocrinology set for 02/09/24 @2 :40 PM Discussed plans with patient for ongoing care coordination follow up and provided patient with direct contact information for nurse care coordinator Patient will continue to adhere to his diabetes treatment plan as prescribed Patient will report abnormal glucose readings to his doctor  Patient will continue to implement exercise in his daily routine and continue to participate in the PREP program Patient will keep all scheduled medical appointments as directed Patient will continue to work with [facility] nurse case manager with next scheduled follow set for 02/12/24 @3 :30 PM  Assessed patient's understanding of A1c goal: <7.0% Lab Results  Component Value Date   HGBA1C 8.5 (A) 10/09/2023     Interventions Today     Flowsheet Row Most Recent Value  Chronic Disease   Chronic disease during today's visit Diabetes  General Interventions   General Interventions Discussed/Reviewed General Interventions Discussed, General Interventions Reviewed, Labs, Doctor Visits, Durable Medical Equipment (DME)  Doctor Visits Discussed/Reviewed PCP, Doctor Visits Reviewed, Doctor Visits Discussed, Specialist  Durable Medical Equipment (DME) Glucomoter  Exercise Interventions   Exercise Discussed/Reviewed Physical Activity, Exercise Reviewed, Exercise Discussed  Physical Activity Discussed/Reviewed Physical Activity Discussed, Physical Activity Reviewed, PREP  Education Interventions   Education Provided Provided Education  Provided Verbal Education On When to see the doctor, Exercise, Blood Sugar Monitoring         SDOH assessments and interventions completed:  No     Care Coordination Interventions:  Yes, provided   Follow up plan: Follow up call scheduled for 02/12/24 @3 :30 PM    Encounter Outcome:  Patient Visit Completed

## 2023-11-23 ENCOUNTER — Encounter: Payer: Self-pay | Admitting: *Deleted

## 2023-11-23 NOTE — Progress Notes (Signed)
 [facility] PRE[facility] Weekly Session  Patient Details  Name: Bradley [name] MRN: [id number] Date of Birth: 06/22/1981 Age: 43 y.o. PCP: Hoy Register, MD  Vitals:   11/23/23 1300  Weight: (!) 307 lb 12.8 oz (139.6 kg)     [facility] Weekly seesion - 11/23/23 1500       [facility] "PREP" Location   [facility] "PREP" Location [facility]      Weekly Session   Topic Discussed Healthy eating tips   Healthy carbohydrates, fats and proteins. Keeping added sugars to 24g/day for women, 36g/day for men. Sodium to 1500-2400mg /day.   Minutes exercised this week 210 minutes    Classes attended to date 4             Remo Lipps 11/23/2023, 7:42 PM

## 2023-11-30 ENCOUNTER — Encounter: Payer: Self-pay | Admitting: *Deleted

## 2023-11-30 ENCOUNTER — Other Ambulatory Visit: Payer: Self-pay

## 2023-11-30 NOTE — Progress Notes (Signed)
 [facility] PREP Weekly Session  Patient Details  Name: Bradley [name] MRN: [id number] Date of Birth: 06/20/1981 Age: 43 y.o. PCP: Hoy Register, MD  Vitals:   11/30/23 1300  Weight: (!) 311 lb 12.8 oz (141.4 kg)     [facility] Weekly seesion - 11/30/23 1500       [facility] "PREP" Location   [facility] "PREP" Location [facility]      Weekly Session   Topic Discussed Health habits;Water   Staying motivted, addicting nature of sugar, tips for sugar reduction, liquid sugar, hidden sugar, sugar demo   Minutes exercised this week 560 minutes    Classes attended to date 6             Remo Lipps 11/30/2023, 7:53 PM

## 2023-12-04 ENCOUNTER — Other Ambulatory Visit: Payer: Self-pay

## 2023-12-04 ENCOUNTER — Other Ambulatory Visit: Payer: Self-pay | Admitting: Pharmacist

## 2023-12-04 MED ORDER — WARFARIN SODIUM 2.5 MG PO TABS
2.5000 mg | ORAL_TABLET | Freq: Every day | ORAL | 1 refills | Status: DC
Start: 2023-12-04 — End: 2024-05-31
  Filled 2023-12-04: qty 90, 90d supply, fill #0
  Filled 2024-03-01: qty 90, 90d supply, fill #1

## 2023-12-07 ENCOUNTER — Encounter: Payer: Self-pay | Admitting: *Deleted

## 2023-12-07 NOTE — Progress Notes (Signed)
 [facility]  Patient Details  Name: KEYWON MESTRE MRN: [id number] Date of Birth: 1981/08/20 Age: 43 y.o. PCP: Hoy Register, MD  Vitals:   12/07/23 1433  Weight: (!) 310 lb 9.6 oz (140.9 kg)     [facility] Weekly seesion - 12/07/23 1400       [facility]PREP" Location   [facility]      Weekly Session   Topic Discussed Restaurant Eating   Sodium intake 1500-2300mg /day. Effects of sodium on your body, salt demo   Minutes exercised this week 410 minutes    Classes attended to date 7             Remo Lipps 12/07/2023, 2:34 PM

## 2023-12-08 ENCOUNTER — Other Ambulatory Visit: Payer: Self-pay

## 2023-12-08 ENCOUNTER — Ambulatory Visit: Payer: Medicare HMO | Attending: Family Medicine | Admitting: Pharmacist

## 2023-12-08 DIAGNOSIS — I825Y2 Chronic embolism and thrombosis of unspecified deep veins of left proximal lower extremity: Secondary | ICD-10-CM | POA: Diagnosis not present

## 2023-12-08 LAB — POCT INR: POC INR: 2.6

## 2023-12-08 MED ORDER — GLIMEPIRIDE 4 MG PO TABS
8.0000 mg | ORAL_TABLET | Freq: Every day | ORAL | 1 refills | Status: DC
  Filled 2023-12-08 – 2023-12-27 (×3): qty 180, 90d supply, fill #0

## 2023-12-15 ENCOUNTER — Ambulatory Visit (INDEPENDENT_AMBULATORY_CARE_PROVIDER_SITE_OTHER): Payer: Medicare HMO | Admitting: Podiatry

## 2023-12-15 ENCOUNTER — Encounter: Payer: Self-pay | Admitting: Podiatry

## 2023-12-15 VITALS — Ht 72.0 in | Wt 310.6 lb

## 2023-12-15 DIAGNOSIS — M2142 Flat foot [pes planus] (acquired), left foot: Secondary | ICD-10-CM | POA: Diagnosis not present

## 2023-12-15 DIAGNOSIS — E1142 Type 2 diabetes mellitus with diabetic polyneuropathy: Secondary | ICD-10-CM

## 2023-12-15 DIAGNOSIS — M2141 Flat foot [pes planus] (acquired), right foot: Secondary | ICD-10-CM | POA: Diagnosis not present

## 2023-12-15 DIAGNOSIS — M79674 Pain in right toe(s): Secondary | ICD-10-CM

## 2023-12-15 DIAGNOSIS — B351 Tinea unguium: Secondary | ICD-10-CM

## 2023-12-15 DIAGNOSIS — M79675 Pain in left toe(s): Secondary | ICD-10-CM | POA: Diagnosis not present

## 2023-12-15 DIAGNOSIS — E119 Type 2 diabetes mellitus without complications: Secondary | ICD-10-CM | POA: Diagnosis not present

## 2023-12-15 DIAGNOSIS — L84 Corns and callosities: Secondary | ICD-10-CM

## 2023-12-15 NOTE — Progress Notes (Signed)
 ANNUAL DIABETIC FOOT EXAM  Subjective: Bradley [name] presents today for annual diabetic foot exam. Patient states he continues to walk barefoot in his home. Chief Complaint  Patient presents with   Nail Problem    P[t is here for [facility] A1C was 8.8 PCP is Dr Alvis Lemmings and LOV was in January.   Patient confirms h/o diabetes.  Patient denies any h/o foot wounds.  Patient has been diagnosed with neuropathy.  Bradley Register, Bradley [name] is patient's PCP.  Past Medical History:  Diagnosis Date   ADHD    ADHD (attention deficit hyperactivity disorder)    Bipolar 1 disorder (HCC)    Bipolar disorder (HCC)    Depression    Diabetes mellitus without complication (HCC)    Hyperlipidemia    Morbid obesity (HCC)    Obesity    Panic attack    Varicose veins of both lower extremities with pain    Patient Active Problem List   Diagnosis Date Noted   Hyperglycemia 03/19/2021   Polycythemia 12/17/2020   Hypertriglyceridemia 10/18/2020   Pain due to onychomycosis of toenails of both feet 09/28/2019   Diabetes mellitus without complication (HCC) 09/28/2019   Venous stasis dermatitis of left lower extremity 09/28/2019   Pulmonary emboli (HCC) 11/27/2018   Splenomegaly 11/27/2018   Focal nodular hyperplasia of liver 11/27/2018   Diabetic neuropathy (HCC) 05/12/2018   Chronic pain syndrome 05/20/2017   Bipolar disorder, current episode mixed, moderate (HCC) 03/04/2017   Varicose veins of both lower extremities with pain 02/18/2017   Chronic deep vein thrombosis (DVT) of proximal vein of left lower extremity (HCC) 11/12/2016   Smoking addiction 06/19/2016   HTN (hypertension) 07/27/2013   Morbid obesity (HCC) 07/27/2013   Bipolar 1 disorder, mixed, moderate (HCC) 07/27/2013   DM2 (diabetes mellitus, type 2) (HCC) 04/26/2013   Past Surgical History:  Procedure Laterality Date   surgery on meatus as a child     Current Outpatient Medications on File Prior to Visit  Medication Sig Dispense  Refill   ARIPiprazole (ABILIFY) 5 MG tablet Take 1 tablet (5 mg total) by mouth at bedtime. 30 tablet 2   atorvastatin (LIPITOR) 20 MG tablet Take 1 tablet (20 mg total) by mouth daily. 90 tablet 3   Blood Glucose Calibration (ACCU-CHEK ACTIVE GLUCOSE CONT VI) by In Vitro route.     doxepin (SINEQUAN) 25 MG capsule Take 25 mg by mouth at bedtime as needed.     doxepin (SINEQUAN) 50 MG capsule Take 1 capsule (50 mg total) by mouth as needed for sleep 30 capsule 2   DULoxetine (CYMBALTA) 30 MG capsule Take 30 mg by mouth daily.     DULoxetine (CYMBALTA) 60 MG capsule Take 1 capsule(s) by mouth at bedtime for anxiety/depression 30 capsule 2   empagliflozin (JARDIANCE) 25 MG TABS tablet Take 1 tablet (25 mg total) by mouth daily before breakfast. 90 tablet 3   gentamicin cream (GARAMYCIN) 0.1 % Apply to wound left lower extremity once daily until healed. 30 g 1   glimepiride (AMARYL) 4 MG tablet Take 2 tablets (8 mg total) by mouth daily before breakfast. 180 tablet 1   glucose blood (ACCU-CHEK GUIDE) test strip 1 each by Other route 3 (three) times daily. Use as instructed 300 each 3   mupirocin ointment (BACTROBAN) 2 % Apply to right great toe once daily. 30 g 1   OneTouch Delica Lancets 33G MISC 1 Device by Does not apply route 3 (three) times daily. 300 each 3  sildenafil (VIAGRA) 50 MG tablet Take 1 tablet (50 mg total) by mouth daily as needed for erectile dysfunction. At least 24 hours between doses 10 tablet 1   traMADol (ULTRAM) 50 MG tablet Take 1 tablet (50 mg total) by mouth every 12 (twelve) hours as needed. 60 tablet 4   warfarin (COUMADIN) 2.5 MG tablet TAKE 1 TABLET BY MOUTH DAILY AS DIRECTED BY THE COUMADIN CLINIC. 90 tablet 1   No current facility-administered medications on file prior to visit.    Allergies  Allergen Reactions   Actos [Pioglitazone] Other (See Comments)    Stomach cramps   Gabapentin Other (See Comments)    Crying spells   Lyrica [Pregabalin] Other (See  Comments)    Makes the patient somnolent   Social History   Occupational History   Not on file  Tobacco Use   Smoking status: Every Day    Current packs/day: 1.00    Average packs/day: 1 pack/day for 15.0 years (15.0 ttl pk-yrs)    Types: Cigarettes   Smokeless tobacco: Never  Vaping Use   Vaping status: Never Used  Substance and Sexual Activity   Alcohol use: Yes    Alcohol/week: 1.0 standard drink of alcohol    Types: 1 Shots of liquor per week    Comment: Only on my birthday and New Year's   Drug use: No   Sexual activity: Not Currently    Birth control/protection: Abstinence   Family History  Problem Relation Age of Onset   Rheum arthritis Mother    Diabetes Mother    Heart failure Mother    Stroke Mother    ADD / ADHD Mother    Anxiety disorder Mother    Arthritis Mother    Depression Mother    Drug abuse Mother    Alcohol abuse Father    Glaucoma Maternal Grandmother    COPD Maternal Grandmother    Lung cancer Paternal Grandmother        smoker   Immunization History  Administered Date(s) Administered   Influenza,inj,Quad PF,6+ Mos 06/22/2019   Pneumococcal Polysaccharide-23 08/12/2018     Review of Systems: Negative except as noted in the HPI.   Objective: There were no vitals filed for this visit.  Bradley [name] is a pleasant 43 y.o. male in NAD. AAO X 3.  Diabetic foot exam was performed with the following findings:   Vascular Examination: Capillary refill time immediate b/l. Vascular status intact b/l with palpable pedal pulses. Pedal hair absent b/l. No pain with calf compression b/l. Skin temperature gradient WNL b/l. No cyanosis or clubbing b/l. No ischemia or gangrene noted b/l. Trace edema noted BLE. Varicosities present b/l. Evidence of skin changes consistent with long term venous stasis BLE.  Neurological Examination: Sensation grossly intact b/l with 10 gram monofilament. Vibratory sensation intact b/l. Pt has subjective symptoms of  neuropathy.  Dermatological Examination: Pedal skin with normal turgor, texture and tone b/l.  No open wounds. No interdigital macerations.   Toenails 1-5 b/l thick, discolored, elongated with subungual debris and pain on dorsal palpation. .  Evidence of poor pedal hygiene on plantar aspect of both feet.  Musculoskeletal Examination: Muscle strength 5/5 to all lower extremity muscle groups bilaterally. No pain, crepitus or joint limitation noted with ROM bilateral LE. No gross bony deformities bilaterally.  Radiographs: None     Lab Results  Component Value Date   HGBA1C 8.6 (A) 11/09/2023    ADA Risk Categorization: Low Risk :  Patient has  all of the following: Intact protective sensation No prior foot ulcer  No severe deformity Pedal pulses present  Assessment: 1. Pain due to onychomycosis of toenails of both feet   2. Callus   3. Pes planus of both feet   4. Diabetic peripheral neuropathy associated with type 2 diabetes mellitus (HCC)   5. Encounter for diabetic foot exam [facility])     Plan: Consent given for treatment. Patient examined. Counseled patient on pedal hygiene. H All patient's and/or POA's questions/concerns addressed on today's visit. Toenails 1-5 debrided in length and girth without incident. Continue foot and shoe inspections daily. Monitor blood glucose per PCP/Endocrinologist's recommendations. Continue soft, supportive shoe gear daily. Report any pedal injuries to medical professional. Call office if there are any questions/concerns. Return in about 3 months (around 03/16/2024).  Freddie Breech, DPM      [place] LOCATION: [address], [place] [address]                   Office [phone]   [facility] LOCATION: [address] [place], [place] [address] Office [phone]

## 2023-12-17 ENCOUNTER — Other Ambulatory Visit: Payer: Self-pay

## 2023-12-21 ENCOUNTER — Encounter: Payer: Self-pay | Admitting: *Deleted

## 2023-12-21 DIAGNOSIS — F4312 Post-traumatic stress disorder, chronic: Secondary | ICD-10-CM | POA: Diagnosis not present

## 2023-12-21 DIAGNOSIS — F3132 Bipolar disorder, current episode depressed, moderate: Secondary | ICD-10-CM | POA: Diagnosis not present

## 2023-12-21 DIAGNOSIS — F411 Generalized anxiety disorder: Secondary | ICD-10-CM | POA: Diagnosis not present

## 2023-12-21 NOTE — Progress Notes (Signed)
 [facility]  Patient Details  Name: Bradley [name] MRN: [id number] Date of Birth: 1980-12-01 Age: 43 y.o. PCP: Hoy Register, MD  Vitals:   12/21/23 1442  Weight: (!) 308 lb 11.2 oz (140 kg)     [facility] Weekly seesion - 12/21/23 1400       [facility] "PREP" Location   [facility]      Weekly Session   Topic Discussed Expectations and non-scale victories   Halfway through! Restate and reset goals. Permanent objectives. Discuss Blue zones.   Minutes exercised this week 130 minutes    Classes attended to date 10             Remo Lipps 12/21/2023, 2:43 PM

## 2023-12-28 ENCOUNTER — Encounter: Payer: Self-pay | Admitting: *Deleted

## 2023-12-28 ENCOUNTER — Other Ambulatory Visit: Payer: Self-pay

## 2023-12-28 NOTE — Progress Notes (Signed)
 [facility] Weekly Session  Patient Details  Name: EARON RIVEST MRN: [id number] Date of Birth: 1981/07/14 Age: 43 y.o. PCP: Hoy Register, MD  Vitals:   12/28/23 1428  Weight: (!) 306 lb 3.2 oz (138.9 kg)     [facility] Weekly seesion - 12/28/23 1400       [facility] "PREP" Location   [facility]      Weekly Session   Topic Discussed Other   Portion control, confusing and deceptive  labeling   Classes attended to date 96             Remo Lipps 12/28/2023, 2:31 PM

## 2024-01-04 ENCOUNTER — Encounter: Payer: Self-pay | Admitting: *Deleted

## 2024-01-05 NOTE — Progress Notes (Signed)
 [facility] PRE[facility] Weekly Session  Patient Details  Name: Bradley [name] MRN: [id number] Date of Birth: 05/29/1981 Age: 43 y.o. PCP: Hoy Register, MD  Vitals:   01/05/24 0809  Weight: (!) 310 lb 9.6 oz (140.9 kg)     [facility] Weekly seesion - 01/04/24 1400       [facility] "PREP" Location   [facility]      Weekly Session   Topic Discussed Finding support   Accountabitiy partners, tips for staying on track, identifying non supportive people.   Minutes exercised this week 180 minutes    Classes attended to date 102             Remo Lipps 01/05/2024, 8:11 AM

## 2024-01-08 ENCOUNTER — Ambulatory Visit: Attending: Family Medicine | Admitting: Pharmacist

## 2024-01-08 DIAGNOSIS — I825Y2 Chronic embolism and thrombosis of unspecified deep veins of left proximal lower extremity: Secondary | ICD-10-CM

## 2024-01-08 LAB — POCT INR: POC INR: 2.9

## 2024-01-14 ENCOUNTER — Telehealth: Payer: Self-pay

## 2024-01-14 NOTE — Patient Outreach (Signed)
 Complex Care Management Care Guide Note  01/14/2024 Name: [name] MRN: [id number] DOB: 12-Dec-1980  Bradley [name] is a 43 y.o. year old male who is a primary care patient of Newlin, Enobong, MD and is actively engaged with the care management team. I reached out to Bradley [name] by phone today to assist with re-scheduling  with the RN Case Manager.  Follow up plan: Unsuccessful telephone outreach attempt made. A HIPAA compliant phone message was left for the patient providing contact information and requesting a return call.  Carter Clare, BSW, CDP [place]  [facility] - [facility] Manager Population Health Direct Dial: [phone]  Fax: [phone]

## 2024-01-15 ENCOUNTER — Telehealth: Payer: Self-pay | Admitting: *Deleted

## 2024-01-15 NOTE — Progress Notes (Signed)
 Complex Care Management Care Guide Note  01/15/2024 Name: Bradley [name] MRN: [id number] DOB: 24-Aug-1981  Bradley [name] is a 43 y.o. year old male who is a primary care patient of Newlin, Enobong, MD and is actively engaged with the care management team. I reached out to Bradley [name] by phone today to assist with re-scheduling  with the RN Case Manager.  Follow up plan: Telephone appointment with complex care management team member scheduled for:  5/21  Bradley [name]  [facility] Health  Value-Based Care Institute, [facility] Guide  Direct Dial: [phone]  Fax [phone]

## 2024-01-15 NOTE — Progress Notes (Signed)
 Complex Care Management Care Guide Note  01/15/2024 Name: Bradley [name] MRN: [id number] DOB: March 11, 1981  Bradley [name] is a 43 y.o. year old male who is a primary care patient of Newlin, Enobong, MD and is actively engaged with the care management team. I reached out to Bradley [name] by phone today to assist with re-scheduling  with the RN Case Manager.  Follow up plan: Unsuccessful telephone outreach attempt made. A HIPAA compliant phone message was left for the patient providing contact information and requesting a return call.  [name]  [facility] Health  Value-Based Care Institute, [facility] Guide  Direct Dial: [phone]  Fax [phone]

## 2024-01-19 ENCOUNTER — Encounter: Payer: Self-pay | Admitting: *Deleted

## 2024-01-19 NOTE — Progress Notes (Signed)
 [facility] Weekly Session  Patient Details  Name: Bradley [name] MRN: [id number] Date of Birth: Jul 27, 1981 Age: 43 y.o. PCP: Joaquin Mulberry, MD  Vitals:   01/18/24 1232  Weight: (!) 312 lb (141.5 kg)     [facility] Weekly seesion - 01/18/24 1235       [facility] "PREP" Location   [facility]PREP[facility]      Weekly Session   Topic Discussed Calorie breakdown;Hitting roadblocks    Classes attended to date 52             Howard Macho 01/19/2024, 12:38 PM  [facility] PREP Weekly Session  Patient Details  Name: Bradley [name] MRN: [id number] Date of Birth: Dec 17, 1980 Age: 43 y.o. PCP: Joaquin Mulberry, MD  Vitals:   01/18/24 1232  Weight: (!) 312 lb (141.5 kg)     [facility] Weekly seesion - 01/18/24 1235       [facility] "PREP" Location   [facility]PREP[facility]      Weekly Session   Topic Discussed Calorie breakdown;Hitting roadblocks    Classes attended to date 39             Howard Macho 01/19/2024, 12:38 PM  [facility] PREP Weekly Session  Patient Details  Name: Bradley [name] MRN: [id number] Date of Birth: 1981/08/20 Age: 43 y.o. PCP: Joaquin Mulberry, MD  Vitals:   01/18/24 1232  Weight: (!) 312 lb (141.5 kg)     [facility] Weekly seesion - 01/18/24 1235       [facility] "PREP" Location   [facility]      Weekly Session   Topic Discussed Calorie breakdown;Hitting roadblocks    Classes attended to date 66             Howard Macho 01/19/2024, 12:38 PM

## 2024-01-22 ENCOUNTER — Other Ambulatory Visit: Payer: Self-pay | Admitting: Pharmacist

## 2024-01-22 DIAGNOSIS — E1149 Type 2 diabetes mellitus with other diabetic neurological complication: Secondary | ICD-10-CM

## 2024-01-22 NOTE — Telephone Encounter (Signed)
 Patient is requesting tramadol  refill. Acknowledges that he still has ~1 week left of medication (last dispensed a 30-day supply 12/28/2023). However, he wonders if Dr. Newlin will send a rxn proactively for when he runs out.

## 2024-01-25 ENCOUNTER — Encounter: Payer: Self-pay | Admitting: *Deleted

## 2024-01-25 ENCOUNTER — Other Ambulatory Visit: Payer: Self-pay

## 2024-01-25 MED ORDER — TRAMADOL HCL 50 MG PO TABS
50.0000 mg | ORAL_TABLET | Freq: Two times a day (BID) | ORAL | 4 refills | Status: DC | PRN
Start: 2024-01-25 — End: 2024-06-13
  Filled 2024-01-25: qty 60, 30d supply, fill #0
  Filled 2024-02-22 – 2024-02-23 (×2): qty 60, 30d supply, fill #1
  Filled 2024-03-21 – 2024-03-22 (×2): qty 60, 30d supply, fill #2
  Filled 2024-04-17 – 2024-04-19 (×2): qty 60, 30d supply, fill #3
  Filled 2024-05-16: qty 60, 30d supply, fill #4

## 2024-01-25 NOTE — Progress Notes (Signed)
 [facility] Weekly Session  Patient Details  Name: Bradley [name] MRN: [id number] Date of Birth: 01/12/1981 Age: 43 y.o. PCP: Joaquin Mulberry, MD  Vitals:   01/25/24 1430  Weight: (!) 308 lb 12.8 oz (140.1 kg)     [facility] Weekly seesion - 01/25/24 1500       [facility] "PREP" Location   [facility]      Weekly Session   Topic Discussed Other   Final FIT tesing, Fit ans Strong survey, Questions and Anwers   Minutes exercised this week 175 minutes    Classes attended to date 17             Howard Macho 01/25/2024, 8:45 PM

## 2024-02-01 ENCOUNTER — Encounter: Payer: Self-pay | Admitting: *Deleted

## 2024-02-02 ENCOUNTER — Encounter: Payer: Self-pay | Admitting: Family Medicine

## 2024-02-02 ENCOUNTER — Other Ambulatory Visit: Payer: Self-pay

## 2024-02-02 ENCOUNTER — Ambulatory Visit: Payer: Medicare HMO | Attending: Family Medicine | Admitting: Family Medicine

## 2024-02-02 VITALS — BP 102/67 | HR 91 | Ht 72.0 in | Wt 312.0 lb

## 2024-02-02 DIAGNOSIS — I825Y2 Chronic embolism and thrombosis of unspecified deep veins of left proximal lower extremity: Secondary | ICD-10-CM | POA: Diagnosis not present

## 2024-02-02 DIAGNOSIS — F3162 Bipolar disorder, current episode mixed, moderate: Secondary | ICD-10-CM

## 2024-02-02 DIAGNOSIS — E1165 Type 2 diabetes mellitus with hyperglycemia: Secondary | ICD-10-CM | POA: Diagnosis not present

## 2024-02-02 DIAGNOSIS — Z794 Long term (current) use of insulin: Secondary | ICD-10-CM | POA: Diagnosis not present

## 2024-02-02 DIAGNOSIS — R062 Wheezing: Secondary | ICD-10-CM | POA: Diagnosis not present

## 2024-02-02 DIAGNOSIS — E1149 Type 2 diabetes mellitus with other diabetic neurological complication: Secondary | ICD-10-CM

## 2024-02-02 DIAGNOSIS — F17210 Nicotine dependence, cigarettes, uncomplicated: Secondary | ICD-10-CM | POA: Diagnosis not present

## 2024-02-02 DIAGNOSIS — E781 Pure hyperglyceridemia: Secondary | ICD-10-CM

## 2024-02-02 MED ORDER — ATORVASTATIN CALCIUM 20 MG PO TABS
20.0000 mg | ORAL_TABLET | Freq: Every day | ORAL | 3 refills | Status: AC
  Filled 2024-02-02 – 2024-03-22 (×4): qty 90, 90d supply, fill #0
  Filled 2024-06-20: qty 90, 90d supply, fill #1
  Filled 2024-09-20: qty 90, 90d supply, fill #2

## 2024-02-02 NOTE — Progress Notes (Signed)
 Subjective:  Patient ID: Bradley [name], male    DOB: [birth year]  Age: 43 y.o. MRN: [id number]  CC: Medical Management of Chronic Issues     Discussed the use of AI scribe software for clinical note transcription with the patient, who gave verbal consent to proceed.  History of Present Illness Bradley [name] is a 43 year old male with a history of morbid obesity, type 2 diabetes mellitus (A1c 9.4 managed by endocrine, non compliance,diabetic neuropathy (unable to tolerate gabapentin , Lyrica ), Bipolar disorder (he goes to [facility]) , previous DVT in 2017, PE (diagnosed in 10/2018 - on chronic anticoagulation with Coumadin )  who presents for routine follow-up.  He manages diabetes with a continuous glucose monitor and takes glimepiride  8 mg, Jardiance  with no hypoglycemic episodes. His last A1c was 8.6. He is scheduled to see his endocrinologist next week. For neuropathy, he takes tramadol , with the last dose at 8:30 this morning, and Cymbalta , totaling 90 mg daily.  He takes atorvastatin  for cholesterol management and will need a refill by the end of the month. He is on Coumadin  for recurring thromboembolism and has a bruise from a prep class exercise about a week and a half ago, without known trauma.  He smokes half a pack per day and is attempting to quit without nicotine  replacement therapies due to insurance issues. He previously tried Wellbutrin but had adverse mental effects.  He engages in physical activity by walking around his apartment and a nearby park for about half an hour once a week and going to the gym for an hour once a week, attributed to a program called [employer].  He experiences wheezing, particularly during allergy season, but does not use an inhaler. No shortness of breath, nasal congestion, low blood sugars, or ankle swelling.    Past Medical History:  Diagnosis Date   ADHD    ADHD (attention deficit hyperactivity disorder)    Bipolar 1  disorder (HCC)    Bipolar disorder (HCC)    Depression    Diabetes mellitus without complication (HCC)    Hyperlipidemia    Morbid obesity (HCC)    Obesity    Panic attack    Varicose veins of both lower extremities with pain     Past Surgical History:  Procedure Laterality Date   surgery on meatus as a child      Family History  Problem Relation Age of Onset   Rheum arthritis Mother    Diabetes Mother    Heart failure Mother    Stroke Mother    ADD / ADHD Mother    Anxiety disorder Mother    Arthritis Mother    Depression Mother    Drug abuse Mother    Alcohol  abuse Father    Glaucoma Maternal Grandmother    COPD Maternal Grandmother    Lung cancer Paternal Grandmother        smoker    Social History   Socioeconomic History   Marital status: Single    Spouse name: Not on file   Number of children: Not on file   Years of education: Not on file   Highest education level: Some college, no degree  Occupational History   Not on file  Tobacco Use   Smoking status: Every Day    Current packs/day: 1.00    Average packs/day: 1 pack/day for 15.0 years (15.0 ttl pk-yrs)    Types: Cigarettes   Smokeless tobacco: Never  Vaping Use   Vaping status: Never  Used  Substance and Sexual Activity   Alcohol  use: Yes    Alcohol /week: 1.0 standard drink of alcohol     Types: 1 Shots of liquor per week    Comment: Only on my birthday and New Year's   Drug use: No   Sexual activity: Not Currently    Birth control/protection: Abstinence  Other Topics Concern   Not on file  Social History Narrative   ** Merged History Encounter **       Social Drivers of Health   Financial Resource Strain: Low Risk  (10/06/2023)   Overall Financial Resource Strain (CARDIA)    Difficulty of Paying Living Expenses: Not hard at all  Food Insecurity: No Food Insecurity (10/06/2023)   Hunger Vital Sign    Worried About Running Out of Food in the Last Year: Never true    Ran Out of Food in the  Last Year: Never true  Transportation Needs: No Transportation Needs (10/06/2023)   PRAPARE - Administrator, Civil Service (Medical): No    Lack of Transportation (Non-Medical): No  Physical Activity: Inactive (10/06/2023)   Exercise Vital Sign    Days of Exercise per Week: 0 days    Minutes of Exercise per Session: 0 min  Stress: Stress Concern Present (10/06/2023)   [employer] of Occupational Health - Occupational Stress Questionnaire    Feeling of Stress : To some extent  Social Connections: Moderately Isolated (10/06/2023)   Social Connection and Isolation Panel [NHANES]    Frequency of Communication with Friends and Family: Three times a week    Frequency of Social Gatherings with Friends and Family: Once a week    Attends Religious Services: Never    Database administrator or Organizations: No    Attends Engineer, structural: Never    Marital Status: Living with partner    Allergies  Allergen Reactions   Actos  [Pioglitazone ] Other (See Comments)    Stomach cramps   Gabapentin  Other (See Comments)    Crying spells   Lyrica  [Pregabalin ] Other (See Comments)    Makes the patient somnolent    Outpatient Medications Prior to Visit  Medication Sig Dispense Refill   ARIPiprazole  (ABILIFY ) 5 MG tablet Take 1 tablet (5 mg total) by mouth at bedtime. 30 tablet 2   atorvastatin  (LIPITOR) 20 MG tablet Take 1 tablet (20 mg total) by mouth daily. 90 tablet 3   Blood Glucose Calibration (ACCU-CHEK ACTIVE GLUCOSE CONT VI) by In Vitro route.     doxepin  (SINEQUAN ) 25 MG capsule Take 25 mg by mouth at bedtime as needed.     doxepin  (SINEQUAN ) 50 MG capsule Take 1 capsule (50 mg total) by mouth as needed for sleep 30 capsule 2   DULoxetine  (CYMBALTA ) 30 MG capsule Take 30 mg by mouth daily.     DULoxetine  (CYMBALTA ) 60 MG capsule Take 1 capsule(s) by mouth at bedtime for anxiety/depression 30 capsule 2   empagliflozin  (JARDIANCE ) 25 MG TABS tablet Take 1 tablet (25 mg  total) by mouth daily before breakfast. 90 tablet 3   glimepiride  (AMARYL ) 4 MG tablet Take 2 tablets (8 mg total) by mouth daily before breakfast. 180 tablet 1   glucose blood (ACCU-CHEK GUIDE) test strip 1 each by Other route 3 (three) times daily. Use as instructed 300 each 3   OneTouch Delica Lancets 33G MISC 1 Device by Does not apply route 3 (three) times daily. 300 each 3   sildenafil  (VIAGRA ) 50 MG tablet Take  1 tablet (50 mg total) by mouth daily as needed for erectile dysfunction. At least 24 hours between doses 10 tablet 1   traMADol  (ULTRAM ) 50 MG tablet Take 1 tablet (50 mg total) by mouth every 12 (twelve) hours as needed. 60 tablet 4   warfarin (COUMADIN ) 2.5 MG tablet TAKE 1 TABLET BY MOUTH DAILY AS DIRECTED BY THE COUMADIN  CLINIC. 90 tablet 1   gentamicin  cream (GARAMYCIN ) 0.1 % Apply to wound left lower extremity once daily until healed. (Patient not taking: Reported on 02/02/2024) 30 g 1   mupirocin  ointment (BACTROBAN ) 2 % Apply to right great toe once daily. (Patient not taking: Reported on 02/02/2024) 30 g 1   No facility-administered medications prior to visit.     ROS Review of Systems  Constitutional:  Negative for activity change and appetite change.  HENT:  Negative for sinus pressure and sore throat.   Respiratory:  Negative for chest tightness, shortness of breath and wheezing.   Cardiovascular:  Negative for chest pain and palpitations.  Gastrointestinal:  Negative for abdominal distention, abdominal pain and constipation.  Genitourinary: Negative.   Musculoskeletal: Negative.   Psychiatric/Behavioral:  Negative for behavioral problems and dysphoric mood.     Objective:  BP 102/67   Pulse 91   Ht 6' (1.829 m)   Wt (!) 312 lb (141.5 kg)   SpO2 96%   BMI 42.31 kg/m      02/02/2024    2:46 PM 02/01/2024   10:49 AM 01/25/2024    2:30 PM  BP/Weight  Systolic BP 102 102   Diastolic BP 67 52   Wt. (Lbs) 312 315 308.8  BMI 42.31 kg/m2 42.72 kg/m2 41.88 kg/m2       Physical Exam Constitutional:      Appearance: He is well-developed. He is obese.  Cardiovascular:     Rate and Rhythm: Normal rate.     Heart sounds: Normal heart sounds. No murmur heard. Pulmonary:     Effort: Pulmonary effort is normal.     Breath sounds: Wheezing present. No rales.  Chest:     Chest wall: No tenderness.  Abdominal:     General: Bowel sounds are normal. There is no distension.     Palpations: Abdomen is soft. There is no mass.     Tenderness: There is no abdominal tenderness.  Musculoskeletal:        General: Normal range of motion.     Right lower leg: No edema.     Left lower leg: No edema.  Neurological:     Mental Status: He is alert and oriented to person, place, and time.  Psychiatric:        Mood and Affect: Mood normal.        Latest Ref Rng & Units 08/05/2023    2:57 PM 02/04/2023    8:41 AM 02/03/2023    2:31 PM  CMP  Glucose 70 - 99 mg/dL 119  147  CANCELED   BUN 6 - 24 mg/dL 13  14  CANCELED   Creatinine 0.76 - 1.27 mg/dL 8.29  5.62  CANCELED   Sodium 134 - 144 mmol/L 139  137  CANCELED   Potassium 3.5 - 5.2 mmol/L 4.5  4.4  CANCELED   Chloride 96 - 106 mmol/L 99  98  CANCELED   CO2 20 - 29 mmol/L 24  21  CANCELED   Calcium  8.7 - 10.2 mg/dL 9.3  9.2  CANCELED   Total Protein 6.0 - 8.5 g/dL  7.2  7.0  CANCELED   Total Bilirubin 0.0 - 1.2 mg/dL 0.9  0.7  CANCELED   Alkaline Phos 44 - 121 IU/L 80  74  CANCELED   AST 0 - 40 IU/L 24  25  CANCELED   ALT 0 - 44 IU/L 33  35  CANCELED     Lipid Panel     Component Value Date/Time   CHOL 130 02/04/2023 0841   TRIG 110 02/04/2023 0841   HDL 48 02/04/2023 0841   CHOLHDL 3.9 12/06/2020 1456   CHOLHDL 4.0 11/12/2016 1613   VLDL 28 11/12/2016 1613   LDLCALC 62 02/04/2023 0841    CBC    Component Value Date/Time   WBC 9.2 09/24/2022 1249   WBC 9.5 04/19/2020 2357   RBC 6.59 (H) 09/24/2022 1249   HGB 19.0 (H) 09/24/2022 1249   HGB 18.9 (H) 12/06/2020 1456   HCT 55.0 (H) 09/24/2022  1249   HCT 57.0 (H) 12/06/2020 1456   PLT 98 (L) 09/24/2022 1249   PLT 120 (L) 12/06/2020 1456   MCV 83.5 09/24/2022 1249   MCV 84 12/06/2020 1456   MCH 28.8 09/24/2022 1249   MCHC 34.5 09/24/2022 1249   RDW 13.9 09/24/2022 1249   RDW 13.1 12/06/2020 1456   LYMPHSABS 2.2 09/24/2022 1249   LYMPHSABS 2.0 12/06/2020 1456   MONOABS 0.6 09/24/2022 1249   EOSABS 0.2 09/24/2022 1249   EOSABS 0.1 12/06/2020 1456   BASOSABS 0.1 09/24/2022 1249   BASOSABS 0.1 12/06/2020 1456    Lab Results  Component Value Date   HGBA1C 8.6 (A) 11/09/2023    Lab Results  Component Value Date   INR 2.9 01/08/2024   INR 2.6 12/08/2023   INR 2.2 11/09/2023    The 10-year ASCVD risk score (Arnett DK, et al., 2019) is: 2.9%   Values used to calculate the score:     Age: 59 years     Sex: Male     Is Non-Hispanic African American: No     Diabetic: Yes     Tobacco smoker: Yes     Systolic Blood Pressure: 102 mmHg     Is BP treated: No     HDL Cholesterol: 48 mg/dL     Total Cholesterol: 130 mg/dL     Assessment & Plan  Type 2 Diabetes Mellitus Uncontrolled Type 2 Diabetes Mellitus managed with glimepiride  and empagliflozin . Last A1c was 8.6. Coordination with endocrinologist is ongoing. - Continue current diabetes medications as prescribed by endocrinologist. - Follow up with endocrinologist for diabetes management. -Counseled on Diabetic diet, my plate method, 098 minutes of moderate intensity exercise/week Blood sugar logs with fasting goals of 80-120 mg/dl, random of less than 119 and in the event of sugars less than 60 mg/dl or greater than 147 mg/dl encouraged to notify the clinic. Advised on the need for annual eye exams, annual foot exams, Pneumonia vaccine.   Peripheral Neuropathy Peripheral neuropathy managed with tramadol  due to intolerance to gabapentin  and Lyrica  - Continue tramadol  as needed for neuropathy pain. - Urine drug screen was in keeping with prescribed medication at  last visit in 07/2023 - PDMP reviewed today   Wheezing Possible COPD Possible COPD due to smoking history and wheezing. Pulmonary function test needed. - Order pulmonary function test to evaluate for COPD.  Tobacco Use Current smoker, reduced to half a pack per day. Prefers to quit without pharmacotherapy. - Encourage smoking cessation efforts. - Discuss potential benefits of smoking cessation aids if interested  in the future.  Recurrent thromboembolism Recurrent blood clots managed with warfarin. No recent bleeding episodes, bruise noted after physical activity. -INR normal at 2.9 - Continue warfarin therapy. - Monitor for signs of bleeding or bruising. - Keep upcoming appointment with clinical pharmacist for INR monitoring   Hypertriglyceridemia - Controlled - Continue statin which is also needed for primary prevention of cardiovascular disease   Bipolar disorder - Management per psych  General Health Maintenance Due for pneumonia vaccine, declined. Blood work needed for cholesterol and other parameters. - Schedule fasting blood work on May 12.       No orders of the defined types were placed in this encounter.   Follow-up: No follow-ups on file.       Joaquin Mulberry, MD, FAAFP. [facility] [place], [place] [phone]   02/02/2024, 3:01 PM

## 2024-02-02 NOTE — Progress Notes (Signed)
 [facility] PREP Evaluation  Patient Details  Name: Bradley [name] MRN: [id number] Date of Birth: [birth year] Age: 43 y.o. PCP: Joaquin Mulberry, MD  Vitals:   02/01/24 1049  BP: (!) 102/52  Pulse: (!) 101  Resp: 20  SpO2: 99%  Weight: (!) 315 lb (142.9 kg)  Height: 6' (1.829 m)     [facility] Eval - 02/01/24 1300       [facility] "PREP" Location   [facility] "PREP" Location [facility]      Referral    Referring Provider [place]    Reason for referral Diabetes;High Cholesterol;Inactivity;Obesitity/Overweight    Program Start Date 11/09/23    Program End Date 02/01/24      Measurement   Waist Circumference 52.75 inches    Waist Circumference End Program 54.25 inches    Hip Circumference 56 inches    Hip Circumference End Program 57 inches    Body fat 41 percent      Mobility and Daily Activities   I find it easy to walk up or down two or more flights of stairs. 1    I have no trouble taking out the trash. 4    I do housework such as vacuuming and dusting on my own without difficulty. 3    I can easily lift a gallon of milk (8lbs). 4    I can easily walk a mile. 1    I have no trouble reaching into high cupboards or reaching down to pick up something from the floor. 3    I do not have trouble doing out-door work such as Loss adjuster, chartered, raking leaves, or gardening. 1      Mobility and Daily Activities   I feel younger than my age. 3    I feel independent. 4    I feel energetic. 2    I live an active life.  2    I feel strong. 3    I feel healthy. 3    I feel active as other people my age. 2      How fit and strong are you.   Fit and Strong Total Score 36            Past Medical History:  Diagnosis Date   ADHD    ADHD (attention deficit hyperactivity disorder)    Bipolar 1 disorder (HCC)    Bipolar disorder (HCC)    Depression    Diabetes mellitus without complication (HCC)    Hyperlipidemia    Morbid obesity (HCC)    Obesity    Panic attack    Varicose veins of  both lower extremities with pain    Past Surgical History:  Procedure Laterality Date   surgery on meatus as a child     Social History   Tobacco Use  Smoking Status Every Day   Current packs/day: 1.00   Average packs/day: 1 pack/day for 15.0 years (15.0 ttl pk-yrs)   Types: Cigarettes  Smokeless Tobacco Never    Bradley [name] 02/02/2024, 10:53 AM  PREP Program complete. Attended 10 of 11 education classes and 7 of 12 exercise sessions. Bradley [name] was engaged in class and was helpful and encouraging to the other participants. . By his own admission, he struggles with motivation. Bradley [name] had improved his exercise tolerance by the very end of the 12 weeks. He would benefit from continuing a structured exercise program. He has a realistic plan in place for exercising at home and nearby availability at the [employer], but  he has to be willing to follow through to improve his health. [place] gained 11.2lbs over the 12 weeks. He is knowledgeable about healthy food choices, but may benefit from further nutritional guidance within his financial limitations.. We discussed the importance of good health practices to improve his longevity, especially  given his young age. Keep up the hard work you've started Bradley [name]!

## 2024-02-02 NOTE — Patient Instructions (Signed)
 VISIT SUMMARY:  You had a routine follow-up appointment today to manage your diabetes, neuropathy, and other health concerns. We discussed your current medications, physical activity, and smoking cessation efforts. You are scheduled to see your endocrinologist next week for further diabetes management.  YOUR PLAN:  -WELLNESS VISIT: This was a routine wellness visit with no major concerns. You are engaging in physical activity and have regular follow-ups with your specialists. Continue your regular follow-ups with your endocrinologist and behavioral health specialist, and keep up with your physical activity.  -TYPE 2 DIABETES MELLITUS: Type 2 Diabetes Mellitus is a condition where your body does not use insulin  properly, leading to high blood sugar levels. Your diabetes is managed with glimepiride  and empagliflozin , and your last A1c was 8.6. Continue taking your diabetes medications as prescribed and follow up with your endocrinologist for ongoing management.  -PERIPHERAL NEUROPATHY: Peripheral neuropathy is a condition that causes pain and numbness in your extremities due to nerve damage. You are managing this with tramadol . Continue taking tramadol  as needed for your neuropathy pain.  -POSSIBLE COPD: Chronic Obstructive Pulmonary Disease (COPD) is a lung condition that can cause breathing difficulties. Due to your smoking history and wheezing, we need to evaluate you for COPD. A pulmonary function test has been ordered to assess your lung function.  -TOBACCO USE: You are currently smoking half a pack per day and are trying to quit without using nicotine  replacement therapies. Continue your efforts to quit smoking, and we can discuss the potential benefits of smoking cessation aids if you are interested in the future.  -RECURRENT BLOOD CLOTS: You have a history of recurrent blood clots and are managing this with warfarin. There have been no recent bleeding episodes, but you have a bruise from  physical activity. Continue your warfarin therapy and monitor for any signs of bleeding or bruising.  -GENERAL HEALTH MAINTENANCE: You are due for a pneumonia vaccine, which you declined today. You also need blood work to check your cholesterol and other parameters. Schedule your fasting blood work on May 12, and we can discuss the pneumonia vaccine at future visits.  INSTRUCTIONS:  Please follow up with your endocrinologist for diabetes management as scheduled. Schedule your fasting blood work on May 12. Continue your current medications and monitor for any signs of bleeding or bruising. If you experience any new symptoms or have concerns, please contact our office.

## 2024-02-08 ENCOUNTER — Ambulatory Visit: Attending: Family Medicine | Admitting: Pharmacist

## 2024-02-08 ENCOUNTER — Other Ambulatory Visit: Payer: Self-pay

## 2024-02-08 DIAGNOSIS — E1165 Type 2 diabetes mellitus with hyperglycemia: Secondary | ICD-10-CM | POA: Diagnosis not present

## 2024-02-08 DIAGNOSIS — Z7984 Long term (current) use of oral hypoglycemic drugs: Secondary | ICD-10-CM

## 2024-02-08 DIAGNOSIS — I825Y2 Chronic embolism and thrombosis of unspecified deep veins of left proximal lower extremity: Secondary | ICD-10-CM

## 2024-02-08 LAB — POCT GLYCOSYLATED HEMOGLOBIN (HGB A1C): HbA1c, POC (controlled diabetic range): 8.6 % — AB (ref 0.0–7.0)

## 2024-02-08 LAB — POCT INR
INR: 2.8 (ref 2.0–3.0)
POC INR: 2.8

## 2024-02-08 NOTE — Progress Notes (Signed)
 S:     Chief Complaint  Patient presents with   Anticoagulation   Diabetes   43 y.o. male who presents for diabetes evaluation, education, and management in the context of the LIBERATE Study. This is his 3 month study visit.  PMH is significant for T2DM complicated by neuropathy, HTN, chronic VTE (anticoagulated on Warfarin), bipolar I, morbid obesity, tobacco use.  Patient was referred and last seen by Primary Care Provider, Dr. Adan Holms, on 02/02/2024. His DM is managed by his Endocrinologist, Dr. Rosalea Collin. Patient confirms he will attend follow-up appointment tomorrow on 02/08/24.   Today, patient arrives in good spirits and presents without any assistance.  Current diabetes medications include: Jardiance  25 mg daily, glimepiride  8 mg daily before breakfast  Patient reports adherence to taking all medications as prescribed.   Insurance coverage: [employer] Medicare  Patient denies hypoglycemic events.  Patient denies nocturia (nighttime urination).  Patient reports neuropathy (nerve pain). Patient denies visual changes. Patient reports self foot exams.   Patient reported dietary habits: not covered at this visit  Patient-reported exercise habits: recently enrolled in the STEP program - engaging in more physical activity recently.    O:   CGM Study Consent: Yes Study visit: 3 Month Follow Up Visit  CGM Data Download date: 02/08/2024 - 90-day report % Time CGM Is Active: 96 % Average glucose (mg/dL): 161 mg/dL Glucose Management Indicator (%): 8.7 % Glucose Variability (%): 25.8 % Time Above Range >180 mg/dL (%): 76 % Time in Range 70-180 mg/dL (%): 24 % Time Below Range <70 mg/dL (%): 0 %  Diabetes Distress Scale Feeling like diabetes is taking up too much of my mental and physical energy every day.: A moderate problem Feeling that my doctor doesn't know enough about diabetes and diabetic care. : Not a problem Feeling angry, scared, and/or depressed when I think  about living with diabetes : A slight problem Feeling that my doctor doesn't give my clear directions on how to manage my diabetes. : Not a problem Feeling that im not testing my blood sugars frequently enough.: A serious problem Feeling that I'm often failing with my diabetes routine: A serious problem Feelling that friends and family are not supportive enough of self care efforts.: Not a problem Feeling that diabetes controls my life.: A slight problem Feeling that my doctor doesn't take my concerns seriously enough: Not a problem Not feeling confident in my day to day ability to manage diabetes: A slight problem Feeling that I will end up with serious long term complications no matter what I do.: A moderate problem Feeling that I am not sticking closely enough to a good meal plan.: A slight problem Feeling that friends or family don't appreciate how difficult living with diabetes can be. : A slight problem Feeling overwhelmed by the demands of living with diabetes.: A slight problem Feeling that I don't have a doctor who I can see.: Not a problem Not feeling motivated to keep up my diabetes self management.: A serious problem Feeling that friends or family don't give me the emotional support that I would like. : Not a problem DDS17 Score: 39 Emotional Burden Score: 2.4 Physician related distress score: 1 Regimen Related Distress score : 3.8 Interpersonal distress score: 1.33    Lab Results  Component Value Date   HGBA1C 8.6 (A) 02/08/2024    There were no vitals filed for this visit.   Lipid Panel     Component Value Date/Time   CHOL 130 02/04/2023  0841   TRIG 110 02/04/2023 0841   HDL 48 02/04/2023 0841   CHOLHDL 3.9 12/06/2020 1456   CHOLHDL 4.0 11/12/2016 1613   VLDL 28 11/12/2016 1613   LDLCALC 62 02/04/2023 0841    Clinical Atherosclerotic Cardiovascular Disease (ASCVD): No  The 10-year ASCVD risk score (Arnett DK, et al., 2019) is: 2.9%   Values used to calculate  the score:     Age: 43 years     Sex: Male     Is Non-Hispanic African American: No     Diabetic: Yes     Tobacco smoker: Yes     Systolic Blood Pressure: 102 mmHg     Is BP treated: No     HDL Cholesterol: 48 mg/dL     Total Cholesterol: 130 mg/dL   Patient is participating in a Managed Medicaid Plan: No    A/P:  LIBERATE Study:  - 6 sensors provided for a 3 month supply. Educated to contact the office if the sensor falls off early and replacements are needed before their next [employer].   Diabetes longstanding currently uncontrolled based on A1c 8.6% above goal < 7% - consistent with A1C from initial visit. He does have longstanding neuropathy but it otherwise asymptomatic today. Patient is able to verbalize appropriate hypoglycemia management plan. Medication adherence appears toe be appropriate. Discussed utilizing sensors over the next three months to gather information on BG trends relating to diet and physical activity.  -Continued current regimen per Endocrine. Patient has follow-up scheduled tomorrow. He has declined starting injectable medications in the past.  -Extensively discussed pathophysiology of diabetes, recommended lifestyle interventions, dietary effects on blood sugar control.  -Counseled on s/sx of and management of hypoglycemia.  -Next A1c anticipated 04/2024 - which will be at his 6 mo LIBERATE study visit  Written patient instructions provided. Patient verbalized understanding of treatment plan.  Total time in face to face counseling 30 minutes.    Follow-up:  Pharmacist in 1 month for INR. PCP 08/08/24  Arthea Larsson, PharmD PGY1 Pharmacy Resident  Marene Shape, PharmD, BCACP, CPP Clinical Pharmacist [facility] [phone]

## 2024-02-08 NOTE — Progress Notes (Signed)
    S:     Chief Complaint  Patient presents with   Anticoagulation   Diabetes   43 y.o. Bradley [name] who presents for diabetes evaluation, education, and management in the context of the LIBERATE Study.  Today, patient arrives in good spirits and presents without any assistance.  Patient was referred and last seen by Primary Care Provider, Dr. Adan Holms, on 02/02/2024. Is managed by Endo for DM - sees Dr. Rosalea Collin 02/09/2024.   PNH is significant for T2DM complicated by neuropathy, HTN, chronic VTE (anticoagulated on Warfarin), bipolar I, morbid obesity, tobacco use.    Patient reports Diabetes is longstanding.  Current diabetes medications include: glimepiride  8 mg daily, Jardiance  25 mg daily   Patient reports adherence to taking all medications as prescribed.   Insurance coverage: [employer] Medicare   Patient denies hypoglycemic events.  Patient denies nocturia (nighttime urination).  Patient denies neuropathy (nerve pain). Patient denies visual changes. Patient reports self foot exams.   Patient reported dietary habits: not discussed   Patient-reported exercise habits: not discussed    O:   Lab Results  Component Value Date   HGBA1C 8.6 (A) 02/08/2024     There were no vitals filed for this visit.   Lipid Panel     Component Value Date/Time   CHOL 130 02/04/2023 0841   TRIG 110 02/04/2023 0841   HDL 48 02/04/2023 0841   CHOLHDL 3.9 12/06/2020 1456   CHOLHDL 4.0 11/12/2016 1613   VLDL 28 11/12/2016 1613   LDLCALC Bradley 02/04/2023 0841    Clinical Atherosclerotic Cardiovascular Disease (ASCVD): No  The 10-year ASCVD risk score (Arnett DK, et al., 2019) is: 2.9%   Values used to calculate the score:     Age: 49 years     Sex: Bradley [name]     Is Non-Hispanic African American: No     Diabetic: Yes     Tobacco smoker: Yes     Systolic Blood Pressure: 102 mmHg     Is BP treated: No     HDL Cholesterol: 48 mg/dL     Total Cholesterol: 130 mg/dL   Patient is participating in  a Managed Medicaid Plan: No     A/P:  LIBERATE Study:  - 6 sensors provided for a 3 month supply. Educated to contact the office if the sensor falls off early and replacements are needed before their next [employer].   Diabetes longstanding currently uncontrolled. Patient is able to verbalize appropriate hypoglycemia management plan. Medication adherence appears to be okay. Control is suboptimal due to dietary indiscretion. -Continued current regimen. -Extensively discussed pathophysiology of diabetes, recommended lifestyle interventions, dietary effects on blood sugar control.  -Counseled on s/sx of and management of hypoglycemia.  -Next A1c anticipated 04/2024.   Written patient instructions provided. Patient verbalized understanding of treatment plan.  Total time in face to face counseling 20 minutes.    Follow-up:  Pharmacist 1 month.  Marene Shape, PharmD, Becky Bowels, CPP Clinical Pharmacist [facility] [phone]

## 2024-02-09 ENCOUNTER — Ambulatory Visit: Attending: Family Medicine

## 2024-02-09 ENCOUNTER — Ambulatory Visit: Payer: Medicare HMO | Admitting: Internal Medicine

## 2024-02-09 ENCOUNTER — Other Ambulatory Visit: Payer: Self-pay

## 2024-02-09 ENCOUNTER — Encounter: Payer: Self-pay | Admitting: Internal Medicine

## 2024-02-09 VITALS — BP 110/70 | HR 102 | Ht 72.0 in | Wt 308.0 lb

## 2024-02-09 DIAGNOSIS — E781 Pure hyperglyceridemia: Secondary | ICD-10-CM

## 2024-02-09 DIAGNOSIS — Z794 Long term (current) use of insulin: Secondary | ICD-10-CM

## 2024-02-09 DIAGNOSIS — E1149 Type 2 diabetes mellitus with other diabetic neurological complication: Secondary | ICD-10-CM | POA: Diagnosis not present

## 2024-02-09 DIAGNOSIS — E1165 Type 2 diabetes mellitus with hyperglycemia: Secondary | ICD-10-CM

## 2024-02-09 DIAGNOSIS — E113293 Type 2 diabetes mellitus with mild nonproliferative diabetic retinopathy without macular edema, bilateral: Secondary | ICD-10-CM | POA: Diagnosis not present

## 2024-02-09 DIAGNOSIS — E1142 Type 2 diabetes mellitus with diabetic polyneuropathy: Secondary | ICD-10-CM

## 2024-02-09 MED ORDER — FREESTYLE LIBRE 3 SENSOR MISC
1.0000 | 3 refills | Status: AC
Start: 2024-02-09 — End: ?
  Filled 2024-02-09 (×3): qty 2, 28d supply, fill #0
  Filled 2024-03-22 – 2024-05-06 (×2): qty 2, 28d supply, fill #1

## 2024-02-09 MED ORDER — GLIPIZIDE 10 MG PO TABS
10.0000 mg | ORAL_TABLET | Freq: Two times a day (BID) | ORAL | 3 refills | Status: DC
  Filled 2024-02-09 (×2): qty 180, 90d supply, fill #0
  Filled 2024-05-06: qty 180, 90d supply, fill #1

## 2024-02-09 NOTE — Patient Instructions (Addendum)
-   Stop   Glimepiride  4 mg , 1 tablet before the first meal of the day  - Start Glipizide  10 mg, 1 tablet before Breakfast and 1 tablet before Supper - Continue Jardiance  25 mg, 1 tablet daily      HOW TO TREAT LOW BLOOD SUGARS (Blood sugar LESS THAN 70 MG/DL) Please follow the RULE OF 15 for the treatment of hypoglycemia treatment (when your (blood sugars are less than 70 mg/dL)   STEP 1: Take 15 grams of carbohydrates when your blood sugar is low, which includes:  3-4 GLUCOSE TABS  OR 3-4 OZ OF JUICE OR REGULAR SODA OR ONE TUBE OF GLUCOSE GEL    STEP 2: RECHECK blood sugar in 15 MINUTES STEP 3: If your blood sugar is still low at the 15 minute recheck --> then, go back to STEP 1 and treat AGAIN with another 15 grams of carbohydrates.

## 2024-02-09 NOTE — Progress Notes (Signed)
 Name: Bradley [name]  Age/ Sex: 43 y.o., male   MRN/ DOB: [id number], 07-06-1981     PCP: Bradley Mulberry, MD   Reason for Endocrinology Evaluation: Type 2 Diabetes Mellitus  Initial Endocrine Consultative Visit: 10/18/2020    PATIENT IDENTIFIER: Bradley [name] is a 43 y.o. male with a past medical history of T2Dm, DVT and bipolar disorder. The patient has followed with Endocrinology clinic since 10/18/2020 for consultative assistance with management of his diabetes.  DIABETIC HISTORY:  Bradley [name] was diagnosed with DM yrs ago. Basaglar  made him feel sluggish. His hemoglobin A1c has ranged from 7.7% in 2019, peaking at 14.9% in 2021.   On his initial visit to our clinic his A1c was 13.6 %, he DECLINED insulin . He was on Glipizide  and Trulicity , we increased Glipizide , Trulicity  and added Pioglitazone . By the time he went  To see our RD in 12/2020 he was off pioglitazone   Due to abdominal cramps    Stopped Glipizide  and started basal insulin  12/2020  We stop Trulicity  by March 2023 as he was not taking it and he would forget to take it, we will started him on Jardiance  instead as his daily tablet to improve compliance  He self discontinued basal insulin  2024, and declined prandial insulin  as well as CeQur  Attempted to prescribe Rybelsus  but developed side effects 04/2023 and started glimepiride    He is disabled due to mental health   SUBJECTIVE:   During the last visit (10/09/2023): A1c 8.6%   Today (02/09/2024): Bradley [name] is here for a follow up on diabetes management.  He is accompanied by Bradley [name].  He does not check his glucose and has no interest in doing so.  The patient has been following up with the [facility] clinic for education and exercise sessions, he is also participating in a study through  through [employer]   He had slight nausea yesterday but this has resolved while having his INR checked  Denies constipation or diarrhea  Denies  UTI    HOME DIABETES REGIMEN:  Jardiance  25 mg daily Glimepiride  4 mg, 2 tabs daily    Statin: no ACE-I/ARB: no Prior Diabetic Education: yes   CONTINUOUS GLUCOSE MONITORING RECORD INTERPRETATION    Dates of Recording: 4/30-5/13/2025  Sensor description: freestyle libre  Results statistics:   CGM use % of time 73  Average and SD 217/24.5  Time in range    27    %  % Time Above 180 45  % Time above 250 28  % Time Below target 0   Glycemic patterns summary: BGs are elevated throughout the day and night  Hyperglycemic episodes all day and night  Hypoglycemic episodes occurred N/A  Overnight periods: Mostly high     DIABETIC COMPLICATIONS: Microvascular complications:  Neuropathy, B/L DR  Denies: CKD retinopathy Last Eye Exam: Completed 04/2023  Macrovascular complications:   Denies: CAD, CVA, PVD   HISTORY:  Past Medical History:  Past Medical History:  Diagnosis Date   ADHD    ADHD (attention deficit hyperactivity disorder)    Bipolar 1 disorder (HCC)    Bipolar disorder (HCC)    Depression    Diabetes mellitus without complication (HCC)    Hyperlipidemia    Morbid obesity (HCC)    Obesity    Panic attack    Varicose veins of both lower extremities with pain    Past Surgical History:  Past Surgical History:  Procedure Laterality Date   surgery on meatus as  a child     Social History:  reports that he has been smoking cigarettes. He has a 15 pack-year smoking history. He has never used smokeless tobacco. He reports current alcohol  use of about 1.0 standard drink of alcohol  per week. He reports that he does not use drugs. Family History:  Family History  Problem Relation Age of Onset   Rheum arthritis Mother    Diabetes Mother    Heart failure Mother    Stroke Mother    ADD / ADHD Mother    Anxiety disorder Mother    Arthritis Mother    Depression Mother    Drug abuse Mother    Alcohol  abuse Father    Glaucoma Maternal Grandmother     COPD Maternal Grandmother    Lung cancer Paternal Grandmother        smoker     HOME MEDICATIONS: Allergies as of 02/09/2024       Reactions   Actos  [pioglitazone ] Other (See Comments)   Stomach cramps   Gabapentin  Other (See Comments)   Crying spells   Lyrica  [pregabalin ] Other (See Comments)   Makes the patient somnolent        Medication List        Accurate as of Feb 09, 2024  2:30 PM. If you have any questions, ask your nurse or doctor.          ACCU-CHEK ACTIVE GLUCOSE CONT VI by In Vitro route.   Accu-Chek Guide test strip Generic drug: glucose blood 1 each by Other route 3 (three) times daily. Use as instructed   ARIPiprazole  5 MG tablet Commonly known as: ABILIFY  Take 1 tablet (5 mg total) by mouth at bedtime.   atorvastatin  20 MG tablet Commonly known as: LIPITOR Take 1 tablet (20 mg total) by mouth daily.   doxepin  50 MG capsule Commonly known as: SINEQUAN  Take 1 capsule (50 mg total) by mouth as needed for sleep   doxepin  25 MG capsule Commonly known as: SINEQUAN  Take 25 mg by mouth at bedtime as needed.   DULoxetine  30 MG capsule Commonly known as: CYMBALTA  Take 30 mg by mouth daily.   DULoxetine  60 MG capsule Commonly known as: CYMBALTA  Take 1 capsule(s) by mouth at bedtime for anxiety/depression   gentamicin  cream 0.1 % Commonly known as: GARAMYCIN  Apply to wound left lower extremity once daily until healed.   glimepiride  4 MG tablet Commonly known as: AMARYL  Take 2 tablets (8 mg total) by mouth daily before breakfast.   Jardiance  25 MG Tabs tablet Generic drug: empagliflozin  Take 1 tablet (25 mg total) by mouth daily before breakfast.   mupirocin  ointment 2 % Commonly known as: BACTROBAN  Apply to right great toe once daily.   OneTouch Delica Lancets 33G Misc 1 Device by Does not apply route 3 (three) times daily.   sildenafil  50 MG tablet Commonly known as: Viagra  Take 1 tablet (50 mg total) by mouth daily as needed for  erectile dysfunction. At least 24 hours between doses   traMADol  50 MG tablet Commonly known as: ULTRAM  Take 1 tablet (50 mg total) by mouth every 12 (twelve) hours as needed.   warfarin 2.5 MG tablet Commonly known as: COUMADIN  Take as directed by the anticoagulation clinic. If you are unsure how to take this medication, talk to your nurse or doctor. Original instructions: TAKE 1 TABLET BY MOUTH DAILY AS DIRECTED BY THE COUMADIN  CLINIC.         OBJECTIVE:   Vital Signs: BP 110/70 (  BP Location: Left Arm, Patient Position: Sitting, Cuff Size: Normal)   Pulse (!) 102   Ht 6' (1.829 m)   Wt (!) 308 lb (139.7 kg)   SpO2 97%   BMI 41.77 kg/m   Wt Readings from Last 3 Encounters:  02/09/24 (!) 308 lb (139.7 kg)  02/02/24 (!) 312 lb (141.5 kg)  02/01/24 (!) 315 lb (142.9 kg)     Exam: General: Pt appears well and is in NAD  Lungs: CTA bilaterally  Heart: RRR   Extremities: Trace pretibial edema.  Stasis dermatitis over the shins  Neuro: MS is good with appropriate affect, pt is alert and Ox3     DM foot exam: 09/15/2023 per podiatry      DATA REVIEWED:  Lab Results  Component Value Date   HGBA1C 8.6 (A) 02/08/2024   HGBA1C 8.6 (A) 11/09/2023   HGBA1C 8.5 (A) 10/09/2023    Latest Reference Range & Units 08/05/23 14:57  Sodium 134 - 144 mmol/L 139  Potassium 3.5 - 5.2 mmol/L 4.5  Chloride 96 - 106 mmol/L 99  CO2 20 - 29 mmol/L 24  Glucose 70 - 99 mg/dL 960 (H)  BUN 6 - 24 mg/dL 13  Creatinine 4.54 - 0.98 mg/dL 1.19  Calcium  8.7 - 10.2 mg/dL 9.3  BUN/Creatinine Ratio 9 - 20  14  eGFR >59 mL/min/1.73 101  Alkaline Phosphatase 44 - 121 IU/L 80  Albumin 4.1 - 5.1 g/dL 4.4  AST 0 - 40 IU/L 24  ALT 0 - 44 IU/L 33  Total Protein 6.0 - 8.5 g/dL 7.2  Total Bilirubin 0.0 - 1.2 mg/dL 0.9  (H): Data is abnormally high     ASSESSMENT / PLAN / RECOMMENDATIONS:   1) Type 2 Diabetes Mellitus, Poorly controlled, With Neuropathic and retinopathic complications - Most  recent A1c of 8.6%. Goal A1c < 7.0 %.   -A1c remains above goal -Intolerant to Trulicity  and Rybelsus  -Declines insulin  -Despite increasing glimepiride  he continues with hyperglycemia, I have recommended restarting glipizide , emphasized the importance of taking this 15-20 minutes before breakfast and supper  MEDICATIONS:  Stop glimepiride  4 mg daily Start glipizide  10 mg, 1 tablet before breakfast and 1 tablet before supper Continue  Jardiance  25 mg daily   EDUCATION / INSTRUCTIONS: BG monitoring instructions: Patient is instructed to check his blood sugars 3 times a day, before meals  Call  Endocrinology clinic if: BG persistently < 70  I reviewed the Rule of 15 for the treatment of hypoglycemia in detail with the patient. Literature supplied.    2) Diabetic complications:  Eye: Does not have known diabetic retinopathy.  Neuro/ Feet: Does  have known diabetic peripheral neuropathy  Renal: Patient does not have known baseline CKD. He   is not on an ACEI/ARB at present.   3) Hypertriglyceridemia:  -Historically he has had elevated triglycerides up to 367 mg/DL in 1478, over the years this has trended down to normal levels -Patient on atorvastatin  20 mg daily   F/U in 4 months  I spent 25 minutes preparing to see the patient by review of recent labs, imaging and procedures, obtaining and reviewing separately obtained history, communicating with the patient/family or caregiver, ordering medications, tests or procedures, and documenting clinical information in the EHR including the differential Dx, treatment, and any further evaluation and other management   Signed electronically by: Natale Bail, MD  [facility] Endocrinology  [facility] [address] [place] [place], [place] [address] Phone: [phone] FAX:  [phone]   CC: Bradley Mulberry, MD [address] [place] [address] Phone: [phone]  Fax:  [phone]  Return to Endocrinology clinic as below: Future Appointments  Date Time Provider Department Center  02/09/2024  2:40 PM Rielle Schlauch, Julian Obey, MD [facility] None  02/09/2024  3:30 PM [facility] LAB [facility] None  02/11/2024  2:00 PM Tharon Finder, RD [facility] NDM  02/16/2024  2:10 PM [facility] ANNUAL WELLNESS VISIT [facility] None  02/17/2024  3:00 PM Augustin Leber, RN [facility] None  03/15/2024  4:00 PM Valente Gaskin, RPH-CPP [facility] None  03/30/2024  3:00 PM Luella Sager, DPM [facility]  05/16/2024  1:00 PM Ronelle Coffee, MD [facility] None  08/08/2024  2:30 PM Bradley Mulberry, MD [facility] None

## 2024-02-10 ENCOUNTER — Ambulatory Visit: Payer: Self-pay | Admitting: Family Medicine

## 2024-02-10 LAB — CMP14+EGFR
ALT: 30 IU/L (ref 0–44)
AST: 24 IU/L (ref 0–40)
Albumin: 4.6 g/dL (ref 4.1–5.1)
Alkaline Phosphatase: 85 IU/L (ref 44–121)
BUN/Creatinine Ratio: 14 (ref 9–20)
BUN: 15 mg/dL (ref 6–24)
Bilirubin Total: 0.8 mg/dL (ref 0.0–1.2)
CO2: 21 mmol/L (ref 20–29)
Calcium: 9.3 mg/dL (ref 8.7–10.2)
Chloride: 100 mmol/L (ref 96–106)
Creatinine, Ser: 1.08 mg/dL (ref 0.76–1.27)
Globulin, Total: 2.5 g/dL (ref 1.5–4.5)
Glucose: 184 mg/dL — ABNORMAL HIGH (ref 70–99)
Potassium: 5 mmol/L (ref 3.5–5.2)
Sodium: 140 mmol/L (ref 134–144)
Total Protein: 7.1 g/dL (ref 6.0–8.5)
eGFR: 87 mL/min/{1.73_m2} (ref >59)

## 2024-02-10 LAB — LP+NON-HDL CHOLESTEROL
Cholesterol, Total: 126 mg/dL (ref 100–199)
HDL: 46 mg/dL (ref >39)
LDL Chol Calc (NIH): 57 mg/dL (ref 0–99)
Total Non-HDL-Chol (LDL+VLDL): 80 mg/dL (ref 0–129)
Triglycerides: 129 mg/dL (ref 0–149)
VLDL Cholesterol Cal: 23 mg/dL (ref 5–40)

## 2024-02-11 ENCOUNTER — Encounter: Payer: Self-pay | Admitting: Dietician

## 2024-02-11 ENCOUNTER — Encounter: Payer: Medicare HMO | Attending: Internal Medicine | Admitting: Dietician

## 2024-02-11 DIAGNOSIS — Z794 Long term (current) use of insulin: Secondary | ICD-10-CM | POA: Diagnosis not present

## 2024-02-11 DIAGNOSIS — E1165 Type 2 diabetes mellitus with hyperglycemia: Secondary | ICD-10-CM | POA: Diagnosis not present

## 2024-02-11 NOTE — Progress Notes (Signed)
 Diabetes Self-Management Education  Visit Type: Follow-up  Appt. Start Time: 1415 Appt. End Time: 1445  02/11/2024  [name], identified by name and date of birth, is a 43 y.o. male with a diagnosis of Diabetes:    ASSESSMENT Patient is here today with his girlfriend.  He was last seen by myself 10/20/2023.  He is now part of the research study at [facility] and they are providing patient with the Bradley [name]. He completed the PREP class [employer] program which meets Monday and Wednesday for [name] months. Since finishing he is walking the dog most days and going to gym on Friday's. Decreased smoking to 1/2 pack but has increased vaping.  He cannot find a lower nicotine  vape and does not like the zero nicotine  vape. States he has gained weight due to medications but also food choices. Likes little vegetables and does not wish to try more options.    History includes Type 2 Diabetes (2019), DVT, Bipolar, ADHD, neuropathy, smoking, memory issues, polycythemia, thrombocytopenia, history of DVT/PE, nonproliferative retinopathy Labs noted to include:  A1C 8.6% on 02/08/2024, 8.5% 10/08/2022, 7.9% 08/05/2023, 9.4% 03/24/2023  cholesterol 193, Triglycerides 365, HDL 40, LDL 92 08/09/2020 Medications include:Trulicity (stopped), glipizide , actos  (discontinued), Jardiance , Tresiba  70 units q HS (not taking), Novolog  before meals (not taking), coumadin , Abilify  and other medications that contribute to weight gain. He stopped pioglitizone after [name] days due to stomach cramps. He could not tolerated Metformin  due to bowel incontinence.  CGM:  Libre [name]  CGM Results from download: 02/11/2024  % Time CGM active:   74 %   (Goal >70%)  Average glucose:   218 mg/dL for 14 days  Glucose management indicator:   8.5 %  Time in range (70-180 mg/dL):   16%   (Goal >10%)  Time High (181-250 mg/dL):   52 %   (Goal < 96%)  Time Very High (>250 mg/dL):    04%   (Goal < 5%)  Time Low  (54-69 mg/dL):   0 %   (Goal <5%)  Time Very Low (<54 mg/dL):   0 %   (Goal <4%)  %CV (glucose variability)     %  (Goal <36%)    Sleep:  Eats late (10pm - midnight).  Goes to sleep late.  Wakes about 10:30 am (although later now) Support:  [facility], patient of Dr. Rosalea [name] at [facility], PCP Bradley Mulberry, MD   Weight: 311 lbs 02/11/2024  Patient states he gained weight due to the Glimepiride  (now changed to Glipizide ) 300 lbs 10/20/2023 269 lbs 05/29/2023 263 lbs 03/06/2023 262 lbs [name]/07/2023 260 lbs 09/08/2022 261 lbs 07/21/2022 263 lbs 04/21/2022 278 lbs [name]/23/2023 279 lbs 10/24/2021 282 lbs 08/16/2021 loss likely due to uncontrolled blood glucose 295 lbs 02/14/21 306 lbs 47/2022 306 lbs 11/23/2020 425 lbs highest adult weight 2 years ago (loss presumably due to uncontrolled blood sugar) Lowest adult weight 235 lbs about 8 years ago and he does not recall circumstances of increased weight gain.   Patient lives with his girlfriend.  She has type 2 diabetes.  He does the shopping and cooking.  Dislikes most vegetables. Drinking mostly sugar free options. Likes, green beans, peas, corn and no other vegetables He is on disability due to mental health.  Finances are a concern. His mother passed away 12-05-2020 from complications of diabetes. Girlfriend  goes to the gym 2-[name] times per week [facility]).  Patient is not motivated to  go. Complains of neuropathy pain which worsens with exercise.    Diabetes Self-Management Education - 02/11/24 1454       Visit Information   Visit Type Follow-up      Psychosocial Assessment   What is the hardest part about your diabetes right now, causing you the most concern, or is the most worrisome to you about your diabetes?   Making healty food and beverage choices    Self-care barriers Lack of material resources    Self-management support Doctor's office;Friends;CDE visits    Other persons present  Patient;Spouse/SO    Patient Concerns Nutrition/Meal planning;Glycemic Control;Weight Control;Problem Solving    Special Needs Simplified materials    Preferred Learning Style No preference indicated    Learning Readiness Ready    How often do you need to have someone help you when you read instructions, pamphlets, or other written materials from your doctor or pharmacy? [name] - Sometimes      Pre-Education Assessment   Patient understands the diabetes disease and treatment process. Comprehends key points    Patient understands incorporating nutritional management into lifestyle. Needs Review    Patient undertands incorporating physical activity into lifestyle. Comprehends key points    Patient understands using medications safely. Comprehends key points    Patient understands monitoring blood glucose, interpreting and using results Needs Review    Patient understands prevention, detection, and treatment of acute complications. Needs Review    Patient understands prevention, detection, and treatment of chronic complications. Needs Review    Patient understands how to develop strategies to address psychosocial issues. Needs Review    Patient understands how to develop strategies to promote health/change behavior. Needs Review      Complications   Last HgB A1C per patient/outside source 8.6 %   02/07/2024 and 8.5% 10/20/2023   How often do you check your blood sugar? > 4 times/day   Libre   Fasting Blood glucose range (mg/dL) 213-086;578-469    Postprandial Blood glucose range (mg/dL) >629    Number of hypoglycemic episodes per month 0    Number of hyperglycemic episodes ( >200mg /dL): Daily      Dietary Intake   Breakfast Reece puff (1 1/2 cups) cereal, 2% milk    Lunch 4 hotdogs without a bun    Dinner 2 double cheeseburgers, 2 [employer] apple pies    Beverage(s) Propel water, diet soda, coffee with equal      Activity / Exercise   Activity / Exercise Type Light (walking / raking leaves)     How many days per week do you exercise? 5    How many minutes per day do you exercise? 30    Total minutes per week of exercise 150      Patient Education   Previous Diabetes Education Yes (please comment)   09/2023   Healthy Eating Meal options for control of blood glucose level and chronic complications.;Information on hints to eating out and maintain blood glucose control.    Being Active Role of exercise on diabetes management, blood pressure control and cardiac health.    Medications Reviewed patients medication for diabetes, action, purpose, timing of dose and side effects.    Monitoring Yearly dilated eye exam;Daily foot exams;Taught/evaluated CGM (comment);Identified appropriate SMBG and/or A1C goals.    Chronic complications Relationship between chronic complications and blood glucose control    Diabetes Stress and Support Identified and addressed patients feelings and concerns about diabetes;Worked with patient to identify barriers to care and solutions  Individualized Goals (developed by patient)   Nutrition General guidelines for healthy choices and portions discussed    Physical Activity Exercise 5-7 days per week;30 minutes per day    Medications take my medication as prescribed    Monitoring  Consistenly use CGM    Problem Solving Eating Pattern;Addressing barriers to behavior change    Reducing Risk stop smoking;examine blood glucose patterns;do foot checks daily;treat hypoglycemia with 15 grams of carbs if blood glucose less than 70mg /dL      Patient Self-Evaluation of Goals - Patient rates self as meeting previously set goals (% of time)   Nutrition 25 - 50% (sometimes)    Physical Activity 50 - 75 % (half of the time)    Medications >75% (most of the time)    Monitoring >75% (most of the time)    Problem Solving and behavior change strategies  25 - 50% (sometimes)    Reducing Risk (treating acute and chronic complications) < 25% (hardly ever/never)    Health  Coping < 25% (hardly ever/never)      Post-Education Assessment   Patient understands the diabetes disease and treatment process. Comprehends key points    Patient understands incorporating nutritional management into lifestyle. Needs Review    Patient undertands incorporating physical activity into lifestyle. Comprehends key points    Patient understands using medications safely. Needs Review    Patient understands monitoring blood glucose, interpreting and using results Needs Review    Patient understands prevention, detection, and treatment of acute complications. Comprehends key points    Patient understands prevention, detection, and treatment of chronic complications. Comprehends key points    Patient understands how to develop strategies to address psychosocial issues. Comprehends key points    Patient understands how to develop strategies to promote health/change behavior. Needs Review      Outcomes   Future DMSE [name]-4 months    Program Status Not Completed      Subsequent Visit   Since your last visit have you continued or begun to take your medications as prescribed? Yes    Since your last visit have you experienced any weight changes? Gain    Weight Gain (lbs) 11    Since your last visit, are you checking your blood glucose at least once a day? Yes             Individualized Plan for Diabetes Self-Management Training:   Learning Objective:  Patient will have a greater understanding of diabetes self-management. Patient education plan is to attend individual and/or group sessions per assessed needs and concerns.   Plan:   Patient Instructions   Patient Instructions    Be consistent with exercise - walking and gym. Consider reducing smoking/vaping   Aim to get your blood glucose more in the green range by exercising and watching what you eat.      Blood glucose goals:             80-130 fasting              100-180 two hours after starting any meal              Generally a rise of 40-60 points after a meal is normal   A1C Goal:   Less than 7%.  Your last A1C was       Lab Results  Component Value Date    HGBA1C 8.5 (A) 10/09/2023    Expected Outcomes:     Education material provided:   If problems  or questions, patient to contact team via:  Phone  Future DSME appointment: [name]-4 months

## 2024-02-11 NOTE — Patient Instructions (Signed)
 Patient Instructions    Be consistent with exercise - walking and gym. Consider reducing smoking/vaping   Aim to get your blood glucose more in the green range by exercising and watching what you eat.      Blood glucose goals:             80-130 fasting              100-180 two hours after starting any meal             Generally a rise of 40-60 points after a meal is normal   A1C Goal:   Less than 7%.  Your last A1C was       Lab Results  Component Value Date    HGBA1C 8.5 (A) 10/09/2023

## 2024-02-15 ENCOUNTER — Encounter: Payer: Self-pay | Admitting: Internal Medicine

## 2024-02-16 ENCOUNTER — Ambulatory Visit: Payer: Medicare HMO | Attending: Family Medicine

## 2024-02-16 VITALS — Ht 72.0 in | Wt 311.8 lb

## 2024-02-16 DIAGNOSIS — Z0000 Encounter for general adult medical examination without abnormal findings: Secondary | ICD-10-CM

## 2024-02-16 NOTE — Patient Instructions (Signed)
 [name] , Thank you for taking time out of your busy schedule to complete your Annual Wellness Visit with me. I enjoyed our conversation and look forward to speaking with you again next year. I, as well as your care team,  appreciate your ongoing commitment to your health goals. Please review the following plan we discussed and let me know if I can assist you in the future. Your Game plan/ To Do List    Referrals: If you haven't heard from the office you've been referred to, please reach out to them at the phone provided.   Follow up Visits: Next Medicare AWV with our clinical staff: 02/21/2025 at 2:10 pm PHONE VISIT with Nurse   Have you seen your provider in the last 6 months (3 months if uncontrolled diabetes)? Yes Next Office Visit with your provider: 08/08/2024 at 2:30 pm OFFICE VISIT with Dr. Lincoln Renshaw.  Clinician Recommendations:  Aim for 30 minutes of exercise or brisk walking, 6-8 glasses of water, and 5 servings of fruits and vegetables each day.       This is a list of the screening recommended for you and due dates:  Health Maintenance  Topic Date Due   COVID-19 Vaccine (1 - 2024-25 season) Never done   Yearly kidney health urinalysis for diabetes  02/03/2024   DTaP/Tdap/Td vaccine (1 - Tdap) 08/04/2024*   Pneumococcal Vaccination (2 of 2 - PCV) 02/01/2025*   Flu Shot  04/29/2024   Eye exam for diabetics  05/21/2024   Hemoglobin A1C  08/10/2024   Complete foot exam   12/14/2024   Yearly kidney function blood test for diabetes  02/08/2025   Medicare Annual Wellness Visit  02/15/2025   Hepatitis C Screening  Completed   HIV Screening  Completed   HPV Vaccine  Aged Out   Meningitis B Vaccine  Aged Out  *Topic was postponed. The date shown is not the original due date.    Advanced directives: (Declined) Advance directive discussed with you today. Even though you declined this today, please call our office should you change your mind, and we can give you the proper  paperwork for you to fill out. Advance Care Planning is important because it:  [x]  Makes sure you receive the medical care that is consistent with your values, goals, and preferences  [x]  It provides guidance to your family and loved ones and reduces their decisional burden about whether or not they are making the right decisions based on your wishes.  Follow the link provided in your after visit summary or read over the paperwork we have mailed to you to help you started getting your Advance Directives in place. If you need assistance in completing these, please reach out to us  so that we can help you!  See attachments for Preventive Care and Fall Prevention Tips.

## 2024-02-16 NOTE — Progress Notes (Addendum)
 Because this visit was a virtual/telehealth visit,  certain criteria was not obtained, such a blood pressure, CBG if applicable, and timed get up and go. Any medications not marked as "taking" were not mentioned during the medication reconciliation part of the visit. Any vitals not documented were not able to be obtained due to this being a telehealth visit or patient was unable to self-report a recent blood pressure reading due to a lack of equipment at home via telehealth. Vitals that have been documented are verbally provided by the patient.   Subjective:   Bradley [name] is a 43 y.o. who presents for a Medicare Wellness preventive visit.  As a reminder, Annual Wellness Visits don't include a physical exam, and some assessments may be limited, especially if this visit is performed virtually. We may recommend an in-person follow-up visit with your provider if needed.  Visit Complete: Virtual I connected with  Bradley [name] on 02/16/24 by a audio enabled telemedicine application and verified that I am speaking with the correct person using two identifiers.  Patient Location: Home  Provider Location: Office/Clinic  I discussed the limitations of evaluation and management by telemedicine. The patient expressed understanding and agreed to proceed.  Vital Signs: Because this visit was a virtual/telehealth visit, some criteria may be missing or patient reported. Any vitals not documented were not able to be obtained and vitals that have been documented are patient reported.  VideoDeclined- This patient declined Librarian, academic. Therefore the visit was completed with audio only.  Persons Participating in Visit: Patient.  AWV Questionnaire: No: Patient Medicare AWV questionnaire was not completed prior to this visit.  Cardiac Risk Factors include: diabetes mellitus;dyslipidemia;male gender;obesity (BMI >30kg/m2);sedentary lifestyle;smoking/ tobacco  exposure;family history of premature cardiovascular disease     Objective:     Today's Vitals   02/16/24 1419  Weight: (!) 311 lb 12.8 oz (141.4 kg)  Height: 6' (1.829 m)  PainSc: 3   PainLoc: Knee   Body mass index is 42.29 kg/m.     02/16/2024    2:22 PM 02/11/2023    9:45 PM 04/25/2022   10:44 PM 02/06/2022    2:43 PM 12/17/2020    1:18 PM 11/23/2020   10:57 AM 04/14/2019    4:05 PM  Advanced Directives  Does Patient Have a Medical Advance Directive? No No No No No No No  Would patient like information on creating a medical advance directive? No - Patient declined Yes (MAU/Ambulatory/Procedural Areas - Information given) No - Patient declined No - Patient declined No - Patient declined Yes (MAU/Ambulatory/Procedural Areas - Information given) Yes (Inpatient - patient defers creating a medical advance directive at this time - Information given);Yes (ED - Information included in AVS)    Current Medications (verified) Outpatient Encounter Medications as of 02/16/2024  Medication Sig   ARIPiprazole  (ABILIFY ) 5 MG tablet Take 1 tablet (5 mg total) by mouth at bedtime.   atorvastatin  (LIPITOR) 20 MG tablet Take 1 tablet (20 mg total) by mouth daily.   Blood Glucose Calibration (ACCU-CHEK ACTIVE GLUCOSE CONT VI) by In Vitro route.   Continuous Glucose Sensor (FREESTYLE LIBRE 3 SENSOR) MISC Place 1 sensor on the skin every 14 days. Use to check glucose continuously   doxepin  (SINEQUAN ) 25 MG capsule Take 25 mg by mouth at bedtime as needed.   doxepin  (SINEQUAN ) 50 MG capsule Take 1 capsule (50 mg total) by mouth as needed for sleep   DULoxetine  (CYMBALTA ) 30 MG capsule Take  30 mg by mouth daily.   DULoxetine  (CYMBALTA ) 60 MG capsule Take 1 capsule(s) by mouth at bedtime for anxiety/depression   empagliflozin  (JARDIANCE ) 25 MG TABS tablet Take 1 tablet (25 mg total) by mouth daily before breakfast.   gentamicin  cream (GARAMYCIN ) 0.1 % Apply to wound left lower extremity once daily until  healed.   glipiZIDE  (GLUCOTROL ) 10 MG tablet Take 1 tablet (10 mg total) by mouth 2 (two) times daily before a meal.   glucose blood (ACCU-CHEK GUIDE) test strip 1 each by Other route 3 (three) times daily. Use as instructed   mupirocin  ointment (BACTROBAN ) 2 % Apply to right great toe once daily.   OneTouch Delica Lancets 33G MISC 1 Device by Does not apply route 3 (three) times daily.   sildenafil  (VIAGRA ) 50 MG tablet Take 1 tablet (50 mg total) by mouth daily as needed for erectile dysfunction. At least 24 hours between doses   traMADol  (ULTRAM ) 50 MG tablet Take 1 tablet (50 mg total) by mouth every 12 (twelve) hours as needed.   warfarin (COUMADIN ) 2.5 MG tablet TAKE 1 TABLET BY MOUTH DAILY AS DIRECTED BY THE COUMADIN  CLINIC.   No facility-administered encounter medications on file as of 02/16/2024.    Allergies (verified) Actos  [pioglitazone ], Gabapentin , and Lyrica  [pregabalin ]   History: Past Medical History:  Diagnosis Date   ADHD    ADHD (attention deficit hyperactivity disorder)    Bipolar 1 disorder (HCC)    Bipolar disorder (HCC)    Depression    Diabetes mellitus without complication (HCC)    Hyperlipidemia    Morbid obesity (HCC)    Obesity    Panic attack    Varicose veins of both lower extremities with pain    Past Surgical History:  Procedure Laterality Date   surgery on meatus as a child     Family History  Problem Relation Age of Onset   Rheum arthritis Mother    Diabetes Mother    Heart failure Mother    Stroke Mother    ADD / ADHD Mother    Anxiety disorder Mother    Arthritis Mother    Depression Mother    Drug abuse Mother    Alcohol  abuse Father    Glaucoma Maternal Grandmother    COPD Maternal Grandmother    Lung cancer Paternal Grandmother        smoker   Social History   Socioeconomic History   Marital status: Single    Spouse name: Not on file   Number of children: Not on file   Years of education: Not on file   Highest education  level: Some college, no degree  Occupational History   Not on file  Tobacco Use   Smoking status: Every Day    Current packs/day: 1.00    Average packs/day: 1 pack/day for 15.0 years (15.0 ttl pk-yrs)    Types: Cigarettes   Smokeless tobacco: Never  Vaping Use   Vaping status: Never Used  Substance and Sexual Activity   Alcohol  use: Yes    Alcohol /week: 1.0 standard drink of alcohol     Types: 1 Shots of liquor per week    Comment: Only on my birthday and New Year's   Drug use: No   Sexual activity: Not Currently    Birth control/protection: Abstinence  Other Topics Concern   Not on file  Social History Narrative   ** Merged History Encounter **       Social Drivers of Health  Financial Resource Strain: Low Risk  (02/16/2024)   Overall Financial Resource Strain (CARDIA)    Difficulty of Paying Living Expenses: Not hard at all  Food Insecurity: No Food Insecurity (02/16/2024)   Hunger Vital Sign    Worried About Running Out of Food in the Last Year: Never true    Ran Out of Food in the Last Year: Never true  Transportation Needs: No Transportation Needs (02/16/2024)   PRAPARE - Administrator, Civil Service (Medical): No    Lack of Transportation (Non-Medical): No  Physical Activity: Inactive (10/06/2023)   Exercise Vital Sign    Days of Exercise per Week: 0 days    Minutes of Exercise per Session: 0 min  Stress: Stress Concern Present (02/16/2024)   [employer] of Occupational Health - Occupational Stress Questionnaire    Feeling of Stress : To some extent  Social Connections: Moderately Isolated (02/16/2024)   Social Connection and Isolation Panel [NHANES]    Frequency of Communication with Friends and Family: Three times a week    Frequency of Social Gatherings with Friends and Family: Once a week    Attends Religious Services: Never    Database administrator or Organizations: No    Attends Engineer, structural: Never    Marital Status: Living  with partner    Tobacco Counseling Ready to quit: Not Answered Counseling given: Not Answered    Clinical Intake:  Pre-visit preparation completed: Yes  Pain : 0-10 Pain Score: 3  Pain Type: Chronic pain     BMI - recorded: 42.49 Nutritional Status: BMI > 30  Obese Nutritional Risks: None Diabetes: Yes CBG done?: No Did pt. bring in CBG monitor from home?: No  Lab Results  Component Value Date   HGBA1C 8.6 (A) 02/08/2024   HGBA1C 8.6 (A) 11/09/2023   HGBA1C 8.5 (A) 10/09/2023     How often do you need to have someone help you when you read instructions, pamphlets, or other written materials from your doctor or pharmacy?: 1 - Never  Interpreter Needed?: No  Information entered by :: Druscilla Gerhard, LPN.   Activities of Daily Living     02/16/2024    2:25 PM  In your present state of health, do you have any difficulty performing the following activities:  Hearing? 0  Vision? 0  Difficulty concentrating or making decisions? 0  Comment BSE: GAMES ON PHONE 3-4 TIMES PER MONTH  Walking or climbing stairs? 1  Comment cane prn  Dressing or bathing? 0  Doing errands, shopping? 0  Preparing Food and eating ? N  Using the Toilet? N  In the past six months, have you accidently leaked urine? N  Do you have problems with loss of bowel control? N  Managing your Medications? N  Managing your Finances? N  Housekeeping or managing your Housekeeping? N    Patient Care Team: Joaquin Mulberry, MD as PCP - General (Family Medicine) Ronelle Coffee, MD as Consulting Physician (Ophthalmology) [facility], Ibtehal Jaralla, MD as Attending Physician (Endocrinology) Luella Sager, DPM as Consulting Physician (Podiatry) Little, Skeeter Dukes, RN as VBCI Care Management Little, Skeeter Dukes, RN  Indicate any recent Medical Services you may have received from other than [facility] providers in the past year (date may be approximate).     Assessment:    This is a routine wellness  examination for Daiton.  Hearing/Vision screen Hearing Screening - Comments:: Denies hearing difficulties.  Vision Screening - Comments:: No rx glasses -  up to date with routine eye exams with Ronelle Coffee, MD.    Goals Addressed               This Visit's Progress     Patient Stated (pt-stated)        02/16/2024: None at this time.       Depression Screen     02/16/2024    2:42 PM 02/02/2024    2:49 PM 08/05/2023    2:26 PM 02/11/2023    9:43 PM 02/02/2023    2:12 PM 08/04/2022    2:09 PM 04/21/2022    1:42 PM  PHQ 2/9 Scores  PHQ - 2 Score 2 2 2 2 2 2 2   PHQ- 9 Score 9 9 9 7 7 6 7     Fall Risk     02/16/2024    2:23 PM 02/02/2024    2:48 PM 08/05/2023    2:26 PM 03/09/2023    2:23 PM 02/11/2023    9:38 PM  Fall Risk   Falls in the past year? 0 0 0 0 1  Number falls in past yr: 0 0 0 0 0  Injury with Fall? 0 0 0 0 0  Risk for fall due to : No Fall Risks No Fall Risks No Fall Risks No Fall Risks History of fall(s);Impaired balance/gait;Impaired mobility  Follow up Falls evaluation completed Falls evaluation completed Falls evaluation completed  Falls prevention discussed;Education provided;Falls evaluation completed    MEDICARE RISK AT HOME:  Medicare Risk at Home Any stairs in or around the home?: No If so, are there any without handrails?: No Home free of loose throw rugs in walkways, pet beds, electrical cords, etc?: Yes Adequate lighting in your home to reduce risk of falls?: Yes Life alert?: No Use of a cane, walker or w/c?: Yes Grab bars in the bathroom?: No Shower chair or bench in shower?: No Elevated toilet seat or a handicapped toilet?: No  TIMED UP AND GO:  Was the test performed?  No  Cognitive Function: 6CIT completed    02/16/2024    2:24 PM 02/06/2022    2:45 PM  MMSE - Mini Mental State Exam  Not completed: Unable to complete   Orientation to time  5  Orientation to Place  5  Registration  3  Attention/ Calculation  5  Recall  3  Language-  name 2 objects  2  Language- repeat  1  Language- follow 3 step command  3  Language- read & follow direction  1  Write a sentence  1  Copy design  1  Total score  30        02/16/2024    2:24 PM 02/11/2023    9:46 PM  6CIT Screen  What Year? 0 points 0 points  What month? 0 points 0 points  What time? 0 points 0 points  Count back from 20 0 points 0 points  Months in reverse 0 points 0 points  Repeat phrase 0 points 0 points  Total Score 0 points 0 points    Immunizations Immunization History  Administered Date(s) Administered   Influenza,inj,Quad PF,6+ Mos 06/22/2019   Pneumococcal Polysaccharide-23 08/12/2018    Screening Tests Health Maintenance  Topic Date Due   COVID-19 Vaccine (1 - 2024-25 season) Never done   Diabetic kidney evaluation - Urine ACR  02/03/2024   DTaP/Tdap/Td (1 - Tdap) 08/04/2024 (Originally 06/05/1999)   Pneumococcal Vaccine 25-68 Years old (2 of 2 - PCV) 02/01/2025 (Originally  08/13/2019)   INFLUENZA VACCINE  04/29/2024   OPHTHALMOLOGY EXAM  05/21/2024   HEMOGLOBIN A1C  08/10/2024   FOOT EXAM  12/14/2024   Diabetic kidney evaluation - eGFR measurement  02/08/2025   Medicare Annual Wellness (AWV)  02/15/2025   Hepatitis C Screening  Completed   HIV Screening  Completed   HPV VACCINES  Aged Out   Meningococcal B Vaccine  Aged Out    Health Maintenance  Health Maintenance Due  Topic Date Due   COVID-19 Vaccine (1 - 2024-25 season) Never done   Diabetic kidney evaluation - Urine ACR  02/03/2024   Health Maintenance Items Addressed: Yes Patient aware of current care gaps.  Immunization record was verified by [employer]. Patient is due Diabetic Kidney evaluation-Urine ACR.  Additional Screening:  Vision Screening: Recommended annual ophthalmology exams for early detection of glaucoma and other disorders of the eye.  Dental Screening: Recommended annual dental exams for proper oral hygiene  Community Resource Referral / Chronic Care  Management: CRR required this visit?  No   CCM required this visit?  No   Plan:    I have personally reviewed and noted the following in the patient's chart:   Medical and social history Use of alcohol , tobacco or illicit drugs  Current medications and supplements including opioid prescriptions. Patient is not currently taking opioid prescriptions. Functional ability and status Nutritional status Physical activity Advanced directives List of other physicians Hospitalizations, surgeries, and ER visits in previous 12 months Vitals Screenings to include cognitive, depression, and falls Referrals and appointments  In addition, I have reviewed and discussed with patient certain preventive protocols, quality metrics, and best practice recommendations. A written personalized care plan for preventive services as well as general preventive health recommendations were provided to patient.   Margette Sheldon, LPN   0/98/1191   After Visit Summary: (MyChart) Due to this being a telephonic visit, the after visit summary with patients personalized plan was offered to patient via MyChart   Notes: Patient aware of current care gaps.  Immunization record was verified by [employer]. Patient is due Diabetic Kidney evaluation-Urine ACR.

## 2024-02-17 ENCOUNTER — Other Ambulatory Visit: Payer: Self-pay

## 2024-02-17 NOTE — Patient Outreach (Signed)
 Complex Care Management   Visit Note  02/17/2024  Name:  Bradley [name] MRN: [id number] DOB: 26-Dec-1980  Situation: Referral received for Complex Care Management related to Diabetes  I obtained verbal consent from Patient.  Visit completed with patient  on the phone  Background:   Past Medical History:  Diagnosis Date   ADHD    ADHD (attention deficit hyperactivity disorder)    Bipolar 1 disorder (HCC)    Bipolar disorder (HCC)    Depression    Diabetes mellitus without complication (HCC)    Hyperlipidemia    Morbid obesity (HCC)    Obesity    Panic attack    Varicose veins of both lower extremities with pain     Assessment: Patient Reported Symptoms:  Cognitive Cognitive Status: Able to follow simple commands, Alert and oriented to person, place, and time, Normal speech and language skills      Neurological Neurological Review of Symptoms: Dizziness Neurological Comment: feels he gets out of the beed to fast  HEENT HEENT Symptoms Reported: No symptoms reported      Cardiovascular Cardiovascular Symptoms Reported: No symptoms reported    Respiratory Respiratory Symptoms Reported: Wheezing Additional Respiratory Details: after smoking    Endocrine Patient reports the following symptoms related to hypoglycemia or hyperglycemia : Increased thirst, Increased urination Is patient diabetic?: Yes Is patient checking blood sugars at home?: Yes Endocrine Conditions: Diabetes Endocrine Management Strategies: Medical device, Medication therapy  Gastrointestinal Gastrointestinal Symptoms Reported: No symptoms reported      Genitourinary Genitourinary Symptoms Reported: No symptoms reported    Integumentary Integumentary Symptoms Reported: No symptoms reported    Musculoskeletal Musculoskelatal Symptoms Reviewed: Muscle pain Musculoskeletal Conditions: Other (from neuropathy) Musculoskeletal Management Strategies: Medication therapy Falls in the past year?: No     Psychosocial       Quality of Family Relationships: supportive Do you feel physically threatened by others?: No      02/17/2024    2:16 PM  Depression screen PHQ 2/9  Decreased Interest 1  Down, Depressed, Hopeless 0  PHQ - 2 Score 1    There were no vitals filed for this visit.  Medications Reviewed Today     Reviewed by Augustin Leber, RN (Registered Nurse) on 02/17/24 at 1404  Med List Status: <None>   Medication Order Taking? Sig Documenting Provider Last Dose Status Informant  ARIPiprazole  (ABILIFY ) 5 MG tablet [id number] Yes Take 1 tablet (5 mg total) by mouth at bedtime.  Taking Active   atorvastatin  (LIPITOR) 20 MG tablet [id number] Yes Take 1 tablet (20 mg total) by mouth daily. Newlin, Enobong, MD Taking Active   Blood Glucose Calibration (ACCU-CHEK ACTIVE GLUCOSE CONT VI) [id number] No by In Vitro route.  Patient not taking: Reported on 02/17/2024   [provider] Not Taking Active            Med Note Ples Brim, CARROLL   Tue Jan 06, 2023  2:55 PM) ONE Ivonne Marry is his current monitor.  Continuous Glucose Sensor (FREESTYLE LIBRE 3 SENSOR) [place] [id number] Yes Place 1 sensor on the skin every 14 days. Use to check glucose continuously Shamleffer, Ibtehal Jaralla, MD Taking Active   doxepin  (SINEQUAN ) 25 MG capsule [id number] Yes Take 25 mg by mouth at bedtime as needed. [provider] Taking Active   doxepin  (SINEQUAN ) 50 MG capsule [id number] Yes Take 1 capsule (50 mg total) by mouth as needed for sleep  Taking Active   DULoxetine  (CYMBALTA ) 30 MG capsule [id number]  Take 30 mg  by mouth daily. [provider]  Active   DULoxetine  (CYMBALTA ) 60 MG capsule [id number] Yes Take 1 capsule(s) by mouth at bedtime for anxiety/depression  Taking Active   empagliflozin  (JARDIANCE ) 25 MG TABS tablet [id number] Yes Take 1 tablet (25 mg total) by mouth daily before breakfast. Shamleffer, Ibtehal Jaralla, MD Taking Active   gentamicin  cream (GARAMYCIN ) 0.1 % [id number] No  Apply to wound left lower extremity once daily until healed.  Patient not taking: Reported on 02/17/2024   Luella Sager, DPM Not Taking Active            Med Note [facility], CARROLL   Tue Jan 06, 2023  2:56 PM) Not using at this time.  glipiZIDE  (GLUCOTROL ) 10 MG tablet [id number] Yes Take 1 tablet (10 mg total) by mouth 2 (two) times daily before a meal. Shamleffer, Julian Obey, MD Taking Active   glucose blood (ACCU-CHEK GUIDE) test strip [id number] Yes 1 each by Other route 3 (three) times daily. Use as instructed Shamleffer, Ibtehal Jaralla, MD Taking Active            Med Note Ples Brim, CARROLL   Tue Jan 06, 2023  2:57 PM) One touch  mupirocin  ointment (BACTROBAN ) 2 % [id number] No Apply to right great toe once daily.  Patient not taking: Reported on 02/17/2024   Luella Sager, DPM Not Taking Active   OneTouch Delica Lancets 33G MISC [id number] No 1 Device by Does not apply route 3 (three) times daily.  Patient not taking: Reported on 02/17/2024   Shamleffer, Ibtehal Jaralla, MD Not Taking Active   sildenafil  (VIAGRA ) 50 MG tablet [id number] No Take 1 tablet (50 mg total) by mouth daily as needed for erectile dysfunction. At least 24 hours between doses  Patient not taking: Reported on 02/17/2024   Newlin, Enobong, MD Not Taking Active            Med Note [facility], CARR[place]   Tue Jan 06, 2023  2:59 PM) Would like to have another Rx.  traMADol  (ULTRAM ) 50 MG tablet [id number] Yes Take 1 tablet (50 mg total) by mouth every 12 (twelve) hours as needed. Newlin, Enobong, MD Taking Active   warfarin (COUMADIN ) 2.5 MG tablet [id number] Yes TAKE 1 TABLET BY MOUTH DAILY AS DIRECTED BY THE COUMADIN  CLINIC. Newlin, Enobong, MD Taking Active   Med List Note Murriel Arrow 02/03/12 2125): [facility] [address]            Recommendation:   PCP Follow-up  Follow Up Plan:   Telephone follow up appointment with care management team member scheduled for: 03/23/24 3 pm   Augustin Leber RN,  BSN, [facility] [place]  [facility], [facility]  Care Coordinator Phone: [phone]

## 2024-02-17 NOTE — Patient Instructions (Signed)
 Visit Information  Thank you for taking time to visit with me today. Please don't hesitate to contact me if I can be of assistance to you before our next scheduled appointment.  Your next care management appointment is  03/23/24 3 pm    Please call the care guide team at [phone] if you need to cancel, schedule, or reschedule an appointment.   Please call [phone]-TALK (toll free, 24 hour hotline) if you are experiencing a Mental Health or Behavioral Health Crisis or need someone to talk to.  Augustin Leber RN, BSN, [facility] [place]  [facility], [facility]  Care Coordinator Phone: [phone]

## 2024-02-23 ENCOUNTER — Other Ambulatory Visit: Payer: Self-pay

## 2024-03-07 ENCOUNTER — Other Ambulatory Visit: Payer: Self-pay

## 2024-03-15 ENCOUNTER — Ambulatory Visit: Attending: Family Medicine | Admitting: Pharmacist

## 2024-03-15 ENCOUNTER — Other Ambulatory Visit: Payer: Self-pay

## 2024-03-15 DIAGNOSIS — I825Y2 Chronic embolism and thrombosis of unspecified deep veins of left proximal lower extremity: Secondary | ICD-10-CM | POA: Diagnosis not present

## 2024-03-15 LAB — POCT INR: POC INR: 2.6

## 2024-03-22 ENCOUNTER — Other Ambulatory Visit: Payer: Self-pay

## 2024-03-22 DIAGNOSIS — F3132 Bipolar disorder, current episode depressed, moderate: Secondary | ICD-10-CM | POA: Diagnosis not present

## 2024-03-22 DIAGNOSIS — F4312 Post-traumatic stress disorder, chronic: Secondary | ICD-10-CM | POA: Diagnosis not present

## 2024-03-22 DIAGNOSIS — F411 Generalized anxiety disorder: Secondary | ICD-10-CM | POA: Diagnosis not present

## 2024-03-23 ENCOUNTER — Other Ambulatory Visit: Payer: Self-pay

## 2024-03-23 ENCOUNTER — Telehealth: Payer: Self-pay

## 2024-03-24 ENCOUNTER — Other Ambulatory Visit: Payer: Self-pay

## 2024-03-24 MED ORDER — FREESTYLE LIBRE 3 PLUS SENSOR MISC
3 refills | Status: AC
  Filled 2024-03-24: qty 6, fill #0
  Filled 2024-05-06: qty 2, 30d supply, fill #0
  Filled 2024-06-08: qty 2, 30d supply, fill #1
  Filled 2024-07-06: qty 2, 30d supply, fill #2
  Filled 2024-08-05: qty 2, 30d supply, fill #3
  Filled 2024-09-05: qty 2, 30d supply, fill #4
  Filled 2024-10-04: qty 2, 30d supply, fill #5

## 2024-03-30 ENCOUNTER — Ambulatory Visit: Admitting: Podiatry

## 2024-04-05 ENCOUNTER — Other Ambulatory Visit: Payer: Self-pay

## 2024-04-15 ENCOUNTER — Ambulatory Visit: Attending: Family Medicine | Admitting: Pharmacist

## 2024-04-15 DIAGNOSIS — I825Y2 Chronic embolism and thrombosis of unspecified deep veins of left proximal lower extremity: Secondary | ICD-10-CM

## 2024-04-15 LAB — POCT INR: POC INR: 2.4

## 2024-04-18 ENCOUNTER — Other Ambulatory Visit: Payer: Self-pay

## 2024-04-19 ENCOUNTER — Other Ambulatory Visit: Payer: Self-pay

## 2024-05-02 NOTE — Progress Notes (Signed)
 [facility] - Clinic Note  05/16/2024     CHIEF COMPLAINT Patient presents for Retina Follow Up    HISTORY OF PRESENT ILLNESS: Bradley [name] is a 43 y.o. male who presents to the clinic today for:   HPI     Retina Follow Up   Patient presents with  Diabetic Retinopathy.  In both eyes.  This started 4 years ago.  Duration of 12 months.  Since onset it is stable.  I, the attending physician,  performed the HPI with the patient and updated documentation appropriately.        Comments   Pt states his vision seems more blurry while playing video games. Pt denies FOL/floaters/pain. Pt does not use ats. A1c=8.6, 3 months ago BS=240 right now. Fasting average 140-170      Last edited by Valdemar Rogue, MD on 05/22/2024  3:48 PM.    Patient states that he's noticed a tad bit of blurriness in [place] OU, difficulty reading. Doesn't have a regular eye doctor for glasses. Pt has a hx of trying injectable meds for diabetes but didn't ike injecting himself. On Jardiance  and glipizide .   Referring physician: Delbert Clam, MD [address] [place] [address] [place],  [place] [address]  HISTORICAL INFORMATION:   Selected notes from the MEDICAL RECORD NUMBER    CURRENT MEDICATIONS: No current outpatient medications on file. (Ophthalmic Drugs)   No current facility-administered medications for this visit. (Ophthalmic Drugs)   Current Outpatient Medications (Other)  Medication Sig   ARIPiprazole  (ABILIFY ) 5 MG tablet Take 1 tablet (5 mg total) by mouth at bedtime.   atorvastatin  (LIPITOR) 20 MG tablet Take 1 tablet (20 mg total) by mouth daily.   Blood Glucose Calibration (ACCU-CHEK ACTIVE GLUCOSE CONT VI) by In Vitro route. (Patient not taking: Reported on 05/17/2024)   Continuous Glucose Sensor (FREESTYLE LIBRE 3 PLUS SENSOR) MISC Change sensor every 15 days.   Continuous Glucose Sensor (FREESTYLE LIBRE 3 SENSOR) MISC Place 1 sensor on the skin every 14 days. Use to  check glucose continuously   doxepin  (SINEQUAN ) 25 MG capsule Take 25 mg by mouth at bedtime as needed.   doxepin  (SINEQUAN ) 50 MG capsule Take 1 capsule (50 mg total) by mouth as needed for sleep   DULoxetine  (CYMBALTA ) 30 MG capsule Take 30 mg by mouth daily.   DULoxetine  (CYMBALTA ) 60 MG capsule Take 1 capsule(s) by mouth at bedtime for anxiety/depression   empagliflozin  (JARDIANCE ) 25 MG TABS tablet Take 1 tablet (25 mg total) by mouth daily before breakfast.   gentamicin  cream (GARAMYCIN ) 0.1 % Apply to wound left lower extremity once daily until healed. (Patient not taking: Reported on 05/17/2024)   glipiZIDE  (GLUCOTROL ) 10 MG tablet Take 1 tablet (10 mg total) by mouth 2 (two) times daily before a meal.   glucose blood (ACCU-CHEK GUIDE) test strip 1 each by Other route 3 (three) times daily. Use as instructed (Patient not taking: Reported on 05/17/2024)   mupirocin  ointment (BACTROBAN ) 2 % Apply to right great toe once daily. (Patient not taking: Reported on 05/17/2024)   OneTouch Delica Lancets 33G MISC 1 Device by Does not apply route 3 (three) times daily.   sildenafil  (VIAGRA ) 50 MG tablet Take 1 tablet (50 mg total) by mouth daily as needed for erectile dysfunction. At least 24 hours between doses (Patient not taking: Reported on 05/17/2024)   traMADol  (ULTRAM ) 50 MG tablet Take 1 tablet (50 mg total) by mouth every 12 (twelve) hours  as needed.   warfarin (COUMADIN ) 2.5 MG tablet TAKE 1 TABLET BY MOUTH DAILY AS DIRECTED BY THE COUMADIN  CLINIC.   No current facility-administered medications for this visit. (Other)   REVIEW OF SYSTEMS: ROS   Positive for: Endocrine, Eyes, Respiratory, Psychiatric Negative for: Constitutional, Gastrointestinal, Neurological, Skin, Genitourinary, Musculoskeletal, HENT, Cardiovascular, Allergic/Imm, Heme/Lymph Last edited by Elnor Avelina RAMAN, COT on 05/16/2024  1:12 PM.       ALLERGIES Allergies  Allergen Reactions   Actos  [Pioglitazone ] Other (See  Comments)    Stomach cramps   Gabapentin  Other (See Comments)    Crying spells   Lyrica  Curius.Counts ] Other (See Comments)    Makes the patient somnolent   PAST MEDICAL HISTORY Past Medical History:  Diagnosis Date   ADHD    ADHD (attention deficit hyperactivity disorder)    Bipolar 1 disorder (HCC)    Bipolar disorder (HCC)    Depression    Diabetes mellitus without complication (HCC)    Hyperlipidemia    Morbid obesity (HCC)    Obesity    Panic attack    Varicose veins of both lower extremities with pain    Past Surgical History:  Procedure Laterality Date   surgery on meatus as a child     FAMILY HISTORY Family History  Problem Relation Age of Onset   Rheum arthritis Mother    Diabetes Mother    Heart failure Mother    Stroke Mother    ADD / ADHD Mother    Anxiety disorder Mother    Arthritis Mother    Depression Mother    Drug abuse Mother    Alcohol  abuse Father    Glaucoma Maternal Grandmother    COPD Maternal Grandmother    Lung cancer Paternal Grandmother        smoker   SOCIAL HISTORY Social History   Tobacco Use   Smoking status: Every Day    Current packs/day: 1.00    Average packs/day: 1 pack/day for 15.0 years (15.0 ttl pk-yrs)    Types: Cigarettes   Smokeless tobacco: Never  Vaping Use   Vaping status: Never Used  Substance Use Topics   Alcohol  use: Yes    Alcohol /week: 1.0 standard drink of alcohol     Types: 1 Shots of liquor per week    Comment: Only on my birthday and New Year's   Drug use: No       OPHTHALMIC EXAM:  Base Eye Exam     Visual Acuity (Snellen - Linear)       Right Left   Dist [place] 20/40 20/100   Dist ph [place] 20/30 +2 20/30         Tonometry (Tonopen, 1:17 PM)       Right Left   Pressure 13 15         Pupils       Pupils Dark Light Shape React APD   Right PERRL 5 3 Round Brisk None   Left PERRL 5 3 Round Brisk None         Visual Fields       Left Right    Full Full         Extraocular  Movement       Right Left    Full, Ortho Full, Ortho         Neuro/Psych     Oriented x3: Yes   Mood/Affect: Normal         Dilation     Both eyes: 1.0%  Mydriacyl, 2.5% Phenylephrine @ 1:18 PM           Slit Lamp and Fundus Exam     Slit Lamp Exam       Right Left   Lids/Lashes Normal Normal   Conjunctiva/Sclera White and quiet White and quiet   Cornea Trace Punctate epithelial erosions Trace Punctate epithelial erosions   Anterior Chamber Deep and quiet Deep and quiet   Iris Round and dilated, No NVI Round and dilated, No NVI   Lens Clear Clear   Anterior Vitreous mild syneresis mild syneresis         Fundus Exam       Right Left   Disc Pink and Sharp Pink and Sharp   C/D Ratio 0.2 0.2   Macula Flat, Good foveal reflex, scattered MA greatest perifovea Flat, Good foveal reflex, mild Retinal pigment epithelial mottling, focal MA superior to fovea, no edema   Vessels Tortuous, mild AV crossing changes Tortuous   Periphery Attached, minimal MA Attached, focal punctate IRH just outside IT arcades            IMAGING AND PROCEDURES  Imaging and Procedures for @TODAY @  OCT, Retina - OU - Both Eyes       Right Eye Quality was good. Central Foveal Thickness: 293. Progression has been stable. Findings include normal foveal contour, no IRF, no SRF, intraretinal hyper-reflective material (Focal IRHM ST fovea).   Left Eye Quality was good. Central Foveal Thickness: 302. Progression has been stable. Findings include normal foveal contour, no IRF, no SRF, vitreomacular adhesion .   Notes *Images captured and stored on drive  Diagnosis / Impression:  NFP, no IRF/SRF OU No DME OU OD: Focal IRHM ST fovea  Clinical management:  See below  Abbreviations: NFP - Normal foveal profile. CME - cystoid macular edema. PED - pigment epithelial detachment. IRF - intraretinal fluid. SRF - subretinal fluid. EZ - ellipsoid zone. ERM - epiretinal membrane. ORA - outer  retinal atrophy. ORT - outer retinal tubulation. SRHM - subretinal hyper-reflective material             ASSESSMENT/PLAN:    ICD-10-CM   1. Mild nonproliferative diabetic retinopathy of both eyes without macular edema associated with type 2 diabetes mellitus (HCC)  E11.3293 OCT, Retina - OU - Both Eyes    2. Long term (current) use of oral hypoglycemic drugs  Z79.84     3. Essential hypertension  I10     4. Hypertensive retinopathy of both eyes  H35.033       1,2. Mild nonproliferative diabetic retinopathy OU  - HbA1c 8.6 on 05.12.25, 9.4 (06.24.24), 9.8 (07.26.23), 12.7 (07.18.22) - The incidence, risk factors for progression, natural history and treatment options for diabetic retinopathy were discussed with patient.   - The need for close monitoring of blood glucose, blood pressure, and serum lipids, avoiding cigarette or any type of tobacco, and the need for long term follow up was also discussed with patient. - exam today with focal punctate hemorrhages and microaneurysms -- stable - OCT without diabetic macular edema, both eyes   - f/u in 1 year, sooner prn, DFE, OCT  3,4. Hypertensive retinopathy OU  - discussed importance of tight BP control  - history of PE and DVT -- on coumadin   - monitor   Ophthalmic Meds Ordered this visit:  No orders of the defined types were placed in this encounter.    Return in about 1 year (around 05/16/2025) for NPDR, Dilated Exam,  OCT.  There are no Patient Instructions on file for this visit.  This document serves as a record of services personally performed by Redell JUDITHANN Hans, MD, PhD. It was created on their behalf by Avelina Pereyra, COA an ophthalmic technician. The creation of this record is the provider's dictation and/or activities during the visit.   Electronically signed by: Avelina GORMAN Pereyra, COT  05/22/24  3:49 PM   This document serves as a record of services personally performed by Redell JUDITHANN Hans, MD, PhD. It was created on  their behalf by Almetta Pesa, an ophthalmic technician. The creation of this record is the provider's dictation and/or activities during the visit.    Electronically signed by: Almetta Pesa, OA, 05/22/24  3:49 PM  Redell JUDITHANN Hans, M.D., Ph.D. Diseases & Surgery of the Retina and Vitreous [facility]  I have reviewed the above documentation for accuracy and completeness, and I agree with the above. Redell JUDITHANN Hans, M.D., Ph.D. 05/22/24 3:50 PM   Abbreviations: M myopia (nearsighted); A astigmatism; H hyperopia (farsighted); P presbyopia; Mrx spectacle prescription;  CTL contact lenses; OD right eye; OS left eye; OU both eyes  XT exotropia; ET esotropia; PEK punctate epithelial keratitis; PEE punctate epithelial erosions; DES dry eye syndrome; MGD meibomian gland dysfunction; ATs artificial tears; PFAT's preservative free artificial tears; NSC nuclear sclerotic cataract; PSC posterior subcapsular cataract; ERM epi-retinal membrane; PVD posterior vitreous detachment; RD retinal detachment; DM diabetes mellitus; DR diabetic retinopathy; NPDR non-proliferative diabetic retinopathy; PDR proliferative diabetic retinopathy; CSME clinically significant macular edema; DME diabetic macular edema; dbh dot blot hemorrhages; CWS cotton wool spot; POAG primary open angle glaucoma; C/D cup-to-disc ratio; HVF humphrey visual field; GVF goldmann visual field; OCT optical coherence tomography; IOP intraocular pressure; BRVO Branch retinal vein occlusion; CRVO central retinal vein occlusion; CRAO central retinal artery occlusion; BRAO branch retinal artery occlusion; RT retinal tear; SB scleral buckle; PPV pars plana vitrectomy; VH Vitreous hemorrhage; PRP panretinal laser photocoagulation; IVK intravitreal kenalog; VMT vitreomacular traction; MH Macular hole;  NVD neovascularization of the disc; NVE neovascularization elsewhere; AREDS age related eye disease study; ARMD age related macular  degeneration; POAG primary open angle glaucoma; EBMD epithelial/anterior basement membrane dystrophy; ACIOL anterior chamber intraocular lens; IOL intraocular lens; PCIOL posterior chamber intraocular lens; Phaco/IOL phacoemulsification with intraocular lens placement; PRK photorefractive keratectomy; LASIK laser assisted in situ keratomileusis; HTN hypertension; DM diabetes mellitus; COPD chronic obstructive pulmonary disease

## 2024-05-06 ENCOUNTER — Other Ambulatory Visit: Payer: Self-pay

## 2024-05-10 ENCOUNTER — Telehealth: Payer: Self-pay | Admitting: *Deleted

## 2024-05-10 NOTE — Progress Notes (Signed)
 Complex Care Management Care Guide Note  05/10/2024 Name: EVIN LOISEAU MRN: [id number] DOB: 22-May-1981  [name] is a 43 y.o. year old male who is a primary care patient of Newlin, Enobong, MD and is actively engaged with the care management team. I reached out to [name] by phone today to assist with re-scheduling  with the RN Case Manager.  Follow up plan: Telephone appointment with complex care management team member scheduled for:  8/19 Harlene Satterfield  [facility]  [facility], [facility] Guide  Direct Dial: [phone]  Fax [phone]

## 2024-05-10 NOTE — Progress Notes (Signed)
 Complex Care Management Care Guide Note  05/10/2024 Name: [name] MRN: [id number] DOB: 09-13-1981  [name] is a 43 y.o. year old male who is a primary care patient of Newlin, Enobong, MD and is actively engaged with the care management team. I reached out to [name] by phone today to assist with re-scheduling  with the RN Case Manager.  Follow up plan: Unsuccessful telephone outreach attempt made. A HIPAA compliant phone message was left for the patient providing contact information and requesting a return call.  Harlene Satterfield  [facility]  [facility], [facility] Guide  Direct Dial: [phone]  Fax [phone]

## 2024-05-13 ENCOUNTER — Other Ambulatory Visit: Payer: Self-pay | Admitting: Internal Medicine

## 2024-05-13 ENCOUNTER — Encounter: Attending: Internal Medicine | Admitting: Dietician

## 2024-05-13 ENCOUNTER — Encounter: Payer: Self-pay | Admitting: Dietician

## 2024-05-13 DIAGNOSIS — Z794 Long term (current) use of insulin: Secondary | ICD-10-CM

## 2024-05-13 DIAGNOSIS — E1165 Type 2 diabetes mellitus with hyperglycemia: Secondary | ICD-10-CM | POA: Diagnosis not present

## 2024-05-13 NOTE — Patient Instructions (Addendum)
 Consider Exercise Videos to do in your home such as [employer]ny [employer] walking videos - 30 minutes most days. Mindfulness:  Consistently scheduled meal - avoid skipping  Choices  Eat slowly  Away from distraction (sitting in kitchen or dining room)  Stop eating when satisfied  Before a snack ask, Am I hungry or eating for another reason?   What can I do instead if I am not hungry?  Look at your sensor reading before and after you eat.  What is the affect of food on blood glucose?  Diet quality Move to action Drink more water!

## 2024-05-13 NOTE — Progress Notes (Signed)
 Diabetes Self-Management Education  Visit Type: Follow-up  Appt. Start Time: 1515 Appt. End Time: 1545  05/13/2024  [name], identified by name and date of birth, is a 43 y.o. male with a diagnosis of Diabetes:  .   ASSESSMENT Patient is here today with his girlfriend.  He was last seen by myself 02/11/2024.   He states that he at >70 rice krispie treats over the past 2 weeks.  This caused GI distress.  He stated he will not do that again.  He is now part of the research study at [facility] and they are providing patient with the [employer] 3. He is looking at his CGM readings prior to meals Too chaotic to go to the gym.  Girlfriend is caring for girlfriend's aunt and she is [employer]'s transportation. He does not feel safe to walk in his neighborhood. Today worked on overcoming barriers to change. Discussed nutrition and importance of diet quality.    He completed the PREP class [employer] program which meets Monday and Wednesday for 3 months. Since finishing he is walking the dog most days and going to gym on Friday's. Decreased smoking to 1/2 pack but has increased vaping.  He cannot find a lower nicotine  vape and does not like the zero nicotine  vape. States he has gained weight due to medications but also food choices. Likes little vegetables and does not wish to try more options.     History includes Type 2 Diabetes (2019), DVT, Bipolar, ADHD, neuropathy, smoking, memory issues, polycythemia, thrombocytopenia, history of DVT/PE, nonproliferative retinopathy Labs noted to include:  A1C 8.6% on 02/08/2024, 8.5% 10/08/2022, 7.9% 08/05/2023, 9.4% 03/24/2023  cholesterol 193, Triglycerides 365, HDL 40, LDL 92 08/09/2020 Medications include:Trulicity (stopped), glipizide , actos  (discontinued), Jardiance , Tresiba  70 units q HS (not taking), Novolog  before meals (not taking), coumadin , Abilify  and other medications that contribute to weight gain. He stopped  pioglitizone after 3 days due to stomach cramps. He could not tolerated Metformin  due to bowel incontinence.  CGM:  Libre 3  - 257 now   CGM Results from download: 02/11/2024 05/13/2024  % Time CGM active:   74 %   (Goal >70%) 72  Average glucose:   218 mg/dL for 14 days 766 x 14  Glucose management indicator:   8.5 % 8.9%  Time in range (70-180 mg/dL):   76%   (Goal >29%) 21  Time High (181-250 mg/dL):   52 %   (Goal < 74%) 47  Time Very High (>250 mg/dL):    74%   (Goal < 5%) 32  Time Low (54-69 mg/dL):   0 %   (Goal <5%) 0  Time Very Low (<54 mg/dL):   0 %   (Goal <8%) 0  %CV (glucose variability)     %  (Goal <36%)       Sleep:  Eats late (10pm - midnight).  Goes to sleep late.  Wakes about 10:30 am (although later now) Support:  [facility] [facility], patient of Dr. Sam at [facility], PCP Corrina Sabin, MD   Weight: 311 lbs 05/13/2024 and 02/11/2024  Patient states he gained weight due to the Glimepiride  (now changed to Glipizide ) 300 lbs 10/20/2023 269 lbs 05/29/2023 263 lbs 03/06/2023 262 lbs 12/08/2022 260 lbs 09/08/2022 261 lbs 07/21/2022 263 lbs 04/21/2022 278 lbs 12/19/2021 279 lbs 10/24/2021 282 lbs 08/16/2021 loss likely due to uncontrolled blood glucose 295 lbs 02/14/21 306 lbs 47/2022 306 lbs 11/23/2020 425 lbs  highest adult weight 2 years ago (loss presumably due to uncontrolled blood sugar) Lowest adult weight 235 lbs about 8 years ago and he does not recall circumstances of increased weight gain.   Patient lives with his girlfriend.  She has type 2 diabetes.  He does the shopping and cooking.  Dislikes most vegetables. Drinking mostly sugar free options. Likes, green beans, peas, corn and no other vegetables He is on disability due to mental health.  Finances are a concern. His mother passed away December 17, 2020 from complications of diabetes. Girlfriend  goes to the gym 2-3 times per week [facility]).  Patient is not motivated to  go. Complains of neuropathy pain which worsens with exercise.    Diabetes Self-Management Education - 05/13/24 1357       Visit Information   Visit Type Follow-up      Psychosocial Assessment   Patient Belief/Attitude about Diabetes Other (comment)    What is the hardest part about your diabetes right now, causing you the most concern, or is the most worrisome to you about your diabetes?   Making healty food and beverage choices;Being active    Self-care barriers None    Self-management support Doctor's office;Family    Other persons present Patient    Patient Concerns Nutrition/Meal planning;Healthy Lifestyle;Glycemic Control    Special Needs None    Preferred Learning Style No preference indicated    Learning Readiness Not Ready    How often do you need to have someone help you when you read instructions, pamphlets, or other written materials from your doctor or pharmacy? 3 - Sometimes      Pre-Education Assessment   Patient understands the diabetes disease and treatment process. Comprehends key points    Patient understands incorporating nutritional management into lifestyle. Needs Review    Patient undertands incorporating physical activity into lifestyle. Needs Review    Patient understands using medications safely. Needs Review    Patient understands monitoring blood glucose, interpreting and using results Needs Review    Patient understands prevention, detection, and treatment of acute complications. Needs Review    Patient understands prevention, detection, and treatment of chronic complications. Needs Review    Patient understands how to develop strategies to address psychosocial issues. Needs Review    Patient understands how to develop strategies to promote health/change behavior. Needs Review      Complications   How often do you check your blood sugar? > 4 times/day    Fasting Blood glucose range (mg/dL) 869-820;819-799    Postprandial Blood glucose range (mg/dL) >799     Number of hypoglycemic episodes per month 0    Number of hyperglycemic episodes ( >200mg /dL): Daily    Can you tell when your blood sugar is high? Yes    What do you do if your blood sugar is high? drinking more water      Dietary Intake   Breakfast 2 hot dogs (plain)    Snack (morning) coffee and cookie    Lunch 2 ham and chese sandwiches on honey wheat    Snack (afternoon) none    Dinner 3 pieces fried chicken, mac and cheese, 2 biscuits    Snack (evening) none    Beverage(s) Propel, 1 regular Monster every 2 days, diet soda, rare sweet tea      Activity / Exercise   Activity / Exercise Type ADL's      Patient Education   Previous Diabetes Education Yes   01/2024   Disease Pathophysiology Explored patient's options  for treatment of their diabetes    Healthy Eating Meal options for control of blood glucose level and chronic complications.;Plate Method;Reviewed blood glucose goals for pre and post meals and how to evaluate the patients' food intake on their blood glucose level.    Being Active Role of exercise on diabetes management, blood pressure control and cardiac health.;Helped patient identify appropriate exercises in relation to his/her diabetes, diabetes complications and other health issue.    Monitoring Taught/evaluated CGM (comment)    Acute complications Discussed and identified patients' prevention, symptoms, and treatment of hyperglycemia.    Chronic complications Relationship between chronic complications and blood glucose control;Identified and discussed with patient  current chronic complications    Diabetes Stress and Support Identified and addressed patients feelings and concerns about diabetes;Worked with patient to identify barriers to care and solutions    Lifestyle and Health Coping Lifestyle issues that need to be addressed for better diabetes care      Individualized Goals (developed by patient)   Nutrition General guidelines for healthy choices and portions  discussed    Physical Activity Exercise 5-7 days per week;30 minutes per day    Medications take my medication as prescribed    Monitoring  Consistenly use CGM    Problem Solving Eating Pattern;Addressing barriers to behavior change    Reducing Risk examine blood glucose patterns;stop smoking;do foot checks daily;treat hypoglycemia with 15 grams of carbs if blood glucose less than 70mg /dL      Patient Self-Evaluation of Goals - Patient rates self as meeting previously set goals (% of time)   Nutrition 25 - 50% (sometimes)    Physical Activity < 25% (hardly ever/never)    Medications 25 - 50% (sometimes)    Monitoring >75% (most of the time)    Problem Solving and behavior change strategies  25 - 50% (sometimes)    Reducing Risk (treating acute and chronic complications) 25 - 50% (sometimes)    Health Coping 25 - 50% (sometimes)      Post-Education Assessment   Patient understands the diabetes disease and treatment process. Comprehends key points    Patient understands incorporating nutritional management into lifestyle. Needs Review    Patient undertands incorporating physical activity into lifestyle. Comprehends key points    Patient understands using medications safely. Needs Review    Patient understands monitoring blood glucose, interpreting and using results Needs Review    Patient understands prevention, detection, and treatment of acute complications. Needs Review    Patient understands prevention, detection, and treatment of chronic complications. Needs Review    Patient understands how to develop strategies to address psychosocial issues. Needs Review    Patient understands how to develop strategies to promote health/change behavior. Needs Review      Outcomes   Expected Outcomes Demonstrated limited interest in learning.  Expect minimal changes    Future DMSE 2 months    Program Status Not Completed      Subsequent Visit   Since your last visit have you continued or begun  to take your medications as prescribed? No    Since your last visit have you had your blood pressure checked? No    Since your last visit have you experienced any weight changes? No change          Individualized Plan for Diabetes Self-Management Training:   Learning Objective:  Patient will have a greater understanding of diabetes self-management. Patient education plan is to attend individual and/or group sessions per assessed needs and concerns.  Plan:   Patient Instructions  Consider Exercise Videos to do in your home such as Sonny Carota walking videos - 30 minutes most days. Mindfulness:  Consistently scheduled meal - avoid skipping  Choices  Eat slowly  Away from distraction (sitting in kitchen or dining room)  Stop eating when satisfied  Before a snack ask, Am I hungry or eating for another reason?   What can I do instead if I am not hungry?  Look at your sensor reading before and after you eat.  What is the affect of food on blood glucose?  Diet quality Move to action Drink more water!  Expected Outcomes:  Demonstrated limited interest in learning.  Expect minimal changes  Education material provided:   If problems or questions, patient to contact team via:  Phone  Future DSME appointment: 2 months

## 2024-05-16 ENCOUNTER — Ambulatory Visit (INDEPENDENT_AMBULATORY_CARE_PROVIDER_SITE_OTHER): Payer: Medicare HMO | Admitting: Ophthalmology

## 2024-05-16 ENCOUNTER — Encounter (INDEPENDENT_AMBULATORY_CARE_PROVIDER_SITE_OTHER): Payer: Self-pay | Admitting: Ophthalmology

## 2024-05-16 DIAGNOSIS — E113293 Type 2 diabetes mellitus with mild nonproliferative diabetic retinopathy without macular edema, bilateral: Secondary | ICD-10-CM | POA: Diagnosis not present

## 2024-05-16 DIAGNOSIS — Z7984 Long term (current) use of oral hypoglycemic drugs: Secondary | ICD-10-CM | POA: Diagnosis not present

## 2024-05-16 DIAGNOSIS — H35033 Hypertensive retinopathy, bilateral: Secondary | ICD-10-CM

## 2024-05-16 DIAGNOSIS — I10 Essential (primary) hypertension: Secondary | ICD-10-CM

## 2024-05-17 ENCOUNTER — Other Ambulatory Visit: Payer: Self-pay

## 2024-05-17 ENCOUNTER — Other Ambulatory Visit: Payer: Self-pay | Admitting: *Deleted

## 2024-05-17 NOTE — Patient Instructions (Signed)
 Visit Information  Thank you for taking time to visit with me today. Please don't hesitate to contact me if I can be of assistance to you before our next scheduled appointment.  Our next appointment is by telephone on 06/01/24 at 1:30 pm Please call the care guide team at [phone] if you need to cancel or reschedule your appointment.   Following is a copy of your care plan:   Goals Addressed             This Visit's Progress    VBCI RN Care Plan       Problems:  Chronic Disease Management support and education needs related to DMII  Goal: Over the next 90 days the Patient will attend all scheduled medical appointments: with PCP- 08/08/24 and Specialist: Endocrinology -06/13/24, Podiatry- 07/12/24 Nutrition/Diabetes- 07/29/24 as evidenced by keeping all appointments        continue to work with RN Care Manager and/or Social Worker to address care management and care coordination needs related to DMII as evidenced by adherence to care management team scheduled appointments     take all medications exactly as prescribed and will call provider for medication related questions as evidenced by compliance.    verbalize basic understanding of DMII disease process and self health management plan as evidenced by verbal explanation, recognize and monitor symptoms/life style changes.  Interventions:   Diabetes Interventions: Assessed patient's understanding of A1c goal: <7% Provided education to patient about basic DM disease process Reviewed medications with patient and discussed importance of medication adherence Counseled on importance of regular laboratory monitoring as prescribed Discussed plans with patient for ongoing care management follow up and provided patient with direct contact information for care management team Provided patient with written educational materials related to hypo and hyperglycemia and importance of correct treatment Screening for signs and symptoms of depression  related to chronic disease state  Assessed social determinant of health barriers Lab Results  Component Value Date   HGBA1C 8.6 (A) 02/08/2024    Patient Self-Care Activities:  Attend all scheduled provider appointments Call pharmacy for medication refills 3-7 days in advance of running out of medications Call provider office for new concerns or questions  Take medications as prescribed   keep appointment with eye doctor check blood sugar at prescribed times: before meals and at bedtime check feet daily for cuts, sores or redness enter blood sugar readings and medication or insulin  into daily log limit fast food meals to no more than 1 per week wash and dry feet carefully every day  Plan:  Telephone follow up appointment with care management team member scheduled for:  06/01/2024 at 1:30 pm.              Please call the Suicide and Crisis Lifeline: 988 call the [place]  National Suicide Prevention Lifeline: (747) 569-5708 or TTY: (847)746-3513 TTY (781) 641-3109) to talk to a trained counselor call 1-800-273-TALK (toll free, 24 hour hotline) go to [facility] [address] [place] [phone]) call the [facility] Crisis Line: [phone] call 911 if you are experiencing a Mental Health or Behavioral Health Crisis or need someone to talk to.  Patient verbalizes understanding of instructions and care plan provided today and agrees to view in MyChart. Active MyChart status and patient understanding of how to access instructions and care plan via MyChart confirmed with patient.     Craige Patel, RN, BSN, Theatre manager [employer] [phone]

## 2024-05-17 NOTE — Patient Outreach (Signed)
 Complex Care Management   Visit Note  05/17/2024  Name:  Bradley [name] MRN: [id number] DOB: [birth year]  Situation: Referral received for Complex Care Management related to Diabetes with Complications I obtained verbal consent from Patient.  Visit completed with Patient  on the phone  Background:   Past Medical History:  Diagnosis Date   ADHD    ADHD (attention deficit hyperactivity disorder)    Bipolar 1 disorder (HCC)    Bipolar disorder (HCC)    Depression    Diabetes mellitus without complication (HCC)    Hyperlipidemia    Morbid obesity (HCC)    Obesity    Panic attack    Varicose veins of both lower extremities with pain     Assessment: Patient Reported Symptoms:  Cognitive Cognitive Status: Alert and oriented to person, place, and time, Insightful and able to interpret abstract concepts, Normal speech and language skills   Health Maintenance Behaviors: Sleep adequate, Annual physical exam, Healthy diet, Stress management Healing Pattern: Average Health Facilitated by: Stress management, Rest, Healthy diet, Pain control  Neurological Neurological Review of Symptoms: No symptoms reported Neurological Management Strategies: Routine screening Neurological Self-Management Outcome: 4 (good)  HEENT HEENT Symptoms Reported: Nasal discharge, Sore throat (Reports seasonal allergies.  Reports taking Nyquil.) HEENT Management Strategies: Routine screening, Medication therapy HEENT Self-Management Outcome: 4 (good)    Cardiovascular Cardiovascular Symptoms Reported: No symptoms reported Does patient have uncontrolled Hypertension?: No Cardiovascular Management Strategies: Adequate rest, Routine screening  Respiratory Respiratory Symptoms Reported: Wheezing Other Respiratory Symptoms: Reports that Pulmonary testing has been ordered by provider. Respiratory Management Strategies: Routine screening (I have informed patient to inform provider of increased SOB or to to emergency  room is wheezing continues.) Respiratory Self-Management Outcome: 4 (good)  Endocrine Is patient diabetic?: Yes Is patient checking blood sugars at home?: Yes List most recent blood sugar readings, include date and time of day: BS checked while on the phone with Case Manger.  Patient reports that he eat breakfast about 1 1/2 ago.  BS 290. Endocrine Self-Management Outcome: 3 (uncertain) Endocrine Comment: Lowest- 70 Highest 250  Gastrointestinal Gastrointestinal Symptoms Reported: No symptoms reported Gastrointestinal Management Strategies: Adequate rest Gastrointestinal Self-Management Outcome: 4 (good)    Genitourinary Genitourinary Symptoms Reported: No symptoms reported Genitourinary Management Strategies: Adequate rest Genitourinary Self-Management Outcome: 4 (good)  Integumentary Integumentary Symptoms Reported: No symptoms reported Skin Management Strategies: Routine screening Skin Self-Management Outcome: 4 (good)  Musculoskeletal Musculoskelatal Symptoms Reviewed: Back pain Additional Musculoskeletal Details: Reports back pain.  Report pain leve a 2/10 on pain scale. Reports that he takes Tylenol  for back pain. Musculoskeletal Management Strategies: Medication therapy, Routine screening, Weight management Musculoskeletal Self-Management Outcome: 3 (uncertain) Falls in the past year?: No Number of falls in past year: 1 or less Was there an injury with Fall?: No Fall Risk Category Calculator: 0 Patient Fall Risk Level: Low Fall Risk Patient at Risk for Falls Due to: No Fall Risks  Psychosocial Psychosocial Symptoms Reported: Anxiety - if selected complete GAD, Sadness - if selected complete PHQ 2-9 Behavioral Management Strategies: Counseling, Medication therapy, Coping strategies, Support system Behavioral Health Self-Management Outcome: 3 (uncertain) Major Change/Loss/Stressor/Fears (CP): Medical condition, family (Family memebers had a stroke and has had some  falls.) Techniques to [employer] with Loss/Stress/Change: Counseling, Diversional activities, Medication, Support group Quality of Family Relationships: helpful, involved, supportive Do you feel physically threatened by others?: No    05/17/2024    PHQ2-9 Depression Screening   Little interest or pleasure in doing things Several  days  Feeling down, depressed, or hopeless Several days  PHQ-2 - Total Score 2  Trouble falling or staying asleep, or sleeping too much Not at all  Feeling tired or having little energy Several days  Poor appetite or overeating  Not at all  Feeling bad about yourself - or that you are a failure or have let yourself or your family down Several days  Trouble concentrating on things, such as reading the newspaper or watching television Several days  Moving or speaking so slowly that other people could have noticed.  Or the opposite - being so fidgety or restless that you have been moving around a lot more than usual Not at all  Thoughts that you would be better off dead, or hurting yourself in some way Not at all  PHQ2-9 Total Score 5  If you checked off any problems, how difficult have these problems made it for you to do your work, take care of things at home, or get along with other people Somewhat difficult  Depression Interventions/Treatment Medication, Counseling, Currently on Treatment    There were no vitals filed for this visit.  Medications Reviewed Today     Reviewed by Jorja Nichole LABOR, RN (Case Manager) on 05/17/24 at 1531  Med List Status: <None>   Medication Order Taking? Sig Documenting Provider Last Dose Status Informant  ARIPiprazole  (ABILIFY ) 5 MG tablet [id number] Yes Take 1 tablet (5 mg total) by mouth at bedtime.   Active   atorvastatin  (LIPITOR) 20 MG tablet [id number] Yes Take 1 tablet (20 mg total) by mouth daily. Newlin, Enobong, MD  Active   Blood Glucose Calibration (ACCU-CHEK ACTIVE GLUCOSE CONT VI) [id number]  by In Vitro route.  Patient not  taking: Reported on 05/17/2024   [provider]  Active            Med Note ARBY, CARROLL   Tue Jan 06, 2023  2:55 PM) ONE NORMA is his current monitor.  Continuous Glucose Sensor (FREESTYLE LIBRE 3 PLUS SENSOR) MISC [id number] Yes Change sensor every 15 days. Shamleffer, Donell Cardinal, MD  Active   Continuous Glucose Sensor (FREESTYLE LIBRE 3 SENSOR) [place] [id number] Yes Place 1 sensor on the skin every 14 days. Use to check glucose continuously Shamleffer, Ibtehal Jaralla, MD  Active   doxepin  (SINEQUAN ) 25 MG capsule [id number] Yes Take 25 mg by mouth at bedtime as needed. [provider]  Active   doxepin  (SINEQUAN ) 50 MG capsule [id number] Yes Take 1 capsule (50 mg total) by mouth as needed for sleep   Active   DULoxetine  (CYMBALTA ) 30 MG capsule [id number] Yes Take 30 mg by mouth daily. [provider]  Active   DULoxetine  (CYMBALTA ) 60 MG capsule [id number] Yes Take 1 capsule(s) by mouth at bedtime for anxiety/depression   Active   empagliflozin  (JARDIANCE ) 25 MG TABS tablet [id number] Yes Take 1 tablet (25 mg total) by mouth daily before breakfast. Shamleffer, Ibtehal Jaralla, MD  Active   gentamicin  cream (GARAMYCIN ) 0.1 % [id number]  Apply to wound left lower extremity once daily until healed.  Patient not taking: Reported on 05/17/2024   Gaynel Delon CROME, DPM  Active            Med Note ARBY, CARROLL   Tue Jan 06, 2023  2:56 PM) Not using at this time.  glipiZIDE  (GLUCOTROL ) 10 MG tablet [id number] Yes Take 1 tablet (10 mg total) by mouth 2 (two) times daily before a meal. Shamleffer, Donell Cardinal, MD  Active  glucose blood (ACCU-CHEK GUIDE) test strip [id number]  1 each by Other route 3 (three) times daily. Use as instructed  Patient not taking: Reported on 05/17/2024   Shamleffer, Ibtehal Jaralla, MD  Active            Med Note [facility], CARROLL   Tue Jan 06, 2023  2:57 PM) One touch  mupirocin  ointment (BACTROBAN ) 2 % [id number]  Apply to right great toe  once daily.  Patient not taking: Reported on 05/17/2024   Gaynel Delon CROME, DPM  Active   OneTouch Delica Lancets 33G MISC [id number] Yes 1 Device by Does not apply route 3 (three) times daily. Shamleffer, Ibtehal Jaralla, MD  Active   sildenafil  (VIAGRA ) 50 MG tablet [id number]  Take 1 tablet (50 mg total) by mouth daily as needed for erectile dysfunction. At least 24 hours between doses  Patient not taking: Reported on 05/17/2024   Newlin, Enobong, MD  Active            Med Note [facility], CARROLL   Tue Jan 06, 2023  2:59 PM) Would like to have another Rx.  traMADol  (ULTRAM ) 50 MG tablet [id number] Yes Take 1 tablet (50 mg total) by mouth every 12 (twelve) hours as needed. Newlin, Enobong, MD  Active   warfarin (COUMADIN ) 2.5 MG tablet [id number] Yes TAKE 1 TABLET BY MOUTH DAILY AS DIRECTED BY THE COUMADIN  CLINIC. Newlin, Enobong, MD  Active   Med List Note Johnice Powell JULIANNA Bishop 02/03/12 2125): [facility] [address]            Recommendation:   PCP Follow-up Specialty provider follow-up Endocrinology- 06/13/24, Podiatry- 07/12/24, Nutrition/Diabetes- 10/31 Continue Current Plan of Care  Follow Up Plan:   Telephone follow-up 2 weeks on 06-01-24 at 1:30 pm  Malikye Reppond, RN, BSN, ACM RN Care Manager [employer] [phone]

## 2024-05-20 ENCOUNTER — Encounter: Payer: Self-pay | Admitting: Pharmacist

## 2024-05-20 ENCOUNTER — Ambulatory Visit: Attending: Family Medicine | Admitting: Pharmacist

## 2024-05-20 DIAGNOSIS — I825Y2 Chronic embolism and thrombosis of unspecified deep veins of left proximal lower extremity: Secondary | ICD-10-CM

## 2024-05-20 DIAGNOSIS — Z7984 Long term (current) use of oral hypoglycemic drugs: Secondary | ICD-10-CM

## 2024-05-20 DIAGNOSIS — E1165 Type 2 diabetes mellitus with hyperglycemia: Secondary | ICD-10-CM

## 2024-05-20 LAB — POCT GLYCOSYLATED HEMOGLOBIN (HGB A1C): HbA1c, POC (controlled diabetic range): 9.1 % — AB (ref 0.0–7.0)

## 2024-05-20 LAB — POCT INR: POC INR: 3.1

## 2024-05-20 NOTE — Progress Notes (Signed)
 S:     No chief complaint on file.  43 y.o. male who presents for diabetes evaluation, education, and management in the context of the LIBERATE Study. Today, patient arrives in good spirits and presents without any assistance.   PMH is significant for T2DM complicated by neuropathy, HTN, chronic VTE (anticoagulated on Warfarin), bipolar I, morbid obesity, tobacco use.   Patient was referred and last seen by Primary Care Provider, Dr. Delbert, on 02/02/2024. His DM is managed by his Endocrinologist, Dr. Sam. Patient confirms he will attend follow-up appointment next month.  Current diabetes medications include: glipizide  10 mg BID, Jardiance  25 mg daily   Patient reports adherence to taking all medications as prescribed.   Insurance coverage: [employer]  Patient denies hypoglycemic events.  CGM Study Consent: Yes Study visit: 6 Month Follow Up Visit  CGM Data Download date: 05/20/2024; 90-day report % Time CGM Is Active: 96 % Average glucose (mg/dL): 764 mg/dL Glucose Management Indicator (%): 8.9 % Glucose Variability (%): 24.7 % Time Above Range >180 mg/dL (%): 82 % Time in Range 70-180 mg/dL (%): 18 % Time Below Range <70 mg/dL (%): 0 %  Diabetes Distress Scale Feeling like diabetes is taking up too much of my mental and physical energy every day.: A slight problem Feeling that my doctor doesn't know enough about diabetes and diabetic care. : Not a problem Feeling angry, scared, and/or depressed when I think about living with diabetes : A slight problem Feeling that my doctor doesn't give my clear directions on how to manage my diabetes. : Not a problem Feeling that im not testing my blood sugars frequently enough.: A slight problem Feeling that I'm often failing with my diabetes routine: A moderate problem Feelling that friends and family are not supportive enough of self care efforts.: A slight problem Feeling that diabetes controls my life.: A slight problem Feeling  that my doctor doesn't take my concerns seriously enough: Not a problem Not feeling confident in my day to day ability to manage diabetes: Not a problem Feeling that I will end up with serious long term complications no matter what I do.: A slight problem Feeling that I am not sticking closely enough to a good meal plan.: Not a problem Feeling that friends or family don't appreciate how difficult living with diabetes can be. : A slight problem Feeling overwhelmed by the demands of living with diabetes.: Not a problem Feeling that I don't have a doctor who I can see.: Not a problem Not feeling motivated to keep up my diabetes self management.: A slight problem Feeling that friends or family don't give me the emotional support that I would like. : Not a problem DDS17 Score: 27 Emotional Burden Score: 1.8 Physician related distress score: 1 Regimen Related Distress score : 1.8 Interpersonal distress score: 1.67    Patient denies nocturia (nighttime urination).  Patient reports baseline neuropathy (nerve pain). Patient denies visual changes. Patient denies self foot exams.   O:   Lab Results  Component Value Date   HGBA1C 9.1 (A) 05/20/2024     There were no vitals filed for this visit.   Lipid Panel     Component Value Date/Time   CHOL 126 02/09/2024 1447   TRIG 129 02/09/2024 1447   HDL 46 02/09/2024 1447   CHOLHDL 3.9 12/06/2020 1456   CHOLHDL 4.0 11/12/2016 1613   VLDL 28 11/12/2016 1613   LDLCALC 57 02/09/2024 1447    Clinical Atherosclerotic Cardiovascular Disease (ASCVD): No  The ASCVD Risk score (Arnett DK, et al., 2019) failed to calculate for the following reasons:   The valid total cholesterol range is 130 to 320 mg/dL   Patient is participating in a Managed Medicaid Plan: No     A/P:  LIBERATE Study:  - Discussed options for obtaining continued supply of Libre 3 sensors.   Diabetes longstanding currently above goal. A1c has uptrwended since last visit.  Pt agrees to keep upcoming appt with Dr. Sam next month. Patient is not currently symptomatic but is able to verbalize appropriate hypoglycemia management plan. Medication adherence appears to be appropriate.  -Continued current regimen. -Extensively discussed pathophysiology of diabetes, recommended lifestyle interventions, dietary effects on blood sugar control.  -Counseled on s/sx of and management of hypoglycemia.  -Next A1c anticipated 11/25.   Written patient instructions provided. Patient verbalized understanding of treatment plan.  Total time in face to face counseling 30 minutes.    Follow-up:  Pharmacist 1 month for inr.  Herlene Fleeta Morris, PharmD, JAQUELINE, CPP Clinical Pharmacist [facility] [phone]

## 2024-05-22 ENCOUNTER — Encounter (INDEPENDENT_AMBULATORY_CARE_PROVIDER_SITE_OTHER): Payer: Self-pay | Admitting: Ophthalmology

## 2024-05-31 ENCOUNTER — Other Ambulatory Visit: Payer: Self-pay | Admitting: Pharmacist

## 2024-05-31 ENCOUNTER — Other Ambulatory Visit: Payer: Self-pay

## 2024-05-31 MED ORDER — WARFARIN SODIUM 2.5 MG PO TABS
2.5000 mg | ORAL_TABLET | Freq: Every day | ORAL | 1 refills | Status: AC
  Filled 2024-05-31: qty 90, 90d supply, fill #0
  Filled 2024-09-05: qty 90, 90d supply, fill #1

## 2024-06-01 ENCOUNTER — Other Ambulatory Visit: Payer: Self-pay | Admitting: *Deleted

## 2024-06-01 NOTE — Patient Instructions (Addendum)
 Bradley [name] - I am sorry I was unable to reach you today for our scheduled appointment. I work with Newlin, Enobong, MD and am calling to support your healthcare needs. Please contact me at [phone] at your earliest convenience. I look forward to speaking with you soon. I have rescheduled you for 06/10/24 @ 1:30 pm.  Please call me at [phone] if this date and time does not work for you.    Thank you,   Leata Dominy, RN, BSN, ACM RN Care Manager [employer] [phone]

## 2024-06-10 ENCOUNTER — Other Ambulatory Visit: Payer: Self-pay

## 2024-06-10 ENCOUNTER — Other Ambulatory Visit: Payer: Self-pay | Admitting: *Deleted

## 2024-06-10 NOTE — Patient Instructions (Addendum)
 Visit Information  Thank you for taking time to visit with me today. Please don't hesitate to contact me if I can be of assistance to you before our next scheduled appointment.  Your next care management appointment is by telephone on 07/11/24 at 3 pm  Please call the care guide team at [phone] if you need to cancel, schedule, or reschedule an appointment.   Please call the Suicide and Crisis Lifeline: 988 call the [place]  National Suicide Prevention Lifeline: (623)878-2046 or TTY: 8080441114 TTY (727)201-0949) to talk to a trained counselor call 1-800-273-TALK (toll free, 24 hour hotline) go to [facility] [address], [place] [phone]) call 911 if you are experiencing a Mental Health or Behavioral Health Crisis or need someone to talk to.  Yocelin Vanlue, RN, BSN, Theatre manager [employer] [phone]

## 2024-06-10 NOTE — Patient Outreach (Signed)
 Complex Care Management   Visit Note  06/10/2024  Name:  Bradley [name] MRN: [id number] DOB: Nov 20, 1980  Situation: Referral received for Complex Care Management related to Diabetes I obtained verbal consent from Patient.  Visit completed with Patient  on the phone  Background:   Past Medical History:  Diagnosis Date   ADHD    ADHD (attention deficit hyperactivity disorder)    Bipolar 1 disorder (HCC)    Bipolar disorder (HCC)    Depression    Diabetes mellitus without complication (HCC)    Hyperlipidemia    Morbid obesity (HCC)    Obesity    Panic attack    Varicose veins of both lower extremities with pain     Assessment: Patient Reported Symptoms:  Cognitive Cognitive Status: Able to follow simple commands, Alert and oriented to person, place, and time, Normal speech and language skills, Insightful and able to interpret abstract concepts Cognitive/Intellectual Conditions Management [RPT]: None reported or documented in medical history or problem list   Health Maintenance Behaviors: Sleep adequate, Annual physical exam, Healthy diet, Stress management Healing Pattern: Average Health Facilitated by: Stress management, Rest, Pain control  Neurological Neurological Review of Symptoms: No symptoms reported Neurological Management Strategies: Adequate rest Neurological Self-Management Outcome: 4 (good)  HEENT HEENT Symptoms Reported: No symptoms reported HEENT Management Strategies: Routine screening HEENT Self-Management Outcome: 4 (good)    Cardiovascular Cardiovascular Symptoms Reported: No symptoms reported Cardiovascular Management Strategies: Adequate rest, Routine screening Cardiovascular Self-Management Outcome: 4 (good)  Respiratory Respiratory Symptoms Reported: No symptoms reported Respiratory Management Strategies: Routine screening Respiratory Self-Management Outcome: 4 (good)  Endocrine Endocrine Symptoms Reported: Hyperglycemia Is patient diabetic?:  Yes Is patient checking blood sugars at home?: Yes List most recent blood sugar readings, include date and time of day: Patient reports that hs BS was 255 fasting.  He reports that he recently had a birthday and has been eating cake and sweet items to celebrate his birthday. Discuss eating small portions of sweets to prevent large spikes in blood sugar. Endocrine Self-Management Outcome: 3 (uncertain) Endocrine Comment: Reports Highest has been above 400 and lowest-148.  Mrs. Shaff attitubutes the high bloos sugar levels to eating cake and oher sweet items for his birthday.  Gastrointestinal Gastrointestinal Symptoms Reported: No symptoms reported Gastrointestinal Management Strategies: Adequate rest Gastrointestinal Self-Management Outcome: 4 (good)    Genitourinary Genitourinary Symptoms Reported: No symptoms reported Genitourinary Management Strategies: Adequate rest Genitourinary Self-Management Outcome: 4 (good)  Integumentary Integumentary Symptoms Reported: No symptoms reported Skin Management Strategies: Routine screening  Musculoskeletal Musculoskelatal Symptoms Reviewed: Back pain Additional Musculoskeletal Details: Reports occasional back pain.  He reports that he takes Ultram  and Tylenol  for back pain.  Reports no pain today.   Falls in the past year?: No Number of falls in past year: 1 or less Was there an injury with Fall?: No Fall Risk Category Calculator: 0 Patient Fall Risk Level: Low Fall Risk Patient at Risk for Falls Due to: No Fall Risks Fall risk Follow up: Falls evaluation completed  Psychosocial Psychosocial Symptoms Reported: No symptoms reported Behavioral Management Strategies: Coping strategies, Medication therapy, Counseling, Support system Behavioral Health Self-Management Outcome: 3 (uncertain) Major Change/Loss/Stressor/Fears (CP): Medical condition, self Techniques to Cope with Loss/Stress/Change: Counseling, Diversional activities, Medication, Support  group Quality of Family Relationships: helpful, involved, supportive Do you feel physically threatened by others?: No    06/10/2024    PHQ2-9 Depression Screening   Little interest or pleasure in doing things Not at all  Feeling down, depressed, or  hopeless    PHQ-2 - Total Score 0  Trouble falling or staying asleep, or sleeping too much    Feeling tired or having little energy    Poor appetite or overeating     Feeling bad about yourself - or that you are a failure or have let yourself or your family down    Trouble concentrating on things, such as reading the newspaper or watching television    Moving or speaking so slowly that other people could have noticed.  Or the opposite - being so fidgety or restless that you have been moving around a lot more than usual    Thoughts that you would be better off dead, or hurting yourself in some way    PHQ2-9 Total Score    If you checked off any problems, how difficult have these problems made it for you to do your work, take care of things at home, or get along with other people    Depression Interventions/Treatment      There were no vitals filed for this visit.  Medications Reviewed Today     Reviewed by Jorja Nichole LABOR, RN (Case Manager) on 06/10/24 at 1349  Med List Status: <None>   Medication Order Taking? Sig Documenting Provider Last Dose Status Informant  ARIPiprazole  (ABILIFY ) 5 MG tablet [id number] Yes Take 1 tablet (5 mg total) by mouth at bedtime.   Active   atorvastatin  (LIPITOR) 20 MG tablet [id number] Yes Take 1 tablet (20 mg total) by mouth daily. Newlin, Enobong, MD  Active   Blood Glucose Calibration (ACCU-CHEK ACTIVE GLUCOSE CONT VI) [id number]  by In Vitro route.  Patient not taking: Reported on 06/10/2024   [provider]  Active            Med Note ARBY, CARROLL   Tue Jan 06, 2023  2:55 PM) ONE NORMA is his current monitor.  Continuous Glucose Sensor (FREESTYLE LIBRE 3 PLUS SENSOR) MISC [id number] Yes Change  sensor every 15 days. Shamleffer, Donell Cardinal, MD  Active   Continuous Glucose Sensor (FREESTYLE LIBRE 3 SENSOR) [place] [id number] Yes Place 1 sensor on the skin every 14 days. Use to check glucose continuously Shamleffer, Ibtehal Jaralla, MD  Active   doxepin  (SINEQUAN ) 25 MG capsule [id number] Yes Take 25 mg by mouth at bedtime as needed. [provider]  Active   doxepin  (SINEQUAN ) 50 MG capsule [id number] Yes Take 1 capsule (50 mg total) by mouth as needed for sleep   Active   DULoxetine  (CYMBALTA ) 30 MG capsule [id number] Yes Take 30 mg by mouth daily. [provider]  Active   DULoxetine  (CYMBALTA ) 60 MG capsule [id number] Yes Take 1 capsule(s) by mouth at bedtime for anxiety/depression   Active   empagliflozin  (JARDIANCE ) 25 MG TABS tablet [id number] Yes Take 1 tablet (25 mg total) by mouth daily before breakfast. Shamleffer, Ibtehal Jaralla, MD  Active   gentamicin  cream (GARAMYCIN ) 0.1 % [id number]  Apply to wound left lower extremity once daily until healed.  Patient not taking: Reported on 06/10/2024   Gaynel Delon CROME, DPM  Active            Med Note ARBY, CARROLL   Tue Jan 06, 2023  2:56 PM) Not using at this time.  glipiZIDE  (GLUCOTROL ) 10 MG tablet [id number] Yes Take 1 tablet (10 mg total) by mouth 2 (two) times daily before a meal. Shamleffer, Donell Cardinal, MD  Active   glucose blood (ACCU-CHEK GUIDE) test strip [id number] Yes 1 each by  Other route 3 (three) times daily. Use as instructed Shamleffer, Ibtehal Jaralla, MD  Active            Med Note ARBY, CARROLL   Tue Jan 06, 2023  2:57 PM) One touch  mupirocin  ointment (BACTROBAN ) 2 % [id number]  Apply to right great toe once daily.  Patient not taking: Reported on 06/10/2024   Gaynel Delon CROME, DPM  Active   OneTouch Delica Lancets 33G MISC [id number] Yes 1 Device by Does not apply route 3 (three) times daily. Shamleffer, Ibtehal Jaralla, MD  Active   sildenafil  (VIAGRA ) 50 MG tablet [id number]  Take 1 tablet  (50 mg total) by mouth daily as needed for erectile dysfunction. At least 24 hours between doses  Patient not taking: Reported on 06/10/2024   Newlin, Enobong, MD  Active            Med Note [facility], CARROLL   Tue Jan 06, 2023  2:59 PM) Would like to have another Rx.  traMADol  (ULTRAM ) 50 MG tablet [id number] Yes Take 1 tablet (50 mg total) by mouth every 12 (twelve) hours as needed. Newlin, Enobong, MD  Active   warfarin (COUMADIN ) 2.5 MG tablet [id number] Yes TAKE 1 TABLET BY MOUTH DAILY AS DIRECTED BY THE COUMADIN  CLINIC. Newlin, Enobong, MD  Active   Med List Note Johnice Powell JULIANNA Bishop 02/03/12 2125): [facility] [address]            Recommendation:   PCP Follow-up Specialty provider follow-up : Endocrinology- 06/13/24; Podiatry- 07/12/24; Nutrition/DM- 07/29/24; [facility] Continue Current Plan of Care  Follow Up Plan:   Telephone follow-up in 1 month: 07/11/24 @ 3 pm  Nesha Counihan, RN, BSN, [employer] RN Care Manager [employer] [phone]

## 2024-06-13 ENCOUNTER — Other Ambulatory Visit: Payer: Self-pay

## 2024-06-13 ENCOUNTER — Encounter: Payer: Self-pay | Admitting: Internal Medicine

## 2024-06-13 ENCOUNTER — Other Ambulatory Visit: Payer: Self-pay | Admitting: Family Medicine

## 2024-06-13 ENCOUNTER — Other Ambulatory Visit (HOSPITAL_BASED_OUTPATIENT_CLINIC_OR_DEPARTMENT_OTHER): Payer: Self-pay

## 2024-06-13 ENCOUNTER — Ambulatory Visit: Admitting: Internal Medicine

## 2024-06-13 VITALS — BP 110/66 | HR 102 | Ht 72.0 in | Wt 309.0 lb

## 2024-06-13 DIAGNOSIS — Z794 Long term (current) use of insulin: Secondary | ICD-10-CM

## 2024-06-13 DIAGNOSIS — E1165 Type 2 diabetes mellitus with hyperglycemia: Secondary | ICD-10-CM | POA: Diagnosis not present

## 2024-06-13 DIAGNOSIS — E1142 Type 2 diabetes mellitus with diabetic polyneuropathy: Secondary | ICD-10-CM

## 2024-06-13 DIAGNOSIS — E1149 Type 2 diabetes mellitus with other diabetic neurological complication: Secondary | ICD-10-CM

## 2024-06-13 DIAGNOSIS — E113293 Type 2 diabetes mellitus with mild nonproliferative diabetic retinopathy without macular edema, bilateral: Secondary | ICD-10-CM | POA: Diagnosis not present

## 2024-06-13 MED ORDER — TRAMADOL HCL 50 MG PO TABS
50.0000 mg | ORAL_TABLET | Freq: Two times a day (BID) | ORAL | 4 refills | Status: DC | PRN
  Filled 2024-06-13: qty 60, 30d supply, fill #0
  Filled 2024-07-11 – 2024-07-12 (×2): qty 60, 30d supply, fill #1
  Filled 2024-08-09 (×2): qty 60, 30d supply, fill #2
  Filled 2024-09-05: qty 60, 30d supply, fill #3
  Filled 2024-10-04 (×2): qty 60, 30d supply, fill #4

## 2024-06-13 MED ORDER — EMPAGLIFLOZIN 25 MG PO TABS
25.0000 mg | ORAL_TABLET | Freq: Every day | ORAL | 3 refills | Status: AC
  Filled 2024-06-13 – 2024-08-05 (×2): qty 90, 90d supply, fill #0
  Filled 2024-11-01: qty 90, 90d supply, fill #1

## 2024-06-13 MED ORDER — GLIPIZIDE 10 MG PO TABS
20.0000 mg | ORAL_TABLET | Freq: Two times a day (BID) | ORAL | 3 refills | Status: AC
  Filled 2024-06-13 – 2024-07-06 (×2): qty 360, 90d supply, fill #0
  Filled 2024-10-04 (×2): qty 360, 90d supply, fill #1

## 2024-06-13 NOTE — Progress Notes (Signed)
 Name: Bradley [name]  Age/ Sex: 43 y.o., male   MRN/ DOB: [id number], Feb 25, 1981     PCP: Delbert Clam, MD   Reason for Endocrinology Evaluation: Type 2 Diabetes Mellitus  Initial Endocrine Consultative Visit: 10/18/2020    PATIENT IDENTIFIER: Bradley [name] is a 43 y.o. male with a past medical history of T2Dm, DVT and bipolar disorder. The patient has followed with Endocrinology clinic since 10/18/2020 for consultative assistance with management of his diabetes.  DIABETIC HISTORY:  Bradley [name] was diagnosed with DM yrs ago. Basaglar  made him feel sluggish. His hemoglobin A1c has ranged from 7.7% in 2019, peaking at 14.9% in 2021.   On his initial visit to our clinic his A1c was 13.6 %, he DECLINED insulin . He was on Glipizide  and Trulicity , we increased Glipizide , Trulicity  and added Pioglitazone . By the time he went  To see our RD in 12/2020 he was off pioglitazone   Due to abdominal cramps    Stopped Glipizide  and started basal insulin  12/2020  We stop Trulicity  by March 2023 as he was not taking it and he would forget to take it, we will started him on Jardiance  instead as his daily tablet to improve compliance  He self discontinued basal insulin  2024, and declined prandial insulin  as well as CeQur  Attempted to prescribe Rybelsus  but developed side effects 04/2023 and started glimepiride    He is disabled due to mental health   Switch glimepiride  to glipizide  May, 2025 with an A1c of 8.6%  SUBJECTIVE:   During the last visit (02/09/2024): A1c 8.6%   Today (06/13/2024): Bradley [name] is here for a follow up on diabetes management.  He is accompanied by [name].  He does not check his glucose and has no interest in doing so.  Patient follows with Dr. Valdemar for mild nonproliferative of both eyes He continues to follow-up with our CDE  No nausea or vomiting  No constipation or diarrhea  He is working on tobacco cessation, he is down to half a pack a day Pending  PFT per patient to his PCPs office  HOME DIABETES REGIMEN:  Jardiance  25 mg daily Glipizide  10 mg BID     Statin: no ACE-I/ARB: no Prior Diabetic Education: yes   CONTINUOUS GLUCOSE MONITORING RECORD INTERPRETATION    Dates of Recording: 9/2 - 06/13/2024  Sensor description: freestyle libre  Results statistics:   CGM use % of time 97  Average and SD 240/24.4  Time in range 15%  % Time Above 180 43  % Time above 250 42  % Time Below target 0   Glycemic patterns summary: Hyperglycemia noted throughout the day and night  Hyperglycemic episodes all day and night  Hypoglycemic episodes occurred N/A  Overnight periods: Mostly high     DIABETIC COMPLICATIONS: Microvascular complications:  Neuropathy, B/L DR  Denies: CKD retinopathy Last Eye Exam: Completed 04/2024  Macrovascular complications:   Denies: CAD, CVA, PVD   HISTORY:  Past Medical History:  Past Medical History:  Diagnosis Date   ADHD    ADHD (attention deficit hyperactivity disorder)    Bipolar 1 disorder (HCC)    Bipolar disorder (HCC)    Depression    Diabetes mellitus without complication (HCC)    Hyperlipidemia    Morbid obesity (HCC)    Obesity    Panic attack    Varicose veins of both lower extremities with pain    Past Surgical History:  Past Surgical History:  Procedure Laterality Date   surgery on  meatus as a child     Social History:  reports that he has been smoking cigarettes. He has a 15 pack-year smoking history. He has never used smokeless tobacco. He reports current alcohol  use of about 1.0 standard drink of alcohol  per week. He reports that he does not use drugs. Family History:  Family History  Problem Relation Age of Onset   Rheum arthritis Mother    Diabetes Mother    Heart failure Mother    Stroke Mother    ADD / ADHD Mother    Anxiety disorder Mother    Arthritis Mother    Depression Mother    Drug abuse Mother    Alcohol  abuse Father    Glaucoma Maternal  Grandmother    COPD Maternal Grandmother    Lung cancer Paternal Grandmother        smoker     HOME MEDICATIONS: Allergies as of 06/13/2024       Reactions   Actos  [pioglitazone ] Other (See Comments)   Stomach cramps   Gabapentin  Other (See Comments)   Crying spells   Lyrica  [pregabalin ] Other (See Comments)   Makes the patient somnolent        Medication List        Accurate as of June 13, 2024  3:25 PM. If you have any questions, ask your nurse or doctor.          ACCU-CHEK ACTIVE GLUCOSE CONT VI by In Vitro route.   Accu-Chek Guide test strip Generic drug: glucose blood 1 each by Other route 3 (three) times daily. Use as instructed   ARIPiprazole  5 MG tablet Commonly known as: ABILIFY  Take 1 tablet (5 mg total) by mouth at bedtime.   atorvastatin  20 MG tablet Commonly known as: LIPITOR Take 1 tablet (20 mg total) by mouth daily.   doxepin  50 MG capsule Commonly known as: SINEQUAN  Take 1 capsule (50 mg total) by mouth as needed for sleep   doxepin  25 MG capsule Commonly known as: SINEQUAN  Take 25 mg by mouth at bedtime as needed.   DULoxetine  30 MG capsule Commonly known as: CYMBALTA  Take 30 mg by mouth daily.   DULoxetine  60 MG capsule Commonly known as: CYMBALTA  Take 1 capsule(s) by mouth at bedtime for anxiety/depression   FreeStyle Libre 3 Sensor Misc Place 1 sensor on the skin every 14 days. Use to check glucose continuously   FreeStyle Libre 3 Plus Sensor Misc Change sensor every 15 days.   gentamicin  cream 0.1 % Commonly known as: GARAMYCIN  Apply to wound left lower extremity once daily until healed.   glipiZIDE  10 MG tablet Commonly known as: GLUCOTROL  Take 2 tablets (20 mg total) by mouth 2 (two) times daily before a meal. What changed: how much to take Changed by: Donell PARAS Kennedie Pardoe   Jardiance  25 MG Tabs tablet Generic drug: empagliflozin  Take 1 tablet (25 mg total) by mouth daily before breakfast.   mupirocin   ointment 2 % Commonly known as: BACTROBAN  Apply to right great toe once daily.   OneTouch Delica Lancets 33G Misc 1 Device by Does not apply route 3 (three) times daily.   sildenafil  50 MG tablet Commonly known as: Viagra  Take 1 tablet (50 mg total) by mouth daily as needed for erectile dysfunction. At least 24 hours between doses   traMADol  50 MG tablet Commonly known as: ULTRAM  Take 1 tablet (50 mg total) by mouth every 12 (twelve) hours as needed.   warfarin 2.5 MG tablet Commonly known as: COUMADIN   Take as directed by the anticoagulation clinic. If you are unsure how to take this medication, talk to your nurse or doctor. Original instructions: TAKE 1 TABLET BY MOUTH DAILY AS DIRECTED BY THE COUMADIN  CLINIC.         OBJECTIVE:   Vital Signs: BP 110/66 (BP Location: Left Arm, Patient Position: Sitting, Cuff Size: Normal)   Pulse (!) 102   Ht 6' (1.829 m)   Wt (!) 309 lb (140.2 kg)   SpO2 96%   BMI 41.91 kg/m   Wt Readings from Last 3 Encounters:  06/13/24 (!) 309 lb (140.2 kg)  02/16/24 (!) 311 lb 12.8 oz (141.4 kg)  02/09/24 (!) 308 lb (139.7 kg)     Exam: General: Pt appears well and is in NAD  Lungs: Scattered wheeze  Heart: RRR   Extremities: Trace pretibial edema.  Stasis dermatitis over the shins  Neuro: MS is good with appropriate affect, pt is alert and Ox3     DM foot exam: 09/15/2023 per podiatry      DATA REVIEWED:  Lab Results  Component Value Date   HGBA1C 9.1 (A) 05/20/2024   HGBA1C 8.6 (A) 02/08/2024   HGBA1C 8.6 (A) 11/09/2023    Latest Reference Range & Units 02/09/24 14:47  Sodium 134 - 144 mmol/L 140  Potassium 3.5 - 5.2 mmol/L 5.0  Chloride 96 - 106 mmol/L 100  CO2 20 - 29 mmol/L 21  Glucose 70 - 99 mg/dL 815 (H)  BUN 6 - 24 mg/dL 15  Creatinine 9.23 - 8.72 mg/dL 8.91  Calcium  8.7 - 10.2 mg/dL 9.3  BUN/Creatinine Ratio 9 - 20  14  eGFR >59 mL/min/1.73 87  Alkaline Phosphatase 44 - 121 IU/L 85  Albumin 4.1 - 5.1 g/dL 4.6   AST 0 - 40 IU/L 24  ALT 0 - 44 IU/L 30  Total Protein 6.0 - 8.5 g/dL 7.1  Total Bilirubin 0.0 - 1.2 mg/dL 0.8  Cholesterol, Total 100 - 199 mg/dL 873  HDL Cholesterol >60 mg/dL 46  Total Non-HDL-Chol (LDL+VLDL) 0 - 129 mg/dL 80  Triglycerides 0 - 850 mg/dL 870  VLDL Cholesterol Cal 5 - 40 mg/dL 23  LDL Chol Calc (NIH) 0 - 99 mg/dL 57  (H): Data is abnormally high   ASSESSMENT / PLAN / RECOMMENDATIONS:   1) Type 2 Diabetes Mellitus, Poorly controlled, With Neuropathic and retinopathic complications - Most recent A1c of 9.1%. Goal A1c < 7.0 %.   -Patient continues to poorly controlled DM -Intolerant to Trulicity  and Rybelsus  -Declines insulin  - I switched him from glimepiride  to glipizide  due to persistent hyperglycemia, will increase glipizide  as below - We discussed the importance of optimizing glucose control to prevent worsening and progressive diabetic retinopathy  MEDICATIONS:  Increase glipizide  10 mg, 2 tablets before breakfast and 2 tablets before supper Continue  Jardiance  25 mg daily   EDUCATION / INSTRUCTIONS: BG monitoring instructions: Patient is instructed to check his blood sugars 3 times a day, before meals  Call [facility] if: BG persistently < 70  I reviewed the Rule of 15 for the treatment of hypoglycemia in detail with the patient. Literature supplied.    2) Diabetic complications:  Eye: Does not have known diabetic retinopathy.  Neuro/ Feet: Does  have known diabetic peripheral neuropathy  Renal: Patient does not have known baseline CKD. He   is not on an ACEI/ARB at present.   3) Hypertriglyceridemia:  -Historically he has had elevated triglycerides up to 367 mg/DL in  2021, over the years this has trended down to normal levels -Patient on atorvastatin  20 mg daily   F/U in 4 months   Signed electronically by: Stefano Redgie Butts, MD  [facility] Endocrinology  [facility] [address] [place], [place] [address] Phone: [phone] FAX: [phone]   CC: Delbert Clam, MD [address] [place] [address] Phone: [phone]  Fax: [phone]  Return to Endocrinology clinic as below: Future Appointments  Date Time Provider Department Center  06/30/2024  3:00 PM Fleeta Tonia Garnette LITTIE, RPH-CPP [facility] [address]  07/11/2024  3:00 PM Jorja Nichole LABOR, RN [facility] None  07/12/2024  3:15 PM Gaynel Delon LITTIE, DPM [facility]  07/29/2024  1:30 PM Knox Leita LITTIE, RD [facility]  08/08/2024  2:30 PM Delbert Clam, MD [facility] [address]  02/21/2025  2:10 PM [facility] [place] VISIT [facility]  05/16/2025 12:45 PM Valdemar Rogue, MD [facility] None

## 2024-06-14 ENCOUNTER — Other Ambulatory Visit: Payer: Self-pay

## 2024-06-14 ENCOUNTER — Encounter: Payer: Self-pay | Admitting: Internal Medicine

## 2024-06-14 DIAGNOSIS — F411 Generalized anxiety disorder: Secondary | ICD-10-CM | POA: Diagnosis not present

## 2024-06-14 DIAGNOSIS — F3132 Bipolar disorder, current episode depressed, moderate: Secondary | ICD-10-CM | POA: Diagnosis not present

## 2024-06-14 DIAGNOSIS — F4312 Post-traumatic stress disorder, chronic: Secondary | ICD-10-CM | POA: Diagnosis not present

## 2024-06-21 ENCOUNTER — Other Ambulatory Visit: Payer: Self-pay

## 2024-06-22 ENCOUNTER — Other Ambulatory Visit: Payer: Self-pay

## 2024-06-23 ENCOUNTER — Encounter (INDEPENDENT_AMBULATORY_CARE_PROVIDER_SITE_OTHER): Payer: Self-pay

## 2024-06-29 ENCOUNTER — Telehealth: Payer: Self-pay | Admitting: Family Medicine

## 2024-06-29 NOTE — Telephone Encounter (Signed)
 Confirmed appt for 10/2

## 2024-06-30 ENCOUNTER — Ambulatory Visit: Attending: Family Medicine | Admitting: Pharmacist

## 2024-06-30 DIAGNOSIS — I825Y2 Chronic embolism and thrombosis of unspecified deep veins of left proximal lower extremity: Secondary | ICD-10-CM | POA: Diagnosis not present

## 2024-06-30 LAB — POCT INR: POC INR: 2.7

## 2024-07-05 ENCOUNTER — Ambulatory Visit
Admission: RE | Admit: 2024-07-05 | Discharge: 2024-07-05 | Disposition: A | Discharge status: Home / Self Care | Source: Ambulatory Visit

## 2024-07-05 VITALS — BP 91/64 | HR 103 | Temp 97.8°F | Resp 18 | Ht 72.0 in | Wt 309.0 lb

## 2024-07-05 DIAGNOSIS — L03116 Cellulitis of left lower limb: Secondary | ICD-10-CM

## 2024-07-05 MED ORDER — DOXYCYCLINE HYCLATE 100 MG PO CAPS: 100.0000 mg | ORAL_CAPSULE | Freq: Two times a day (BID) | ORAL | 0 refills | Status: AC

## 2024-07-05 MED ORDER — BACITRACIN ZINC 500 UNIT/GM EX OINT: TOPICAL_OINTMENT | Freq: Once | CUTANEOUS | Status: AC

## 2024-07-05 NOTE — Discharge Instructions (Signed)
  1. Cellulitis of left lower extremity (Primary) - Apply dressing over left lower extremity wound - bacitracin ointment applied to wound for infection prevention. - doxycycline  (VIBRAMYCIN ) 100 MG capsule; Take 1 capsule (100 mg total) by mouth 2 (two) times daily for 7 days.  Dispense: 14 capsule; Refill: 0 -Continue to monitor symptoms for any change in severity if there is any escalation of current symptoms or development of new symptoms follow-up in ER for further evaluation and management.

## 2024-07-05 NOTE — ED Triage Notes (Signed)
 Patient presents for wound check and toe injury. Reports falling two weeks ago while entering the house, catching foot and sustaining a gash on the left lower leg with some discharge. Also injured left great toe during the fall, now concerned it may be infected. No fever reported. Patient contacted provider and was advised to be seen at Urgent Care. Notes that the dark discoloration of the left leg is baseline due to neuropathy.

## 2024-07-05 NOTE — ED Provider Notes (Signed)
 [facility]  Note:  This document was prepared using Conservation officer, historic buildings and may include unintentional dictation errors.  MRN: [id number] DOB: 22-Feb-1981  Subjective:   Bradley [name] is a 43 y.o. male presenting for evaluation of wound to left lower leg and to left great toe after injuring his leg 2 weeks ago.  Patient reports that he tripped going into his house falling forward hitting his shin and scraping his left great toe.  Patient states since that time he has had increased redness, swelling, pain to the area.  Denies any significant warmth, no fever, patient has history of diabetes type 2 and was concern for possible secondary infection.  No current facility-administered medications for this encounter.  Current Outpatient Medications:    doxycycline  (VIBRAMYCIN ) 100 MG capsule, Take 1 capsule (100 mg total) by mouth 2 (two) times daily for 7 days., Disp: 14 capsule, Rfl: 0   ARIPiprazole  (ABILIFY ) 5 MG tablet, Take 1 tablet (5 mg total) by mouth at bedtime., Disp: 30 tablet, Rfl: 2   atorvastatin  (LIPITOR) 20 MG tablet, Take 1 tablet (20 mg total) by mouth daily., Disp: 90 tablet, Rfl: 3   Blood Glucose Calibration (ACCU-CHEK ACTIVE GLUCOSE CONT VI), by In Vitro route. (Patient not taking: Reported on 05/17/2024), Disp: , Rfl:    Continuous Glucose Sensor (FREESTYLE LIBRE 3 PLUS SENSOR) MISC, Change sensor every 15 days., Disp: 6 each, Rfl: 3   Continuous Glucose Sensor (FREESTYLE LIBRE 3 SENSOR) MISC, Place 1 sensor on the skin every 14 days. Use to check glucose continuously, Disp: 6 each, Rfl: 3   doxepin  (SINEQUAN ) 25 MG capsule, Take 25 mg by mouth at bedtime as needed., Disp: , Rfl:    doxepin  (SINEQUAN ) 50 MG capsule, Take 1 capsule (50 mg total) by mouth as needed for sleep, Disp: 30 capsule, Rfl: 2   DULoxetine  (CYMBALTA ) 30 MG capsule, Take 30 mg by mouth daily., Disp: , Rfl:    DULoxetine  (CYMBALTA ) 60 MG capsule, Take 1 capsule(s) by mouth at  bedtime for anxiety/depression, Disp: 30 capsule, Rfl: 2   empagliflozin  (JARDIANCE ) 25 MG TABS tablet, Take 1 tablet (25 mg total) by mouth daily before breakfast., Disp: 90 tablet, Rfl: 3   gentamicin  cream (GARAMYCIN ) 0.1 %, Apply to wound left lower extremity once daily until healed. (Patient not taking: Reported on 05/17/2024), Disp: 30 g, Rfl: 1   glipiZIDE  (GLUCOTROL ) 10 MG tablet, Take 2 tablets (20 mg total) by mouth 2 (two) times daily before a meal., Disp: 360 tablet, Rfl: 3   glucose blood (ACCU-CHEK GUIDE) test strip, 1 each by Other route 3 (three) times daily. Use as instructed, Disp: 300 each, Rfl: 3   mupirocin  ointment (BACTROBAN ) 2 %, Apply to right great toe once daily. (Patient not taking: Reported on 05/17/2024), Disp: 30 g, Rfl: 1   OneTouch Delica Lancets 33G MISC, 1 Device by Does not apply route 3 (three) times daily., Disp: 300 each, Rfl: 3   sildenafil  (VIAGRA ) 50 MG tablet, Take 1 tablet (50 mg total) by mouth daily as needed for erectile dysfunction. At least 24 hours between doses (Patient not taking: Reported on 05/17/2024), Disp: 10 tablet, Rfl: 1   traMADol  (ULTRAM ) 50 MG tablet, Take 1 tablet (50 mg total) by mouth every 12 (twelve) hours as needed., Disp: 60 tablet, Rfl: 4   warfarin (COUMADIN ) 2.5 MG tablet, TAKE 1 TABLET BY MOUTH DAILY AS DIRECTED BY THE COUMADIN  CLINIC., Disp: 90 tablet, Rfl: 1   Allergies  Allergen Reactions   Actos  [Pioglitazone ] Other (See Comments)    Stomach cramps   Gabapentin  Other (See Comments)    Crying spells   Lyrica  [Pregabalin ] Other (See Comments)    Makes the patient somnolent    Past Medical History:  Diagnosis Date   ADHD    ADHD (attention deficit hyperactivity disorder)    Bipolar 1 disorder (HCC)    Bipolar disorder (HCC)    Depression    Diabetes mellitus without complication (HCC)    Hyperlipidemia    Morbid obesity (HCC)    Obesity    Panic attack    Varicose veins of both lower extremities with pain       Past Surgical History:  Procedure Laterality Date   surgery on meatus as a child      Family History  Problem Relation Age of Onset   Rheum arthritis Mother    Diabetes Mother    Heart failure Mother    Stroke Mother    ADD / ADHD Mother    Anxiety disorder Mother    Arthritis Mother    Depression Mother    Drug abuse Mother    Alcohol  abuse Father    Glaucoma Maternal Grandmother    COPD Maternal Grandmother    Lung cancer Paternal Grandmother        smoker    Social History   Tobacco Use   Smoking status: Every Day    Current packs/day: 1.00    Average packs/day: 1 pack/day for 15.0 years (15.0 ttl pk-yrs)    Types: Cigarettes   Smokeless tobacco: Never  Vaping Use   Vaping status: Never Used  Substance Use Topics   Alcohol  use: Yes    Alcohol /week: 1.0 standard drink of alcohol     Types: 1 Shots of liquor per week    Comment: Only on my birthday and New Year's   Drug use: No    ROS Refer to HPI for ROS details.  Objective:   Vitals: BP 91/64 (BP Location: Left Arm)   Pulse (!) 103   Temp 97.8 F (36.6 C) (Oral)   Resp 18   Ht 6' (1.829 m)   Wt (!) 309 lb (140.2 kg)   SpO2 94%   BMI 41.91 kg/m   Physical Exam Vitals and nursing note reviewed.  Constitutional:      General: He is not in acute distress.    Appearance: Normal appearance. He is not ill-appearing or toxic-appearing.  HENT:     Head: Normocephalic.  Cardiovascular:     Rate and Rhythm: Normal rate.  Pulmonary:     Effort: Pulmonary effort is normal. No respiratory distress.  Skin:    General: Skin is warm and dry.     Capillary Refill: Capillary refill takes less than 2 seconds.     Findings: Bruising, erythema and wound present.      Neurological:     General: No focal deficit present.     Mental Status: He is alert and oriented to person, place, and time.  Psychiatric:        Mood and Affect: Mood normal.        Behavior: Behavior normal.     Procedures  No  results found for this or any previous visit (from the past 24 hours).  No results found.   Assessment and Plan :     Discharge Instructions       1. Cellulitis of left lower extremity (Primary) - Apply dressing over  left lower extremity wound - bacitracin ointment applied to wound for infection prevention. - doxycycline  (VIBRAMYCIN ) 100 MG capsule; Take 1 capsule (100 mg total) by mouth 2 (two) times daily for 7 days.  Dispense: 14 capsule; Refill: 0 -Continue to monitor symptoms for any change in severity if there is any escalation of current symptoms or development of new symptoms follow-up in ER for further evaluation and management.       Uchechi Denison B Charish Schroepfer   Gabrial Domine, [place] B, [place] 07/05/24 1507

## 2024-07-06 ENCOUNTER — Other Ambulatory Visit: Payer: Self-pay

## 2024-07-07 ENCOUNTER — Other Ambulatory Visit: Payer: Self-pay

## 2024-07-11 ENCOUNTER — Other Ambulatory Visit: Payer: Self-pay

## 2024-07-11 ENCOUNTER — Other Ambulatory Visit: Payer: Self-pay | Admitting: *Deleted

## 2024-07-11 NOTE — Patient Instructions (Signed)
 Visit Information  Thank you for taking time to visit with me today. Please don't hesitate to contact me if I can be of assistance to you before our next scheduled appointment.  Your next care management appointment is by telephone on 08/15/24 at 1:30 pm  Please call the care guide team at [phone] if you need to cancel, schedule, or reschedule an appointment.   Please call the Suicide and Crisis Lifeline: 988 call the [place]  National Suicide Prevention Lifeline: (469)512-6916 or TTY: 5805872444 TTY 581-488-0413) to talk to a trained counselor call 1-800-273-TALK (toll free, 24 hour hotline) go to [facility] [address], [place] [phone]) call 911 if you are experiencing a Mental Health or Behavioral Health Crisis or need someone to talk to.  Mace Weinberg, RN, BSN, Theatre manager [employer] [phone]

## 2024-07-11 NOTE — Patient Outreach (Signed)
 Complex Care Management   Visit Note  07/11/2024  Name:  Bradley [name] MRN: [id number] DOB: 09/07/1981  Situation: Referral received for Complex Care Management related to Diabetes I obtained verbal consent from Patient.  Visit completed with Patient  on the phone  Background:   Past Medical History:  Diagnosis Date   ADHD    ADHD (attention deficit hyperactivity disorder)    Bipolar 1 disorder (HCC)    Bipolar disorder (HCC)    Depression    Diabetes mellitus without complication (HCC)    Hyperlipidemia    Morbid obesity (HCC)    Obesity    Panic attack    Varicose veins of both lower extremities with pain     Assessment: Patient Reported Symptoms:  Cognitive Cognitive Status: No symptoms reported, Able to follow simple commands, Alert and oriented to person, place, and time, Insightful and able to interpret abstract concepts, Normal speech and language skills Cognitive/Intellectual Conditions Management [RPT]: None reported or documented in medical history or problem list   Health Maintenance Behaviors: Annual physical exam, Sleep adequate, Healthy diet, Stress management Healing Pattern: Average Health Facilitated by: Stress management, Pain control, Rest  Neurological Neurological Review of Symptoms: No symptoms reported Neurological Management Strategies: Adequate rest Neurological Self-Management Outcome: 4 (good)  HEENT HEENT Symptoms Reported: No symptoms reported HEENT Management Strategies: Routine screening, Adequate rest HEENT Self-Management Outcome: 4 (good)    Cardiovascular Cardiovascular Symptoms Reported: No symptoms reported Does patient have uncontrolled Hypertension?: No Cardiovascular Management Strategies: Adequate rest, Routine screening Cardiovascular Self-Management Outcome: 4 (good)  Respiratory Respiratory Symptoms Reported: No symptoms reported Respiratory Management Strategies: Routine screening Respiratory Self-Management Outcome: 4  (good)  Endocrine Is patient diabetic?: Yes Is patient checking blood sugars at home?: Yes List most recent blood sugar readings, include date and time of day: Patient has [employer].  Patient reports that his BS is 233 fasting.  he reports that he consumes occasional sweets.  Discuss eating small portions of sweets to prevent large spikes in blood sugar.  Also discussed counting carbohydrates with each meal. Endocrine Self-Management Outcome: 3 (uncertain) Endocrine Comment: Patient reports that highest blood sugar this week was 350 and lowest blood sugar has been 110.  Gastrointestinal Gastrointestinal Symptoms Reported: No symptoms reported Gastrointestinal Management Strategies: Adequate rest Gastrointestinal Self-Management Outcome: 4 (good)    Genitourinary Genitourinary Symptoms Reported: Other Other Genitourinary Symptoms: Reports recent treatment of cellulitis in left leg.  Patient reports that he was placed on doxcycline and leg feeling better at this time. Genitourinary Management Strategies: Adequate rest Genitourinary Self-Management Outcome: 3 (uncertain)  Integumentary Integumentary Symptoms Reported: Other Other Integumentary Symptoms: Redness to left left due to cellulitis infection Skin Management Strategies: Adequate rest, Medication therapy, Routine screening Skin Self-Management Outcome: 3 (uncertain)  Musculoskeletal Musculoskelatal Symptoms Reviewed: Back pain Additional Musculoskeletal Details: Reports occassional back pain and leg pain.  Patient reports that he takes Ultram  and Tylenol  for back pain. Patient reports pain level a 4/10 on pain scale. Musculoskeletal Management Strategies: Medication therapy, Adequate rest, Routine screening, Weight management Musculoskeletal Self-Management Outcome: 3 (uncertain) Falls in the past year?: No Number of falls in past year: 1 or less Was there an injury with Fall?: No Fall Risk Category Calculator: 0 Patient Fall  Risk Level: Low Fall Risk Patient at Risk for Falls Due to: No Fall Risks Fall risk Follow up: Falls evaluation completed  Psychosocial Psychosocial Symptoms Reported: No symptoms reported Behavioral Management Strategies: Support system, Medication therapy Behavioral Health Self-Management Outcome: 4 (good)  Major Change/Loss/Stressor/Fears (CP): Denies Techniques to Cope with Loss/Stress/Change: Diversional activities, Counseling, Meditation, Support group Quality of Family Relationships: involved, helpful, supportive Do you feel physically threatened by others?: No    07/11/2024    PHQ2-9 Depression Screening   Little interest or pleasure in doing things Not at all  Feeling down, depressed, or hopeless Not at all  PHQ-2 - Total Score 0  Trouble falling or staying asleep, or sleeping too much    Feeling tired or having little energy    Poor appetite or overeating     Feeling bad about yourself - or that you are a failure or have let yourself or your family down    Trouble concentrating on things, such as reading the newspaper or watching television    Moving or speaking so slowly that other people could have noticed.  Or the opposite - being so fidgety or restless that you have been moving around a lot more than usual    Thoughts that you would be better off dead, or hurting yourself in some way    PHQ2-9 Total Score    If you checked off any problems, how difficult have these problems made it for you to do your work, take care of things at home, or get along with other people    Depression Interventions/Treatment      There were no vitals filed for this visit.  Medications Reviewed Today     Reviewed by Jorja Nichole LABOR, RN (Case Manager) on 07/11/24 at 1149  Med List Status: <None>   Medication Order Taking? Sig Documenting Provider Last Dose Status Informant  ARIPiprazole  (ABILIFY ) 5 MG tablet [id number] Yes Take 1 tablet (5 mg total) by mouth at bedtime.   Active    atorvastatin  (LIPITOR) 20 MG tablet [id number] Yes Take 1 tablet (20 mg total) by mouth daily. Newlin, Enobong, MD  Active   Blood Glucose Calibration (ACCU-CHEK ACTIVE GLUCOSE CONT VI) [id number]  by In Vitro route.  Patient not taking: Reported on 07/11/2024   [provider]  Active            Med Note [facility], CARROLL   Tue Jan 06, 2023  2:55 PM) ONE NORMA is his current monitor.  Continuous Glucose Sensor (FREESTYLE LIBRE 3 PLUS SENSOR) MISC [id number] Yes Change sensor every 15 days. Shamleffer, Donell Cardinal, MD  Active   Continuous Glucose Sensor (FREESTYLE LIBRE 3 SENSOR) [place] [id number] Yes Place 1 sensor on the skin every 14 days. Use to check glucose continuously Shamleffer, Ibtehal Jaralla, MD  Active   doxepin  (SINEQUAN ) 25 MG capsule [id number] Yes Take 25 mg by mouth at bedtime as needed. [provider]  Active   doxepin  (SINEQUAN ) 50 MG capsule [id number] Yes Take 1 capsule (50 mg total) by mouth as needed for sleep   Active   doxycycline  (VIBRAMYCIN ) 100 MG capsule [id number] Yes Take 1 capsule (100 mg total) by mouth 2 (two) times daily for 7 days. Reddick, Johnathan B, NP  Active   DULoxetine  (CYMBALTA ) 30 MG capsule [id number] Yes Take 30 mg by mouth daily. [provider]  Active   DULoxetine  (CYMBALTA ) 60 MG capsule [id number] Yes Take 1 capsule(s) by mouth at bedtime for anxiety/depression   Active   empagliflozin  (JARDIANCE ) 25 MG TABS tablet [id number] Yes Take 1 tablet (25 mg total) by mouth daily before breakfast. Shamleffer, Ibtehal Jaralla, MD  Active   gentamicin  cream (GARAMYCIN ) 0.1 % [id number]  Apply to wound left lower extremity once daily  until healed.  Patient not taking: Reported on 07/11/2024   Gaynel Delon CROME, DPM  Active            Med Note [facility], CARROLL   Tue Jan 06, 2023  2:56 PM) Not using at this time.  glipiZIDE  (GLUCOTROL ) 10 MG tablet [id number] Yes Take 2 tablets (20 mg total) by mouth 2 (two) times daily before a meal.  Shamleffer, Donell Cardinal, MD  Active   glucose blood (ACCU-CHEK GUIDE) test strip [id number]  1 each by Other route 3 (three) times daily. Use as instructed  Patient not taking: Reported on 07/11/2024   Shamleffer, Ibtehal Jaralla, MD  Active            Med Note [facility], CARROLL   Tue Jan 06, 2023  2:57 PM) One touch  mupirocin  ointment (BACTROBAN ) 2 % [id number]  Apply to right great toe once daily.  Patient not taking: Reported on 07/11/2024   Gaynel Delon CROME, DPM  Active   OneTouch Delica Lancets 33G MISC [id number]  1 Device by Does not apply route 3 (three) times daily. Shamleffer, Ibtehal Jaralla, MD  Active   sildenafil  (VIAGRA ) 50 MG tablet [id number]  Take 1 tablet (50 mg total) by mouth daily as needed for erectile dysfunction. At least 24 hours between doses  Patient not taking: Reported on 07/11/2024   Newlin, Enobong, MD  Active            Med Note [facility], CARROLL   Tue Jan 06, 2023  2:59 PM) Would like to have another Rx.  traMADol  (ULTRAM ) 50 MG tablet [id number] Yes Take 1 tablet (50 mg total) by mouth every 12 (twelve) hours as needed. Newlin, Enobong, MD  Active   warfarin (COUMADIN ) 2.5 MG tablet [id number] Yes TAKE 1 TABLET BY MOUTH DAILY AS DIRECTED BY THE COUMADIN  CLINIC. Newlin, Enobong, MD  Active   Med List Note Johnice Powell JULIANNA Bishop 02/03/12 2125): [facility] [address]            Recommendation:   PCP Follow-up Continue Current Plan of Care  Follow Up Plan:   Telephone follow-up in 1 month:08/15/24 @ 1:30 pm  Seleena Reimers, RN, Scientist, research (physical sciences), [employer] RN Care Manager [employer] [phone]

## 2024-07-12 ENCOUNTER — Other Ambulatory Visit: Payer: Self-pay

## 2024-07-12 ENCOUNTER — Ambulatory Visit: Admitting: Podiatry

## 2024-07-12 ENCOUNTER — Encounter: Payer: Self-pay | Admitting: Podiatry

## 2024-07-12 DIAGNOSIS — B351 Tinea unguium: Secondary | ICD-10-CM

## 2024-07-12 DIAGNOSIS — M79674 Pain in right toe(s): Secondary | ICD-10-CM | POA: Diagnosis not present

## 2024-07-12 DIAGNOSIS — M79675 Pain in left toe(s): Secondary | ICD-10-CM

## 2024-07-12 DIAGNOSIS — E1142 Type 2 diabetes mellitus with diabetic polyneuropathy: Secondary | ICD-10-CM | POA: Diagnosis not present

## 2024-07-17 NOTE — Progress Notes (Signed)
 Subjective:  Patient ID: Bradley [name], male    DOB: 12/03/1980,  MRN: [id number]  Bradley [name] presents to clinic today for preventative diabetic foot care and painful thick toenails that are difficult to trim. Pain interferes with ambulation. Aggravating factors include wearing enclosed shoe gear. Pain is relieved with periodic professional debridement. He relates going urgent care after a fall. He was concerned about infection of left great toe. He was prescribed a 7 day course of doxycycline  for cellulitis of left leg. He took last antibiotic last night. Patient states toe is still sore and would like to have it looked at again. 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 Chief Complaint  Patient presents with   Diabetes    I'm here for a six month follow-up.  I'd like her to look at my left big toe.  I whacked it really good when I fell.  I went to Urgent Care they said it was just an infection but it is still kind of reddish.  Saw Dr. Sam - 06/13/2024; A1c - 8.7          PCP is Newlin, Enobong, MD.  Allergies  Allergen Reactions   Actos  [Pioglitazone ] Other (See Comments)    Stomach cramps   Gabapentin  Other (See Comments)    Crying spells   Lyrica  [Pregabalin ] Other (See Comments)    Makes the patient somnolent    Review of Systems: Negative except as noted in the HPI.  Objective:  There were no vitals filed for this visit. Bradley [name] is a pleasant 43 y.o. male morbidly obese in NAD. AAO x 3.  Vascular Examination: Capillary refill time immediate b/l. Vascular status intact b/l with palpable pedal pulses. Pedal hair absent b/l. No pain with calf compression b/l. Skin temperature gradient WNL b/l. No cyanosis or clubbing b/l. No ischemia or gangrene noted b/l. Trace edema noted BLE. Varicosities present b/l. Evidence of skin changes consistent with long term venous stasis BLE.  Neurological Examination: Sensation  grossly intact b/l with 10 gram monofilament. Vibratory sensation intact b/l. Pt has subjective symptoms of neuropathy.  Dermatological Examination: Left hallux with mild edema and erythema at base of left hallux nail plate. No purulence, no fluctuance.  Pedal skin with normal turgor, texture and tone b/l.  No open wounds. No interdigital macerations.   Toenails 1-5 b/l thick, discolored, elongated with subungual debris and pain on dorsal palpation. .  Evidence of poor pedal hygiene on plantar aspect of both feet.  Musculoskeletal Examination: Muscle strength 5/5 to all lower extremity muscle groups bilaterally. No pain, crepitus or joint limitation noted with ROM bilateral LE. No gross bony deformities bilaterally.  Radiographs: None  Assessment/Plan: 1. Pain due to onychomycosis of toenails of both feet   2. Diabetic peripheral neuropathy associated with type 2 diabetes mellitus (HCC)   Patient was evaluated and treated. All patient's and/or POA's questions/concerns addressed on today's visit. He continues to be noncompliant in regards to walking barefoot. Informed about the dangers of walking barefoot. Discussed pedal hygiene. Toenails 1-5 b/l debrided in length and girth without incident. Continue foot and shoe inspections daily. Monitor blood glucose per PCP/Endocrinologist's recommendations. Continue soft, supportive shoe gear daily. Report any pedal injuries to medical professional. Call office if there are any questions/concerns. Patient with h/o fall and completed 7 day course of doxycycline . Will schedule him for Monday for re-evaluation for trauma to left foot.  Return in about 3 months (around 10/12/2024).  Bradley [name], DPM      [place] LOCATION: [address] [place] [address]                   Office [phone]   [facility] LOCATION: [address] [place], [place] [address] Office [phone]

## 2024-07-18 ENCOUNTER — Ambulatory Visit (INDEPENDENT_AMBULATORY_CARE_PROVIDER_SITE_OTHER)

## 2024-07-18 ENCOUNTER — Ambulatory Visit: Admitting: Podiatry

## 2024-07-18 ENCOUNTER — Encounter: Payer: Self-pay | Admitting: Podiatry

## 2024-07-18 DIAGNOSIS — S90112A Contusion of left great toe without damage to nail, initial encounter: Secondary | ICD-10-CM | POA: Diagnosis not present

## 2024-07-18 DIAGNOSIS — R609 Edema, unspecified: Secondary | ICD-10-CM | POA: Diagnosis not present

## 2024-07-20 NOTE — Progress Notes (Signed)
 Subjective:   Patient ID: Bradley [name], male   DOB: 43 y.o.   MRN: [id number]   HPI Patient presents stating that he hit his left great toe and it has been swollen for a week he has finished antibiotics for his leg which seems okay   ROS      Objective:  Physical Exam  Neurovascular status intact with patient who traumatized his left big toe and has bruising and swelling localized no proximal edema erythema no indications that has anything to do with his leg that was previously treated     Assessment:  Probability for contusion left hallux or other pathology     Plan:  8 MP x-ray taken reviewed advised on soaks therapy and that this should gradually clear up will probably take several months.  Patient will be seen back to recheck as symptoms indicate or for regular care  X-rays were negative for signs of fracture of the hallux or bony contusions

## 2024-07-29 ENCOUNTER — Encounter: Attending: Internal Medicine | Admitting: Dietician

## 2024-07-29 VITALS — Wt 322.0 lb

## 2024-07-29 DIAGNOSIS — E1165 Type 2 diabetes mellitus with hyperglycemia: Secondary | ICD-10-CM | POA: Diagnosis not present

## 2024-07-29 DIAGNOSIS — Z794 Long term (current) use of insulin: Secondary | ICD-10-CM | POA: Insufficient documentation

## 2024-07-29 NOTE — Patient Instructions (Addendum)
 Goal:  Go back to the gym or other exercise 2-3 times per week Drink zero sugar beverages. Take your medication as prescribed.  Consider Exercise Videos to do in your home such as Sonny Carota walking videos - 30 minutes most days. Mindfulness:             Consistently scheduled meal - avoid skipping             Choices             Eat slowly             Away from distraction (sitting in kitchen or dining room)             Stop eating when satisfied             Before a snack ask, Am I hungry or eating for another reason?                         What can I do instead if I am not hungry?   Look at your sensor reading before and after you eat.  What is the affect of food on blood glucose?   Diet quality Move to action Drink more water!

## 2024-07-29 NOTE — Progress Notes (Signed)
 Diabetes Self-Management Education  Visit Type: Follow-up  Appt. Start Time: 1340 Appt. End Time: 1420  08/07/2024  [name], identified by name and date of birth, is a 43 y.o. male with a diagnosis of Diabetes:  .   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 He states that he had cellulitis in his leg (after a fall 5 weeks ago) and received antibiotics for this 2 weeks ago. He states that he has slow bleeds in the back of his eyes. Patient is here today with his girlfriend.  He was last seen by myself 05/13/2024.    He is now part of the research study at [facility] and they are providing patient with the [employer] 3. He hasn't been going to the gym chaotic life but verbalizes a plan to return in the next 2 weeks. He does not feel safe to walk in his neighborhood. Today worked on overcoming barriers to change. Discussed nutrition and importance of diet quality.     He completed the PREP class [employer] program which meets Monday and Wednesday for 3 months. Since finishing he is walking the dog most days and going to gym on Friday's. Decreased smoking to 1/2 pack but has increased vaping.  He cannot find a lower nicotine  vape and does not like the zero nicotine  vape. States he has gained weight due to medications but also food choices. Likes little vegetables and does not wish to try more options.     History includes Type 2 Diabetes (2019), DVT, Bipolar, ADHD, neuropathy, smoking, memory issues, polycythemia, thrombocytopenia, history of DVT/PE, nonproliferative retinopathy Labs noted to include:  A1C 9.1% on 05/20/2024, 8.6% on 02/08/2024, 8.5% 10/08/2022, 7.9% 08/05/2023, 9.4% 03/24/2023  cholesterol 193, Triglycerides 365, HDL 40, LDL 92 08/09/2020 Medications include:Trulicity (stopped), glipizide , actos  (discontinued), Jardiance , Tresiba  70 units q HS (not taking), Novolog  before meals (not taking),  coumadin , Abilify  and other medications that contribute to weight gain. He stopped pioglitizone after 3 days due to stomach cramps. He could not tolerated Metformin  due to bowel incontinence.  CGM:  Libre 3  - 364 after [employer] and 1/2 candy bar and prior to this, it was around 180   CGM Results from download: 02/11/2024 05/13/24 07/29/24  % Time CGM active:   74 %   (Goal >70%) 72 97  Average glucose:   218 mg/dL for 14 days 766 x 14 760 x14  Glucose management indicator:   8.5 % 8.9% 9.2  Time in range (70-180 mg/dL):   76%   (Goal >29%) 21 21  Time High (181-250 mg/dL):   52 %   (Goal < 74%) 47 39  Time Very High (>250 mg/dL):    74%   (Goal < 5%) 32 40  Time Low (54-69 mg/dL):   0 %   (Goal <5%) 0 0  Time Very Low (<54 mg/dL):   0 %   (Goal <8%) 0 0  %CV (glucose variability)     %  (Goal <36%)         Sleep:  Eats late (10pm - midnight).  Goes to sleep late.  Wakes about 10:30 am (although later now) Support:  [facility], patient of Dr. Sam at [facility], PCP Corrina Sabin, MD   Weight: 322 lbs 07/29/2024 312 lbs 10/31-2025 311 lbs 05/13/2024 and 02/11/2024  Patient states he gained weight due to the Glimepiride  (now changed to Glipizide )  300 lbs 10/20/2023 269 lbs 05/29/2023 263 lbs 03/06/2023 262 lbs 12/08/2022 260 lbs 09/08/2022 261 lbs 07/21/2022 263 lbs 04/21/2022 278 lbs 12/19/2021 279 lbs 10/24/2021 282 lbs 08/16/2021 loss likely due to uncontrolled blood glucose 295 lbs 02/14/21 306 lbs 47/2022 306 lbs 11/23/2020 425 lbs highest adult weight 2 years ago (loss presumably due to uncontrolled blood sugar) Lowest adult weight 235 lbs about 8 years ago and he does not recall circumstances of increased weight gain.   Patient lives with his girlfriend.  She has type 2 diabetes.  He does the shopping and cooking.  Dislikes most vegetables. Drinking mostly sugar free options. Likes, green beans, peas, corn and no other  vegetables He is on disability due to mental health.  Finances are a concern. His mother passed away 11-27-20 from complications of diabetes. Girlfriend  goes to the gym 2-3 times per week [facility]).  Patient is not motivated to go. Complains of neuropathy pain which worsens with exercise.  Weight (!) 322 lb (146.1 kg). Body mass index is 43.67 kg/m.   Diabetes Self-Management Education - 08/07/24 2000       Visit Information   Visit Type Follow-up      Psychosocial Assessment   Patient Belief/Attitude about Diabetes Other (comment)    What is the hardest part about your diabetes right now, causing you the most concern, or is the most worrisome to you about your diabetes?   Making healty food and beverage choices;Getting support / problem solving;Taking/obtaining medications    Self-care barriers None    Self-management support Doctor's office    Other persons present Patient    Patient Concerns Nutrition/Meal planning;Monitoring;Problem Solving;Glycemic Control;Weight Control    Special Needs None    Preferred Learning Style No preference indicated    Learning Readiness Not Ready    How often do you need to have someone help you when you read instructions, pamphlets, or other written materials from your doctor or pharmacy? 3 - Sometimes      Pre-Education Assessment   Patient understands the diabetes disease and treatment process. Comprehends key points    Patient understands incorporating nutritional management into lifestyle. Needs Review    Patient undertands incorporating physical activity into lifestyle. Needs Review    Patient understands using medications safely. Needs Review    Patient understands monitoring blood glucose, interpreting and using results Needs Review    Patient understands prevention, detection, and treatment of acute complications. Needs Review    Patient understands prevention, detection, and treatment of chronic complications. Needs Review    Patient  understands how to develop strategies to address psychosocial issues. Needs Review    Patient understands how to develop strategies to promote health/change behavior. Needs Review      Complications   Last HgB A1C per patient/outside source 9.1 %   05/20/2024 increased from 8.6% 02/08/2024   How often do you check your blood sugar? > 4 times/day    Fasting Blood glucose range (mg/dL) 869-820;819-799    Postprandial Blood glucose range (mg/dL) >799    Number of hypoglycemic episodes per month 0    Number of hyperglycemic episodes ( >200mg /dL): Daily    Can you tell when your blood sugar is high? Yes    What do you do if your blood sugar is high? nothing    Have you had a dilated eye exam in the past 12 months? Yes      Dietary Intake   Breakfast 1/2 candy bar, java monster  Snack (morning) sausage croisant, frito chips, mini hashbrowns    Dinner leftover spaghetti and meatballs    Beverage(s) water, diet drinks, regular drinks, java monster      Activity / Exercise   Activity / Exercise Type Light (walking / raking leaves)    How many days per week do you exercise? 2    How many minutes per day do you exercise? 60    Total minutes per week of exercise 120      Patient Education   Previous Diabetes Education Yes    Disease Pathophysiology Explored patient's options for treatment of their diabetes    Healthy Eating Meal options for control of blood glucose level and chronic complications.    Being Active Role of exercise on diabetes management, blood pressure control and cardiac health.    Medications Reviewed patients medication for diabetes, action, purpose, timing of dose and side effects.    Monitoring Taught/evaluated CGM (comment)    Chronic complications Relationship between chronic complications and blood glucose control    Diabetes Stress and Support Identified and addressed patients feelings and concerns about diabetes;Worked with patient to identify barriers to care and  solutions;Role of stress on diabetes    Lifestyle and Health Coping Lifestyle issues that need to be addressed for better diabetes care      Individualized Goals (developed by patient)   Nutrition General guidelines for healthy choices and portions discussed    Physical Activity Exercise 5-7 days per week;30 minutes per day    Medications take my medication as prescribed    Monitoring  Consistenly use CGM    Problem Solving Eating Pattern    Reducing Risk examine blood glucose patterns;do foot checks daily;treat hypoglycemia with 15 grams of carbs if blood glucose less than 70mg /dL      Patient Self-Evaluation of Goals - Patient rates self as meeting previously set goals (% of time)   Nutrition < 25% (hardly ever/never)    Physical Activity 25 - 50% (sometimes)    Medications 25 - 50% (sometimes)    Monitoring >75% (most of the time)    Problem Solving and behavior change strategies  < 25% (hardly ever/never)    Reducing Risk (treating acute and chronic complications) < 25% (hardly ever/never)    Health Coping 25 - 50% (sometimes)      Post-Education Assessment   Patient understands the diabetes disease and treatment process. Comprehends key points    Patient understands incorporating nutritional management into lifestyle. Needs Review    Patient undertands incorporating physical activity into lifestyle. Needs Review    Patient understands using medications safely. Needs Review    Patient understands monitoring blood glucose, interpreting and using results Needs Review    Patient understands prevention, detection, and treatment of acute complications. Needs Review    Patient understands prevention, detection, and treatment of chronic complications. Needs Review    Patient understands how to develop strategies to address psychosocial issues. Needs Review    Patient understands how to develop strategies to promote health/change behavior. Needs Review      Outcomes   Expected Outcomes  Demonstrated interest in learning. Expect positive outcomes    Future DMSE 2 months    Program Status Not Completed      Subsequent Visit   Since your last visit have you continued or begun to take your medications as prescribed? No    Since your last visit have you experienced any weight changes? Gain    Weight Gain (lbs)  10          Individualized Plan for Diabetes Self-Management Training:   Learning Objective:  Patient will have a greater understanding of diabetes self-management. Patient education plan is to attend individual and/or group sessions per assessed needs and concerns.   Plan:   Patient Instructions  Goal:  Go back to the gym or other exercise 2-3 times per week Drink zero sugar beverages. Take your medication as prescribed.  Consider Exercise Videos to do in your home such as Sonny Carota walking videos - 30 minutes most days. Mindfulness:             Consistently scheduled meal - avoid skipping             Choices             Eat slowly             Away from distraction (sitting in kitchen or dining room)             Stop eating when satisfied             Before a snack ask, Am I hungry or eating for another reason?                         What can I do instead if I am not hungry?   Look at your sensor reading before and after you eat.  What is the affect of food on blood glucose?   Diet quality Move to action Drink more water!      Expected Outcomes:  Demonstrated interest in learning. Expect positive outcomes  Education material provided:   If problems or questions, patient to contact team via:  Phone  Future DSME appointment: 2 months

## 2024-08-05 ENCOUNTER — Telehealth: Payer: Self-pay | Admitting: Family Medicine

## 2024-08-05 ENCOUNTER — Other Ambulatory Visit: Payer: Self-pay

## 2024-08-05 NOTE — Telephone Encounter (Signed)
 Pt unconfirmed appt lvm

## 2024-08-07 ENCOUNTER — Encounter: Payer: Self-pay | Admitting: Dietician

## 2024-08-08 ENCOUNTER — Ambulatory Visit: Attending: Family Medicine | Admitting: Family Medicine

## 2024-08-08 ENCOUNTER — Telehealth: Payer: Self-pay | Admitting: Family Medicine

## 2024-08-08 VITALS — BP 114/73 | HR 85 | Temp 97.8°F | Ht 72.0 in | Wt 313.2 lb

## 2024-08-08 DIAGNOSIS — E1165 Type 2 diabetes mellitus with hyperglycemia: Secondary | ICD-10-CM | POA: Diagnosis not present

## 2024-08-08 DIAGNOSIS — E781 Pure hyperglyceridemia: Secondary | ICD-10-CM

## 2024-08-08 DIAGNOSIS — I82502 Chronic embolism and thrombosis of unspecified deep veins of left lower extremity: Secondary | ICD-10-CM

## 2024-08-08 DIAGNOSIS — R062 Wheezing: Secondary | ICD-10-CM

## 2024-08-08 DIAGNOSIS — F3162 Bipolar disorder, current episode mixed, moderate: Secondary | ICD-10-CM

## 2024-08-08 DIAGNOSIS — Z7984 Long term (current) use of oral hypoglycemic drugs: Secondary | ICD-10-CM

## 2024-08-08 DIAGNOSIS — I825Y2 Chronic embolism and thrombosis of unspecified deep veins of left proximal lower extremity: Secondary | ICD-10-CM

## 2024-08-08 DIAGNOSIS — Z6841 Body Mass Index (BMI) 40.0 and over, adult: Secondary | ICD-10-CM | POA: Diagnosis not present

## 2024-08-08 DIAGNOSIS — F17210 Nicotine dependence, cigarettes, uncomplicated: Secondary | ICD-10-CM

## 2024-08-08 DIAGNOSIS — E1142 Type 2 diabetes mellitus with diabetic polyneuropathy: Secondary | ICD-10-CM | POA: Diagnosis not present

## 2024-08-08 DIAGNOSIS — E669 Obesity, unspecified: Secondary | ICD-10-CM | POA: Diagnosis not present

## 2024-08-08 DIAGNOSIS — E1149 Type 2 diabetes mellitus with other diabetic neurological complication: Secondary | ICD-10-CM

## 2024-08-08 DIAGNOSIS — F319 Bipolar disorder, unspecified: Secondary | ICD-10-CM | POA: Diagnosis not present

## 2024-08-08 NOTE — Progress Notes (Signed)
 Subjective:  Patient ID: Bradley [name], male    DOB: Aug 13, 1981  Age: 43 y.o. MRN: [id number]  CC: Medical Management of Chronic Issues     Discussed the use of AI scribe software for clinical note transcription with the patient, who gave verbal consent to proceed.  History of Present Illness Bradley [name] is a 43 year old male with a history of morbid obesity, type 2 diabetes mellitus (A1c 9.4 managed by endocrine, non compliance,diabetic neuropathy (unable to tolerate gabapentin , Lyrica ), Bipolar disorder (he goes to [facility]) , previous DVT in 2017, PE (diagnosed in 10/2018 - on chronic anticoagulation with Coumadin ) who presents for routine follow-up.  He is on Coumadin  for deep vein thrombosis in his leg and PE without any bleeding issues. There is no known family history of blood clots.  His INR is being monitored by the clinical pharmacist.   He manages diabetic neuropathy with daily tramadol . His last hemoglobin A1c was 9.1.  For diabetes he is under the care of endocrinology.  He does not exercise much but plans to start going back to the gym later this week.  He has a history of smoking and experiences occasional mild wheezing.  He is not interested in quitting smoking as he states the holidays his most stressful times of the year resulting in increased smoking.  He has not completed a pulmonary function test previously ordered to evaluate for COPD.  He is under the care of a neuropsychiatric center for bipolar disorder, which is currently stable.  He denies presence of additional concerns.    Past Medical History:  Diagnosis Date   ADHD    ADHD (attention deficit hyperactivity disorder)    Bipolar 1 disorder (HCC)    Bipolar disorder (HCC)    Depression    Diabetes mellitus without complication (HCC)    Hyperlipidemia    Morbid obesity (HCC)    Obesity    Panic attack    Varicose veins of both lower extremities with pain     Past Surgical  History:  Procedure Laterality Date   surgery on meatus as a child      Family History  Problem Relation Age of Onset   Rheum arthritis Mother    Diabetes Mother    Heart failure Mother    Stroke Mother    ADD / ADHD Mother    Anxiety disorder Mother    Arthritis Mother    Depression Mother    Drug abuse Mother    Alcohol  abuse Father    Glaucoma Maternal Grandmother    COPD Maternal Grandmother    Lung cancer Paternal Grandmother        smoker    Social History   Socioeconomic History   Marital status: Single    Spouse name: Not on file   Number of children: Not on file   Years of education: Not on file   Highest education level: Some college, no degree  Occupational History   Not on file  Tobacco Use   Smoking status: Every Day    Current packs/day: 0.50    Average packs/day: 1 pack/day for 15.1 years (15.1 ttl pk-yrs)    Types: Cigarettes, E-cigarettes    Start date: 06/2024   Smokeless tobacco: Never  Vaping Use   Vaping status: Never Used  Substance and Sexual Activity   Alcohol  use: Not Currently    Alcohol /week: 1.0 standard drink of alcohol     Types: 1 Shots of liquor per week  Comment: Only on my birthday and New Year's   Drug use: No   Sexual activity: Not Currently  Other Topics Concern   Not on file  Social History Narrative   ** Merged History Encounter **       Social Drivers of Health   Financial Resource Strain: Low Risk  (08/08/2024)   Overall Financial Resource Strain (CARDIA)    Difficulty of Paying Living Expenses: Not hard at all  Food Insecurity: No Food Insecurity (08/08/2024)   Hunger Vital Sign    Worried About Running Out of Food in the Last Year: Never true    Ran Out of Food in the Last Year: Never true  Transportation Needs: No Transportation Needs (08/08/2024)   PRAPARE - Administrator, Civil Service (Medical): No    Lack of Transportation (Non-Medical): No  Physical Activity: Inactive (08/08/2024)    Exercise Vital Sign    Days of Exercise per Week: 0 days    Minutes of Exercise per Session: Not on file  Stress: No Stress Concern Present (08/08/2024)   [employer] of Occupational Health - Occupational Stress Questionnaire    Feeling of Stress: Only a little  Social Connections: Socially Isolated (08/08/2024)   Social Connection and Isolation Panel    Frequency of Communication with Friends and Family: Three times a week    Frequency of Social Gatherings with Friends and Family: Once a week    Attends Religious Services: Never    Database Administrator or Organizations: No    Attends Engineer, Structural: Not on file    Marital Status: Never married    Allergies  Allergen Reactions   Actos  [Pioglitazone ] Other (See Comments)    Stomach cramps   Gabapentin  Other (See Comments)    Crying spells   Lyrica  [Pregabalin ] Other (See Comments)    Makes the patient somnolent    Outpatient Medications Prior to Visit  Medication Sig Dispense Refill   ARIPiprazole  (ABILIFY ) 5 MG tablet Take 1 tablet (5 mg total) by mouth at bedtime. 30 tablet 2   atorvastatin  (LIPITOR) 20 MG tablet Take 1 tablet (20 mg total) by mouth daily. 90 tablet 3   Continuous Glucose Sensor (FREESTYLE LIBRE 3 PLUS SENSOR) MISC Change sensor every 15 days. 6 each 3   Continuous Glucose Sensor (FREESTYLE LIBRE 3 SENSOR) MISC Place 1 sensor on the skin every 14 days. Use to check glucose continuously 6 each 3   doxepin  (SINEQUAN ) 25 MG capsule Take 25 mg by mouth at bedtime as needed.     doxepin  (SINEQUAN ) 50 MG capsule Take 1 capsule (50 mg total) by mouth as needed for sleep 30 capsule 2   DULoxetine  (CYMBALTA ) 30 MG capsule Take 30 mg by mouth daily.     DULoxetine  (CYMBALTA ) 60 MG capsule Take 1 capsule(s) by mouth at bedtime for anxiety/depression 30 capsule 2   empagliflozin  (JARDIANCE ) 25 MG TABS tablet Take 1 tablet (25 mg total) by mouth daily before breakfast. 90 tablet 3   glipiZIDE   (GLUCOTROL ) 10 MG tablet Take 2 tablets (20 mg total) by mouth 2 (two) times daily before a meal. 360 tablet 3   OneTouch Delica Lancets 33G MISC 1 Device by Does not apply route 3 (three) times daily. 300 each 3   traMADol  (ULTRAM ) 50 MG tablet Take 1 tablet (50 mg total) by mouth every 12 (twelve) hours as needed. 60 tablet 4   warfarin (COUMADIN ) 2.5 MG tablet TAKE 1 TABLET BY  MOUTH DAILY AS DIRECTED BY THE COUMADIN  CLINIC. 90 tablet 1   Blood Glucose Calibration (ACCU-CHEK ACTIVE GLUCOSE CONT VI) by In Vitro route. (Patient not taking: Reported on 08/08/2024)     gentamicin  cream (GARAMYCIN ) 0.1 % Apply to wound left lower extremity once daily until healed. (Patient not taking: Reported on 08/08/2024) 30 g 1   glucose blood (ACCU-CHEK GUIDE) test strip 1 each by Other route 3 (three) times daily. Use as instructed (Patient not taking: Reported on 08/08/2024) 300 each 3   mupirocin  ointment (BACTROBAN ) 2 % Apply to right great toe once daily. (Patient not taking: Reported on 08/08/2024) 30 g 1   sildenafil  (VIAGRA ) 50 MG tablet Take 1 tablet (50 mg total) by mouth daily as needed for erectile dysfunction. At least 24 hours between doses (Patient not taking: Reported on 08/08/2024) 10 tablet 1   No facility-administered medications prior to visit.     ROS Review of Systems  Constitutional:  Negative for activity change and appetite change.  HENT:  Negative for sinus pressure and sore throat.   Respiratory:  Negative for chest tightness, shortness of breath and wheezing.   Cardiovascular:  Negative for chest pain and palpitations.  Gastrointestinal:  Negative for abdominal distention, abdominal pain and constipation.  Genitourinary: Negative.   Musculoskeletal: Negative.   Neurological:  Positive for numbness.  Psychiatric/Behavioral:  Negative for behavioral problems and dysphoric mood.     Objective:  BP 114/73   Pulse 85   Temp 97.8 F (36.6 C) (Oral)   Ht 6' (1.829 m)   Wt (!)  313 lb 3.2 oz (142.1 kg)   SpO2 99%   BMI 42.48 kg/m      08/08/2024    2:32 PM 08/07/2024    8:00 PM 07/05/2024    2:38 PM  BP/Weight  Systolic BP 114  91  Diastolic BP 73  64  Wt. (Lbs) 313.2 322   BMI 42.48 kg/m2 43.67 kg/m2       Physical Exam Constitutional:      Appearance: He is well-developed. He is obese.  Cardiovascular:     Rate and Rhythm: Normal rate.     Heart sounds: Normal heart sounds. No murmur heard. Pulmonary:     Effort: Pulmonary effort is normal.     Breath sounds: Normal breath sounds. No wheezing or rales.  Chest:     Chest wall: No tenderness.  Abdominal:     General: Bowel sounds are normal. There is no distension.     Palpations: Abdomen is soft. There is no mass.     Tenderness: There is no abdominal tenderness.  Musculoskeletal:        General: Normal range of motion.     Right lower leg: No edema.     Left lower leg: No edema.  Neurological:     Mental Status: He is alert and oriented to person, place, and time.  Psychiatric:        Mood and Affect: Mood normal.        Latest Ref Rng & Units 02/09/2024    2:47 PM 08/05/2023    2:57 PM 02/04/2023    8:41 AM  CMP  Glucose 70 - 99 mg/dL 815  788  781   BUN 6 - 24 mg/dL 15  13  14    Creatinine 0.76 - 1.27 mg/dL 8.91  9.03  8.96   Sodium 134 - 144 mmol/L 140  139  137   Potassium 3.5 - 5.2 mmol/L  5.0  4.5  4.4   Chloride 96 - 106 mmol/L 100  99  98   CO2 20 - 29 mmol/L 21  24  21    Calcium  8.7 - 10.2 mg/dL 9.3  9.3  9.2   Total Protein 6.0 - 8.5 g/dL 7.1  7.2  7.0   Total Bilirubin 0.0 - 1.2 mg/dL 0.8  0.9  0.7   Alkaline Phos 44 - 121 IU/L 85  80  74   AST 0 - 40 IU/L 24  24  25    ALT 0 - 44 IU/L 30  33  35     Lipid Panel     Component Value Date/Time   CHOL 126 02/09/2024 1447   TRIG 129 02/09/2024 1447   HDL 46 02/09/2024 1447   CHOLHDL 3.9 12/06/2020 1456   CHOLHDL 4.0 11/12/2016 1613   VLDL 28 11/12/2016 1613   LDLCALC 57 02/09/2024 1447    CBC    Component  Value Date/Time   WBC 9.2 09/24/2022 1249   WBC 9.5 04/19/2020 2357   RBC 6.59 (H) 09/24/2022 1249   HGB 19.0 (H) 09/24/2022 1249   HGB 18.9 (H) 12/06/2020 1456   HCT 55.0 (H) 09/24/2022 1249   HCT 57.0 (H) 12/06/2020 1456   PLT 98 (L) 09/24/2022 1249   PLT 120 (L) 12/06/2020 1456   MCV 83.5 09/24/2022 1249   MCV 84 12/06/2020 1456   MCH 28.8 09/24/2022 1249   MCHC 34.5 09/24/2022 1249   RDW 13.9 09/24/2022 1249   RDW 13.1 12/06/2020 1456   LYMPHSABS 2.2 09/24/2022 1249   LYMPHSABS 2.0 12/06/2020 1456   MONOABS 0.6 09/24/2022 1249   EOSABS 0.2 09/24/2022 1249   EOSABS 0.1 12/06/2020 1456   BASOSABS 0.1 09/24/2022 1249   BASOSABS 0.1 12/06/2020 1456    Lab Results  Component Value Date   HGBA1C 9.1 (A) 05/20/2024    Lab Results  Component Value Date   INR 2.7 06/30/2024   INR 3.1 05/20/2024   INR 2.4 04/15/2024       Assessment & Plan Type 2 diabetes mellitus with hyperglycemia and diabetic peripheral neuropathy Diabetic neuropathy managed with tramadol . A1c at 9.1 indicates poor glycemic control. Kidney function requires reassessment. - Obtained urine microalbumin to assess kidney function. - Continue tramadol  for neuropathy (due to inability to tolerate gabapentin  and Lyrica ) - Follow up with endocrinologist in January for diabetes management. -Counseled on Diabetic diet, the healthy plate, 849 minutes of moderate intensity exercise/week Blood sugar logs with fasting goals of 80-120 mg/dl, random of less than 819 and in the event of sugars less than 60 mg/dl or greater than 599 mg/dl encouraged to notify the clinic. Advised on the need for annual eye exams, annual foot exams, Pneumonia vaccine.   Chronic embolism and thrombosis of deep veins of left lower extremity No bleeding issues with warfarin. Family history of clots uncertain. -Last INR was therapeutic at 2.7 - Continue warfarin therapy. - Continue follow-up with [facility] for INR  monitoring.  Wheezing Wheezing decreased but persists. Smoking history suggests COPD. Pulmonary function test pending. - Sent message to Bernarda to assist with scheduling pulmonary function test. - Provided contact information for scheduling pulmonary function test.   Bipolar 1 disorder - Stable - Management as per [facility]   Hypertriglyceridemia Controlled - Continue statin - Low-cholesterol diet   Obesity Encouraged to continue to work on caloric restriction and increasing physical activity Commended on initiative to resume going to the gym  Nicotine  dependence, cigarettes Continues smoking without plans to quit. Holiday stress may contribute.       No orders of the defined types were placed in this encounter.   Follow-up: Return in about 6 months (around 02/05/2025) for Chronic medical conditions.       Corrina Sabin, MD, FAAFP. [facility] [place], [place] [phone]   08/08/2024, 3:20 PM

## 2024-08-08 NOTE — Telephone Encounter (Signed)
 Confirmed appt for 11/11

## 2024-08-08 NOTE — Patient Instructions (Signed)
 VISIT SUMMARY:  You had a routine follow-up appointment today. We discussed your diabetes management, deep vein thrombosis, wheezing, and smoking habits. We also reviewed your current medications and planned some follow-up tests.  YOUR PLAN:  -TYPE 2 DIABETES MELLITUS WITH HYPERGLYCEMIA AND DIABETIC PERIPHERAL NEUROPATHY: Type 2 diabetes is a condition where your body does not use insulin  properly, leading to high blood sugar levels. Diabetic neuropathy is nerve damage caused by high blood sugar. Your A1c level is 9.1, which indicates poor blood sugar control. We obtained a urine test to check your kidney function and you should continue taking tramadol  for neuropathy. Please follow up with your endocrinologist in January for further diabetes management.  -CHRONIC EMBOLISM AND THROMBOSIS OF DEEP VEINS OF LEFT LOWER EXTREMITY: This condition involves blood clots in the deep veins of your leg. You are currently on warfarin to prevent further clots and have no bleeding issues. Please continue your warfarin therapy and follow up with [facility] for INR monitoring.  -WHEEZING, POSSIBLE CHRONIC OBSTRUCTIVE PULMONARY DISEASE (COPD): Wheezing can be a sign of COPD, a lung condition often caused by smoking. We have sent a message to Bernarda to help schedule a pulmonary function test to better understand your lung health. Please use the contact information provided to schedule this test.  -NICOTINE  DEPENDENCE, CIGARETTES: Nicotine  dependence means you are addicted to smoking cigarettes. Although you do not plan to quit, it is important to be aware that smoking can worsen your health conditions, especially during stressful times like the holidays.  INSTRUCTIONS:  Please follow up with your endocrinologist in January for diabetes management. Continue your warfarin therapy and follow up with [facility] for INR monitoring. Use the contact information provided to schedule your pulmonary function test.

## 2024-08-09 ENCOUNTER — Other Ambulatory Visit: Payer: Self-pay

## 2024-08-09 ENCOUNTER — Ambulatory Visit: Attending: Family Medicine | Admitting: Pharmacist

## 2024-08-09 DIAGNOSIS — I825Y2 Chronic embolism and thrombosis of unspecified deep veins of left proximal lower extremity: Secondary | ICD-10-CM | POA: Diagnosis not present

## 2024-08-09 LAB — POCT INR: POC INR: 2.6

## 2024-08-10 ENCOUNTER — Other Ambulatory Visit: Payer: Self-pay

## 2024-08-10 ENCOUNTER — Ambulatory Visit: Payer: Self-pay | Admitting: Family Medicine

## 2024-08-10 LAB — CMP14+EGFR
ALT: 32 IU/L (ref 0–44)
AST: 21 IU/L (ref 0–40)
Albumin: 4.2 g/dL (ref 4.1–5.1)
Alkaline Phosphatase: 76 IU/L (ref 47–123)
BUN/Creatinine Ratio: 17 (ref 9–20)
BUN: 15 mg/dL (ref 6–24)
Bilirubin Total: 0.6 mg/dL (ref 0.0–1.2)
CO2: 23 mmol/L (ref 20–29)
Calcium: 9.2 mg/dL (ref 8.7–10.2)
Chloride: 99 mmol/L (ref 96–106)
Creatinine, Ser: 0.89 mg/dL (ref 0.76–1.27)
Globulin, Total: 2.5 g/dL (ref 1.5–4.5)
Glucose: 266 mg/dL — ABNORMAL HIGH (ref 70–99)
Potassium: 4.7 mmol/L (ref 3.5–5.2)
Sodium: 138 mmol/L (ref 134–144)
Total Protein: 6.7 g/dL (ref 6.0–8.5)
eGFR: 108 mL/min/1.73 (ref >59)

## 2024-08-10 LAB — MICROALBUMIN / CREATININE URINE RATIO
Creatinine, Urine: 33.2 mg/dL
Microalb/Creat Ratio: 15 mg/g{creat} (ref 0–29)
Microalbumin, Urine: 4.9 ug/mL

## 2024-08-11 NOTE — Progress Notes (Signed)
 [name]                                          MRN: [id number]   08/11/2024   The VBCI Quality Team Specialist reviewed this patient medical record for the purposes of chart review for care gap closure. The following were reviewed: chart review for care gap closure-glycemic status assessment.    VBCI Quality Team

## 2024-08-15 ENCOUNTER — Other Ambulatory Visit: Payer: Self-pay | Admitting: *Deleted

## 2024-08-15 ENCOUNTER — Other Ambulatory Visit: Payer: Self-pay

## 2024-08-15 NOTE — Patient Instructions (Signed)
 Visit Information  Thank you for taking time to visit with me today. Please don't hesitate to contact me if I can be of assistance to you before our next scheduled appointment.  Your next care management appointment is by telephone on 09/20/24 at 1:30 pm  Telephone follow-up in 1 month: 09/20/24 @ 1:30 pm  Please call the care guide team at [phone] if you need to cancel, schedule, or reschedule an appointment.   Please call the Suicide and Crisis Lifeline: 988 call the [place]  National Suicide Prevention Lifeline: 782-016-8205 or TTY: 917 162 7325 TTY 810-399-9122) to talk to a trained counselor call 1-800-273-TALK (toll free, 24 hour hotline) go to [facility] [address] [place] [phone]) call the [facility] Crisis Line: [phone] call 911 if you are experiencing a Mental Health or Behavioral Health Crisis or need someone to talk to.  Keric Zehren, RN, BSN, Theatre Manager [employer] [phone]

## 2024-08-15 NOTE — Patient Outreach (Signed)
 Complex Care Management   Visit Note  08/15/2024  Name:  Bradley [name] MRN: [id number] DOB: 12-Dec-1980  Situation: Referral received for Complex Care Management related to Diabetes I obtained verbal consent from Patient.  Visit completed with Patient  on the phone  Background:   Past Medical History:  Diagnosis Date   ADHD    ADHD (attention deficit hyperactivity disorder)    Bipolar 1 disorder (HCC)    Bipolar disorder (HCC)    Depression    Diabetes mellitus without complication (HCC)    Hyperlipidemia    Morbid obesity (HCC)    Obesity    Panic attack    Varicose veins of both lower extremities with pain     Assessment: Patient Reported Symptoms:  Cognitive Cognitive Status: No symptoms reported, Alert and oriented to person, place, and time, Insightful and able to interpret abstract concepts, Normal speech and language skills, Able to follow simple commands Cognitive/Intellectual Conditions Management [RPT]: None reported or documented in medical history or problem list   Health Maintenance Behaviors: Annual physical exam, Sleep adequate, Healthy diet, Stress management Healing Pattern: Average Health Facilitated by: Stress management, Pain control, Rest  Neurological Neurological Review of Symptoms: No symptoms reported Neurological Management Strategies: Adequate rest, Routine screening Neurological Self-Management Outcome: 4 (good)  HEENT HEENT Symptoms Reported: No symptoms reported HEENT Management Strategies: Adequate rest, Routine screening HEENT Self-Management Outcome: 4 (good)    Cardiovascular Cardiovascular Symptoms Reported: No symptoms reported Does patient have uncontrolled Hypertension?: No Cardiovascular Management Strategies: Adequate rest, Routine screening Cardiovascular Self-Management Outcome: 4 (good)  Respiratory Respiratory Symptoms Reported: No symptoms reported Respiratory Management Strategies: Adequate rest, Routine  screening Respiratory Self-Management Outcome: 4 (good)  Endocrine Endocrine Symptoms Reported: Hyperglycemia Is patient diabetic?: Yes Is patient checking blood sugars at home?: Yes List most recent blood sugar readings, include date and time of day: Patient has Nisource. Patient reports that his Blood sugar today is 321 after he has eaten.  Discuss eating small portions of sweets to prevent large spikes in blood sugar.  We discussed counting carbohydrates with each meal. Endocrine Self-Management Outcome: 3 (uncertain) Endocrine Comment: Patient reports that his blood sugars have ranged from 375 to 200.  Gastrointestinal Gastrointestinal Symptoms Reported: No symptoms reported Gastrointestinal Management Strategies: Adequate rest Gastrointestinal Self-Management Outcome: 4 (good)    Genitourinary Genitourinary Symptoms Reported: No symptoms reported Genitourinary Management Strategies: Adequate rest Genitourinary Self-Management Outcome: 4 (good)  Integumentary Integumentary Symptoms Reported: No symptoms reported Skin Management Strategies: Adequate rest, Routine screening Skin Self-Management Outcome: 4 (good)  Musculoskeletal Musculoskelatal Symptoms Reviewed: Back pain Additional Musculoskeletal Details: Patient reports that he is having pain in his legs today.  He reports that he has pain in his legs bilaterally.  He rates his pain is a 2/10 on pain scale.  Patient reports that he takes tramadol  for pain. Musculoskeletal Management Strategies: Medication therapy, Adequate rest, Routine screening, Weight management Musculoskeletal Self-Management Outcome: 3 (uncertain) Falls in the past year?: Yes (Patient reports no falls since last outreach) Number of falls in past year: 1 or less Fall risk Follow up: Falls evaluation completed  Psychosocial Psychosocial Symptoms Reported: No symptoms reported Additional Psychological Details: Patient has Bipolar.  He is followed by  neuropsychiatry. Behavioral Management Strategies: Support system, Medication therapy Behavioral Health Self-Management Outcome: 4 (good) Major Change/Loss/Stressor/Fears (CP): Denies Techniques to Cope with Loss/Stress/Change: Diversional activities, Counseling, Medication, Support group Quality of Family Relationships: helpful, involved, supportive Do you feel physically threatened by others?: No  08/15/2024    PHQ2-9 Depression Screening   Little interest or pleasure in doing things Several days  Feeling down, depressed, or hopeless Not at all  PHQ-2 - Total Score 1  Trouble falling or staying asleep, or sleeping too much Several days  Feeling tired or having little energy Several days  Poor appetite or overeating  Not at all  Feeling bad about yourself - or that you are a failure or have let yourself or your family down Not at all  Trouble concentrating on things, such as reading the newspaper or watching television Several days  Moving or speaking so slowly that other people could have noticed.  Or the opposite - being so fidgety or restless that you have been moving around a lot more than usual Several days  Thoughts that you would be better off dead, or hurting yourself in some way Not at all  PHQ2-9 Total Score 5  If you checked off any problems, how difficult have these problems made it for you to do your work, take care of things at home, or get along with other people Not difficult at all  Depression Interventions/Treatment Medication, Counseling, Currently on Treatment    There were no vitals filed for this visit. Pain Scale: 0-10 Pain Score: 2  Pain Location: Leg Pain Orientation: Right, Left Pain Descriptors / Indicators: Aching, Throbbing, Tingling Pain Onset: On-going Patients Stated Pain Goal: 2 Pain Intervention(s): Medication (See eMAR)  Medications Reviewed Today     Reviewed by Jorja Nichole LABOR, RN (Case Manager) on 08/15/24 at 1306  Med List Status:  <None>   Medication Order Taking? Sig Documenting Provider Last Dose Status Informant  ARIPiprazole  (ABILIFY ) 5 MG tablet [id number] Yes Take 1 tablet (5 mg total) by mouth at bedtime.   Active   atorvastatin  (LIPITOR) 20 MG tablet [id number] Yes Take 1 tablet (20 mg total) by mouth daily. Newlin, Enobong, MD  Active   Blood Glucose Calibration (ACCU-CHEK ACTIVE GLUCOSE CONT VI) [id number]  by In Vitro route.  Patient not taking: Reported on 08/08/2024   [provider]  Active            Med Note ARBY, CARROLL   Tue Jan 06, 2023  2:55 PM) ONE NORMA is his current monitor.  Continuous Glucose Sensor (FREESTYLE LIBRE 3 PLUS SENSOR) MISC [id number] Yes Change sensor every 15 days. Shamleffer, Donell Cardinal, MD  Active   Continuous Glucose Sensor (FREESTYLE LIBRE 3 SENSOR) [place] [id number] Yes Place 1 sensor on the skin every 14 days. Use to check glucose continuously Shamleffer, Ibtehal Jaralla, MD  Active   doxepin  (SINEQUAN ) 25 MG capsule [id number] Yes Take 25 mg by mouth at bedtime as needed. [provider]  Active   doxepin  (SINEQUAN ) 50 MG capsule [id number] Yes Take 1 capsule (50 mg total) by mouth as needed for sleep   Active   DULoxetine  (CYMBALTA ) 30 MG capsule [id number] Yes Take 30 mg by mouth daily. [provider]  Active   DULoxetine  (CYMBALTA ) 60 MG capsule [id number] Yes Take 1 capsule(s) by mouth at bedtime for anxiety/depression   Active   empagliflozin  (JARDIANCE ) 25 MG TABS tablet [id number] Yes Take 1 tablet (25 mg total) by mouth daily before breakfast. Shamleffer, Ibtehal Jaralla, MD  Active   gentamicin  cream (GARAMYCIN ) 0.1 % [id number]  Apply to wound left lower extremity once daily until healed.  Patient not taking: Reported on 08/15/2024   Gaynel Delon CROME, DPM  Active  Med Note ARBY, CARROLL   Tue Jan 06, 2023  2:56 PM) Not using at this time.  glipiZIDE  (GLUCOTROL ) 10 MG tablet [id number] Yes Take 2 tablets (20 mg total) by mouth 2  (two) times daily before a meal. Shamleffer, Donell Cardinal, MD  Active   glucose blood (ACCU-CHEK GUIDE) test strip [id number]  1 each by Other route 3 (three) times daily. Use as instructed  Patient not taking: Reported on 08/08/2024   Shamleffer, Ibtehal Jaralla, MD  Active            Med Note [facility], CARROLL   Tue Jan 06, 2023  2:57 PM) One touch  mupirocin  ointment (BACTROBAN ) 2 % [id number]  Apply to right great toe once daily.  Patient not taking: Reported on 08/15/2024   Gaynel Delon CROME, DPM  Active   OneTouch Delica Lancets 33G MISC [id number]  1 Device by Does not apply route 3 (three) times daily. Shamleffer, Ibtehal Jaralla, MD  Active   sildenafil  (VIAGRA ) 50 MG tablet [id number]  Take 1 tablet (50 mg total) by mouth daily as needed for erectile dysfunction. At least 24 hours between doses  Patient not taking: Reported on 08/15/2024   Newlin, Enobong, MD  Active            Med Note [facility], CARROLL   Tue Jan 06, 2023  2:59 PM) Would like to have another Rx.  traMADol  (ULTRAM ) 50 MG tablet [id number] Yes Take 1 tablet (50 mg total) by mouth every 12 (twelve) hours as needed. Newlin, Enobong, MD  Active   warfarin (COUMADIN ) 2.5 MG tablet [id number] Yes TAKE 1 TABLET BY MOUTH DAILY AS DIRECTED BY THE COUMADIN  CLINIC. Newlin, Enobong, MD  Active   Med List Note Johnice Powell JULIANNA Bishop [birth year] 2125): [facility] [address]            Recommendation:   PCP Follow-up Specialty provider follow-up Endocrinologist-10/17/24; Podiatry-10/19/24; [facility] Continue Current Plan of Care  Follow Up Plan:   Telephone follow-up in 1 month: 09/20/24 @ 1:30 pm  Livana Yerian, RN, BSN, ACM RN Care Manager [employer] [phone]

## 2024-09-06 ENCOUNTER — Other Ambulatory Visit: Payer: Self-pay

## 2024-09-06 DIAGNOSIS — F4312 Post-traumatic stress disorder, chronic: Secondary | ICD-10-CM | POA: Diagnosis not present

## 2024-09-06 DIAGNOSIS — F411 Generalized anxiety disorder: Secondary | ICD-10-CM | POA: Diagnosis not present

## 2024-09-06 DIAGNOSIS — F3132 Bipolar disorder, current episode depressed, moderate: Secondary | ICD-10-CM | POA: Diagnosis not present

## 2024-09-08 NOTE — Progress Notes (Signed)
 Bradley [name]                                          MRN: [id number]   09/08/2024   The VBCI Quality Team Specialist reviewed this patient medical record for the purposes of chart review for care gap closure. The following were reviewed: chart review for care gap closure-glycemic status assessment.    VBCI Quality Team

## 2024-09-13 ENCOUNTER — Ambulatory Visit: Attending: Family Medicine | Admitting: Pharmacist

## 2024-09-13 DIAGNOSIS — I825Y2 Chronic embolism and thrombosis of unspecified deep veins of left proximal lower extremity: Secondary | ICD-10-CM

## 2024-09-13 LAB — POCT INR: POC INR: 2.8

## 2024-09-20 ENCOUNTER — Other Ambulatory Visit: Payer: Self-pay

## 2024-09-20 ENCOUNTER — Telehealth: Payer: Self-pay | Admitting: *Deleted

## 2024-10-04 ENCOUNTER — Other Ambulatory Visit: Payer: Self-pay

## 2024-10-05 ENCOUNTER — Other Ambulatory Visit (HOSPITAL_COMMUNITY): Payer: Self-pay

## 2024-10-06 ENCOUNTER — Other Ambulatory Visit: Payer: Self-pay

## 2024-10-07 NOTE — Progress Notes (Signed)
 Bradley [name]                                          MRN: [id number]   10/07/2024   The VBCI Quality Team Specialist reviewed this patient medical record for the purposes of chart review for care gap closure. The following were reviewed: chart review for care gap closure-glycemic status assessment.    VBCI Quality Team

## 2024-10-10 ENCOUNTER — Telehealth: Payer: Self-pay

## 2024-10-10 NOTE — Telephone Encounter (Signed)
 Libre needs prior auth.

## 2024-10-12 ENCOUNTER — Other Ambulatory Visit: Payer: Self-pay

## 2024-10-13 ENCOUNTER — Telehealth: Payer: Self-pay | Admitting: Family Medicine

## 2024-10-13 ENCOUNTER — Telehealth: Payer: Self-pay

## 2024-10-13 NOTE — Telephone Encounter (Addendum)
 Spoke with Powell, Nurse for the patients managed HMO/PPO plan. Powell reported that the patients most recent A1C is 9.6. She requested confirmation that the PCP is made aware of this result. Message routed to PCP for review and follow-up.

## 2024-10-13 NOTE — Telephone Encounter (Signed)
 Pharmacy Patient Advocate Encounter   Received notification from Pt Calls Messages that prior authorization for Freestyle libre 3 plus is required/requested.   To help increase the likelihood of successful prior authorization for a continuous glucose monitor, please review the documentation and include evidence of at least two hypoglycemic events (insurers often require glucose values <=54 mg/dL) and/or clear documentation that the patient relies on insulin  to adequately control their diabetes.

## 2024-10-13 NOTE — Telephone Encounter (Signed)
 Received call from [facility] regarding patients critical lab results. Call was transferred to [facility], triage nurse for immediate assistance. Sending this to you as an FYI.

## 2024-10-14 NOTE — Telephone Encounter (Signed)
 Thank you for the heads up.  His diabetes is managed by his endocrinologist; please advise him he needs to schedule an appointment with them to discuss management.

## 2024-10-14 NOTE — Telephone Encounter (Signed)
 Per office note Results statistics:   CGM use % of time 97  Average and SD 240/24.4  Time in range 15%  % Time Above 180 43  % Time above 250 42  % Time Below target 0    Glycemic patterns summary: Hyperglycemia noted throughout the day and night   Hyperglycemic episodes all day and night   Hypoglycemic episodes occurred N/A   Overnight periods: Mostly high

## 2024-10-17 ENCOUNTER — Ambulatory Visit (INDEPENDENT_AMBULATORY_CARE_PROVIDER_SITE_OTHER): Admitting: Internal Medicine

## 2024-10-17 ENCOUNTER — Other Ambulatory Visit: Payer: Self-pay

## 2024-10-17 DIAGNOSIS — E1165 Type 2 diabetes mellitus with hyperglycemia: Secondary | ICD-10-CM | POA: Diagnosis not present

## 2024-10-17 DIAGNOSIS — Z794 Long term (current) use of insulin: Secondary | ICD-10-CM | POA: Diagnosis not present

## 2024-10-17 DIAGNOSIS — E113293 Type 2 diabetes mellitus with mild nonproliferative diabetic retinopathy without macular edema, bilateral: Secondary | ICD-10-CM | POA: Diagnosis not present

## 2024-10-17 DIAGNOSIS — E1142 Type 2 diabetes mellitus with diabetic polyneuropathy: Secondary | ICD-10-CM | POA: Diagnosis not present

## 2024-10-17 LAB — POCT GLYCOSYLATED HEMOGLOBIN (HGB A1C): Hemoglobin A1C: 9.7 % — AB (ref 4.0–5.6)

## 2024-10-17 MED ORDER — TRULICITY 0.75 MG/0.5ML ~~LOC~~ SOAJ
0.7500 mg | SUBCUTANEOUS | 3 refills | Status: AC
  Filled 2024-10-17: qty 6, 84d supply, fill #0

## 2024-10-17 MED ORDER — ACCU-CHEK GUIDE TEST VI STRP
ORAL_STRIP | 12 refills | Status: AC
  Filled 2024-10-17: qty 100, 33d supply, fill #0

## 2024-10-17 MED ORDER — ACCU-CHEK GUIDE W/DEVICE KIT
1.0000 | PACK | Freq: Four times a day (QID) | 0 refills | Status: DC
  Filled 2024-10-17: qty 1, fill #0

## 2024-10-17 MED ORDER — ACCU-CHEK SOFTCLIX LANCETS MISC
1.0000 | Freq: Four times a day (QID) | 3 refills | Status: AC
  Filled 2024-10-17: qty 300, 75d supply, fill #0

## 2024-10-17 MED ORDER — ACCU-CHEK GUIDE TEST VI STRP
ORAL_STRIP | 12 refills | Status: DC
  Filled 2024-10-17: qty 100, fill #0

## 2024-10-17 MED ORDER — ACCU-CHEK GUIDE W/DEVICE KIT
1.0000 | PACK | Freq: Four times a day (QID) | 0 refills | Status: AC
  Filled 2024-10-17: qty 1, 30d supply, fill #0

## 2024-10-17 NOTE — Progress Notes (Signed)
 "   Name: Bradley [name]  Age/ Sex: 44 y.o., male   MRN/ DOB: [id number], [birth year]     PCP: Delbert Clam, MD   Reason for Endocrinology Evaluation: Type 2 Diabetes Mellitus  Initial Endocrine Consultative Visit: 10/18/2020    PATIENT IDENTIFIER: [name] is a 44 y.o. male with a past medical history of T2Dm, DVT and bipolar disorder. The patient has followed with Endocrinology clinic since 10/18/2020 for consultative assistance with management of his diabetes.  DIABETIC HISTORY:  Bradley [name] was diagnosed with DM yrs ago. Basaglar  made him feel sluggish. His hemoglobin A1c has ranged from 7.7% in 2019, peaking at 14.9% in 2021.   On his initial visit to our clinic his A1c was 13.6 %, he DECLINED insulin . He was on Glipizide  and Trulicity , we increased Glipizide , Trulicity  and added Pioglitazone . By the time he went  To see our RD in 12/2020 he was off pioglitazone   Due to abdominal cramps    Stopped Glipizide  and started basal insulin  12/2020  We stop Trulicity  by March 2023 as he was not taking it and he would forget to take it, we will started him on Jardiance  instead as his daily tablet to improve compliance  He self discontinued basal insulin  2024, and declined prandial insulin  as well as CeQur  Attempted to prescribe Rybelsus  but developed side effects 04/2023 and started glimepiride    He is disabled due to mental health   Switch glimepiride  to glipizide  May, 2025 with an A1c of 8.6%  SUBJECTIVE:   During the last visit (06/13/2024): A1c 9.1%   Today (10/17/2024): Bradley [name] is here for a follow up on diabetes management.  He is accompanied by Bradley [name].  He does not check his glucose and has no interest in doing so.  Patient follows with Dr. Valdemar for mild nonproliferative of both eyes He continues to follow-up with our CDE Weight continues to fluctuate No nausea  No constipation  No UTI's   He is working on tobacco cessation, he is down to half a  pack a day   HOME DIABETES REGIMEN:  Jardiance  25 mg daily Glipizide  10 mg, 2 tabs  BID    Statin: no ACE-I/ARB: no Prior Diabetic Education: yes   CONTINUOUS GLUCOSE MONITORING RECORD INTERPRETATION        DIABETIC COMPLICATIONS: Microvascular complications:  Neuropathy, B/L DR  Denies: CKD retinopathy Last Eye Exam: Completed 04/2024  Macrovascular complications:   Denies: CAD, CVA, PVD   HISTORY:  Past Medical History:  Past Medical History:  Diagnosis Date   ADHD    ADHD (attention deficit hyperactivity disorder)    Bipolar 1 disorder (HCC)    Bipolar disorder (HCC)    Depression    Diabetes mellitus without complication (HCC)    Hyperlipidemia    Morbid obesity (HCC)    Obesity    Panic attack    Varicose veins of both lower extremities with pain    Past Surgical History:  Past Surgical History:  Procedure Laterality Date   surgery on meatus as a child     Social History:  reports that he has been smoking cigarettes and e-cigarettes. He started smoking about 3 months ago. He has a 15.2 pack-year smoking history. He has never used smokeless tobacco. He reports that he does not currently use alcohol  after a past usage of about 1.0 standard drink of alcohol  per week. He reports that he does not use drugs. Family History:  Family History  Problem Relation  Age of Onset   Rheum arthritis Mother    Diabetes Mother    Heart failure Mother    Stroke Mother    ADD / ADHD Mother    Anxiety disorder Mother    Arthritis Mother    Depression Mother    Drug abuse Mother    Alcohol  abuse Father    Glaucoma Maternal Grandmother    COPD Maternal Grandmother    Lung cancer Paternal Grandmother        smoker     HOME MEDICATIONS: Allergies as of 10/17/2024       Reactions   Actos  [pioglitazone ] Other (See Comments)   Stomach cramps   Gabapentin  Other (See Comments)   Crying spells   Lyrica  [pregabalin ] Other (See Comments)   Makes the patient somnolent         Medication List        Accurate as of October 17, 2024  3:12 PM. If you have any questions, ask your nurse or doctor.          STOP taking these medications    ACCU-CHEK ACTIVE GLUCOSE CONT VI   gentamicin  cream 0.1 % Commonly known as: GARAMYCIN    mupirocin  ointment 2 % Commonly known as: BACTROBAN    sildenafil  50 MG tablet Commonly known as: Viagra        TAKE these medications    Accu-Chek Guide Test test strip Generic drug: glucose blood Use as instructed What changed:  how much to take how to take this when to take this   Accu-Chek Guide w/Device Kit 1 Device by Does not apply route 4 (four) times daily.   Accu-Chek Softclix Lancets lancets 1 each by Other route 4 (four) times daily. What changed:  how much to take how to take this when to take this   ARIPiprazole  5 MG tablet Commonly known as: ABILIFY  Take 1 tablet (5 mg total) by mouth at bedtime.   atorvastatin  20 MG tablet Commonly known as: LIPITOR Take 1 tablet (20 mg total) by mouth daily.   doxepin  50 MG capsule Commonly known as: SINEQUAN  Take 1 capsule (50 mg total) by mouth as needed for sleep   doxepin  25 MG capsule Commonly known as: SINEQUAN  Take 25 mg by mouth at bedtime as needed.   DULoxetine  30 MG capsule Commonly known as: CYMBALTA  Take 30 mg by mouth daily.   DULoxetine  60 MG capsule Commonly known as: CYMBALTA  Take 1 capsule(s) by mouth at bedtime for anxiety/depression   FreeStyle Libre 3 Sensor Misc Place 1 sensor on the skin every 14 days. Use to check glucose continuously   FreeStyle Libre 3 Plus Sensor Misc Change sensor every 15 days.   glipiZIDE  10 MG tablet Commonly known as: GLUCOTROL  Take 2 tablets (20 mg total) by mouth 2 (two) times daily before a meal.   Jardiance  25 MG Tabs tablet Generic drug: empagliflozin  Take 1 tablet (25 mg total) by mouth daily before breakfast.   traMADol  50 MG tablet Commonly known as: ULTRAM  Take 1 tablet  (50 mg total) by mouth every 12 (twelve) hours as needed.   warfarin 2.5 MG tablet Commonly known as: COUMADIN  Take as directed by the anticoagulation clinic. If you are unsure how to take this medication, talk to your nurse or doctor. Original instructions: TAKE 1 TABLET BY MOUTH DAILY AS DIRECTED BY THE COUMADIN  CLINIC.         OBJECTIVE:   Vital Signs: There were no vitals taken for this visit.  Wt Readings  from Last 3 Encounters:  08/08/24 (!) 313 lb 3.2 oz (142.1 kg)  08/07/24 (!) 322 lb (146.1 kg)  07/05/24 (!) 309 lb (140.2 kg)     Exam: General: Pt appears well and is in NAD  Lungs: Scattered rhonchi   Heart: RRR   Extremities: Trace pretibial edema.  Stasis dermatitis over the shins  Neuro: MS is good with appropriate affect, pt is alert and Ox3     DM foot exam: 07/18/2024 per podiatry      DATA REVIEWED:  Lab Results  Component Value Date   HGBA1C 9.1 (A) 05/20/2024   HGBA1C 8.6 (A) 02/08/2024   HGBA1C 8.6 (A) 11/09/2023    Latest Reference Range & Units 08/08/24 15:15  Sodium 134 - 144 mmol/L 138  Potassium 3.5 - 5.2 mmol/L 4.7  Chloride 96 - 106 mmol/L 99  CO2 20 - 29 mmol/L 23  Glucose 70 - 99 mg/dL 733 (H)  BUN 6 - 24 mg/dL 15  Creatinine 9.23 - 8.72 mg/dL 9.10  Calcium  8.7 - 10.2 mg/dL 9.2  BUN/Creatinine Ratio 9 - 20  17  eGFR >59 mL/min/1.73 108  Alkaline Phosphatase 47 - 123 IU/L 76  Albumin 4.1 - 5.1 g/dL 4.2  AST 0 - 40 IU/L 21  ALT 0 - 44 IU/L 32  Total Protein 6.0 - 8.5 g/dL 6.7  Total Bilirubin 0.0 - 1.2 mg/dL 0.6    Latest Reference Range & Units 08/08/24 15:15  Microalbumin, Urine Not Estab. ug/mL 4.9  MICROALB/CREAT RATIO 0 - 29 mg/g creat 15  Creatinine, Urine Not Estab. mg/dL 66.7      ASSESSMENT / PLAN / RECOMMENDATIONS:   1) Type 2 Diabetes Mellitus, Poorly controlled, With Neuropathic and retinopathic complications - Most recent A1c of 9.7%. Goal A1c < 7.0 %.   -Patient continues with poorly controlled  diabetes -Intolerant to  Rybelsus  -Declines insulin  - I switched him from glimepiride  to glipizide  due to persistent hyperglycemia, will increase glipizide  as below - Pioglitazone  caused abdominal cramps - Patient would like to try Trulicity  again, caution against GI side effects - A prescription for Accu-Chek has been sent to the pharmacy with extra strips - Insurance will not cover CGM technology due to lack of insulin  intake   MEDICATIONS:  Continue glipizide  10 mg, 2 tablets before breakfast and 2 tablets before supper Continue  Jardiance  25 mg daily Start Trulicity  0.75 mg weekly   EDUCATION / INSTRUCTIONS: BG monitoring instructions: Patient is instructed to check his blood sugars 3 times a day, before meals  Call [facility] if: BG persistently < 70  I reviewed the Rule of 15 for the treatment of hypoglycemia in detail with the patient. Literature supplied.    2) Diabetic complications:  Eye: Does not have known diabetic retinopathy.  Neuro/ Feet: Does  have known diabetic peripheral neuropathy  Renal: Patient does not have known baseline CKD. He   is not on an ACEI/ARB at present.     F/U in 3 months   Signed electronically by: Stefano Redgie Butts, MD  [facility] Endocrinology  [facility] [address] [place], [place] [address] Phone: [phone] FAX: [phone]   CC: Delbert Clam, MD [address] [place] [address] [place] [address] Phone: [phone]  Fax: [phone]  Return to Endocrinology clinic as below: Future Appointments  Date Time Provider Department Center  10/18/2024  2:30 PM Fleeta Tonia Garnette LITTIE, RPH-CPP [facility] [address]  10/19/2024  3:00 PM Gaynel Nest  L, DPM [facility]  10/28/2024  3:30 PM McLaurin, Nichole LABOR, RN [facility] None  11/04/2024  1:30 PM Knox Leita CROME, RD [facility]  02/06/2025  2:30 PM Delbert Clam, MD [facility] [address]  02/21/2025  2:10 PM [facility]  [place] VISIT [facility]  05/16/2025 12:45 PM Valdemar Rogue, MD [facility] None    "

## 2024-10-17 NOTE — Patient Instructions (Addendum)
-   Glipizide  10 mg, 2 tablet before Breakfast and 2 tablet before Supper - Continue Jardiance  25 mg, 1 tablet daily  - Start Trulicity  0.75 mg weekly      HOW TO TREAT LOW BLOOD SUGARS (Blood sugar LESS THAN 70 MG/DL) Please follow the RULE OF 15 for the treatment of hypoglycemia treatment (when your (blood sugars are less than 70 mg/dL)   STEP 1: Take 15 grams of carbohydrates when your blood sugar is low, which includes:  3-4 GLUCOSE TABS  OR 3-4 OZ OF JUICE OR REGULAR SODA OR ONE TUBE OF GLUCOSE GEL    STEP 2: RECHECK blood sugar in 15 MINUTES STEP 3: If your blood sugar is still low at the 15 minute recheck --> then, go back to STEP 1 and treat AGAIN with another 15 grams of carbohydrates.

## 2024-10-18 ENCOUNTER — Telehealth: Payer: Self-pay

## 2024-10-18 ENCOUNTER — Ambulatory Visit: Payer: Self-pay | Attending: Family Medicine | Admitting: Pharmacist

## 2024-10-18 ENCOUNTER — Other Ambulatory Visit: Payer: Self-pay

## 2024-10-18 DIAGNOSIS — I825Y2 Chronic embolism and thrombosis of unspecified deep veins of left proximal lower extremity: Secondary | ICD-10-CM | POA: Diagnosis not present

## 2024-10-18 LAB — POCT INR: POC INR: 2.8

## 2024-10-18 NOTE — Telephone Encounter (Signed)
 "  Patient made aware.  "

## 2024-10-18 NOTE — Telephone Encounter (Signed)
 noted

## 2024-10-19 ENCOUNTER — Encounter: Payer: Self-pay | Admitting: Podiatry

## 2024-10-19 ENCOUNTER — Ambulatory Visit: Admitting: Podiatry

## 2024-10-19 DIAGNOSIS — M79675 Pain in left toe(s): Secondary | ICD-10-CM

## 2024-10-19 DIAGNOSIS — E1142 Type 2 diabetes mellitus with diabetic polyneuropathy: Secondary | ICD-10-CM

## 2024-10-19 DIAGNOSIS — M79674 Pain in right toe(s): Secondary | ICD-10-CM

## 2024-10-19 DIAGNOSIS — B351 Tinea unguium: Secondary | ICD-10-CM | POA: Diagnosis not present

## 2024-10-24 NOTE — Progress Notes (Signed)
"  °  Subjective:  Patient ID: Bradley [name], male    DOB: Dec 24, 1980,  MRN: [id number]  Bradley [name] [place] presents to clinic today for preventative diabetic foot care for painful mycotic toenails of both feet that are difficult to trim. Pain interferes with daily activities and wearing enclosed shoe gear comfortably.  Chief Complaint  Patient presents with   [facility]    NIDDM Patient with an A1C of 9.7 presents today for [facility] and nail trim   New problem(s): None.   PCP is Bradley Clam, MD.  Allergies[1]  Review of Systems: Negative except as noted in the HPI.  Objective: There were no vitals filed for this visit. Bradley [name] is a pleasant 44 y.o. male morbidly obese in NAD. AAO x 3.  Vascular Examination: Capillary refill time immediate b/l. Vascular status intact b/l with palpable pedal pulses. Pedal hair absent b/l. No pain with calf compression b/l. Skin temperature gradient WNL b/l. No cyanosis or clubbing b/l. No ischemia or gangrene noted b/l. Trace edema noted BLE. Varicosities present b/l. Evidence of skin changes consistent with long term venous stasis BLE.  Neurological Examination: Sensation grossly intact b/l with 10 gram monofilament. Vibratory sensation intact b/l. Pt has subjective symptoms of neuropathy.  Dermatological Examination:  Pedal skin with normal turgor, texture and tone b/l.  No open wounds. No interdigital macerations.   Toenails 1-5 b/l thick, discolored, elongated with subungual debris and pain on dorsal palpation. No corns or calluses b/l.  Significant improvement in pedal hygiene.  Musculoskeletal Examination: Muscle strength 5/5 to all lower extremity muscle groups bilaterally. No pain, crepitus or joint limitation noted with ROM bilateral LE. No gross bony deformities bilaterally.  Radiographs: None  Assessment/Plan: 1. Pain due to onychomycosis of toenails of both feet   2. Diabetic peripheral neuropathy associated with type 2 diabetes  mellitus [facility])   Consent given for treatment. Patient examined. All patient's and/or POA's questions/concerns addressed on today's visit. Mycotic toenails 1-5 b/l debrided in length and girth without incident. Continue foot and shoe inspections daily. Monitor blood glucose per PCP/Endocrinologist's recommendations.Continue soft, supportive shoe gear daily. Report any pedal injuries to medical professional. Call office if there are any quesitons/concerns. -Patient/POA to call should there be question/concern in the interim.   Return in about 3 months (around 01/17/2025).  Delon LITTIE Merlin, DPM      [place] LOCATION: [address] [place] [address]                   Office [phone]   [facility] LOCATION: [address] [place], [place] [address] Office [phone]     [1]  Allergies Allergen Reactions   Actos  [Pioglitazone ] Other (See Comments)    Stomach cramps   Gabapentin  Other (See Comments)    Crying spells   Lyrica  [Pregabalin ] Other (See Comments)    Makes the patient somnolent   "

## 2024-10-25 ENCOUNTER — Other Ambulatory Visit: Payer: Self-pay | Admitting: Pharmacist

## 2024-10-25 DIAGNOSIS — E1149 Type 2 diabetes mellitus with other diabetic neurological complication: Secondary | ICD-10-CM

## 2024-10-25 NOTE — Telephone Encounter (Signed)
 Pt still with about 10 days of tramadol  but current rxn is without refills. He is requesting that I send request to his provider to make sure everything is taken care of preemptively.

## 2024-10-26 ENCOUNTER — Other Ambulatory Visit: Payer: Self-pay

## 2024-10-26 MED ORDER — TRAMADOL HCL 50 MG PO TABS
50.0000 mg | ORAL_TABLET | Freq: Two times a day (BID) | ORAL | 4 refills | Status: AC | PRN
  Filled 2024-10-26 – 2024-11-01 (×2): qty 60, 30d supply, fill #0

## 2024-10-28 ENCOUNTER — Telehealth: Payer: Self-pay | Admitting: *Deleted

## 2024-11-01 ENCOUNTER — Other Ambulatory Visit: Payer: Self-pay

## 2024-11-04 ENCOUNTER — Encounter: Admitting: Dietician

## 2024-11-29 ENCOUNTER — Ambulatory Visit: Payer: Self-pay | Admitting: Pharmacist

## 2024-12-01 ENCOUNTER — Encounter: Admitting: Dietician

## 2024-12-14 ENCOUNTER — Telehealth: Payer: Self-pay | Admitting: *Deleted

## 2024-12-14 ENCOUNTER — Telehealth: Admitting: *Deleted

## 2025-01-18 ENCOUNTER — Ambulatory Visit: Admitting: Podiatry

## 2025-01-23 ENCOUNTER — Ambulatory Visit: Admitting: Internal Medicine

## 2025-02-06 ENCOUNTER — Ambulatory Visit: Admitting: Family Medicine

## 2025-02-21 ENCOUNTER — Ambulatory Visit

## 2025-05-16 ENCOUNTER — Encounter (INDEPENDENT_AMBULATORY_CARE_PROVIDER_SITE_OTHER): Admitting: Ophthalmology
